# Patient Record
Sex: Female | Born: 1985 | Race: Black or African American | Hispanic: No | Smoking: Former smoker
Health system: Southern US, Community
[De-identification: ages and names within clinical notes are randomized; demographics above are authoritative.]

## PROBLEM LIST (undated history)

## (undated) ENCOUNTER — Ambulatory Visit: Source: Home / Self Care

## (undated) DIAGNOSIS — F419 Anxiety disorder, unspecified: Secondary | ICD-10-CM

## (undated) DIAGNOSIS — T7840XA Allergy, unspecified, initial encounter: Secondary | ICD-10-CM

## (undated) DIAGNOSIS — R112 Nausea with vomiting, unspecified: Secondary | ICD-10-CM

## (undated) DIAGNOSIS — T4145XA Adverse effect of unspecified anesthetic, initial encounter: Secondary | ICD-10-CM

## (undated) DIAGNOSIS — Z5189 Encounter for other specified aftercare: Secondary | ICD-10-CM

## (undated) DIAGNOSIS — G709 Myoneural disorder, unspecified: Secondary | ICD-10-CM

## (undated) DIAGNOSIS — R002 Palpitations: Secondary | ICD-10-CM

## (undated) DIAGNOSIS — F32A Depression, unspecified: Secondary | ICD-10-CM

## (undated) DIAGNOSIS — K219 Gastro-esophageal reflux disease without esophagitis: Secondary | ICD-10-CM

## (undated) DIAGNOSIS — D649 Anemia, unspecified: Secondary | ICD-10-CM

## (undated) DIAGNOSIS — J45909 Unspecified asthma, uncomplicated: Secondary | ICD-10-CM

## (undated) DIAGNOSIS — D689 Coagulation defect, unspecified: Secondary | ICD-10-CM

## (undated) DIAGNOSIS — T8859XA Other complications of anesthesia, initial encounter: Secondary | ICD-10-CM

## (undated) DIAGNOSIS — M1990 Unspecified osteoarthritis, unspecified site: Secondary | ICD-10-CM

## (undated) DIAGNOSIS — R011 Cardiac murmur, unspecified: Secondary | ICD-10-CM

## (undated) DIAGNOSIS — G43909 Migraine, unspecified, not intractable, without status migrainosus: Secondary | ICD-10-CM

## (undated) DIAGNOSIS — Z973 Presence of spectacles and contact lenses: Secondary | ICD-10-CM

## (undated) DIAGNOSIS — G971 Other reaction to spinal and lumbar puncture: Secondary | ICD-10-CM

## (undated) DIAGNOSIS — F4310 Post-traumatic stress disorder, unspecified: Secondary | ICD-10-CM

## (undated) DIAGNOSIS — IMO0002 Reserved for concepts with insufficient information to code with codable children: Secondary | ICD-10-CM

## (undated) DIAGNOSIS — Z9889 Other specified postprocedural states: Secondary | ICD-10-CM

## (undated) DIAGNOSIS — T7422XA Child sexual abuse, confirmed, initial encounter: Secondary | ICD-10-CM

## (undated) DIAGNOSIS — O88219 Thromboembolism in pregnancy, unspecified trimester: Secondary | ICD-10-CM

## (undated) HISTORY — DX: Allergy, unspecified, initial encounter: T78.40XA

## (undated) HISTORY — DX: Post-traumatic stress disorder, unspecified: F43.10

## (undated) HISTORY — DX: Child sexual abuse, confirmed, initial encounter: T74.22XA

## (undated) HISTORY — PX: MANDIBLE SURGERY: SHX707

## (undated) HISTORY — PX: ABDOMINAL HYSTERECTOMY: SHX81

## (undated) HISTORY — DX: Thromboembolism in pregnancy, unspecified trimester: O88.219

## (undated) HISTORY — DX: Anxiety disorder, unspecified: F41.9

## (undated) HISTORY — PX: WISDOM TOOTH EXTRACTION: SHX21

## (undated) HISTORY — DX: Coagulation defect, unspecified: D68.9

## (undated) HISTORY — DX: Depression, unspecified: F32.A

## (undated) HISTORY — DX: Myoneural disorder, unspecified: G70.9

## (undated) HISTORY — DX: Other complications of anesthesia, initial encounter: T88.59XA

## (undated) HISTORY — DX: Reserved for concepts with insufficient information to code with codable children: IMO0002

## (undated) HISTORY — DX: Other reaction to spinal and lumbar puncture: G97.1

## (undated) HISTORY — DX: Encounter for other specified aftercare: Z51.89

## (undated) HISTORY — PX: TUBAL LIGATION: SHX77

---
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
## 2004-08-20 ENCOUNTER — Emergency Department (HOSPITAL_COMMUNITY): Admission: EM | Admit: 2004-08-20 | Discharge: 2004-08-21 | Payer: Self-pay | Admitting: Emergency Medicine

## 2005-02-24 ENCOUNTER — Emergency Department (HOSPITAL_COMMUNITY): Admission: EM | Admit: 2005-02-24 | Discharge: 2005-02-24 | Payer: Self-pay | Admitting: Emergency Medicine

## 2005-05-06 ENCOUNTER — Emergency Department (HOSPITAL_COMMUNITY): Admission: EM | Admit: 2005-05-06 | Discharge: 2005-05-07 | Payer: Self-pay | Admitting: Emergency Medicine

## 2005-06-26 ENCOUNTER — Inpatient Hospital Stay (HOSPITAL_COMMUNITY): Admission: AD | Admit: 2005-06-26 | Discharge: 2005-06-27 | Payer: Self-pay | Admitting: *Deleted

## 2005-06-26 ENCOUNTER — Emergency Department (HOSPITAL_COMMUNITY): Admission: EM | Admit: 2005-06-26 | Discharge: 2005-06-26 | Payer: Self-pay | Admitting: Emergency Medicine

## 2006-02-11 ENCOUNTER — Emergency Department (HOSPITAL_COMMUNITY): Admission: EM | Admit: 2006-02-11 | Discharge: 2006-02-12 | Payer: Self-pay | Admitting: Emergency Medicine

## 2006-04-18 ENCOUNTER — Emergency Department (HOSPITAL_COMMUNITY): Admission: EM | Admit: 2006-04-18 | Discharge: 2006-04-18 | Payer: Self-pay | Admitting: Emergency Medicine

## 2006-06-11 ENCOUNTER — Emergency Department (HOSPITAL_COMMUNITY): Admission: EM | Admit: 2006-06-11 | Discharge: 2006-06-11 | Payer: Self-pay | Admitting: *Deleted

## 2006-06-22 ENCOUNTER — Ambulatory Visit (HOSPITAL_COMMUNITY): Admission: RE | Admit: 2006-06-22 | Discharge: 2006-06-22 | Payer: Self-pay | Admitting: Family Medicine

## 2006-11-27 ENCOUNTER — Ambulatory Visit: Payer: Self-pay | Admitting: *Deleted

## 2006-11-27 ENCOUNTER — Inpatient Hospital Stay (HOSPITAL_COMMUNITY): Admission: AD | Admit: 2006-11-27 | Discharge: 2006-11-29 | Payer: Self-pay | Admitting: Obstetrics & Gynecology

## 2008-12-15 ENCOUNTER — Emergency Department (HOSPITAL_COMMUNITY): Admission: EM | Admit: 2008-12-15 | Discharge: 2008-12-16 | Payer: Self-pay | Admitting: Emergency Medicine

## 2009-08-26 ENCOUNTER — Emergency Department (HOSPITAL_COMMUNITY): Admission: EM | Admit: 2009-08-26 | Discharge: 2009-08-26 | Payer: Self-pay | Admitting: Emergency Medicine

## 2010-03-12 ENCOUNTER — Emergency Department (HOSPITAL_COMMUNITY): Admission: EM | Admit: 2010-03-12 | Discharge: 2010-03-13 | Payer: Self-pay | Admitting: Emergency Medicine

## 2010-10-31 DIAGNOSIS — G971 Other reaction to spinal and lumbar puncture: Secondary | ICD-10-CM

## 2010-10-31 HISTORY — DX: Other reaction to spinal and lumbar puncture: G97.1

## 2010-11-14 ENCOUNTER — Emergency Department (HOSPITAL_COMMUNITY)
Admission: EM | Admit: 2010-11-14 | Discharge: 2010-11-15 | Payer: Self-pay | Source: Home / Self Care | Admitting: Emergency Medicine

## 2010-11-17 LAB — STREP A DNA PROBE: Group A Strep Probe: NEGATIVE

## 2010-11-17 LAB — RAPID STREP SCREEN (MED CTR MEBANE ONLY): Streptococcus, Group A Screen (Direct): NEGATIVE

## 2011-01-18 LAB — GLUCOSE, CAPILLARY: Glucose-Capillary: 79 mg/dL (ref 70–99)

## 2011-01-18 LAB — URINALYSIS, ROUTINE W REFLEX MICROSCOPIC
Bilirubin Urine: NEGATIVE
Glucose, UA: NEGATIVE mg/dL
Ketones, ur: NEGATIVE mg/dL
Nitrite: NEGATIVE
Protein, ur: NEGATIVE mg/dL
Specific Gravity, Urine: 1.027 (ref 1.005–1.030)
Urobilinogen, UA: 0.2 mg/dL (ref 0.0–1.0)
pH: 6.5 (ref 5.0–8.0)

## 2011-01-18 LAB — URINE MICROSCOPIC-ADD ON

## 2011-01-18 LAB — POCT PREGNANCY, URINE: Preg Test, Ur: NEGATIVE

## 2011-01-31 ENCOUNTER — Inpatient Hospital Stay (HOSPITAL_COMMUNITY)
Admission: AD | Admit: 2011-01-31 | Discharge: 2011-02-01 | Disposition: A | Discharge status: Home / Self Care | Payer: Medicaid Other | Source: Ambulatory Visit | Attending: Obstetrics and Gynecology | Admitting: Obstetrics and Gynecology

## 2011-01-31 ENCOUNTER — Inpatient Hospital Stay (HOSPITAL_COMMUNITY): Payer: Medicaid Other

## 2011-01-31 DIAGNOSIS — O99891 Other specified diseases and conditions complicating pregnancy: Secondary | ICD-10-CM

## 2011-01-31 DIAGNOSIS — O9989 Other specified diseases and conditions complicating pregnancy, childbirth and the puerperium: Secondary | ICD-10-CM

## 2011-01-31 DIAGNOSIS — R109 Unspecified abdominal pain: Secondary | ICD-10-CM

## 2011-01-31 LAB — URINALYSIS, ROUTINE W REFLEX MICROSCOPIC
Bilirubin Urine: NEGATIVE
Glucose, UA: NEGATIVE mg/dL
Ketones, ur: NEGATIVE mg/dL
Leukocytes, UA: NEGATIVE
Nitrite: NEGATIVE
Protein, ur: NEGATIVE mg/dL
Specific Gravity, Urine: 1.02 (ref 1.005–1.030)
Urobilinogen, UA: 0.2 mg/dL (ref 0.0–1.0)
pH: 7 (ref 5.0–8.0)

## 2011-01-31 LAB — WET PREP, GENITAL
Trich, Wet Prep: NONE SEEN
Yeast Wet Prep HPF POC: NONE SEEN

## 2011-01-31 LAB — URINE MICROSCOPIC-ADD ON

## 2011-01-31 LAB — HCG, QUANTITATIVE, PREGNANCY: hCG, Beta Chain, Quant, S: 60759 m[IU]/mL — ABNORMAL HIGH (ref <5)

## 2011-01-31 LAB — POCT PREGNANCY, URINE: Preg Test, Ur: POSITIVE

## 2011-02-01 LAB — GC/CHLAMYDIA PROBE AMP, GENITAL
Chlamydia, DNA Probe: NEGATIVE
GC Probe Amp, Genital: NEGATIVE

## 2011-02-15 LAB — POCT I-STAT, CHEM 8
BUN: 11 mg/dL (ref 6–23)
Calcium, Ion: 1.1 mmol/L — ABNORMAL LOW (ref 1.12–1.32)
Creatinine, Ser: 1 mg/dL (ref 0.4–1.2)
TCO2: 21 mmol/L (ref 0–100)

## 2011-02-15 LAB — URINALYSIS, ROUTINE W REFLEX MICROSCOPIC
Bilirubin Urine: NEGATIVE
Glucose, UA: NEGATIVE mg/dL
Hgb urine dipstick: NEGATIVE
Ketones, ur: NEGATIVE mg/dL
Nitrite: NEGATIVE
Protein, ur: NEGATIVE mg/dL
Specific Gravity, Urine: 1.028 (ref 1.005–1.030)
Urobilinogen, UA: 0.2 mg/dL (ref 0.0–1.0)
pH: 6.5 (ref 5.0–8.0)

## 2011-02-15 LAB — URINE CULTURE
Colony Count: NO GROWTH
Culture: NO GROWTH

## 2011-02-15 LAB — COMPREHENSIVE METABOLIC PANEL
ALT: 20 U/L (ref 0–35)
AST: 23 U/L (ref 0–37)
Albumin: 4.1 g/dL (ref 3.5–5.2)
CO2: 25 mEq/L (ref 19–32)
Calcium: 9.2 mg/dL (ref 8.4–10.5)
GFR calc Af Amer: >60 mL/min (ref >60)
Sodium: 137 mEq/L (ref 135–145)
Total Protein: 6.7 g/dL (ref 6.0–8.3)

## 2011-02-15 LAB — CBC
MCHC: 33.3 g/dL (ref 30.0–36.0)
Platelets: 217 10*3/uL (ref 150–400)
RBC: 4.07 MIL/uL (ref 3.87–5.11)
RDW: 13.7 % (ref 11.5–15.5)

## 2011-02-15 LAB — DIFFERENTIAL
Eosinophils Absolute: 0.2 10*3/uL (ref 0.0–0.7)
Eosinophils Relative: 3 % (ref 0–5)
Lymphs Abs: 2.7 10*3/uL (ref 0.7–4.0)
Monocytes Absolute: 0.5 10*3/uL (ref 0.1–1.0)
Monocytes Relative: 7 % (ref 3–12)

## 2011-02-15 LAB — GC/CHLAMYDIA PROBE AMP, GENITAL
Chlamydia, DNA Probe: POSITIVE — AB
GC Probe Amp, Genital: NEGATIVE

## 2011-02-15 LAB — WET PREP, GENITAL: Trich, Wet Prep: NONE SEEN

## 2011-02-15 LAB — URINE MICROSCOPIC-ADD ON

## 2011-02-15 LAB — POCT PREGNANCY, URINE: Preg Test, Ur: NEGATIVE

## 2011-03-05 ENCOUNTER — Inpatient Hospital Stay (HOSPITAL_COMMUNITY)
Admission: AD | Admit: 2011-03-05 | Discharge: 2011-03-05 | Disposition: A | Discharge status: Home / Self Care | Payer: Medicaid Other | Source: Ambulatory Visit | Attending: Obstetrics & Gynecology | Admitting: Obstetrics & Gynecology

## 2011-03-05 DIAGNOSIS — O209 Hemorrhage in early pregnancy, unspecified: Secondary | ICD-10-CM

## 2011-03-05 LAB — WET PREP, GENITAL: Trich, Wet Prep: NONE SEEN

## 2011-03-05 LAB — URINALYSIS, ROUTINE W REFLEX MICROSCOPIC
Bilirubin Urine: NEGATIVE
Nitrite: NEGATIVE
Protein, ur: NEGATIVE mg/dL
Specific Gravity, Urine: <1.005 — ABNORMAL LOW (ref 1.005–1.030)
Urobilinogen, UA: 0.2 mg/dL (ref 0.0–1.0)

## 2011-03-05 LAB — URINE MICROSCOPIC-ADD ON

## 2012-06-09 ENCOUNTER — Emergency Department (HOSPITAL_COMMUNITY): Payer: Self-pay

## 2012-06-09 ENCOUNTER — Emergency Department (HOSPITAL_COMMUNITY)
Admission: EM | Admit: 2012-06-09 | Discharge: 2012-06-09 | Disposition: A | Discharge status: Home / Self Care | Payer: Self-pay | Attending: Emergency Medicine | Admitting: Emergency Medicine

## 2012-06-09 ENCOUNTER — Encounter (HOSPITAL_COMMUNITY): Payer: Self-pay | Admitting: Emergency Medicine

## 2012-06-09 DIAGNOSIS — S93601A Unspecified sprain of right foot, initial encounter: Secondary | ICD-10-CM

## 2012-06-09 DIAGNOSIS — S93699A Other sprain of unspecified foot, initial encounter: Secondary | ICD-10-CM | POA: Insufficient documentation

## 2012-06-09 DIAGNOSIS — S8990XA Unspecified injury of unspecified lower leg, initial encounter: Secondary | ICD-10-CM | POA: Insufficient documentation

## 2012-06-09 DIAGNOSIS — S8991XA Unspecified injury of right lower leg, initial encounter: Secondary | ICD-10-CM

## 2012-06-09 DIAGNOSIS — S99929A Unspecified injury of unspecified foot, initial encounter: Secondary | ICD-10-CM | POA: Insufficient documentation

## 2012-06-09 DIAGNOSIS — W010XXA Fall on same level from slipping, tripping and stumbling without subsequent striking against object, initial encounter: Secondary | ICD-10-CM | POA: Insufficient documentation

## 2012-06-09 MED ORDER — TRAMADOL HCL 50 MG PO TABS: 50.0000 mg | ORAL_TABLET | Freq: Four times a day (QID) | ORAL | Status: AC | PRN

## 2012-06-09 MED ORDER — OXYCODONE-ACETAMINOPHEN 5-325 MG PO TABS
1.0000 | ORAL_TABLET | Freq: Once | ORAL | Status: AC
  Administered 2012-06-09: 1 via ORAL
  Filled 2012-06-09: qty 1

## 2012-06-09 NOTE — ED Notes (Signed)
Pt states she fell on Friday evening and slipped on some water on the floor  Pt states she fell and injured her right foot and knee  Pt states she landed mainly on her right side but her knee and foot broke her fall  Pt states she has some pain in her hip as well  Pt has swelling noted to her right foot and states she is unable to move her toes # 3,4, and 5

## 2012-06-09 NOTE — ED Provider Notes (Signed)
History     CSN: [id number]  Arrival date & time 06/09/12  0047   First MD Initiated Contact with Patient 06/09/12 [phone]      Chief Complaint  Patient presents with  . Fall    (Consider location/radiation/quality/duration/timing/severity/associated sxs/prior treatment) HPI  26 year old female presents for evaluations of right foot injury. Patient reports she accidentally slipped on a wet floor today. She fell forward injuring her right knee and her right foot as it broke the fall. She denies hitting her head or loss of consciousness. She was able to ambulate shortly afterward but complaining of sharp shooting pain from her foot in her knee which radiates up.  Unable to move her toes due to sharp shooting pain. Denies any significant ankle pain. Denies bleeding. Has not tried anything to alleviate her symptoms yet.  History reviewed. No pertinent past medical history.  Past Surgical History  Procedure Date  . Mandible surgery     Family History  Problem Relation Age of Onset  . Hypertension Other   . Diabetes Other   . Hyperlipidemia Other   . Coronary artery disease Other   . Arthritis Other   . Cancer Other     History  Substance Use Topics  . Smoking status: Never Smoker   . Smokeless tobacco: Not on file  . Alcohol Use: No    OB History    Grav Para Term Preterm Abortions TAB SAB Ect Mult Living                  Review of Systems  Constitutional: Negative for fever.  Cardiovascular: Negative for chest pain.  Gastrointestinal: Negative for abdominal pain.  Musculoskeletal: Negative for back pain.  Skin: Negative for wound.  Neurological: Negative for headaches.  All other systems reviewed and are negative.    Allergies  Ibuprofen and Other  Home Medications   Current Outpatient Rx  Name Route Sig Dispense Refill  . ASPIRIN 81 MG PO TABS Oral Take 81 mg by mouth daily.    Marland Kitchen MEDROXYPROGESTERONE ACETATE 150 MG/ML IM SUSP Intramuscular Inject 150 mg  into the muscle every 3 (three) months.      BP 112/70  Pulse 86  Temp 97.8 F (36.6 C) (Oral)  Resp 16  SpO2 99%  Physical Exam  Nursing note and vitals reviewed. Constitutional: She is oriented to person, place, and time. She appears well-developed and well-nourished. No distress.  Eyes: Conjunctivae are normal.  Neck: Normal range of motion. Neck supple.  Musculoskeletal:       Right hip: Normal.       Right knee: She exhibits decreased range of motion. She exhibits no swelling, no effusion, no ecchymosis, no deformity and no laceration. tenderness found. No medial joint line and no lateral joint line tenderness noted.       Right ankle: Normal.       Right foot: She exhibits decreased range of motion, tenderness and bony tenderness. She exhibits no swelling, normal capillary refill and no crepitus.       Feet:  Neurological: She is alert and oriented to person, place, and time.  Skin: Skin is warm. No rash noted.    ED Course  Procedures (including critical care time)  Labs Reviewed - No data to display Dg Foot Complete Right  06/09/2012  *RADIOLOGY REPORT*  Clinical Data: Pain  RIGHT FOOT COMPLETE - 3+ VIEW  Comparison: None.  Findings: No fracture or dislocation of the right foot.  Joint spaces well  maintained  IMPRESSION:  No right foot fracture.  Original Report Authenticated By: Genevive Bi, M.D.  Dg Foot Complete Right  06/09/2012  *RADIOLOGY REPORT*  Clinical Data: Pain  RIGHT FOOT COMPLETE - 3+ VIEW  Comparison: None.  Findings: No fracture or dislocation of the right foot.  Joint spaces well maintained  IMPRESSION:  No right foot fracture.  Original Report Authenticated By: Genevive Bi, M.D.     1. Right foot sprain 2. Right knee injury   MDM  Mechanical fall. Tenderness to third fourth and fifth right toes. Ecchymosis noted to dorsum of 4th toe without deformity. No ankle tenderness. R Knee is nontender to the anterior aspects of patella without  deformity. Normal range of motion.  Patient does not want to ambulate due to pain.  2:39 AM Xray of R foot unremarkable.  Will give knee sleeve, post op boot, and crutches for support.  Pain medication given.         Fayrene Helper, PA-C 06/09/12 [phone]

## 2012-06-10 NOTE — ED Provider Notes (Signed)
Medical screening examination/treatment/procedure(s) were performed by non-physician practitioner and as supervising physician I was immediately available for consultation/collaboration.  Raeford Razor, MD 06/10/12 [phone]

## 2012-10-31 HISTORY — PX: HERNIA REPAIR: SHX51

## 2018-10-31 DIAGNOSIS — O88219 Thromboembolism in pregnancy, unspecified trimester: Secondary | ICD-10-CM

## 2018-10-31 HISTORY — DX: Thromboembolism in pregnancy, unspecified trimester: O88.219

## 2018-10-31 NOTE — L&D Delivery Note (Signed)
OB/GYN Faculty Practice Delivery Note  Katrenia Copenhaver is a 33 y.o. [id number] s/p VD at [redacted]w[redacted]d. She was admitted for IOL for hx of IUFD.   ROM: 0h 29m with clear fluid GBS Status: --Henderson Cloud (12/09 1006) Maximum Maternal Temperature: 98.5 F  Labor Progress: . Patient presented to L&D for IOL for hx of IUFD. Initial SVE: 1/thick/-3. Patient received Foley bulb, Cytotec and Pitocin. Received epidural. SROM occurred. She then progressed to complete.   Delivery Date/Time: 12/31 @ 2032 Delivery: Called to room and patient was complete. Infant delivered within 3 contractions. Head delivered in LOA position. Loose nuchal cord present and easily reduced. Shoulder and body delivered in usual fashion. Infant with spontaneous cry, placed on mother's abdomen, dried and stimulated. Cord clamped x 2 after 1-minute delay, and cut by FOB. Cord blood drawn. Pitocin initiated. Placenta delivered spontaneously with gentle cord traction. Inspected and found to be intact with 3V cord. Continued bleeding noted. Uterine sweep done with small membranes removed. Fundus firm but lower uterine segment boggy. Methergine given. Due to continued bleeding TXA and rectal Cytotec given. Dr. Roselie Awkward called to room to assess as well. Uterine sweep performed by Dr. Roselie Awkward and only clot removed. Placenta inspected again and found to appear intact. 2nd IV started and IVF bolus initiated. Labs drawn. Bakri placed and filled with 500 mL and then packed with one lap thereafter with bleeding stable. Will given second dose TXA, second bag of Pitocin and Methergine series. Will given 1 unit FFP, 2 u RBC's and 1 unit platelets. Will monitor vitals closely and follow-up pending labs.  Baby Weight: pending  Placenta: Sent to L&D Complications: PPH with Bakri placement Lacerations: None EBL: 1995 mL Analgesia: Epidural   Infant: APGAR (1 MIN): 9   APGAR (5 MINS): 9   APGAR (10 MINS):     Barrington Ellison, MD [facility] Family Medicine Fellow,  [facility] 10/31/2019, 9:24 PM

## 2019-03-01 LAB — CYTOLOGY - PAP: Pap Smear: NEGATIVE

## 2019-03-12 LAB — OB RESULTS CONSOLE VARICELLA ZOSTER ANTIBODY, IGG: Varicella: IMMUNE

## 2019-03-12 LAB — OB RESULTS CONSOLE RUBELLA ANTIBODY, IGM: Rubella: IMMUNE

## 2019-03-12 LAB — OB RESULTS CONSOLE ABO/RH: RH Type: POSITIVE

## 2019-03-12 LAB — OB RESULTS CONSOLE ANTIBODY SCREEN: Antibody Screen: NEGATIVE

## 2019-03-12 LAB — OB RESULTS CONSOLE PLATELET COUNT: Platelets: 243

## 2019-03-12 LAB — OB RESULTS CONSOLE GC/CHLAMYDIA
Chlamydia: NEGATIVE
Gonorrhea: NEGATIVE

## 2019-03-12 LAB — HEPATITIS B SURFACE ANTIBODY,QUALITATIVE
Hemoglobin A1C: 4.9
Hepatitis B Virus DNA Qual: REACTIVE

## 2019-03-12 LAB — OB RESULTS CONSOLE HEPATITIS B SURFACE ANTIGEN: Hepatitis B Surface Ag: NEGATIVE

## 2019-03-12 LAB — OB RESULTS CONSOLE HGB/HCT, BLOOD
HCT: 35 (ref 29–41)
Hemoglobin: 12.5

## 2019-03-12 LAB — OB RESULTS CONSOLE RPR: RPR: NONREACTIVE

## 2019-03-12 LAB — OB RESULTS CONSOLE HIV ANTIBODY (ROUTINE TESTING): HIV: NONREACTIVE

## 2019-04-22 LAB — URINE CULTURE: Urine Culture, OB: NEGATIVE

## 2019-07-07 ENCOUNTER — Emergency Department (HOSPITAL_COMMUNITY)
Admission: EM | Admit: 2019-07-07 | Discharge: 2019-07-07 | Disposition: A | Discharge status: Home / Self Care | Payer: Medicaid Other | Attending: Emergency Medicine | Admitting: Emergency Medicine

## 2019-07-07 ENCOUNTER — Other Ambulatory Visit: Payer: Self-pay

## 2019-07-07 ENCOUNTER — Encounter (HOSPITAL_COMMUNITY): Payer: Self-pay

## 2019-07-07 DIAGNOSIS — Z3A22 22 weeks gestation of pregnancy: Secondary | ICD-10-CM | POA: Insufficient documentation

## 2019-07-07 DIAGNOSIS — O9989 Other specified diseases and conditions complicating pregnancy, childbirth and the puerperium: Secondary | ICD-10-CM | POA: Diagnosis present

## 2019-07-07 DIAGNOSIS — R102 Pelvic and perineal pain unspecified side: Secondary | ICD-10-CM

## 2019-07-07 DIAGNOSIS — Z7982 Long term (current) use of aspirin: Secondary | ICD-10-CM | POA: Insufficient documentation

## 2019-07-07 DIAGNOSIS — O26892 Other specified pregnancy related conditions, second trimester: Secondary | ICD-10-CM | POA: Diagnosis not present

## 2019-07-07 DIAGNOSIS — O26899 Other specified pregnancy related conditions, unspecified trimester: Secondary | ICD-10-CM

## 2019-07-07 DIAGNOSIS — Z79899 Other long term (current) drug therapy: Secondary | ICD-10-CM | POA: Diagnosis not present

## 2019-07-07 LAB — CBC WITH DIFFERENTIAL/PLATELET
Abs Immature Granulocytes: 0.07 10*3/uL (ref 0.00–0.07)
Basophils Absolute: 0 10*3/uL (ref 0.0–0.1)
Basophils Relative: 0 %
Eosinophils Absolute: 0.2 10*3/uL (ref 0.0–0.5)
Eosinophils Relative: 2 %
HCT: 33 % — ABNORMAL LOW (ref 36.0–46.0)
Hemoglobin: 11.1 g/dL — ABNORMAL LOW (ref 12.0–15.0)
Immature Granulocytes: 1 %
Lymphocytes Relative: 21 %
Lymphs Abs: 1.7 10*3/uL (ref 0.7–4.0)
MCH: 31.1 pg (ref 26.0–34.0)
MCHC: 33.6 g/dL (ref 30.0–36.0)
MCV: 92.4 fL (ref 80.0–100.0)
Monocytes Absolute: 0.7 10*3/uL (ref 0.1–1.0)
Monocytes Relative: 8 %
Neutro Abs: 5.6 10*3/uL (ref 1.7–7.7)
Neutrophils Relative %: 68 %
Platelets: 154 10*3/uL (ref 150–400)
RBC: 3.57 MIL/uL — ABNORMAL LOW (ref 3.87–5.11)
RDW: 12.8 % (ref 11.5–15.5)
WBC: 8.2 10*3/uL (ref 4.0–10.5)
nRBC: 0 % (ref 0.0–0.2)

## 2019-07-07 LAB — COMPREHENSIVE METABOLIC PANEL
ALT: 11 U/L (ref 0–44)
AST: 14 U/L — ABNORMAL LOW (ref 15–41)
Albumin: 3 g/dL — ABNORMAL LOW (ref 3.5–5.0)
Alkaline Phosphatase: 36 U/L — ABNORMAL LOW (ref 38–126)
Anion gap: 10 (ref 5–15)
BUN: 9 mg/dL (ref 6–20)
CO2: 22 mmol/L (ref 22–32)
Calcium: 9.2 mg/dL (ref 8.9–10.3)
Chloride: 104 mmol/L (ref 98–111)
Creatinine, Ser: 0.47 mg/dL (ref 0.44–1.00)
GFR calc Af Amer: >60 mL/min (ref >60)
GFR calc non Af Amer: >60 mL/min (ref >60)
Glucose, Bld: 93 mg/dL (ref 70–99)
Potassium: 3.7 mmol/L (ref 3.5–5.1)
Sodium: 136 mmol/L (ref 135–145)
Total Bilirubin: 0.2 mg/dL — ABNORMAL LOW (ref 0.3–1.2)
Total Protein: 6.2 g/dL — ABNORMAL LOW (ref 6.5–8.1)

## 2019-07-07 LAB — URINALYSIS, ROUTINE W REFLEX MICROSCOPIC
Bilirubin Urine: NEGATIVE
Glucose, UA: NEGATIVE mg/dL
Ketones, ur: NEGATIVE mg/dL
Leukocytes,Ua: NEGATIVE
Nitrite: NEGATIVE
Protein, ur: NEGATIVE mg/dL
Specific Gravity, Urine: 1.005 (ref 1.005–1.030)
pH: 6 (ref 5.0–8.0)

## 2019-07-07 MED ORDER — SODIUM CHLORIDE 0.9 % IV BOLUS
1000.0000 mL | Freq: Once | INTRAVENOUS | Status: AC
  Administered 2019-07-07: 06:00:00 1000 mL via INTRAVENOUS

## 2019-07-07 MED ORDER — ACETAMINOPHEN 325 MG PO TABS
650.0000 mg | ORAL_TABLET | Freq: Once | ORAL | Status: AC
  Administered 2019-07-07: 650 mg via ORAL
  Filled 2019-07-07: qty 2

## 2019-07-07 NOTE — ED Notes (Signed)
Pt was verbalized discharge instructions. Pt had no further questions at this time. NAD. 

## 2019-07-07 NOTE — Discharge Instructions (Signed)
Please go straight to the MAU assessment at [facility]. We have discussed your case with Dr. Ilda Basset, who recommends that you would benefit with urgent evaluation by them today.

## 2019-07-07 NOTE — ED Triage Notes (Addendum)
Pt reports pelvic pain and pressure that started about an hour ago. Pt is [redacted] weeks pregnant. Denies any bleeding. States that her pregnancy is high risk.

## 2019-07-07 NOTE — ED Provider Notes (Addendum)
[facility] DEPT Provider Note   CSN: [id number] Arrival date & time: 07/07/19  0401     History   Chief Complaint Chief Complaint  Patient presents with  . Pelvic Pain    [redacted] weeks pregnant    HPI Meghan Reed is a 33 y.o. female.     HPI G7, P2 female comes in a chief complaint of pelvic pain.  Patient is about [redacted] weeks pregnant.  She just moved here from [place] and does not have an established OB in [place].  She reports that around 2 AM she woke up with pelvic pain.  The pain is actually generalized over her abdomen anteriorly, but most intense pain is in the pelvic area and it is described as tightness type pain.  She also has some back pain.  She reports that her grandmother recently was in hospice and passed away yesterday, so she has been stressed because of it.  She denies any UTI-like symptoms, fevers, chills, trauma.  Additionally, patient states that early in her pregnancy she had spotting and bleeding.  She is deemed as high risk pregnancy given her early bleeding and also multiple miscarriages and fetal demise.   History reviewed. No pertinent past medical history.  There are no active problems to display for this patient.   Past Surgical History:  Procedure Laterality Date  . MANDIBLE SURGERY       OB History   No obstetric history on file.      Home Medications    Prior to Admission medications   Medication Sig Start Date End Date Taking? Authorizing Provider  acetaminophen (TYLENOL) 500 MG tablet Take 500 mg by mouth every 6 (six) hours as needed for moderate pain.   Yes [provider]  aspirin EC 81 MG tablet Take 81 mg by mouth daily.   Yes [provider]  Prenatal MV-Min-FA-Omega-3 (PRENATAL GUMMIES/DHA & FA) 0.4-32.5 MG CHEW Chew 1 tablet by mouth daily.   Yes [provider]    Family History Family History  Problem Relation Age of Onset  . Hypertension Other   . Diabetes  Other   . Hyperlipidemia Other   . Coronary artery disease Other   . Arthritis Other   . Cancer Other     Social History Social History   Tobacco Use  . Smoking status: Never Smoker  Substance Use Topics  . Alcohol use: No  . Drug use: No     Allergies   Ibuprofen and Other   Review of Systems Review of Systems  Constitutional: Positive for activity change.  Cardiovascular: Negative for chest pain.  Gastrointestinal: Positive for abdominal pain. Negative for nausea and vomiting.  Genitourinary: Negative for dysuria.  Musculoskeletal: Positive for back pain.  All other systems reviewed and are negative.    Physical Exam Updated Vital Signs BP 92/65 (BP Location: Right Arm)   Pulse 68   Temp 98.5 F (36.9 C) (Oral)   Resp 16   Ht 5\' 6"  (1.676 m)   Wt 93 kg   SpO2 100%   BMI 33.09 kg/m   Physical Exam Vitals signs and nursing note reviewed.  Constitutional:      Appearance: She is well-developed.  HENT:     Head: Normocephalic and atraumatic.  Neck:     Musculoskeletal: Normal range of motion and neck supple.  Cardiovascular:     Rate and Rhythm: Normal rate.  Pulmonary:     Effort: Pulmonary effort is normal.  Abdominal:     General: Bowel sounds are normal.     Tenderness: There is abdominal tenderness. There is no guarding or rebound.     Comments: Generalized abdominal tenderness.  Skin:    General: Skin is warm and dry.  Neurological:     Mental Status: She is alert and oriented to person, place, and time.      ED Treatments / Results  Labs (all labs ordered are listed, but only abnormal results are displayed) Labs Reviewed  COMPREHENSIVE METABOLIC PANEL - Abnormal; Notable for the following components:      Result Value   Total Protein 6.2 (*)    Albumin 3.0 (*)    AST 14 (*)    Alkaline Phosphatase 36 (*)    Total Bilirubin 0.2 (*)    All other components within normal limits  CBC WITH DIFFERENTIAL/PLATELET - Abnormal; Notable for  the following components:   RBC 3.57 (*)    Hemoglobin 11.1 (*)    HCT 33.0 (*)    All other components within normal limits  URINALYSIS, ROUTINE W REFLEX MICROSCOPIC - Abnormal; Notable for the following components:   Color, Urine STRAW (*)    Hgb urine dipstick SMALL (*)    Bacteria, UA RARE (*)    All other components within normal limits  TYPE AND SCREEN    EKG None  Radiology No results found.  Procedures Procedures (including critical care time)  Medications Ordered in ED Medications  sodium chloride 0.9 % bolus 1,000 mL (0 mLs Intravenous Stopped 07/07/19 0646)  acetaminophen (TYLENOL) tablet 650 mg (650 mg Oral Given 07/07/19 0530)     Initial Impression / Assessment and Plan / ED Course  I have reviewed the triage vital signs and the nursing notes.  Pertinent labs & imaging results that were available during my care of the patient were reviewed by me and considered in my medical decision making (see chart for details).  Clinical Course as of Jul 06 717  Sun Jul 07, 2019  0633 All of patient's labs are reassuring.  Repeat abdominal exam is unchanged.  Suspect that the pain is secondary to round ligament, however symptoms could be also because of early labor (as she reports she has had similar symptoms in the past prior to her miscarriages and fetal demise) and placental issues.  I spoke with women's faculty on call.  They recommend that it would be best for patient to come to MAU and get further assessment and pelvic exam.  They would not want [place] to proceed with pelvic exam as it would help them get the appropriate swabs and testing.  Recommendations from OB have been discussed with the patient and I have advised him to drive straight to [facility] at [facility].  They concur.   [AN]  0643 Pain is improved to 5 out of 10 at this time.  Hemodynamically she is stable.  All the labs are reassuring.  UA is not showing any concerning findings.  She will be discharged from  [facility] long with a request to go straight to MAU.   [AN]    Clinical Course User Index [AN] Varney Biles, MD       33 year old comes in a chief complaint of pelvic pain.  She is G7, P2 patient was [redacted] weeks pregnant and deemed to be high risk pregnancy.  She reports that her pain started at 2 AM, and is primarily located in the pelvic region.  On exam patient has no  focal abdominal tenderness.  The tenderness appears to be worse than the left quadrants.  Patient does not have any signs of peritonitis.  She denies any vaginal bleeding, and the pain does not appear to be cramping/contraction type pain.  We will get basic labs.  Hydration started.  Patient will be given Tylenol.  Final Clinical Impressions(s) / ED Diagnoses   Final diagnoses:  Pelvic pain in female  Pregnancy related abdominal pain of lower quadrant, antepartum    ED Discharge Orders    None       Varney Biles, MD 07/07/19 [place], Brice, MD 07/07/19 [phone]

## 2019-07-11 ENCOUNTER — Encounter (HOSPITAL_COMMUNITY): Payer: Self-pay

## 2019-07-11 ENCOUNTER — Other Ambulatory Visit: Payer: Self-pay

## 2019-07-11 ENCOUNTER — Inpatient Hospital Stay (HOSPITAL_COMMUNITY)
Admission: AD | Admit: 2019-07-11 | Discharge: 2019-07-11 | Disposition: A | Discharge status: Home / Self Care | Payer: Medicaid Other | Attending: Obstetrics and Gynecology | Admitting: Obstetrics and Gynecology

## 2019-07-11 DIAGNOSIS — M5432 Sciatica, left side: Secondary | ICD-10-CM

## 2019-07-11 DIAGNOSIS — O26892 Other specified pregnancy related conditions, second trimester: Secondary | ICD-10-CM | POA: Diagnosis present

## 2019-07-11 DIAGNOSIS — J45909 Unspecified asthma, uncomplicated: Secondary | ICD-10-CM | POA: Insufficient documentation

## 2019-07-11 DIAGNOSIS — M7918 Myalgia, other site: Secondary | ICD-10-CM | POA: Diagnosis not present

## 2019-07-11 DIAGNOSIS — O99512 Diseases of the respiratory system complicating pregnancy, second trimester: Secondary | ICD-10-CM | POA: Insufficient documentation

## 2019-07-11 DIAGNOSIS — N949 Unspecified condition associated with female genital organs and menstrual cycle: Secondary | ICD-10-CM

## 2019-07-11 DIAGNOSIS — R102 Pelvic and perineal pain: Secondary | ICD-10-CM | POA: Insufficient documentation

## 2019-07-11 DIAGNOSIS — Z3A23 23 weeks gestation of pregnancy: Secondary | ICD-10-CM

## 2019-07-11 DIAGNOSIS — R1031 Right lower quadrant pain: Secondary | ICD-10-CM | POA: Diagnosis not present

## 2019-07-11 DIAGNOSIS — Z87891 Personal history of nicotine dependence: Secondary | ICD-10-CM | POA: Diagnosis not present

## 2019-07-11 DIAGNOSIS — Z7982 Long term (current) use of aspirin: Secondary | ICD-10-CM | POA: Diagnosis not present

## 2019-07-11 HISTORY — DX: Anemia, unspecified: D64.9

## 2019-07-11 HISTORY — DX: Unspecified asthma, uncomplicated: J45.909

## 2019-07-11 LAB — CBC
HCT: 33.9 % — ABNORMAL LOW (ref 36.0–46.0)
Hemoglobin: 11.6 g/dL — ABNORMAL LOW (ref 12.0–15.0)
MCH: 30.9 pg (ref 26.0–34.0)
MCHC: 34.2 g/dL (ref 30.0–36.0)
MCV: 90.4 fL (ref 80.0–100.0)
Platelets: 165 10*3/uL (ref 150–400)
RBC: 3.75 MIL/uL — ABNORMAL LOW (ref 3.87–5.11)
RDW: 12.7 % (ref 11.5–15.5)
WBC: 9.1 10*3/uL (ref 4.0–10.5)
nRBC: 0 % (ref 0.0–0.2)

## 2019-07-11 LAB — URINALYSIS, ROUTINE W REFLEX MICROSCOPIC
Bilirubin Urine: NEGATIVE
Glucose, UA: NEGATIVE mg/dL
Ketones, ur: NEGATIVE mg/dL
Leukocytes,Ua: NEGATIVE
Nitrite: NEGATIVE
Protein, ur: NEGATIVE mg/dL
Specific Gravity, Urine: 1.026 (ref 1.005–1.030)
pH: 6 (ref 5.0–8.0)

## 2019-07-11 LAB — WET PREP, GENITAL
Clue Cells Wet Prep HPF POC: NONE SEEN
Sperm: NONE SEEN
Trich, Wet Prep: NONE SEEN
Yeast Wet Prep HPF POC: NONE SEEN

## 2019-07-11 MED ORDER — CYCLOBENZAPRINE HCL 10 MG PO TABS
10.0000 mg | ORAL_TABLET | Freq: Three times a day (TID) | ORAL | Status: DC | PRN
  Administered 2019-07-11: 10 mg via ORAL
  Filled 2019-07-11: qty 1

## 2019-07-11 MED ORDER — CYCLOBENZAPRINE HCL 10 MG PO TABS: 10.0000 mg | ORAL_TABLET | Freq: Three times a day (TID) | ORAL | 0 refills | Status: DC | PRN

## 2019-07-11 MED ORDER — COMFORT FIT MATERNITY SUPP LG MISC: 1.0000 "application " | Freq: Every day | 0 refills | Status: DC

## 2019-07-11 NOTE — MAU Note (Addendum)
Pt G7P2 at [redacted]w[redacted]d who just moved here from [place]. She has not established prenatal care yet. She is complaining of right-sided lower abdominal/pelvic pain.   Patient states it   feels like "my pelvis is separating" and "my stomach feels tight" She also now feels like she has left-sided sciatic pain.  Denies LOF or VB. Denies cramping or contractions. She was seen at [facility] ED early Sunday morning for this problem. She was told to find an OB or come here to MAU for further evaluation.

## 2019-07-11 NOTE — Discharge Instructions (Signed)
Round Ligament Pain  The round ligament is a cord of muscle and tissue that helps support the uterus. It can become a source of pain during pregnancy if it becomes stretched or twisted as the baby grows. The pain usually begins in the second trimester (13-28 weeks) of pregnancy, and it can come and go until the baby is delivered. It is not a serious problem, and it does not cause harm to the baby. Round ligament pain is usually a short, sharp, and pinching pain, but it can also be a dull, lingering, and aching pain. The pain is felt in the lower side of the abdomen or in the groin. It usually starts deep in the groin and moves up to the outside of the hip area. The pain may occur when you:  Suddenly change position, such as quickly going from a sitting to standing position.  Roll over in bed.  Cough or sneeze.  Do physical activity. Follow these instructions at home:   Watch your condition for any changes.  When the pain starts, relax. Then try any of these methods to help with the pain: ? Sitting down. ? Flexing your knees up to your abdomen. ? Lying on your side with one pillow under your abdomen and another pillow between your legs. ? Sitting in a warm bath for 15-20 minutes or until the pain goes away.  Take over-the-counter and prescription medicines only as told by your health care provider.  Move slowly when you sit down or stand up.  Avoid long walks if they cause pain.  Stop or reduce your physical activities if they cause pain.  Keep all follow-up visits as told by your health care provider. This is important. Contact a health care provider if:  Your pain does not go away with treatment.  You feel pain in your back that you did not have before.  Your medicine is not helping. Get help right away if:  You have a fever or chills.  You develop uterine contractions.  You have vaginal bleeding.  You have nausea or vomiting.  You have diarrhea.  You have pain  when you urinate. Summary  Round ligament pain is felt in the lower abdomen or groin. It is usually a short, sharp, and pinching pain. It can also be a dull, lingering, and aching pain.  This pain usually begins in the second trimester (13-28 weeks). It occurs because the uterus is stretching with the growing baby, and it is not harmful to the baby.  You may notice the pain when you suddenly change position, when you cough or sneeze, or during physical activity.  Relaxing, flexing your knees to your abdomen, lying on one side, or taking a warm bath may help to get rid of the pain.  Get help from your health care provider if the pain does not go away or if you have vaginal bleeding, nausea, vomiting, diarrhea, or painful urination. This information is not intended to replace advice given to you by your health care provider. Make sure you discuss any questions you have with your health care provider. Document Released: 07/26/2008 Document Revised: 04/04/2018 Document Reviewed: 04/04/2018 Elsevier Patient Education  2020 [place].   Sciatica  Sciatica is pain, weakness, tingling, or loss of feeling (numbness) along the sciatic nerve. The sciatic nerve starts in the lower back and goes down the back of each leg. Sciatica usually goes away on its own or with treatment. Sometimes, sciatica may come back (recur). What are the  causes? This condition happens when the sciatic nerve is pinched or has pressure put on it. This may be the result of:  A disk in between the bones of the spine bulging out too far (herniated disk).  Changes in the spinal disks that occur with aging.  A condition that affects a muscle in the butt.  Extra bone growth near the sciatic nerve.  A break (fracture) of the area between your hip bones (pelvis).  Pregnancy.  Tumor. This is rare. What increases the risk? You are more likely to develop this condition if you:  Play sports that put pressure or stress on  the spine.  Have poor strength and ease of movement (flexibility).  Have had a back injury in the past.  Have had back surgery.  Sit for long periods of time.  Do activities that involve bending or lifting over and over again.  Are very overweight (obese). What are the signs or symptoms? Symptoms can vary from mild to very bad. They may include:  Any of these problems in the lower back, leg, hip, or butt: ? Mild tingling, loss of feeling, or dull aches. ? Burning sensations. ? Sharp pains.  Loss of feeling in the back of the calf or the sole of the foot.  Leg weakness.  Very bad back pain that makes it hard to move. These symptoms may get worse when you cough, sneeze, or laugh. They may also get worse when you sit or stand for long periods of time. How is this treated? This condition often gets better without any treatment. However, treatment may include:  Changing or cutting back on physical activity when you have pain.  Doing exercises and stretching.  Putting ice or heat on the affected area.  Medicines that help: ? To relieve pain and swelling. ? To relax your muscles.  Shots (injections) of medicines that help to relieve pain, irritation, and swelling.  Surgery. Follow these instructions at home: Medicines  Take over-the-counter and prescription medicines only as told by your doctor.  Ask your doctor if the medicine prescribed to you: ? Requires you to avoid driving or using heavy machinery. ? Can cause trouble pooping (constipation). You may need to take these steps to prevent or treat trouble pooping:  Drink enough fluids to keep your pee (urine) pale yellow.  Take over-the-counter or prescription medicines.  Eat foods that are high in fiber. These include beans, whole grains, and fresh fruits and vegetables.  Limit foods that are high in fat and sugar. These include fried or sweet foods. Managing pain      If told, put ice on the affected  area. ? Put ice in a plastic bag. ? Place a towel between your skin and the bag. ? Leave the ice on for 20 minutes, 2-3 times a day.  If told, put heat on the affected area. Use the heat source that your doctor tells you to use, such as a moist heat pack or a heating pad. ? Place a towel between your skin and the heat source. ? Leave the heat on for 20-30 minutes. ? Remove the heat if your skin turns bright red. This is very important if you are unable to feel pain, heat, or cold. You may have a greater risk of getting burned. Activity   Return to your normal activities as told by your doctor. Ask your doctor what activities are safe for you.  Avoid activities that make your symptoms worse.  Take short rests  during the day. ? When you rest for a long time, do some physical activity or stretching between periods of rest. ? Avoid sitting for a long time without moving. Get up and move around at least one time each hour.  Exercise and stretch regularly, as told by your doctor.  Do not lift anything that is heavier than 10 lb (4.5 kg) while you have symptoms of sciatica. ? Avoid lifting heavy things even when you do not have symptoms. ? Avoid lifting heavy things over and over.  When you lift objects, always lift in a way that is safe for your body. To do this, you should: ? Bend your knees. ? Keep the object close to your body. ? Avoid twisting. General instructions  Stay at a healthy weight.  Wear comfortable shoes that support your feet. Avoid wearing high heels.  Avoid sleeping on a mattress that is too soft or too hard. You might have less pain if you sleep on a mattress that is firm enough to support your back.  Keep all follow-up visits as told by your doctor. This is important. Contact a doctor if:  You have pain that: ? Wakes you up when you are sleeping. ? Gets worse when you lie down. ? Is worse than the pain you have had in the past. ? Lasts longer than 4  weeks.  You lose weight without trying. Get help right away if:  You cannot control when you pee (urinate) or poop (have a bowel movement).  You have weakness in any of these areas and it gets worse: ? Lower back. ? The area between your hip bones. ? Butt. ? Legs.  You have redness or swelling of your back.  You have a burning feeling when you pee. Summary  Sciatica is pain, weakness, tingling, or loss of feeling (numbness) along the sciatic nerve.  This condition happens when the sciatic nerve is pinched or has pressure put on it.  Sciatica can cause pain, tingling, or loss of feeling (numbness) in the lower back, legs, hips, and butt.  Treatment often includes rest, exercise, medicines, and putting ice or heat on the affected area. This information is not intended to replace advice given to you by your health care provider. Make sure you discuss any questions you have with your health care provider. Document Released: 07/26/2008 Document Revised: 11/05/2018 Document Reviewed: 11/05/2018 Elsevier Patient Education  [place]: You are not alone, Seventy-five percent of women have some sort of abdominal or back pain at some point in their pregnancy. Your baby is growing at a fast pace, which means that your whole body is rapidly trying to adjust to the changes. As your uterus grows, your back may start feeling a bit under stress and this can result in back or abdominal pain that can go from mild, and therefore bearable, to severe pains that will not allow you to sit or lay down comfortably, When it comes to dealing with pregnancy-related pains and cramps, some pregnant women usually prefer natural remedies, which the market is filled with nowadays. For example, wearing a pregnancy support belt can help ease and lessen your discomfort and pain. WHAT ARE THE BENEFITS OF WEARING A PREGNANCY SUPPORT BELT? A pregnancy support belt provides support to the  lower portion of the belly taking some of the weight of the growing uterus and distributing to the other parts of your body. It is designed make you comfortable and gives  you extra support. Over the years, the pregnancy apparel market has been studying the needs and wants of pregnant women and they have come up with the most comfortable pregnancy support belts that woman could ever ask for. In fact, you will no longer have to wear a stretched-out or bulky pregnancy belt that is visible underneath your clothes and makes you feel even more uncomfortable. Nowadays, a pregnancy support belt is made of comfortable and stretchy materials that will not irritate your skin but will actually make you feel at ease and you will not even notice you are wearing it. They are easy to put on and adjust during the day and can be worn at night for additional support.  BENEFITS:  Relives Back pain  Relieves Abdominal Muscle and Leg Pain  Stabilizes the Pelvic Ring  Offers a Cushioned Abdominal Lift Pad  Relieves pressure on the Sciatic Nerve Within Minutes WHERE TO GET YOUR PREGNANCY BELT: [employer] [phone] @[address], [place] [address]

## 2019-07-11 NOTE — MAU Provider Note (Addendum)
History     CSN: [id number]  Arrival date and time: 07/11/19 1353   First Provider Initiated Contact with Patient 07/11/19 1439      Chief Complaint  Patient presents with  . Abdominal Pain   33 y.o. [id number] @23 .1 wks presenting with RLQ pain, sciatica, and low abd pressure. She reports onset 5 days ago. Pain in RLQ is located more in pelvic region and feels deep to the bone. Pain worsens with movement. Also feels constant low abd pressure in center of abd. Reports hx of sciatica, located in left buttock and worsened today. No recent injury or lifting. Rates pain 8/10. She took Tylenol earlier which helped a little. Denies urinary sx. No VB, LOF, or ctx. +FM. She moved here in July from [place]. Reports getting regular care and only complication was bleeding in first trimester. Pt is planning to get care at [facility] History    Gravida  7   Para  3   Term  2   Preterm  1   AB  3   Living  2     SAB  3   TAB      Ectopic      Multiple  1   Live Births  3           Past Medical History:  Diagnosis Date  . Anemia   . Asthma     Past Surgical History:  Procedure Laterality Date  . HERNIA REPAIR  123456   umbilical  . MANDIBLE SURGERY      Family History  Problem Relation Age of Onset  . Hypertension Other   . Diabetes Other   . Hyperlipidemia Other   . Coronary artery disease Other   . Arthritis Other   . Cancer Other     Social History   Tobacco Use  . Smoking status: Former Smoker  Substance Use Topics  . Alcohol use: No  . Drug use: No    Allergies:  Allergies  Allergen Reactions  . Other Swelling    tomato    Medications Prior to Admission  Medication Sig Dispense Refill Last Dose  . acetaminophen (TYLENOL) 500 MG tablet Take 500 mg by mouth every 6 (six) hours as needed for moderate pain.   07/11/2019 at Unknown time  . aspirin EC 81 MG tablet Take 81 mg by mouth daily.   07/10/2019 at Unknown time  . Prenatal MV-Min-FA-Omega-3  (PRENATAL GUMMIES/DHA & FA) 0.4-32.5 MG CHEW Chew 1 tablet by mouth daily.   07/10/2019 at Unknown time    Review of Systems  Constitutional: Negative for chills and fever.  Gastrointestinal: Positive for abdominal pain. Negative for constipation, diarrhea, nausea and vomiting.  Genitourinary: Positive for pelvic pain. Negative for dysuria, frequency, hematuria, vaginal bleeding and vaginal discharge.  Musculoskeletal: Positive for back pain.   Physical Exam   Blood pressure 110/67, pulse (!) 104, temperature 98.5 F (36.9 C), temperature source Oral, resp. rate 20, height 5\' 6"  (1.676 m), weight 93.1 kg, SpO2 99 %.  Physical Exam  Nursing note and vitals reviewed. Constitutional: She is oriented to person, place, and time. She appears well-developed and well-nourished. No distress.  HENT:  Head: Normocephalic and atraumatic.  Neck: Normal range of motion.  Cardiovascular: Normal rate.  Respiratory: Effort normal. No respiratory distress.  GI: Soft. She exhibits no distension and no mass. There is no abdominal tenderness. There is no rebound, no guarding and no CVA tenderness.  gravid  Genitourinary:  Genitourinary Comments: VE: closed/long   Musculoskeletal: Normal range of motion.     Cervical back: Normal.     Thoracic back: Normal.     Lumbar back: She exhibits tenderness (left). She exhibits no edema and no deformity.  Neurological: She is alert and oriented to person, place, and time.  Skin: Skin is warm and dry.  Psychiatric: She has a normal mood and affect.  EFM: 145 bpm, min variability, + accels, no decels Toco: none  Results for orders placed or performed during the hospital encounter of 07/11/19 (from the past 24 hour(s))  Urinalysis, Routine w reflex microscopic     Status: Abnormal   Collection Time: 07/11/19  2:24 PM  Result Value Ref Range   Color, Urine YELLOW YELLOW   APPearance CLEAR CLEAR   Specific Gravity, Urine 1.026 1.005 - 1.030   pH 6.0 5.0 - 8.0    Glucose, UA NEGATIVE NEGATIVE mg/dL   Hgb urine dipstick MODERATE (A) NEGATIVE   Bilirubin Urine NEGATIVE NEGATIVE   Ketones, ur NEGATIVE NEGATIVE mg/dL   Protein, ur NEGATIVE NEGATIVE mg/dL   Nitrite NEGATIVE NEGATIVE   Leukocytes,Ua NEGATIVE NEGATIVE   RBC / HPF 0-5 0 - 5 RBC/hpf   WBC, UA 0-5 0 - 5 WBC/hpf   Bacteria, UA RARE (A) NONE SEEN   Squamous Epithelial / LPF 0-5 0 - 5   Mucus PRESENT   Wet prep, genital     Status: Abnormal   Collection Time: 07/11/19  3:06 PM   Specimen: Vaginal  Result Value Ref Range   Yeast Wet Prep HPF POC NONE SEEN NONE SEEN   Trich, Wet Prep NONE SEEN NONE SEEN   Clue Cells Wet Prep HPF POC NONE SEEN NONE SEEN   WBC, Wet Prep HPF POC FEW (A) NONE SEEN   Sperm NONE SEEN    MAU Course  Procedures Orders Placed This Encounter  Procedures  . Wet prep, genital    Standing Status:   Standing    Number of Occurrences:   1    Order Specific Question:   Patient immune status    Answer:   Normal  . Culture, OB Urine    Standing Status:   Standing    Number of Occurrences:   1  . Urinalysis, Routine w reflex microscopic    Standing Status:   Standing    Number of Occurrences:   1  . CBC    Standing Status:   Standing    Number of Occurrences:   1   Meds ordered this encounter  Medications  . cyclobenzaprine (FLEXERIL) tablet 10 mg   MDM Labs ordered and reviewed. Hgb found in UA, pt reports hx of this, asymptomatic, will send UC. Pain much improved with Flexeril. Discussed other comfort measures including maternity belt, heat, and Tylenol. No signs of PTL. Stable for discharge home.   Assessment and Plan   1. [redacted] weeks gestation of pregnancy   2. Round ligament pain   3. Sciatica of left side   4. Musculoskeletal pain    Discharge home Follow up at [facility] to start [place]- message sent PTL precautions Rx Flexeril Rx maternity belt  Allergies as of 07/11/2019      Reactions   Other Swelling   tomato      Medication List     TAKE these medications   acetaminophen 500 MG tablet Commonly known as: TYLENOL Take 500 mg by mouth every 6 (six) hours as needed for  moderate pain.   aspirin EC 81 MG tablet Take 81 mg by mouth daily.   Comfort Fit Maternity Supp Lg Misc 1 application by Does not apply route daily.   cyclobenzaprine 10 MG tablet Commonly known as: FLEXERIL Take 1 tablet (10 mg total) by mouth 3 (three) times daily as needed for muscle spasms.   Prenatal Gummies/DHA & FA 0.4-32.5 MG Chew Chew 1 tablet by mouth daily.      Julianne Handler, CNM 07/11/2019, 3:08 PM

## 2019-07-12 LAB — CULTURE, OB URINE: Culture: <10000 — AB

## 2019-07-13 LAB — GC/CHLAMYDIA PROBE AMP (~~LOC~~) NOT AT ARMC
Chlamydia: NEGATIVE
Neisseria Gonorrhea: NEGATIVE

## 2019-07-30 ENCOUNTER — Telehealth: Payer: Self-pay | Admitting: Family Medicine

## 2019-07-30 NOTE — Telephone Encounter (Signed)
Spoke to patient about her appointment on 9/30 @ 10:30. Patient instructed that this visit will be a phone visit and she does not have to come to the office for this appointment. Patient instructed a nurse will be call her around her appointment time. Patient instructed to be available around her appointment. Patient verbalized understanding. Patient stated she needs her appointment on 10/7 changed because she can only do Mondays, Wednesdays, Fridays. Patient was rescheduled for 10/12 @ 9:55

## 2019-07-31 ENCOUNTER — Ambulatory Visit (INDEPENDENT_AMBULATORY_CARE_PROVIDER_SITE_OTHER): Payer: Medicaid Other | Admitting: *Deleted

## 2019-07-31 ENCOUNTER — Other Ambulatory Visit: Payer: Self-pay

## 2019-07-31 ENCOUNTER — Encounter: Payer: Self-pay | Admitting: *Deleted

## 2019-07-31 DIAGNOSIS — F4310 Post-traumatic stress disorder, unspecified: Secondary | ICD-10-CM

## 2019-07-31 DIAGNOSIS — J45909 Unspecified asthma, uncomplicated: Secondary | ICD-10-CM | POA: Insufficient documentation

## 2019-07-31 DIAGNOSIS — O09899 Supervision of other high risk pregnancies, unspecified trimester: Secondary | ICD-10-CM

## 2019-07-31 DIAGNOSIS — O0990 Supervision of high risk pregnancy, unspecified, unspecified trimester: Secondary | ICD-10-CM

## 2019-07-31 DIAGNOSIS — T7422XA Child sexual abuse, confirmed, initial encounter: Secondary | ICD-10-CM | POA: Insufficient documentation

## 2019-07-31 DIAGNOSIS — J4530 Mild persistent asthma, uncomplicated: Secondary | ICD-10-CM

## 2019-07-31 DIAGNOSIS — F419 Anxiety disorder, unspecified: Secondary | ICD-10-CM

## 2019-07-31 MED ORDER — BLOOD PRESSURE KIT DEVI: 1.0000 | 0 refills | Status: DC | PRN

## 2019-07-31 NOTE — Progress Notes (Signed)
I connected with  Meghan Reed on 07/31/19 at 10:30 AM EDT by telephone and verified that I am speaking with the correct person using two identifiers.   I discussed the limitations, risks, security and privacy concerns of performing an evaluation and management service by telephone and the availability of in person appointments. I also discussed with the patient that there may be a patient responsible charge related to this service. The patient expressed understanding and agreed to proceed.  Explained I am completing her New OB Intake today. We discussed Her EDD and that it is based on  sure LMP .We have received her transfer records from [place] - she states she also saw a High risk doctor and she will come by the office if possible soon to sign release for those records.  I reviewed her allergies, meds, OB History, Medical /Surgical history, and appropriate screenings. I explained we will send her [employer] app- app sent to her while on phone- she will download after the call because it is slow while on call.  Explained we will send a blood pressure cuff to her and the pharmacy that will send it will call her to verify her address. I asked her to bring the blood pressure cuff with her to her first ob appointment so we can show her how to use it. Explained  then we will have her take her blood pressure weekly and enter into the app. Explained she will have some visits in office and some virtually. I sent her MyChart text to sign up and she downloaded the  MyChart app. Reviewed appointment date/ time with her , our location and to wear mask, no visitors. Explained she will have exam, ob bloodwork if needed- she is agreeable to 2hr gtt/ 28 wk labs and I instructed her to be fasting. She voices understanding.  Nilani Hugill,RN 07/31/2019  10:34 AM

## 2019-08-01 ENCOUNTER — Encounter: Payer: Self-pay | Admitting: Family Medicine

## 2019-08-01 DIAGNOSIS — A6000 Herpesviral infection of urogenital system, unspecified: Secondary | ICD-10-CM | POA: Insufficient documentation

## 2019-08-01 DIAGNOSIS — R87619 Unspecified abnormal cytological findings in specimens from cervix uteri: Secondary | ICD-10-CM | POA: Insufficient documentation

## 2019-08-01 HISTORY — DX: Herpesviral infection of urogenital system, unspecified: A60.00

## 2019-08-01 HISTORY — DX: Unspecified abnormal cytological findings in specimens from cervix uteri: R87.619

## 2019-08-06 ENCOUNTER — Institutional Professional Consult (permissible substitution): Payer: Medicaid Other

## 2019-08-07 ENCOUNTER — Encounter: Payer: Medicaid Other | Admitting: Family Medicine

## 2019-08-08 ENCOUNTER — Ambulatory Visit: Payer: Medicaid Other | Admitting: Clinical

## 2019-08-08 ENCOUNTER — Other Ambulatory Visit: Payer: Self-pay

## 2019-08-08 DIAGNOSIS — Z5329 Procedure and treatment not carried out because of patient's decision for other reasons: Secondary | ICD-10-CM

## 2019-08-08 DIAGNOSIS — Z91199 Patient's noncompliance with other medical treatment and regimen due to unspecified reason: Secondary | ICD-10-CM

## 2019-08-08 NOTE — BH Specialist Note (Signed)
Pt did not arrive to video visit and did not answer the phone; Left HIPPA-compliant message to call back Roselyn Reef from [facility] [employer] [facility] at [phone], and left MyChart message for patient.   [facility] via Telemedicine Video Visit  08/08/2019 Meghan Reed [id number]   Garlan Fair

## 2019-08-12 ENCOUNTER — Other Ambulatory Visit (HOSPITAL_COMMUNITY)
Admission: RE | Admit: 2019-08-12 | Discharge: 2019-08-12 | Disposition: A | Discharge status: Home / Self Care | Payer: Medicaid Other | Source: Ambulatory Visit | Attending: Family Medicine | Admitting: Family Medicine

## 2019-08-12 ENCOUNTER — Encounter: Payer: Self-pay | Admitting: Family Medicine

## 2019-08-12 ENCOUNTER — Ambulatory Visit (INDEPENDENT_AMBULATORY_CARE_PROVIDER_SITE_OTHER): Payer: Medicaid Other | Admitting: Family Medicine

## 2019-08-12 ENCOUNTER — Other Ambulatory Visit: Payer: Self-pay

## 2019-08-12 VITALS — BP 107/71 | HR 125 | Wt 209.1 lb

## 2019-08-12 DIAGNOSIS — Z23 Encounter for immunization: Secondary | ICD-10-CM | POA: Diagnosis not present

## 2019-08-12 DIAGNOSIS — R87618 Other abnormal cytological findings on specimens from cervix uteri: Secondary | ICD-10-CM

## 2019-08-12 DIAGNOSIS — O0992 Supervision of high risk pregnancy, unspecified, second trimester: Secondary | ICD-10-CM

## 2019-08-12 DIAGNOSIS — O09892 Supervision of other high risk pregnancies, second trimester: Secondary | ICD-10-CM

## 2019-08-12 DIAGNOSIS — O0990 Supervision of high risk pregnancy, unspecified, unspecified trimester: Secondary | ICD-10-CM | POA: Diagnosis present

## 2019-08-12 DIAGNOSIS — A6000 Herpesviral infection of urogenital system, unspecified: Secondary | ICD-10-CM

## 2019-08-12 DIAGNOSIS — R3129 Other microscopic hematuria: Secondary | ICD-10-CM | POA: Insufficient documentation

## 2019-08-12 DIAGNOSIS — Z3A27 27 weeks gestation of pregnancy: Secondary | ICD-10-CM

## 2019-08-12 DIAGNOSIS — O09899 Supervision of other high risk pregnancies, unspecified trimester: Secondary | ICD-10-CM

## 2019-08-12 LAB — POCT URINALYSIS DIP (DEVICE)
Bilirubin Urine: NEGATIVE
Glucose, UA: NEGATIVE mg/dL
Ketones, ur: NEGATIVE mg/dL
Leukocytes,Ua: NEGATIVE
Nitrite: NEGATIVE
Protein, ur: 30 mg/dL — AB
Specific Gravity, Urine: 1.02 (ref 1.005–1.030)
Urobilinogen, UA: 0.2 mg/dL (ref 0.0–1.0)
pH: 7 (ref 5.0–8.0)

## 2019-08-12 NOTE — Patient Instructions (Signed)

## 2019-08-12 NOTE — Progress Notes (Signed)
7/22 last U/S
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
## 2019-08-13 LAB — URINALYSIS, ROUTINE W REFLEX MICROSCOPIC
Bilirubin, UA: NEGATIVE
Glucose, UA: NEGATIVE
Ketones, UA: NEGATIVE
Leukocytes,UA: NEGATIVE
Nitrite, UA: NEGATIVE
Specific Gravity, UA: 1.017 (ref 1.005–1.030)
Urobilinogen, Ur: 0.2 mg/dL (ref 0.2–1.0)
pH, UA: 7 (ref 5.0–7.5)

## 2019-08-13 LAB — MICROSCOPIC EXAMINATION: Casts: NONE SEEN /lpf

## 2019-08-13 LAB — CBC
Hematocrit: 33.3 % — ABNORMAL LOW (ref 34.0–46.6)
Hemoglobin: 11.2 g/dL (ref 11.1–15.9)
MCH: 29.5 pg (ref 26.6–33.0)
MCHC: 33.6 g/dL (ref 31.5–35.7)
MCV: 88 fL (ref 79–97)
Platelets: 151 10*3/uL (ref 150–450)
RBC: 3.8 x10E6/uL (ref 3.77–5.28)
RDW: 12.6 % (ref 11.7–15.4)
WBC: 9.4 10*3/uL (ref 3.4–10.8)

## 2019-08-13 LAB — RPR: RPR Ser Ql: NONREACTIVE

## 2019-08-13 LAB — GLUCOSE TOLERANCE, 2 HOURS W/ 1HR
Glucose, 1 hour: 132 mg/dL (ref 65–179)
Glucose, 2 hour: 106 mg/dL (ref 65–152)
Glucose, Fasting: 78 mg/dL (ref 65–91)

## 2019-08-13 LAB — HEMOGLOBIN A1C
Est. average glucose Bld gHb Est-mCnc: 103 mg/dL
Hgb A1c MFr Bld: 5.2 % (ref 4.8–5.6)

## 2019-08-13 LAB — HEPATITIS B SURFACE ANTIGEN: Hepatitis B Surface Ag: NEGATIVE

## 2019-08-13 LAB — HIV ANTIBODY (ROUTINE TESTING W REFLEX): HIV Screen 4th Generation wRfx: NONREACTIVE

## 2019-08-13 LAB — HEPATITIS C ANTIBODY: Hep C Virus Ab: <0.1 s/co ratio (ref 0.0–0.9)

## 2019-08-19 LAB — CERVICOVAGINAL ANCILLARY ONLY
Bacterial Vaginitis (gardnerella): NEGATIVE
Candida Glabrata: NEGATIVE
Candida Vaginitis: NEGATIVE
Chlamydia: NEGATIVE
Comment: NEGATIVE
Comment: NEGATIVE
Comment: NEGATIVE
Comment: NEGATIVE
Comment: NEGATIVE
Comment: NORMAL
Neisseria Gonorrhea: NEGATIVE
Trichomonas: NEGATIVE

## 2019-08-25 ENCOUNTER — Other Ambulatory Visit: Payer: Self-pay

## 2019-08-25 ENCOUNTER — Inpatient Hospital Stay (HOSPITAL_COMMUNITY): Payer: Medicaid Other

## 2019-08-25 ENCOUNTER — Observation Stay (HOSPITAL_COMMUNITY)
Admission: AD | Admit: 2019-08-25 | Discharge: 2019-08-26 | Disposition: A | Discharge status: Home / Self Care | Payer: Medicaid Other | Attending: Obstetrics and Gynecology | Admitting: Obstetrics and Gynecology

## 2019-08-25 ENCOUNTER — Encounter (HOSPITAL_COMMUNITY): Payer: Self-pay

## 2019-08-25 DIAGNOSIS — R0602 Shortness of breath: Secondary | ICD-10-CM | POA: Diagnosis not present

## 2019-08-25 DIAGNOSIS — O26893 Other specified pregnancy related conditions, third trimester: Secondary | ICD-10-CM | POA: Diagnosis present

## 2019-08-25 DIAGNOSIS — O0990 Supervision of high risk pregnancy, unspecified, unspecified trimester: Secondary | ICD-10-CM

## 2019-08-25 DIAGNOSIS — R1011 Right upper quadrant pain: Secondary | ICD-10-CM | POA: Diagnosis present

## 2019-08-25 DIAGNOSIS — J45909 Unspecified asthma, uncomplicated: Secondary | ICD-10-CM | POA: Insufficient documentation

## 2019-08-25 DIAGNOSIS — I2699 Other pulmonary embolism without acute cor pulmonale: Secondary | ICD-10-CM | POA: Diagnosis not present

## 2019-08-25 DIAGNOSIS — Z87891 Personal history of nicotine dependence: Secondary | ICD-10-CM | POA: Diagnosis not present

## 2019-08-25 DIAGNOSIS — Z3A29 29 weeks gestation of pregnancy: Secondary | ICD-10-CM

## 2019-08-25 DIAGNOSIS — Z20828 Contact with and (suspected) exposure to other viral communicable diseases: Secondary | ICD-10-CM | POA: Insufficient documentation

## 2019-08-25 LAB — AMYLASE: Amylase: 56 U/L (ref 28–100)

## 2019-08-25 LAB — CBC WITH DIFFERENTIAL/PLATELET
Abs Immature Granulocytes: 0.16 10*3/uL — ABNORMAL HIGH (ref 0.00–0.07)
Basophils Absolute: 0 10*3/uL (ref 0.0–0.1)
Basophils Relative: 0 %
Eosinophils Absolute: 0.2 10*3/uL (ref 0.0–0.5)
Eosinophils Relative: 2 %
HCT: 31.7 % — ABNORMAL LOW (ref 36.0–46.0)
Hemoglobin: 10.9 g/dL — ABNORMAL LOW (ref 12.0–15.0)
Immature Granulocytes: 2 %
Lymphocytes Relative: 19 %
Lymphs Abs: 1.8 10*3/uL (ref 0.7–4.0)
MCH: 29.9 pg (ref 26.0–34.0)
MCHC: 34.4 g/dL (ref 30.0–36.0)
MCV: 87.1 fL (ref 80.0–100.0)
Monocytes Absolute: 0.9 10*3/uL (ref 0.1–1.0)
Monocytes Relative: 9 %
Neutro Abs: 6.4 10*3/uL (ref 1.7–7.7)
Neutrophils Relative %: 68 %
Platelets: 147 10*3/uL — ABNORMAL LOW (ref 150–400)
RBC: 3.64 MIL/uL — ABNORMAL LOW (ref 3.87–5.11)
RDW: 13 % (ref 11.5–15.5)
WBC: 9.4 10*3/uL (ref 4.0–10.5)
nRBC: 0.2 % (ref 0.0–0.2)

## 2019-08-25 LAB — URINALYSIS, ROUTINE W REFLEX MICROSCOPIC
Bacteria, UA: NONE SEEN
Bilirubin Urine: NEGATIVE
Glucose, UA: NEGATIVE mg/dL
Ketones, ur: NEGATIVE mg/dL
Leukocytes,Ua: NEGATIVE
Nitrite: NEGATIVE
Protein, ur: NEGATIVE mg/dL
Specific Gravity, Urine: 1.004 — ABNORMAL LOW (ref 1.005–1.030)
pH: 7 (ref 5.0–8.0)

## 2019-08-25 LAB — COMPREHENSIVE METABOLIC PANEL
ALT: 10 U/L (ref 0–44)
AST: 12 U/L — ABNORMAL LOW (ref 15–41)
Albumin: 2.7 g/dL — ABNORMAL LOW (ref 3.5–5.0)
Alkaline Phosphatase: 47 U/L (ref 38–126)
Anion gap: 9 (ref 5–15)
BUN: <5 mg/dL — ABNORMAL LOW (ref 6–20)
CO2: 20 mmol/L — ABNORMAL LOW (ref 22–32)
Calcium: 8.9 mg/dL (ref 8.9–10.3)
Chloride: 107 mmol/L (ref 98–111)
Creatinine, Ser: 0.52 mg/dL (ref 0.44–1.00)
GFR calc Af Amer: >60 mL/min (ref >60)
GFR calc non Af Amer: >60 mL/min (ref >60)
Glucose, Bld: 71 mg/dL (ref 70–99)
Potassium: 3.7 mmol/L (ref 3.5–5.1)
Sodium: 136 mmol/L (ref 135–145)
Total Bilirubin: <0.1 mg/dL — ABNORMAL LOW (ref 0.3–1.2)
Total Protein: 6 g/dL — ABNORMAL LOW (ref 6.5–8.1)

## 2019-08-25 LAB — TYPE AND SCREEN
ABO/RH(D): B POS
Antibody Screen: NEGATIVE

## 2019-08-25 LAB — ABO/RH: ABO/RH(D): B POS

## 2019-08-25 LAB — TROPONIN I (HIGH SENSITIVITY): Troponin I (High Sensitivity): 4 ng/L (ref <18)

## 2019-08-25 LAB — SARS CORONAVIRUS 2 BY RT PCR (HOSPITAL ORDER, PERFORMED IN ~~LOC~~ HOSPITAL LAB): SARS Coronavirus 2: NEGATIVE

## 2019-08-25 LAB — LIPASE, BLOOD: Lipase: 30 U/L (ref 11–51)

## 2019-08-25 MED ORDER — LACTATED RINGERS IV BOLUS
1000.0000 mL | Freq: Once | INTRAVENOUS | Status: AC
  Administered 2019-08-25: 1000 mL via INTRAVENOUS

## 2019-08-25 MED ORDER — HYDROMORPHONE HCL 1 MG/ML IJ SOLN
1.0000 mg | Freq: Once | INTRAMUSCULAR | Status: AC
  Administered 2019-08-25: 1 mg via INTRAVENOUS
  Filled 2019-08-25: qty 1

## 2019-08-25 MED ORDER — ACETAMINOPHEN 325 MG PO TABS
650.0000 mg | ORAL_TABLET | ORAL | Status: DC | PRN
  Administered 2019-08-26: 650 mg via ORAL
  Filled 2019-08-25: qty 2

## 2019-08-25 MED ORDER — PRENATAL MULTIVITAMIN CH
1.0000 | ORAL_TABLET | Freq: Every day | ORAL | Status: DC
  Administered 2019-08-26: 1 via ORAL
  Filled 2019-08-25: qty 1

## 2019-08-25 MED ORDER — ONDANSETRON 4 MG PO TBDP
8.0000 mg | ORAL_TABLET | Freq: Once | ORAL | Status: AC
  Administered 2019-08-25: 8 mg via ORAL
  Filled 2019-08-25: qty 2

## 2019-08-25 MED ORDER — LACTATED RINGERS IV SOLN
INTRAVENOUS | Status: DC
  Administered 2019-08-25 – 2019-08-26 (×3): via INTRAVENOUS

## 2019-08-25 MED ORDER — SIMETHICONE 80 MG PO CHEW
80.0000 mg | CHEWABLE_TABLET | Freq: Once | ORAL | Status: AC
  Administered 2019-08-25: 80 mg via ORAL
  Filled 2019-08-25: qty 1

## 2019-08-25 MED ORDER — HYDROMORPHONE HCL 1 MG/ML IJ SOLN
0.5000 mg | INTRAMUSCULAR | Status: DC | PRN
  Administered 2019-08-25 – 2019-08-26 (×3): 0.5 mg via INTRAVENOUS
  Filled 2019-08-25 (×3): qty 0.5

## 2019-08-25 MED ORDER — PANTOPRAZOLE SODIUM 40 MG PO TBEC
40.0000 mg | DELAYED_RELEASE_TABLET | Freq: Every day | ORAL | Status: DC
  Administered 2019-08-25 – 2019-08-26 (×2): 40 mg via ORAL
  Filled 2019-08-25 (×2): qty 1

## 2019-08-25 MED ORDER — CALCIUM CARBONATE ANTACID 500 MG PO CHEW: 2.0000 | CHEWABLE_TABLET | ORAL | Status: DC | PRN

## 2019-08-25 MED ORDER — ZOLPIDEM TARTRATE 5 MG PO TABS
5.0000 mg | ORAL_TABLET | Freq: Every evening | ORAL | Status: DC | PRN
  Filled 2019-08-25: qty 1

## 2019-08-25 MED ORDER — PROMETHAZINE HCL 25 MG/ML IJ SOLN
25.0000 mg | Freq: Once | INTRAMUSCULAR | Status: AC
  Administered 2019-08-25: 25 mg via INTRAVENOUS
  Filled 2019-08-25: qty 1

## 2019-08-25 MED ORDER — DOCUSATE SODIUM 100 MG PO CAPS
100.0000 mg | ORAL_CAPSULE | Freq: Every day | ORAL | Status: DC
  Administered 2019-08-26: 10:00:00 100 mg via ORAL
  Filled 2019-08-25: qty 1

## 2019-08-25 NOTE — MAU Note (Signed)
Meghan Reed is a 33 y.o. at [redacted]w[redacted]d here in MAU reporting: stabbing pain in her RUQ under right breast that radiates to right shoulder. States it is getting worse. Started yesterday. Feels a little bit short of breath. Having some BH contractions but no bleeding or LOF. +FM  Onset of complaint: yesterday but getting worse  Pain score: 9/10  Vitals:   08/25/19 1132  BP: 99/67  Pulse: (!) 105  Resp: (!) 32  Temp: 98.4 F (36.9 C)     FHT: +FM  Lab orders placed from triage: UA
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
## 2019-08-26 ENCOUNTER — Encounter: Payer: Self-pay | Admitting: Family Medicine

## 2019-08-26 ENCOUNTER — Inpatient Hospital Stay (HOSPITAL_COMMUNITY): Payer: Medicaid Other

## 2019-08-26 ENCOUNTER — Encounter: Payer: Medicaid Other | Admitting: Family Medicine

## 2019-08-26 ENCOUNTER — Encounter (HOSPITAL_COMMUNITY): Payer: Self-pay

## 2019-08-26 ENCOUNTER — Observation Stay (HOSPITAL_BASED_OUTPATIENT_CLINIC_OR_DEPARTMENT_OTHER): Payer: Medicaid Other

## 2019-08-26 ENCOUNTER — Encounter: Payer: Self-pay | Admitting: Obstetrics and Gynecology

## 2019-08-26 DIAGNOSIS — M7989 Other specified soft tissue disorders: Secondary | ICD-10-CM

## 2019-08-26 DIAGNOSIS — Z3A29 29 weeks gestation of pregnancy: Secondary | ICD-10-CM | POA: Diagnosis not present

## 2019-08-26 DIAGNOSIS — M79609 Pain in unspecified limb: Secondary | ICD-10-CM

## 2019-08-26 DIAGNOSIS — R1011 Right upper quadrant pain: Secondary | ICD-10-CM | POA: Diagnosis not present

## 2019-08-26 DIAGNOSIS — I2699 Other pulmonary embolism without acute cor pulmonale: Secondary | ICD-10-CM

## 2019-08-26 DIAGNOSIS — I2693 Single subsegmental pulmonary embolism without acute cor pulmonale: Secondary | ICD-10-CM

## 2019-08-26 DIAGNOSIS — O26893 Other specified pregnancy related conditions, third trimester: Secondary | ICD-10-CM | POA: Diagnosis not present

## 2019-08-26 DIAGNOSIS — R0602 Shortness of breath: Secondary | ICD-10-CM | POA: Diagnosis not present

## 2019-08-26 LAB — CBC
HCT: 27.8 % — ABNORMAL LOW (ref 36.0–46.0)
Hemoglobin: 9.3 g/dL — ABNORMAL LOW (ref 12.0–15.0)
MCH: 29.7 pg (ref 26.0–34.0)
MCHC: 33.5 g/dL (ref 30.0–36.0)
MCV: 88.8 fL (ref 80.0–100.0)
Platelets: 142 10*3/uL — ABNORMAL LOW (ref 150–400)
RBC: 3.13 MIL/uL — ABNORMAL LOW (ref 3.87–5.11)
RDW: 13.3 % (ref 11.5–15.5)
WBC: 7.7 10*3/uL (ref 4.0–10.5)
nRBC: 0.3 % — ABNORMAL HIGH (ref 0.0–0.2)

## 2019-08-26 LAB — COMPREHENSIVE METABOLIC PANEL
ALT: 9 U/L (ref 0–44)
AST: 9 U/L — ABNORMAL LOW (ref 15–41)
Albumin: 2 g/dL — ABNORMAL LOW (ref 3.5–5.0)
Alkaline Phosphatase: 37 U/L — ABNORMAL LOW (ref 38–126)
Anion gap: 8 (ref 5–15)
BUN: <5 mg/dL — ABNORMAL LOW (ref 6–20)
CO2: 24 mmol/L (ref 22–32)
Calcium: 8.5 mg/dL — ABNORMAL LOW (ref 8.9–10.3)
Chloride: 105 mmol/L (ref 98–111)
Creatinine, Ser: 0.61 mg/dL (ref 0.44–1.00)
GFR calc Af Amer: >60 mL/min (ref >60)
GFR calc non Af Amer: >60 mL/min (ref >60)
Glucose, Bld: 89 mg/dL (ref 70–99)
Potassium: 3.5 mmol/L (ref 3.5–5.1)
Sodium: 137 mmol/L (ref 135–145)
Total Bilirubin: 0.3 mg/dL (ref 0.3–1.2)
Total Protein: 4.7 g/dL — ABNORMAL LOW (ref 6.5–8.1)

## 2019-08-26 MED ORDER — IOHEXOL 350 MG/ML SOLN
75.0000 mL | Freq: Once | INTRAVENOUS | Status: AC | PRN
  Administered 2019-08-26: 75 mL via INTRAVENOUS

## 2019-08-26 MED ORDER — ENOXAPARIN SODIUM 100 MG/ML ~~LOC~~ SOLN: 100.0000 mg | Freq: Two times a day (BID) | SUBCUTANEOUS | 3 refills | Status: DC

## 2019-08-26 MED ORDER — ENOXAPARIN SODIUM 100 MG/ML ~~LOC~~ SOLN
100.0000 mg | Freq: Two times a day (BID) | SUBCUTANEOUS | Status: DC
  Administered 2019-08-26 (×2): 100 mg via SUBCUTANEOUS
  Filled 2019-08-26 (×3): qty 1

## 2019-08-26 MED ORDER — PANTOPRAZOLE SODIUM 40 MG PO TBEC: 40.0000 mg | DELAYED_RELEASE_TABLET | Freq: Every day | ORAL | 3 refills | Status: DC

## 2019-08-26 MED ORDER — ENOXAPARIN (LOVENOX) PATIENT EDUCATION KIT
PACK | Freq: Once | Status: DC
  Filled 2019-08-26: qty 1

## 2019-08-26 NOTE — Discharge Summary (Signed)
Obstetric Discharge Summary Reason for Admission: RUQ pain Prenatal Procedures: none  Hemoglobin  Date Value Ref Range Status  08/26/2019 9.3 (L) 12.0 - 15.0 g/dL Final  08/12/2019 11.2 11.1 - 15.9 g/dL Final  03/12/2019 12.5  Final   HCT  Date Value Ref Range Status  08/26/2019 27.8 (L) 36.0 - 46.0 % Final  03/12/2019 35 29 - 41 Final   Hematocrit  Date Value Ref Range Status  08/12/2019 33.3 (L) 34.0 - 46.6 % Final    Physical Exam:  GENERAL: Well-developed, well-nourished female in no acute distress.  LUNGS: Clear to auscultation bilaterally.  HEART: Regular rate and rhythm. ABDOMEN: Soft, nontender, gravid PELVIC: Not indicated. EXTREMITIES: No cyanosis, clubbing, or edema, 2+ distal pulses.  FHT: baseline 140, mod variability, + accels, no decels [place]: no contractions  LE doppler: prelim negative for DVT bilaterally  Discharge Diagnoses: Pulmonary embolism  Discharge Information: Date: 08/26/2019 Activity: unrestricted Diet: routine Medications: therapeutic lovenox Condition: stable Instructions: Lovenox teaching provided Discharge to: home [place] for [facility] Follow up in 1 week(s).   Specialty: Obstetrics and Gynecology Why: An appointment will be made for you to follow up for your missed appointment Contact information: [address] [place], [place] [id number] [place] [phone] [place] 08/26/2019, 1:58 PM

## 2019-08-26 NOTE — Progress Notes (Signed)
[place] NOTE  Meghan [place] is a 33 y.o. [phone] at [redacted]w[redacted]d who is admitted for RUQ pain.  Estimated Date of Delivery: 11/06/19 Fetal presentation is unsure.  Length of Stay:  1 Days. Admitted 08/25/2019  Subjective: Patient reports pain is slightly improved, was able to take tylenol with some relief overnight. Reports normal fetal movement.  She denies uterine contractions, denies bleeding and leaking of fluid per vagina.  Vitals:  Blood pressure (!) 90/51, pulse 89, temperature 98 F (36.7 C), temperature source Oral, resp. rate 17, height 5\' 6"  (1.676 m), weight 97.2 kg, last menstrual period 01/30/2019, SpO2 100 %. Physical Examination: CONSTITUTIONAL: Well-developed, well-nourished female in no acute distress.  HENT:  Normocephalic, atraumatic, External right and left ear normal. Oropharynx is clear and moist EYES: Conjunctivae and EOM are normal. Pupils are equal, round, and reactive to light. No scleral icterus.  NECK: Normal range of motion, supple, no masses. SKIN: Skin is warm and dry. No rash noted. Not diaphoretic. No erythema. No pallor. [place]: Alert and oriented to person, place, and time. Normal reflexes, muscle tone coordination. No cranial nerve deficit noted. PSYCHIATRIC: Normal mood and affect. Normal behavior. Normal judgment and thought content. CARDIOVASCULAR: Normal heart rate noted RESPIRATORY: Effort normal, no problems with respiration noted MUSCULOSKELETAL: Normal range of motion. No edema and no tenderness. ABDOMEN: Soft, nontender, nondistended, gravid. CERVIX: deferred  Fetal monitoring: FHR: 140 bpm, Variability: moderate, Accelerations: Present, Decelerations: Absent  Uterine activity: no contractions per hour  Results for orders placed or performed during the hospital encounter of 08/25/19 (from the past 48 hour(s))  Urinalysis, Routine w reflex microscopic     Status: Abnormal   Collection Time: 08/25/19 11:50 AM   Result Value Ref Range   Color, Urine STRAW (A) YELLOW   APPearance CLEAR CLEAR   Specific Gravity, Urine 1.004 (L) 1.005 - 1.030   pH 7.0 5.0 - 8.0   Glucose, UA NEGATIVE NEGATIVE mg/dL   Hgb urine dipstick SMALL (A) NEGATIVE   Bilirubin Urine NEGATIVE NEGATIVE   Ketones, ur NEGATIVE NEGATIVE mg/dL   Protein, ur NEGATIVE NEGATIVE mg/dL   Nitrite NEGATIVE NEGATIVE   Leukocytes,Ua NEGATIVE NEGATIVE   RBC / HPF 0-5 0 - 5 RBC/hpf   WBC, UA 0-5 0 - 5 WBC/hpf   Bacteria, UA NONE SEEN NONE SEEN   Squamous Epithelial / LPF 0-5 0 - 5    Comment: Performed at [facility], [place] [address], [place], [place] [address]  CBC with Differential/Platelet     Status: Abnormal   Collection Time: 08/25/19 12:18 PM  Result Value Ref Range   WBC 9.4 4.0 - 10.5 K/uL   RBC 3.64 (L) 3.87 - 5.11 MIL/uL   Hemoglobin 10.9 (L) 12.0 - 15.0 g/dL   HCT 31.7 (L) 36.0 - 46.0 %   MCV 87.1 80.0 - 100.0 fL   MCH 29.9 26.0 - 34.0 pg   MCHC 34.4 30.0 - 36.0 g/dL   RDW 13.0 11.5 - 15.5 %   Platelets 147 (L) 150 - 400 K/uL   nRBC 0.2 0.0 - 0.2 %   Neutrophils Relative % 68 %   Neutro Abs 6.4 1.7 - 7.7 K/uL   Lymphocytes Relative 19 %   Lymphs Abs 1.8 0.7 - 4.0 K/uL   Monocytes Relative 9 %   Monocytes Absolute 0.9 0.1 - 1.0 K/uL   Eosinophils Relative 2 %   Eosinophils Absolute 0.2 0.0 - 0.5 K/uL   Basophils Relative 0 %  Basophils Absolute 0.0 0.0 - 0.1 K/uL   Immature Granulocytes 2 %   Abs Immature Granulocytes 0.16 (H) 0.00 - 0.07 K/uL    Comment: Performed at [facility], [place] [address], [place], [place] [address]  Comprehensive metabolic panel     Status: Abnormal   Collection Time: 08/25/19 12:18 PM  Result Value Ref Range   Sodium 136 135 - 145 mmol/L   Potassium 3.7 3.5 - 5.1 mmol/L   Chloride 107 98 - 111 mmol/L   CO2 20 (L) 22 - 32 mmol/L   Glucose, Bld 71 70 - 99 mg/dL   BUN <5 (L) 6 - 20 mg/dL   Creatinine, Ser 0.52 0.44 - 1.00 mg/dL   Calcium 8.9 8.9 - 10.3 mg/dL   Total  Protein 6.0 (L) 6.5 - 8.1 g/dL   Albumin 2.7 (L) 3.5 - 5.0 g/dL   AST 12 (L) 15 - 41 U/L   ALT 10 0 - 44 U/L   Alkaline Phosphatase 47 38 - 126 U/L   Total Bilirubin <0.1 (L) 0.3 - 1.2 mg/dL   GFR calc non Af Amer >60 >60 mL/min   GFR calc Af Amer >60 >60 mL/min   Anion gap 9 5 - 15    Comment: Performed at [facility], [place] [address], [place], [place] [address]  Lipase, blood     Status: None   Collection Time: 08/25/19 12:18 PM  Result Value Ref Range   Lipase 30 11 - 51 U/L    Comment: Performed at [facility], [place] [address] [place], [place] [address]  Troponin I (High Sensitivity)     Status: None   Collection Time: 08/25/19 12:18 PM  Result Value Ref Range   Troponin I (High Sensitivity) 4 <18 ng/L    Comment: (NOTE) Elevated high sensitivity troponin I (hsTnI) values and significant  changes across serial measurements may suggest ACS but many other  chronic and acute conditions are known to elevate hsTnI results.  Refer to the Links section for chest pain algorithms and additional  guidance. Performed at [facility], [place] [address], [place], [place] [address]   Amylase     Status: None   Collection Time: 08/25/19 12:18 PM  Result Value Ref Range   Amylase 56 28 - 100 U/L    Comment: Performed at [facility] [place] [address] [place], [place] [address]  SARS Coronavirus 2 by RT PCR (hospital order, performed in [facility] hospital lab) Nasopharyngeal Nasopharyngeal Swab     Status: None   Collection Time: 08/25/19 12:46 PM   Specimen: Nasopharyngeal Swab  Result Value Ref Range   SARS Coronavirus 2 NEGATIVE NEGATIVE    Comment: (NOTE) If result is NEGATIVE SARS-CoV-2 target nucleic acids are NOT DETECTED. The SARS-CoV-2 RNA is generally detectable in upper and lower  respiratory specimens during the acute phase of infection. The lowest  concentration of SARS-CoV-2 viral copies this assay can detect is 250  copies / mL. A negative result  does not preclude SARS-CoV-2 infection  and should not be used as the sole basis for treatment or other  patient management decisions.  A negative result may occur with  improper specimen collection / handling, submission of specimen other  than nasopharyngeal swab, presence of viral mutation(s) within the  areas targeted by this assay, and inadequate number of viral copies  (<250 copies / mL). A negative result must be combined with clinical  observations,  patient history, and epidemiological information. If result is POSITIVE SARS-CoV-2 target nucleic acids are DETECTED. The SARS-CoV-2 RNA is generally detectable in upper and lower  respiratory specimens dur ing the acute phase of infection.  Positive  results are indicative of active infection with SARS-CoV-2.  Clinical  correlation with patient history and other diagnostic information is  necessary to determine patient infection status.  Positive results do  not rule out bacterial infection or co-infection with other viruses. If result is PRESUMPTIVE POSTIVE SARS-CoV-2 nucleic acids MAY BE PRESENT.   A presumptive positive result was obtained on the submitted specimen  and confirmed on repeat testing.  While 2019 novel coronavirus  (SARS-CoV-2) nucleic acids may be present in the submitted sample  additional confirmatory testing may be necessary for epidemiological  and / or clinical management purposes  to differentiate between  SARS-CoV-2 and other Sarbecovirus currently known to infect humans.  If clinically indicated additional testing with an alternate test  methodology [phone]) is advised. The SARS-CoV-2 RNA is generally  detectable in upper and lower respiratory sp ecimens during the acute  phase of infection. The expected result is Negative. Fact Sheet for Patients:  StrictlyIdeas.no Fact Sheet for Healthcare Providers: BankingDealers.co.za This test is not yet approved or  cleared by the [place] FDA and has been authorized for detection and/or diagnosis of SARS-CoV-2 by FDA under an Emergency Use Authorization (EUA).  This EUA will remain in effect (meaning this test can be used) for the duration of the COVID-19 declaration under Section 564(b)(1) of the Act, 21 U.S.C. section 360bbb-3(b)(1), unless the authorization is terminated or revoked sooner. Performed at [facility], [place] [address] [place], [place] [address]   Type and screen [place]     Status: None   Collection Time: 08/25/19  8:16 PM  Result Value Ref Range   ABO/RH(D) B POS    Antibody Screen NEG    Sample Expiration      08/28/2019,2359 Performed at [facility], [place] [address], [place], [place] [address]   ABO/Rh     Status: None   Collection Time: 08/25/19  8:16 PM  Result Value Ref Range   ABO/RH(D)      B POS Performed at [facility] [address], [place], [place] [address]   CBC     Status: Abnormal   Collection Time: 08/26/19  5:02 AM  Result Value Ref Range   WBC 7.7 4.0 - 10.5 K/uL   RBC 3.13 (L) 3.87 - 5.11 MIL/uL   Hemoglobin 9.3 (L) 12.0 - 15.0 g/dL   HCT 27.8 (L) 36.0 - 46.0 %   MCV 88.8 80.0 - 100.0 fL   MCH 29.7 26.0 - 34.0 pg   MCHC 33.5 30.0 - 36.0 g/dL   RDW 13.3 11.5 - 15.5 %   Platelets 142 (L) 150 - 400 K/uL   nRBC 0.3 (H) 0.0 - 0.2 %    Comment: Performed at [facility] [address] [place], [place] [address]  Comprehensive metabolic panel     Status: Abnormal   Collection Time: 08/26/19  5:02 AM  Result Value Ref Range   Sodium 137 135 - 145 mmol/L   Potassium 3.5 3.5 - 5.1 mmol/L   Chloride 105 98 - 111 mmol/L   CO2 24 22 - 32 mmol/L   Glucose, Bld 89 70 - 99 mg/dL   BUN <5 (L) 6 - 20 mg/dL   Creatinine, Ser  0.61 0.44 - 1.00 mg/dL   Calcium 8.5 (L) 8.9 - 10.3 mg/dL   Total Protein 4.7 (L) 6.5 - 8.1 g/dL   Albumin 2.0 (L) 3.5 - 5.0 g/dL   AST 9 (L) 15 - 41 U/L   ALT 9 0 - 44 U/L    Alkaline Phosphatase 37 (L) 38 - 126 U/L   Total Bilirubin 0.3 0.3 - 1.2 mg/dL   GFR calc non Af Amer >60 >60 mL/min   GFR calc Af Amer >60 >60 mL/min   Anion gap 8 5 - 15    Comment: Performed at [facility] [address], [place],  [address]    I have reviewed the patient's current medications.  ASSESSMENT: Active Problems:   Supervision of high risk pregnancy, antepartum   RUQ pain   Pulmonary embolism (HCC)   PLAN: Reviewed diagnosis with patient, expected course and plan for rest of pregnancy, she verbalizes understanding and is in agreement with plan - cont lovenox 100 mg BID - venous duplex today - lovenox teaching - likely home later today    Continue routine antenatal care.   Feliz Beam, M.D. [facility]ending [facility] [employer] (Faculty Practice)  08/26/2019 7:58 AM

## 2019-08-26 NOTE — Progress Notes (Signed)
Patient did not keep appointment today. She will be called to reschedule.  

## 2019-08-26 NOTE — Progress Notes (Signed)
Faculty Note  Back down to speak with patient regarding recommendation for CTA by hospitalist. On discussion, patient reports 2 week history of sore left calf, cramping to the point that she is limping and has increased pain with flexed calf. Did not think to mention this earlier, has mentioned it in the office.   BP 95/62 (BP Location: Right Arm)   Pulse 75   Temp 97.8 F (36.6 C) (Oral)   Resp 16   Ht 5\' 6"  (1.676 m)   Wt 97.2 kg   LMP 01/30/2019   SpO2 100%   BMI 34.59 kg/m  Gen: alert, oriented Ext: no appreciable swelling in left leg, tenderness in left calf, positive Homan's sign  Patient with high suspicion for DVT given 2 weeks of left calf pain and tenderness on exam, positive Homan's sign. Reviewed risks/benefits of lovenox treatment dosing tonight with plan for venous doppler in am, also to obtain CTA. Reviewed risks/benefits of low radiation versus missing a PE. She is agreeable to proceed with all of the above.   lovenox 100 mg now Venous duplex in am CTA ordered   Appreciate hospitalist care/consult.   Feliz Beam, M.D. [facility]ending [facility] [employer] farm manager)

## 2019-08-26 NOTE — Progress Notes (Signed)
Left lower ext venous  has been completed. Refer to [facility] under chart review to view preliminary results.   08/26/2019  10:29 AM Wylie Coon, Bonnye Fava

## 2019-08-26 NOTE — Progress Notes (Signed)
PROGRESS NOTE    [name]  [id number] DOB: [birth year] DOA: 08/25/2019 PCP: System, Pcp Not In     Brief Narrative:  Meghan Reed is a 33 year old female with past medical history of asthma, peptic ulcer disease who is [redacted] weeks pregnant who has recently moved to [place] from [place] in July 2020 trying to help grandmother and she now presents to the ER with complaints of increasing right upper quadrant pain and right-sided rib cage pain since this 3 AM this morning.  Pain is pleuritic in nature with no associated fever chills cough nausea vomiting or diarrhea.  Last 2 days patient also has been having shortness of breath on exertion and also noted increasing swelling in the lower extremity.  Patient tried taking her albuterol inhaler despite that shortness of breath did not improve.  Patient recently traveled in the car in July to reach [place].  Patient initially thought this may be have peptic ulcer disease and took some Pepcid despite which pain did not get improved.  Given the worsening pain patient came to ER.  In the ER patient had labs done which showed normal LFTs except for low albumin platelets are minimally decreased at 148 hemoglobin 10.9 WBC 9.4 high sensitive troponin was 4 EKG shows normal sinus rhythm chest x-ray unremarkable right upper quadrant sonogram was unremarkable.  On exam patient has right upper quadrant tenderness superficially and also right rib cage tenderness.  Patient stated the pain radiates to right shoulder and is sharp shooting.  COVID-19 test was negative.  Further work-up with CTA chest revealed a small right sided pulmonary embolism.  She was started on Lovenox.  New events last 24 hours / Subjective: Continues to have some pleuritic right posterior chest pain as well as left lower calf swelling and tenderness to palpation.  Breathing is stable.  Assessment & Plan:   Active Problems:   Supervision of high risk pregnancy,  antepartum   RUQ pain   Pulmonary embolism ([place])   Acute pulmonary embolism of the right lower lobe -Agree with obtaining lower extremity Doppler to evaluate for DVT -Patient started on Lovenox -Patient satting 100% on room air without any respiratory distress -Stable for discharge home from medical standpoint after she receives lovenox teaching   [redacted] weeks pregnant -Per OB/GYN service    Antimicrobials:  Anti-infectives (From admission, onward)   None        Objective: Vitals:   08/25/19 1941 08/25/19 2203 08/26/19 0506 08/26/19 0910  BP: 103/63 95/62 (!) 90/51 (!) 86/48  Pulse: 74 75 89 87  Resp: 16 16 17 18   Temp: 98.5 F (36.9 C) 97.8 F (36.6 C) 98 F (36.7 C) 98.4 F (36.9 C)  TempSrc: Oral Oral Oral Oral  SpO2: 100% 100% 100% 100%  Weight:      Height:       No intake or output data in the 24 hours ending 08/26/19 1002 Filed Weights   08/25/19 1129  Weight: 97.2 kg    Examination:  General exam: Appears calm and comfortable  Respiratory system: Clear to auscultation. Respiratory effort normal. No respiratory distress. No conversational dyspnea. On room air  Cardiovascular system: S1 & S2 heard, RRR. No murmurs. No pedal edema. Gastrointestinal system: Abdomen is nondistended, soft and nontender. Normal bowel sounds heard. Central nervous system: Alert and oriented. No focal neurological deficits. Speech clear.  Extremities: Symmetric in appearance, LLE TTP  Skin: No rashes, lesions or ulcers on exposed skin  Psychiatry: Judgement and insight appear  normal. Mood & affect appropriate.   Data Reviewed: I have personally reviewed following labs and imaging studies  CBC: Recent Labs  Lab 08/25/19 1218 08/26/19 0502  WBC 9.4 7.7  NEUTROABS 6.4  --   HGB 10.9* 9.3*  HCT 31.7* 27.8*  MCV 87.1 88.8  PLT 147* [id number]*   Basic Metabolic Panel: Recent Labs  Lab 08/25/19 1218 08/26/19 0502  NA 136 137  K 3.7 3.5  CL 107 105  CO2 20* 24  GLUCOSE 71  89  BUN <5* <5*  CREATININE 0.52 0.61  CALCIUM 8.9 8.5*   GFR: Estimated Creatinine Clearance: 116.5 mL/min (by C-G formula based on SCr of 0.61 mg/dL). Liver Function Tests: Recent Labs  Lab 08/25/19 1218 08/26/19 0502  AST 12* 9*  ALT 10 9  ALKPHOS 47 37*  BILITOT <0.1* 0.3  PROT 6.0* 4.7*  ALBUMIN 2.7* 2.0*   Recent Labs  Lab 08/25/19 1218  LIPASE 30  AMYLASE 56   No results for input(s): AMMONIA in the last 168 hours. Coagulation Profile: No results for input(s): INR, PROTIME in the last 168 hours. Cardiac Enzymes: No results for input(s): CKTOTAL, CKMB, CKMBINDEX, TROPONINI in the last 168 hours. BNP (last 3 results) No results for input(s): PROBNP in the last 8760 hours. HbA1C: No results for input(s): HGBA1C in the last 72 hours. CBG: No results for input(s): GLUCAP in the last 168 hours. Lipid Profile: No results for input(s): CHOL, HDL, LDLCALC, TRIG, CHOLHDL, LDLDIRECT in the last 72 hours. Thyroid Function Tests: No results for input(s): TSH, T4TOTAL, FREET4, T3FREE, THYROIDAB in the last 72 hours. Anemia Panel: No results for input(s): VITAMINB12, FOLATE, FERRITIN, TIBC, IRON, RETICCTPCT in the last 72 hours. Sepsis Labs: No results for input(s): PROCALCITON, LATICACIDVEN in the last 168 hours.  Recent Results (from the past 240 hour(s))  SARS Coronavirus 2 by RT PCR (hospital order, performed in [facility] hospital lab) Nasopharyngeal Nasopharyngeal Swab     Status: None   Collection Time: 08/25/19 12:46 PM   Specimen: Nasopharyngeal Swab  Result Value Ref Range Status   SARS Coronavirus 2 NEGATIVE NEGATIVE Final    Comment: (NOTE) If result is NEGATIVE SARS-CoV-2 target nucleic acids are NOT DETECTED. The SARS-CoV-2 RNA is generally detectable in upper and lower  respiratory specimens during the acute phase of infection. The lowest  concentration of SARS-CoV-2 viral copies this assay can detect is 250  copies / mL. A negative result does not  preclude SARS-CoV-2 infection  and should not be used as the sole basis for treatment or other  patient management decisions.  A negative result may occur with  improper specimen collection / handling, submission of specimen other  than nasopharyngeal swab, presence of viral mutation(s) within the  areas targeted by this assay, and inadequate number of viral copies  (<250 copies / mL). A negative result must be combined with clinical  observations, patient history, and epidemiological information. If result is POSITIVE SARS-CoV-2 target nucleic acids are DETECTED. The SARS-CoV-2 RNA is generally detectable in upper and lower  respiratory specimens dur ing the acute phase of infection.  Positive  results are indicative of active infection with SARS-CoV-2.  Clinical  correlation with patient history and other diagnostic information is  necessary to determine patient infection status.  Positive results do  not rule out bacterial infection or co-infection with other viruses. If result is PRESUMPTIVE POSTIVE SARS-CoV-2 nucleic acids MAY BE PRESENT.   A presumptive positive result was obtained on the submitted  specimen  and confirmed on repeat testing.  While 2019 novel coronavirus  (SARS-CoV-2) nucleic acids may be present in the submitted sample  additional confirmatory testing may be necessary for epidemiological  and / or clinical management purposes  to differentiate between  SARS-CoV-2 and other Sarbecovirus currently known to infect humans.  If clinically indicated additional testing with an alternate test  methodology [phone]) is advised. The SARS-CoV-2 RNA is generally  detectable in upper and lower respiratory sp ecimens during the acute  phase of infection. The expected result is Negative. Fact Sheet for Patients:  StrictlyIdeas.no Fact Sheet for Healthcare Providers: BankingDealers.co.za This test is not yet approved or cleared by  the [place] FDA and has been authorized for detection and/or diagnosis of SARS-CoV-2 by FDA under an Emergency Use Authorization (EUA).  This EUA will remain in effect (meaning this test can be used) for the duration of the COVID-19 declaration under Section 564(b)(1) of the Act, 21 U.S.C. section 360bbb-3(b)(1), unless the authorization is terminated or revoked sooner. Performed at [facility], [place] [address], [place], [place] [address]       Radiology Studies: Ct Angio Chest Pe W Or Wo Contrast  Result Date: 08/26/2019 CLINICAL DATA:  Chest pain EXAM: CT ANGIOGRAPHY CHEST WITH CONTRAST TECHNIQUE: Multidetector CT imaging of the chest was performed using the standard protocol during bolus administration of intravenous contrast. Multiplanar CT image reconstructions and MIPs were obtained to evaluate the vascular anatomy. CONTRAST:  59mL OMNIPAQUE IOHEXOL 350 MG/ML SOLN COMPARISON:  Chest x-ray from the previous day. FINDINGS: Cardiovascular: Thoracic aorta shows no aneurysmal dilatation. No significant cardiac enlargement is seen. The pulmonary artery is well opacified with a normal branching pattern. Small right lower lobe pulmonary arterial embolus is noted best seen on image 173 of series 6 and image 100 of series 8. No other emboli are seen. Mediastinum/Nodes: Thoracic inlet is within normal limits. No hilar or mediastinal adenopathy is noted. The esophagus as visualized is within normal limits. Lungs/Pleura: Lungs are well aerated bilaterally. No focal infiltrate or sizable effusion is noted. Upper Abdomen: Visualized upper abdomen shows no acute abnormality. Musculoskeletal: No acute bony abnormality is seen. Review of the MIP images confirms the above findings. IMPRESSION: Small pulmonary embolus within the right lower lobe as described. No other focal abnormality is seen. Electronically Signed   By: Inez Catalina M.D.   On: 08/26/2019 02:05   Dg Chest Port 1 View  Result  Date: 08/25/2019 CLINICAL DATA:  Shortness of breath and chest pain EXAM: PORTABLE CHEST 1 VIEW COMPARISON:  Mar 13, 2010 FINDINGS: Lungs are clear. The heart size and pulmonary vascularity are normal. No adenopathy. No pneumothorax. No bone lesions. IMPRESSION: No edema or consolidation. Electronically Signed   By: Lowella Grip III M.D.   On: 08/25/2019 13:43   US Abdomen Limited Ruq  Result Date: 08/25/2019 CLINICAL DATA:  Right upper quadrant abdominal pain since yesterday. Third trimester of pregnancy. EXAM: ULTRASOUND ABDOMEN LIMITED RIGHT UPPER QUADRANT COMPARISON:  03/03/2010 CT abdomen/pelvis. FINDINGS: Gallbladder: No gallstones or wall thickening visualized. No sonographic Murphy sign noted by sonographer. Common bile duct: Diameter: 4 mm Liver: Hyperechoic homogeneous 1.3 x 1.2 x 1.2 cm posterior right liver mass. No additional liver lesions. Background liver parenchymal echotexture and echogenicity within normal limits. Portal vein is patent on color Doppler imaging with normal direction of blood flow towards the liver. Other: None. IMPRESSION: 1. No acute abnormality. No cholelithiasis. No evidence of acute cholecystitis. No biliary ductal  dilatation. 2. Hyperechoic small 1.3 cm posterior right liver mass, correlating with a hypodense 1.3 cm mass in this location on noncontrast 03/03/2010 CT abdomen study, compatible with a benign hemangioma. Electronically Signed   By: Ilona Sorrel M.D.   On: 08/25/2019 17:42      Scheduled Meds: . docusate sodium  100 mg Oral Daily  . enoxaparin (LOVENOX) injection  100 mg Subcutaneous Q12H  . enoxaparin   Does not apply Once  . pantoprazole  40 mg Oral Daily  . prenatal multivitamin  1 tablet Oral Q1200   Continuous Infusions: . lactated ringers 125 mL/hr at 08/26/19 0610     LOS: 1 day      Time spent: 35 minutes   Dessa Phi, DO [facility] 08/26/2019, 10:02 AM   Available via Epic secure chat 7am-7pm After these  hours, please refer to coverage provider listed on [email or web address]

## 2019-08-26 NOTE — Progress Notes (Signed)
Discharge instructions given to patient. Discussed follow up appointment time and medication changes. Reviewed signs and symptoms of hypertension. Patient verbalized understanding. IV removed.

## 2019-08-26 NOTE — Discharge Instructions (Signed)
Pulmonary Embolism  A pulmonary embolism (PE) is a sudden blockage or decrease of blood flow in one or both lungs. Most blockages come from a blood clot that forms in the vein of a lower leg, thigh, or arm (deep vein thrombosis, DVT) and travels to the lungs. A clot is blood that has thickened into a gel or solid. PE is a dangerous and life-threatening condition that needs to be treated right away. What are the causes? This condition is usually caused by a blood clot that forms in a vein and moves to the lungs. In rare cases, it may be caused by air, fat, part of a tumor, or other tissue that moves through the veins and into the lungs. What increases the risk? The following factors may make you more likely to develop this condition:  Experiencing a traumatic injury, such as breaking a hip or leg.  Having: ? A spinal cord injury. ? Orthopedic surgery, especially hip or knee replacement. ? Any major surgery. ? A stroke. ? DVT. ? Blood clots or blood clotting disease. ? Long-term (chronic) lung or heart disease. ? Cancer treated with chemotherapy. ? A central venous catheter.  Taking medicines that contain estrogen. These include birth control pills and hormone replacement therapy.  Being: ? Pregnant. ? In the period of time after your baby is delivered (postpartum). ? Older than age 60. ? Overweight. ? A smoker, especially if you have other risks. What are the signs or symptoms? Symptoms of this condition usually start suddenly and include:  Shortness of breath during activity or at rest.  Coughing, coughing up blood, or coughing up blood-tinged mucus.  Chest pain that is often worse with deep breaths.  Rapid or irregular heartbeat.  Feeling light-headed or dizzy.  Fainting.  Feeling anxious.  Fever.  Sweating.  Pain and swelling in a leg. This is a symptom of DVT, which can lead to PE. How is this diagnosed? This condition may be diagnosed based on:  Your medical  history.  A physical exam.  Blood tests.  CT pulmonary angiogram. This test checks blood flow in and around your lungs.  Ventilation-perfusion scan, also called a lung VQ scan. This test measures air flow and blood flow to the lungs.  An ultrasound of the legs. How is this treated? Treatment for this condition depends on many factors, such as the cause of your PE, your risk for bleeding or developing more clots, and other medical conditions you have. Treatment aims to remove, dissolve, or stop blood clots from forming or growing larger. Treatment may include:  Medicines, such as: ? Blood thinning medicines (anticoagulants) to stop clots from forming. ? Medicines that dissolve clots (thrombolytics).  Procedures, such as: ? Using a flexible tube to remove a blood clot (embolectomy) or to deliver medicine to destroy it (catheter-directed thrombolysis). ? Inserting a filter into a large vein that carries blood to the heart (inferior vena cava). This filter (vena cava filter) catches blood clots before they reach the lungs. ? Surgery to remove the clot (surgical embolectomy). This is rare. You may need a combination of immediate, long-term (up to 3 months after diagnosis), and extended (more than 3 months after diagnosis) treatments. Your treatment may continue for several months (maintenance therapy). You and your health care provider will work together to choose the treatment program that is best for you. Follow these instructions at home: Medicines  Take over-the-counter and prescription medicines only as told by your health care provider.  If you   are taking an anticoagulant medicine: ? Take the medicine every day at the same time each day. ? Understand what foods and drugs interact with your medicine. ? Understand the side effects of this medicine, including excessive bruising or bleeding. Ask your health care provider or pharmacist about other side effects. General instructions   Wear a medical alert bracelet or carry a medical alert card that says you have had a PE and lists what medicines you take.  Ask your health care provider when you may return to your normal activities. Avoid sitting or lying for a long time without moving.  Maintain a healthy weight. Ask your health care provider what weight is healthy for you.  Do not use any products that contain nicotine or tobacco, such as cigarettes, e-cigarettes, and chewing tobacco. If you need help quitting, ask your health care provider.  Talk with your health care provider about any travel plans. It is important to make sure that you are still able to take your medicine while on trips.  Keep all follow-up visits as told by your health care provider. This is important. Contact a health care provider if:  You missed a dose of your blood thinner medicine. Get help right away if:  You have: ? New or increased pain, swelling, warmth, or redness in an arm or leg. ? Numbness or tingling in an arm or leg. ? Shortness of breath during activity or at rest. ? A fever. ? Chest pain. ? A rapid or irregular heartbeat. ? A severe headache. ? Vision changes. ? A serious fall or accident, or you hit your head. ? Stomach (abdominal) pain. ? Blood in your vomit, stool, or urine. ? A cut that will not stop bleeding.  You cough up blood.  You feel light-headed or dizzy.  You cannot move your arms or legs.  You are confused or have memory loss. These symptoms may represent a serious problem that is an emergency. Do not wait to see if the symptoms will go away. Get medical help right away. Call your local emergency services (911 in the [place] Do not drive yourself to the hospital. Summary  A pulmonary embolism (PE) is a sudden blockage or decrease of blood flow in one or both lungs. PE is a dangerous and life-threatening condition that needs to be treated right away.  Treatments for this condition usually include  medicines to thin your blood (anticoagulants) or medicines to break apart blood clots (thrombolytics).  If you are given blood thinners, it is important to take the medicine every day at the same time each day.  Understand what foods and drugs interact with any medicines that you are taking.  If you have signs of PE or DVT, call your local emergency services (911 in the [place] This information is not intended to replace advice given to you by your health care provider. Make sure you discuss any questions you have with your health care provider. Document Released: 10/14/2000 Document Revised: 07/25/2018 Document Reviewed: 07/25/2018 Elsevier Patient Education  2020 [employer] Inc.  

## 2019-08-27 ENCOUNTER — Telehealth: Payer: Self-pay | Admitting: *Deleted

## 2019-08-27 DIAGNOSIS — I2699 Other pulmonary embolism without acute cor pulmonale: Secondary | ICD-10-CM

## 2019-08-27 NOTE — Telephone Encounter (Signed)
I called Hematology after placing referral order. They will call patient with appointment. I called [facility] and left a message I am calling about an appointment your Doctor has ordered and you will be getting a call. I will send you a detailed MyChart message. Please read message and if you have questions, please message or call us. Linda,RN

## 2019-08-27 NOTE — Telephone Encounter (Signed)
-----   Message from Sloan Leiter, MD sent at 08/26/2019  7:56 AM EDT ----- Patient admitted to hospital, will not be coming for visit today. She needs to be rescheduled with MD for next week, also needs Hematology consult for new diagnosis of PE in pregnancy. Thanks.

## 2019-08-28 ENCOUNTER — Ambulatory Visit (HOSPITAL_COMMUNITY)
Admission: RE | Admit: 2019-08-28 | Discharge: 2019-08-28 | Disposition: A | Discharge status: Home / Self Care | Payer: Medicaid Other | Source: Ambulatory Visit | Attending: Obstetrics and Gynecology | Admitting: Obstetrics and Gynecology

## 2019-08-28 ENCOUNTER — Encounter (HOSPITAL_COMMUNITY): Payer: Self-pay

## 2019-08-28 ENCOUNTER — Ambulatory Visit (HOSPITAL_COMMUNITY): Payer: Medicaid Other | Admitting: *Deleted

## 2019-08-28 ENCOUNTER — Other Ambulatory Visit: Payer: Self-pay

## 2019-08-28 ENCOUNTER — Other Ambulatory Visit (HOSPITAL_COMMUNITY): Payer: Self-pay | Admitting: *Deleted

## 2019-08-28 DIAGNOSIS — R87618 Other abnormal cytological findings on specimens from cervix uteri: Secondary | ICD-10-CM

## 2019-08-28 DIAGNOSIS — O09899 Supervision of other high risk pregnancies, unspecified trimester: Secondary | ICD-10-CM

## 2019-08-28 DIAGNOSIS — O88213 Thromboembolism in pregnancy, third trimester: Secondary | ICD-10-CM

## 2019-08-28 DIAGNOSIS — O99891 Other specified diseases and conditions complicating pregnancy: Secondary | ICD-10-CM | POA: Diagnosis not present

## 2019-08-28 DIAGNOSIS — O09213 Supervision of pregnancy with history of pre-term labor, third trimester: Secondary | ICD-10-CM | POA: Diagnosis not present

## 2019-08-28 DIAGNOSIS — O0990 Supervision of high risk pregnancy, unspecified, unspecified trimester: Secondary | ICD-10-CM

## 2019-08-28 DIAGNOSIS — R3129 Other microscopic hematuria: Secondary | ICD-10-CM | POA: Diagnosis present

## 2019-08-28 DIAGNOSIS — Z3A30 30 weeks gestation of pregnancy: Secondary | ICD-10-CM

## 2019-08-28 DIAGNOSIS — F4310 Post-traumatic stress disorder, unspecified: Secondary | ICD-10-CM | POA: Insufficient documentation

## 2019-08-28 DIAGNOSIS — A6000 Herpesviral infection of urogenital system, unspecified: Secondary | ICD-10-CM | POA: Insufficient documentation

## 2019-08-28 DIAGNOSIS — J45909 Unspecified asthma, uncomplicated: Secondary | ICD-10-CM

## 2019-08-28 DIAGNOSIS — F419 Anxiety disorder, unspecified: Secondary | ICD-10-CM

## 2019-08-29 ENCOUNTER — Telehealth: Payer: Self-pay | Admitting: Hematology and Oncology

## 2019-08-29 NOTE — Telephone Encounter (Signed)
A new hem appt has been scheduled for [name] to see Dr. Lorenso Courier on 11/6 at 10am. Pt aware to arrive 15 minutes early.

## 2019-09-04 ENCOUNTER — Ambulatory Visit (INDEPENDENT_AMBULATORY_CARE_PROVIDER_SITE_OTHER): Payer: Medicaid Other | Admitting: Obstetrics and Gynecology

## 2019-09-04 ENCOUNTER — Encounter: Payer: Self-pay | Admitting: Obstetrics and Gynecology

## 2019-09-04 ENCOUNTER — Other Ambulatory Visit: Payer: Self-pay

## 2019-09-04 VITALS — BP 112/68 | HR 101 | Wt 212.6 lb

## 2019-09-04 DIAGNOSIS — O09893 Supervision of other high risk pregnancies, third trimester: Secondary | ICD-10-CM

## 2019-09-04 DIAGNOSIS — A6000 Herpesviral infection of urogenital system, unspecified: Secondary | ICD-10-CM

## 2019-09-04 DIAGNOSIS — Z3A31 31 weeks gestation of pregnancy: Secondary | ICD-10-CM

## 2019-09-04 DIAGNOSIS — O09899 Supervision of other high risk pregnancies, unspecified trimester: Secondary | ICD-10-CM

## 2019-09-04 DIAGNOSIS — O0993 Supervision of high risk pregnancy, unspecified, third trimester: Secondary | ICD-10-CM

## 2019-09-04 DIAGNOSIS — O0990 Supervision of high risk pregnancy, unspecified, unspecified trimester: Secondary | ICD-10-CM

## 2019-09-04 DIAGNOSIS — I2699 Other pulmonary embolism without acute cor pulmonale: Secondary | ICD-10-CM

## 2019-09-04 NOTE — Progress Notes (Signed)
BTL consent signed. 

## 2019-09-04 NOTE — Patient Instructions (Signed)
AREA PEDIATRIC/FAMILY PRACTICE PHYSICIANS  [place] ([address]) . [facility] o Chambliss, MD; Eniola, MD; Hale, MD; Hensel, MD; McDiarmid, MD; McIntyer, MD; Neal, MD; Walden, MD o [address] [place], [place] [address] o [phone] o Mon-Fri 8:30-12:30, 1:30-5:00 o Providers come to see babies at [facility] o Accepting Medicaid . [facility] o Limited providers who accept newborns: Koirala, MD; Morrow, MD; Wolters, MD o [address], [place], [place] [address] o [phone] o Mon-Fri 8:00-5:30 o Babies seen by providers at [facility] o Does NOT accept Medicaid o Please call early in hospitalization for appointment (limited availability)  . [facility] o Mulberry, MD o [address] [place], [place] [address] o [phone] o Mon, Tue, Thur, Fri 8:30-5:00, Wed 10:00-7:00 (closed 1-2pm) o Babies seen by [facility] providers o Accepting Medicaid . [facility] Pediatrician o Rubin, MD o [address], [place], [place] [address] o [phone] o Mon-Fri 8:30-5:00, Sat 8:30-12:00 o Provider comes to see babies at [facility] o Accepting Medicaid o Must have been referred from current patients or contacted office prior to delivery . [facility] ([facility]) o Brown, MD; Chandler, MD; Ettefagh, MD; Grant, MD; Lester, MD; McCormick, MD; McQueen, MD; Prose, MD; Simha, MD; Stanley, MD; Stryffeler, NP; Tebben, NP o [address], [place], [place] [address] o [phone] o Mon, Tue, Thur, Fri 8:30-5:30, Wed 9:30-5:30, Sat 8:30-12:30 o Babies seen by [facility] providers o Accepting Medicaid o Only accepting infants of first-time parents or siblings of current patients o Hospital discharge coordinator will make follow-up appointment . Jack Amos o [address],  [place], [place]  [address] o [phone]   Fax - [phone] . [facility] o [address], [place], [place]  [address] o Phone - [phone]   Fax - [phone] . Shilpa Gosrani o [address], [place], [place]  [address] o [phone]  [place] ([address]) . [facility] o Bates, MD; Brassfield, MD; Cooper, Cox, MD; MD; Davis, MD; Dovico, MD; Ettefaugh, MD; Little, MD; Lowe, MD; Keiffer, MD; Melvin, MD; Sumner, MD; Williams, MD o [address], [place], [place] [address] o [phone] o Mon-Fri 8:30-5:00 (extended evenings Mon-Thur as needed), Sat-Sun 10:00-1:00 o Providers come to see babies at [facility] o Accepting Medicaid for families of first-time babies and families with all children in the household age 3 and under. Must register with office prior to making appointment (M-F only). . [facility] o Henson, NP; Knapp, MD; Lalonde, MD; Tysinger, PA o [address] [place], [place] [address] o [phone] o Mon-Fri 8:00-5:00 o Babies seen by providers at [facility] o Does NOT accept Medicaid/Commercial Insurance Only . [facility] ([facility])  o Artis, MD; Barnes, MD; Bratton, MD; Coccaro, MD; Lockett Gardner, MD; Kramer, MD; Marshall, MD; Netherton, MD; Poleto, MD; Skinner, MD o [address] [place], [place] [address] o [phone] o Mon-Fri 8:30-5:30, Sat (Oct.-Mar.) 9:00-1:00 o Babies seen by providers at [facility] o Accepting Medicaid  [place] ([address]) . [facility] o Reid, MD; Warner, MD o [address], [place],  [address] o [phone] o Mon-Fri 8:30-5:00, Sat 8:30-12:00 o Providers come to see babies at [facility] o Does NOT accept Medicaid . [facility] o Becker, PA; Hagler, MD; Scifres, PA; Sun, MD; Swayne, MD o [address],  [place], [place] [address] o [phone] o Mon-Fri 8:00-5:00 o Babies seen by providers at [facility] o Does NOT accept Medicaid o Only accepting babies of parents who are patients o Please call early in hospitalization for appointment (limited availability) . [facility] o Clark, MD; Frye, MD; Kelleher, MD; Mack, NP; Miller, MD; O'Keller, MD; Patterson, NP; Pudlo, MD; Puzio, MD; Thomas, MD; Tucker, MD; Twiselton, MD o [address], [place], [place] [address] o [phone] o Mon-Fri 8:00-5:00, Sat 9:00-12:00 o Providers come to see babies at [facility] o Does NOT accept Medicaid  [place] ([address]) . [facility] o Limited providers accepting new patients: Brake, NP; Wharton, PA o [address], [place], [place] [address] o [phone] o Mon-Fri 8:00-5:00 o Babies seen by providers at [facility] o Does NOT accept Medicaid o Only accepting babies of parents who are patients o Please call early in hospitalization for appointment (limited availability) . [facility] o Gay, MD; Quinlan, MD o [address] [place], [place] [address] o [phone] (press [phone] to schedule appointment) o Mon-Fri 8:00-5:00 o Providers come to see babies at [facility] o Does NOT accept Medicaid . [facility] o Mazer, MD o [address] [place], [place] [address] o [phone] o Mon-Fri 8:30-5:00 (lunch 12:30-1:00), extended hours by appointment only Wed 5:00-6:30 o Babies seen by [facility] providers o Accepting Medicaid . [facility] o Banks, MD; Jordan, MD; Koberlein, MD o [address], [place], [place] [address] o [phone] o Mon-Fri 8:00-5:00 o Babies seen by [facility] providers o Does NOT accept Medicaid . [facility] o Parker, MD; Hunter, MD; Wallace, DO o [address] [place], [place]  [address] o [phone] o Mon-Fri 8:00-5:00 o Babies seen by [facility] providers o Does NOT accept Medicaid . [facility] o Brandon, PA; Brecken, PA; Christy, NP; Dees, MD; DeClaire, MD; DeWeese, MD; Hansen, NP; Mills, NP; Parrish, NP; Smoot, NP; Summer, MD; Vapne, MD o [address] [place], [place] [address] o [phone] o Mon-Fri 8:30-5:00, Sat 10:00-1:00 o Providers come to see babies at [facility] o Does NOT accept Medicaid o Free prenatal information session Tuesdays at 4:45pm . [facility] o Bouska, MD; Gordon, PA; Jeffery, PA; Weber, PA o [address] [place] [address] o [phone] o Mon-Fri 7:30-5:30 o Babies seen by [facility] providers . [facility] o [address], [place], [place]  [address] o [phone]   Fax - [phone]  [place] ([address] & [address]) . [facility] o Reese, MD o [address] [place], [place] [address] o [phone] o Mon-Thur 8:00-6:00 o Providers come to see babies at [facility] o Accepting Medicaid . [facility] o Anderson, NP; Badger, MD; Beal, PA; Spencer, PA o [address] [place], [place] [address] o [phone] o Mon-Thur 7:30-7:30, Fri 7:30-4:30 o Babies seen by [facility] providers o Accepting Medicaid . [facility] o Agbuya, MD; Klett, NP; Romgoolam, MD o [address], [place], [place] [address] o [phone] o Mon-Fri 8:30-5:00, Sat 8:30-12:00 o Providers come to see babies at [facility] o Accepting Medicaid o Must have "Meet & Greet" appointment at office prior to delivery . [facility] ([facility] o McCord,   MD; Wallace, MD; Wood, MD o [address], [place], [place] [address] o [phone] o Mon-Wed 8:00-6:00, Thur-Fri 8:00-5:00, Sat 9:00-12:00 o Providers come to  see babies at [facility] o Does NOT accept Medicaid o Only accepting siblings of current patients . [facility]  o [address], [place], [place]  [address] o [phone]   Fax - [phone] . [facility] o [address], [place], [place]  [address] o [phone]   Fax - [phone]  [place] ([address] & [address]) . [facility] o Cirigliano, DO; Matthews, DO o [address] [place], [place] [address] o [phone] o Mon-Fri 7:00-5:00 o Babies seen by [facility] providers o Does NOT accept Medicaid . [facility] o Briscoe, MD; Howley, PA; Moreira, PA o [address], [place], [place] [address] o [phone] o Mon-Fri 8:00-5:00 o Babies seen by [facility] providers o Accepting Medicaid . [facility] o Boyd, MD; Church, PA; Jones, NP; Osborn, PA o [address], [place], [place] [address] o [phone] o Mon-Fri 8:00-5:00 o Babies seen by providers at [facility] o Accepting Medicaid  [place] ([address]) . [facility] [place] o Wendling, DO o [address] [place], [place] [address] o [phone] o Mon-Fri 8:00-5:00 o Babies seen by [facility] providers o Does NOT accept Medicaid o Limited availability, please call early in hospitalization to schedule follow-up . [facility] o Calderon, PA; Cummings, MD; Dillard, MD; Martin, PA; Olson, MD; VanDeven, PA o [address], [place], [place] [address] o [phone] o Mon-Fri 8:30-5:00, Sat 9:00-12:00 o Babies seen by providers at [facility] o Accepting Medicaid o Please register online then schedule online or call office o [email or web address] . [facility] ([facility] o Hunter, NP; Kumar, MD; Martin Rogers, PA o [address], [place], [place] [address] o [phone] o Mon-Fri 8:00-5:00 o Babies seen by providers at [facility] o Accepting Medicaid . [facility] ([facility]) o [place], MD; Kristi Fleenor, NP; West, MD o [address], [place], [place] [address] o [phone] o Mon-Fri 8:00-5:30, Sat&Sun by appointment (phones open at 8:30) o Babies seen by [facility] providers o Accepting Medicaid o Must be a first-time baby or sibling of current patient . [facility] [place]  o [address], [place], [place]  [address] o [phone]   Fax - [phone]  [place] ([address]) . [facility] o Brown, PA; Cowen, PA; Rice, MD; Helton, PA; Spry, MD o [address] [place], [place] [address] o [phone] o Mon-Thur 8:00-7:00, Fri 8:00-5:00, Sat 8:00-12:00, Sun 9:00-12:00 o Babies seen by [facility] providers o Accepting Medicaid . [facility] [place] o Coe-Goins, MD; Marshall, MD; Pierre-Louis, MD o [address] [place] [address], [place], [place] [address] o [phone] o Mon-Thur 8:00-5:00 o Babies seen by providers at [facility] o Accepting Medicaid . [facility] o Bratton, MD; Coe-Goins, MD; Hayes, MD; Lewis, MD; List, MD; Lott, MD; Marshall, MD; Moran, MD; O'Neal, MD; Pierre-Louis, MD; Pitonzo, MD; Scholer, MD; Spangle, MD o [address] [place], [place]   [address] o [phone] o Mon-Fri 8:00-5:30, Sat (Oct.-Mar.) 9:00-1:00 o Babies seen by providers at [facility] o Accepting Medicaid o Must fill out new patient packet, available online at [email or web address] . [facility] ([facility] o Friddle, NP; Harris, NP; Kelly, NP; Logan, MD;  Melvin, PA; Poth, MD; Ramadoss, MD; Stanton, NP o [address], [place], [place] [address] o [phone] o Mon-Thur 8:00-5:30, Fri 8:00-5:00 o Babies seen by providers at [facility] o Accepting Medicaid  [place] ([address]) . [facility] o Dixon, PA; Bronx, MD; Pickard, MD; Tapia, PA o [address], [place], [place] [address] o [phone] o Mon-Fri 8:00-5:00 o Babies seen by providers at [facility] o Accepting Medicaid   [place] ([address]) . [facility] [place] o Masneri, DO; Meyers, MD; Nelson, PA o [address], [place], [place] [address] o [phone] o Mon-Fri 8:00-5:00 o Babies seen by providers at [facility] o Does NOT accept Medicaid o Limited appointment availability, please call early in hospitalization  . [facility] [place] o Kunedd, DO; McGowen, MD o [address], [place], [place] [address] o [phone] o Mon-Fri 8:00-5:00 o Babies seen by [facility] providers o Does NOT accept Medicaid . [facility] [place] o Cameron, MD; MacDonald, MD; Michaels, PA; Nayak, MD o [address] [place] [address], [place], [place] [address] o [phone] o Mon-Fri 8:00-5:00 o After hours clinic ([address] [place], [place] [address]) [phone] Mon-Fri 5:00-8:00, Sat 12:00-6:00, Sun 10:00-4:00 o Babies seen by [facility] providers o Accepting Medicaid . [facility] [place] o [address], [place], [place]  [address] o [phone]   Fax - [phone]  [place] ([address]) . [facility] [place] [facility] o Andy, MD o [address], [place], [place] [address] o [phone] o Mon-Fri 8:00-5:00 o Babies seen by [facility] providers o Does NOT accept Medicaid . [facility] [place] ([facility] [place]) o Eksir, MD o [address], [place], [place]  [address] o [phone] o Mon-Thur 8:00-7:00, Fri 8:00-5:00, Sat 8:00-12:00 o Babies seen by providers at [facility] o Accepting Medicaid - but does not have vaccinations in office (must be received elsewhere) o Limited availability, please call early in hospitalization  [place] ([address]) . [facility]  o Charlene Flemming, MD o [address], [place] [address] o [phone]  Fax [phone]   
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
## 2019-09-05 NOTE — Progress Notes (Signed)
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 evidence they are not genuinely low). --continue to monitor platelet count.   Orders Placed This Encounter  Procedures   CBC with Differential ([place] Only)    Standing Status:   Future    Number of Occurrences:   1    Standing Expiration Date:   09/05/2020   Retic Panel    Standing Status:   Future    Number of Occurrences:   1    Standing Expiration Date:   09/05/2020   Save Smear (SSMR)    Standing Status:   Future    Number of Occurrences:   1    Standing Expiration Date:   09/05/2020   Iron and TIBC    Standing Status:   Future    Number of Occurrences:   1    Standing Expiration Date:   09/05/2020   Ferritin     Standing Status:   Future    Number of Occurrences:   1    Standing Expiration Date:   09/05/2020   CMP ([place] only)    Standing Status:   Future    Number of Occurrences:   1    Standing Expiration Date:   09/05/2020    All questions were answered. The patient knows to call the clinic with any problems, questions or concerns.  A total of more than 60 minutes were spent face-to-face with the patient during this encounter and over half of that time was spent on counseling and coordination of care as outlined above.   [name], MD Department of Hematology/Oncology [place] at [facility] Phone: [phone] Pager: [phone] Email: [email or web address]   09/06/2019 7:41 PM   Literature Support:  How I treat pregnancy-related venous thromboembolism. Saskia [place]. Blood (2011) 118 (20): [id number].  --The management of pregnancy-related VTE is based on extrapolation from the nonpregnant population, and clinical trial data for the optimal treatment are not available. LMWH in therapeutic doses is the treatment of choice during pregnancy, and anticoagulation (LMWH or vitamin K antagonists postpartum) should be continued until 6 weeks after delivery with a minimum total duration of 3 months.

## 2019-09-06 ENCOUNTER — Encounter: Payer: Self-pay | Admitting: Hematology and Oncology

## 2019-09-06 ENCOUNTER — Inpatient Hospital Stay: Payer: Medicaid Other

## 2019-09-06 ENCOUNTER — Other Ambulatory Visit: Payer: Self-pay

## 2019-09-06 ENCOUNTER — Inpatient Hospital Stay: Payer: Medicaid Other | Attending: Hematology and Oncology | Admitting: Hematology and Oncology

## 2019-09-06 VITALS — BP 104/64 | HR 100 | Temp 98.3°F | Resp 18 | Ht 66.0 in | Wt 213.9 lb

## 2019-09-06 DIAGNOSIS — Z800 Family history of malignant neoplasm of digestive organs: Secondary | ICD-10-CM | POA: Diagnosis not present

## 2019-09-06 DIAGNOSIS — Z801 Family history of malignant neoplasm of trachea, bronchus and lung: Secondary | ICD-10-CM | POA: Diagnosis not present

## 2019-09-06 DIAGNOSIS — Z832 Family history of diseases of the blood and blood-forming organs and certain disorders involving the immune mechanism: Secondary | ICD-10-CM | POA: Diagnosis not present

## 2019-09-06 DIAGNOSIS — D6959 Other secondary thrombocytopenia: Secondary | ICD-10-CM | POA: Diagnosis not present

## 2019-09-06 DIAGNOSIS — Z808 Family history of malignant neoplasm of other organs or systems: Secondary | ICD-10-CM | POA: Diagnosis not present

## 2019-09-06 DIAGNOSIS — O99013 Anemia complicating pregnancy, third trimester: Secondary | ICD-10-CM | POA: Diagnosis not present

## 2019-09-06 DIAGNOSIS — F4310 Post-traumatic stress disorder, unspecified: Secondary | ICD-10-CM | POA: Insufficient documentation

## 2019-09-06 DIAGNOSIS — Z9181 History of falling: Secondary | ICD-10-CM | POA: Insufficient documentation

## 2019-09-06 DIAGNOSIS — Z803 Family history of malignant neoplasm of breast: Secondary | ICD-10-CM | POA: Insufficient documentation

## 2019-09-06 DIAGNOSIS — Z3A31 31 weeks gestation of pregnancy: Secondary | ICD-10-CM | POA: Insufficient documentation

## 2019-09-06 DIAGNOSIS — O99113 Other diseases of the blood and blood-forming organs and certain disorders involving the immune mechanism complicating pregnancy, third trimester: Secondary | ICD-10-CM | POA: Insufficient documentation

## 2019-09-06 DIAGNOSIS — O88213 Thromboembolism in pregnancy, third trimester: Secondary | ICD-10-CM | POA: Diagnosis present

## 2019-09-06 DIAGNOSIS — Z87891 Personal history of nicotine dependence: Secondary | ICD-10-CM | POA: Diagnosis not present

## 2019-09-06 DIAGNOSIS — D649 Anemia, unspecified: Secondary | ICD-10-CM | POA: Diagnosis not present

## 2019-09-06 DIAGNOSIS — D509 Iron deficiency anemia, unspecified: Secondary | ICD-10-CM | POA: Insufficient documentation

## 2019-09-06 DIAGNOSIS — I2699 Other pulmonary embolism without acute cor pulmonale: Secondary | ICD-10-CM

## 2019-09-06 LAB — CMP (CANCER CENTER ONLY)
ALT: 10 U/L (ref 0–44)
AST: 9 U/L — ABNORMAL LOW (ref 15–41)
Albumin: 2.8 g/dL — ABNORMAL LOW (ref 3.5–5.0)
Alkaline Phosphatase: 58 U/L (ref 38–126)
Anion gap: 7 (ref 5–15)
BUN: 8 mg/dL (ref 6–20)
CO2: 23 mmol/L (ref 22–32)
Calcium: 8.7 mg/dL — ABNORMAL LOW (ref 8.9–10.3)
Chloride: 105 mmol/L (ref 98–111)
Creatinine: 0.64 mg/dL (ref 0.44–1.00)
GFR, Est AFR Am: >60 mL/min (ref >60)
GFR, Estimated: >60 mL/min (ref >60)
Glucose, Bld: 97 mg/dL (ref 70–99)
Potassium: 4.2 mmol/L (ref 3.5–5.1)
Sodium: 135 mmol/L (ref 135–145)
Total Bilirubin: <0.2 mg/dL — ABNORMAL LOW (ref 0.3–1.2)
Total Protein: 6.3 g/dL — ABNORMAL LOW (ref 6.5–8.1)

## 2019-09-06 LAB — CBC WITH DIFFERENTIAL (CANCER CENTER ONLY)
Abs Immature Granulocytes: 0.28 10*3/uL — ABNORMAL HIGH (ref 0.00–0.07)
Basophils Absolute: 0 10*3/uL (ref 0.0–0.1)
Basophils Relative: 0 %
Eosinophils Absolute: 0.2 10*3/uL (ref 0.0–0.5)
Eosinophils Relative: 2 %
HCT: 32 % — ABNORMAL LOW (ref 36.0–46.0)
Hemoglobin: 10.6 g/dL — ABNORMAL LOW (ref 12.0–15.0)
Immature Granulocytes: 3 %
Lymphocytes Relative: 18 %
Lymphs Abs: 1.5 10*3/uL (ref 0.7–4.0)
MCH: 29.4 pg (ref 26.0–34.0)
MCHC: 33.1 g/dL (ref 30.0–36.0)
MCV: 88.6 fL (ref 80.0–100.0)
Monocytes Absolute: 0.6 10*3/uL (ref 0.1–1.0)
Monocytes Relative: 7 %
Neutro Abs: 5.7 10*3/uL (ref 1.7–7.7)
Neutrophils Relative %: 70 %
Platelet Count: 139 10*3/uL — ABNORMAL LOW (ref 150–400)
RBC: 3.61 MIL/uL — ABNORMAL LOW (ref 3.87–5.11)
RDW: 13.4 % (ref 11.5–15.5)
WBC Count: 8.2 10*3/uL (ref 4.0–10.5)
nRBC: 0.4 % — ABNORMAL HIGH (ref 0.0–0.2)

## 2019-09-06 LAB — IRON AND TIBC
Iron: 57 ug/dL (ref 41–142)
Saturation Ratios: 12 % — ABNORMAL LOW (ref 21–57)
TIBC: 458 ug/dL — ABNORMAL HIGH (ref 236–444)
UIBC: 400 ug/dL — ABNORMAL HIGH (ref 120–384)

## 2019-09-06 LAB — RETIC PANEL
Immature Retic Fract: 30.9 % — ABNORMAL HIGH (ref 2.3–15.9)
RBC.: 3.64 MIL/uL — ABNORMAL LOW (ref 3.87–5.11)
Retic Count, Absolute: 89.5 10*3/uL (ref 19.0–186.0)
Retic Ct Pct: 2.5 % (ref 0.4–3.1)
Reticulocyte Hemoglobin: 31.6 pg (ref >27.9)

## 2019-09-06 LAB — SAVE SMEAR(SSMR), FOR PROVIDER SLIDE REVIEW

## 2019-09-06 LAB — FERRITIN: Ferritin: <4 ng/mL — ABNORMAL LOW (ref 11–307)

## 2019-09-10 ENCOUNTER — Other Ambulatory Visit: Payer: Self-pay | Admitting: Hematology and Oncology

## 2019-09-10 ENCOUNTER — Telehealth: Payer: Self-pay | Admitting: *Deleted

## 2019-09-10 ENCOUNTER — Telehealth: Payer: Self-pay | Admitting: Hematology and Oncology

## 2019-09-10 DIAGNOSIS — D509 Iron deficiency anemia, unspecified: Secondary | ICD-10-CM

## 2019-09-10 NOTE — Telephone Encounter (Signed)
Called Meghan Reed to discuss the results of her labs from last week. Most concerning is her markedly low iron levels. Given her anemia and low iron we will scheduled her for treatment with IV iron (preferred method of repletion past 30 weeks).   She voiced her understanding of the findings and the plan to administer IV iron.   All questions and concerns were addressed. She was instructed to call us back if she does not receive word about the IV iron by the end of the week.  Meghan Peoples, MD Department of Hematology/Oncology [place] at [facility] Phone: [phone] Pager: [phone] Email: [email or web address]

## 2019-09-10 NOTE — Telephone Encounter (Signed)
TCT patient to advise her of her appt to receive IV Fereheme. This will be done @ [facility] on 09/17/19 @ 10 am. They will schedule her 2nd infusion after completing the 1st one on 09/17/19  Pt voiced understanding. Provided address and phone # to this facility to Meghan Reed.  She voiced understanding.

## 2019-09-11 ENCOUNTER — Encounter: Payer: Self-pay | Admitting: Obstetrics and Gynecology

## 2019-09-11 ENCOUNTER — Ambulatory Visit (INDEPENDENT_AMBULATORY_CARE_PROVIDER_SITE_OTHER): Payer: Medicaid Other | Admitting: Obstetrics and Gynecology

## 2019-09-11 ENCOUNTER — Ambulatory Visit: Payer: Self-pay

## 2019-09-11 ENCOUNTER — Other Ambulatory Visit (HOSPITAL_COMMUNITY)
Admission: RE | Admit: 2019-09-11 | Discharge: 2019-09-11 | Disposition: A | Discharge status: Home / Self Care | Payer: Medicaid Other | Source: Ambulatory Visit | Attending: Obstetrics and Gynecology | Admitting: Obstetrics and Gynecology

## 2019-09-11 ENCOUNTER — Other Ambulatory Visit: Payer: Self-pay

## 2019-09-11 ENCOUNTER — Ambulatory Visit (INDEPENDENT_AMBULATORY_CARE_PROVIDER_SITE_OTHER): Payer: Medicaid Other | Admitting: General Practice

## 2019-09-11 VITALS — BP 112/76 | HR 111 | Wt 214.0 lb

## 2019-09-11 DIAGNOSIS — O0990 Supervision of high risk pregnancy, unspecified, unspecified trimester: Secondary | ICD-10-CM

## 2019-09-11 DIAGNOSIS — O09899 Supervision of other high risk pregnancies, unspecified trimester: Secondary | ICD-10-CM

## 2019-09-11 DIAGNOSIS — O0993 Supervision of high risk pregnancy, unspecified, third trimester: Secondary | ICD-10-CM

## 2019-09-11 DIAGNOSIS — N898 Other specified noninflammatory disorders of vagina: Secondary | ICD-10-CM | POA: Diagnosis present

## 2019-09-11 DIAGNOSIS — O09213 Supervision of pregnancy with history of pre-term labor, third trimester: Secondary | ICD-10-CM

## 2019-09-11 DIAGNOSIS — D508 Other iron deficiency anemias: Secondary | ICD-10-CM

## 2019-09-11 DIAGNOSIS — Z3009 Encounter for other general counseling and advice on contraception: Secondary | ICD-10-CM

## 2019-09-11 DIAGNOSIS — A6000 Herpesviral infection of urogenital system, unspecified: Secondary | ICD-10-CM

## 2019-09-11 DIAGNOSIS — Z3A32 32 weeks gestation of pregnancy: Secondary | ICD-10-CM

## 2019-09-11 DIAGNOSIS — O09893 Supervision of other high risk pregnancies, third trimester: Secondary | ICD-10-CM

## 2019-09-11 DIAGNOSIS — I2693 Single subsegmental pulmonary embolism without acute cor pulmonale: Secondary | ICD-10-CM

## 2019-09-11 NOTE — Progress Notes (Signed)
Pt states having thick d/c thinks it may be Yeast or BV

## 2019-09-11 NOTE — Progress Notes (Signed)
Pt informed that the ultrasound is considered a limited OB ultrasound and is not intended to be a complete ultrasound exam.  Patient also informed that the ultrasound is not being completed with the intent of assessing for fetal or placental anomalies or any pelvic abnormalities.  Explained that the purpose of today's ultrasound is to assess for  BPP, presentation and AFI.  Patient acknowledges the purpose of the exam and the limitations of the study.     Koren Bound RN BSN 09/11/19
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
## 2019-09-12 LAB — CERVICOVAGINAL ANCILLARY ONLY
Bacterial Vaginitis (gardnerella): NEGATIVE
Candida Glabrata: NEGATIVE
Candida Vaginitis: NEGATIVE
Comment: NEGATIVE
Comment: NEGATIVE
Comment: NEGATIVE

## 2019-09-16 ENCOUNTER — Other Ambulatory Visit (HOSPITAL_COMMUNITY): Payer: Self-pay | Admitting: *Deleted

## 2019-09-16 DIAGNOSIS — A6000 Herpesviral infection of urogenital system, unspecified: Secondary | ICD-10-CM

## 2019-09-16 MED ORDER — VALACYCLOVIR HCL 1 G PO TABS: 1000.0000 mg | ORAL_TABLET | Freq: Two times a day (BID) | ORAL | 0 refills | Status: AC

## 2019-09-16 NOTE — Progress Notes (Signed)
I have reviewed this chart and agree with the RN/CMA assessment and management.    K. Meryl Karmel Patricelli, M.D. Attending [facility] (Faculty Practice)   

## 2019-09-17 ENCOUNTER — Other Ambulatory Visit: Payer: Self-pay

## 2019-09-17 ENCOUNTER — Telehealth: Payer: Self-pay | Admitting: Family Medicine

## 2019-09-17 ENCOUNTER — Ambulatory Visit (HOSPITAL_COMMUNITY)
Admission: RE | Admit: 2019-09-17 | Discharge: 2019-09-17 | Disposition: A | Discharge status: Home / Self Care | Payer: Medicaid Other | Source: Ambulatory Visit | Attending: Hematology and Oncology | Admitting: Hematology and Oncology

## 2019-09-17 DIAGNOSIS — O99013 Anemia complicating pregnancy, third trimester: Secondary | ICD-10-CM | POA: Insufficient documentation

## 2019-09-17 DIAGNOSIS — Z3A00 Weeks of gestation of pregnancy not specified: Secondary | ICD-10-CM | POA: Insufficient documentation

## 2019-09-17 DIAGNOSIS — D508 Other iron deficiency anemias: Secondary | ICD-10-CM | POA: Insufficient documentation

## 2019-09-17 MED ORDER — SODIUM CHLORIDE 0.9 % IV SOLN
510.0000 mg | INTRAVENOUS | Status: DC
  Administered 2019-09-17: 11:00:00 510 mg via INTRAVENOUS
  Filled 2019-09-17: qty 510

## 2019-09-17 NOTE — Telephone Encounter (Signed)
Spoke to patient about her appointment on 11/18 @ 10:15. Patient instructed to wear a face mask for the entire appointment and no visitors are allowed with her during the visit. Patient screened for covid symptoms and denied having any

## 2019-09-17 NOTE — Discharge Instructions (Signed)

## 2019-09-18 ENCOUNTER — Ambulatory Visit: Payer: Self-pay

## 2019-09-18 ENCOUNTER — Other Ambulatory Visit: Payer: Self-pay

## 2019-09-18 ENCOUNTER — Ambulatory Visit (INDEPENDENT_AMBULATORY_CARE_PROVIDER_SITE_OTHER): Payer: Medicaid Other | Admitting: *Deleted

## 2019-09-18 VITALS — BP 101/65 | HR 122 | Wt 212.1 lb

## 2019-09-18 DIAGNOSIS — O0993 Supervision of high risk pregnancy, unspecified, third trimester: Secondary | ICD-10-CM | POA: Diagnosis present

## 2019-09-18 DIAGNOSIS — I2693 Single subsegmental pulmonary embolism without acute cor pulmonale: Secondary | ICD-10-CM

## 2019-09-18 DIAGNOSIS — Z3A33 33 weeks gestation of pregnancy: Secondary | ICD-10-CM | POA: Diagnosis not present

## 2019-09-18 NOTE — Progress Notes (Signed)

## 2019-09-23 ENCOUNTER — Ambulatory Visit: Payer: Self-pay

## 2019-09-23 ENCOUNTER — Ambulatory Visit (INDEPENDENT_AMBULATORY_CARE_PROVIDER_SITE_OTHER): Payer: Medicaid Other | Admitting: *Deleted

## 2019-09-23 ENCOUNTER — Other Ambulatory Visit: Payer: Self-pay

## 2019-09-23 ENCOUNTER — Ambulatory Visit (INDEPENDENT_AMBULATORY_CARE_PROVIDER_SITE_OTHER): Payer: Medicaid Other | Admitting: Obstetrics and Gynecology

## 2019-09-23 VITALS — BP 97/68 | HR 133 | Wt 214.0 lb

## 2019-09-23 DIAGNOSIS — I2699 Other pulmonary embolism without acute cor pulmonale: Secondary | ICD-10-CM

## 2019-09-23 DIAGNOSIS — A6000 Herpesviral infection of urogenital system, unspecified: Secondary | ICD-10-CM

## 2019-09-23 DIAGNOSIS — Z3A35 35 weeks gestation of pregnancy: Secondary | ICD-10-CM

## 2019-09-23 DIAGNOSIS — O0993 Supervision of high risk pregnancy, unspecified, third trimester: Secondary | ICD-10-CM

## 2019-09-23 DIAGNOSIS — O09899 Supervision of other high risk pregnancies, unspecified trimester: Secondary | ICD-10-CM

## 2019-09-23 DIAGNOSIS — O09893 Supervision of other high risk pregnancies, third trimester: Secondary | ICD-10-CM

## 2019-09-23 DIAGNOSIS — O98513 Other viral diseases complicating pregnancy, third trimester: Secondary | ICD-10-CM

## 2019-09-23 DIAGNOSIS — Z8759 Personal history of other complications of pregnancy, childbirth and the puerperium: Secondary | ICD-10-CM

## 2019-09-23 DIAGNOSIS — R87618 Other abnormal cytological findings on specimens from cervix uteri: Secondary | ICD-10-CM

## 2019-09-23 DIAGNOSIS — O0990 Supervision of high risk pregnancy, unspecified, unspecified trimester: Secondary | ICD-10-CM

## 2019-09-23 DIAGNOSIS — O329XX0 Maternal care for malpresentation of fetus, unspecified, not applicable or unspecified: Secondary | ICD-10-CM

## 2019-09-23 NOTE — Progress Notes (Signed)
Pt states she feels intermittent sharp abdominal pain when she is moving about.  [place] for growth/BPP scheduled 12/1.
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
## 2019-09-24 ENCOUNTER — Encounter: Payer: Medicaid Other | Admitting: Family Medicine

## 2019-09-24 ENCOUNTER — Ambulatory Visit (HOSPITAL_COMMUNITY)
Admission: RE | Admit: 2019-09-24 | Discharge: 2019-09-24 | Disposition: A | Discharge status: Home / Self Care | Payer: Medicaid Other | Source: Ambulatory Visit | Attending: Hematology and Oncology | Admitting: Hematology and Oncology

## 2019-09-24 DIAGNOSIS — Z3A00 Weeks of gestation of pregnancy not specified: Secondary | ICD-10-CM | POA: Insufficient documentation

## 2019-09-24 DIAGNOSIS — O99019 Anemia complicating pregnancy, unspecified trimester: Secondary | ICD-10-CM | POA: Insufficient documentation

## 2019-09-24 DIAGNOSIS — D509 Iron deficiency anemia, unspecified: Secondary | ICD-10-CM | POA: Diagnosis not present

## 2019-09-24 MED ORDER — SODIUM CHLORIDE 0.9 % IV SOLN
510.0000 mg | INTRAVENOUS | Status: AC
  Administered 2019-09-24: 510 mg via INTRAVENOUS
  Filled 2019-09-24: qty 510

## 2019-10-01 ENCOUNTER — Telehealth: Payer: Self-pay | Admitting: *Deleted

## 2019-10-01 ENCOUNTER — Other Ambulatory Visit: Payer: Self-pay | Admitting: Hematology and Oncology

## 2019-10-01 ENCOUNTER — Other Ambulatory Visit: Payer: Self-pay | Admitting: Obstetrics and Gynecology

## 2019-10-01 ENCOUNTER — Encounter (HOSPITAL_COMMUNITY): Payer: Self-pay

## 2019-10-01 ENCOUNTER — Ambulatory Visit (HOSPITAL_COMMUNITY): Payer: Medicaid Other | Admitting: *Deleted

## 2019-10-01 ENCOUNTER — Other Ambulatory Visit: Payer: Self-pay

## 2019-10-01 ENCOUNTER — Ambulatory Visit (HOSPITAL_COMMUNITY)
Admission: RE | Admit: 2019-10-01 | Discharge: 2019-10-01 | Disposition: A | Discharge status: Home / Self Care | Payer: Medicaid Other | Source: Ambulatory Visit | Attending: Obstetrics and Gynecology | Admitting: Obstetrics and Gynecology

## 2019-10-01 DIAGNOSIS — Z362 Encounter for other antenatal screening follow-up: Secondary | ICD-10-CM | POA: Diagnosis not present

## 2019-10-01 DIAGNOSIS — O2693 Pregnancy related conditions, unspecified, third trimester: Secondary | ICD-10-CM

## 2019-10-01 DIAGNOSIS — O09899 Supervision of other high risk pregnancies, unspecified trimester: Secondary | ICD-10-CM | POA: Insufficient documentation

## 2019-10-01 DIAGNOSIS — Z8759 Personal history of other complications of pregnancy, childbirth and the puerperium: Secondary | ICD-10-CM | POA: Diagnosis present

## 2019-10-01 DIAGNOSIS — Z3A34 34 weeks gestation of pregnancy: Secondary | ICD-10-CM | POA: Diagnosis not present

## 2019-10-01 DIAGNOSIS — O99213 Obesity complicating pregnancy, third trimester: Secondary | ICD-10-CM

## 2019-10-01 DIAGNOSIS — I2699 Other pulmonary embolism without acute cor pulmonale: Secondary | ICD-10-CM | POA: Insufficient documentation

## 2019-10-01 DIAGNOSIS — F419 Anxiety disorder, unspecified: Secondary | ICD-10-CM | POA: Diagnosis present

## 2019-10-01 DIAGNOSIS — F4310 Post-traumatic stress disorder, unspecified: Secondary | ICD-10-CM

## 2019-10-01 DIAGNOSIS — O0990 Supervision of high risk pregnancy, unspecified, unspecified trimester: Secondary | ICD-10-CM | POA: Insufficient documentation

## 2019-10-01 DIAGNOSIS — O88213 Thromboembolism in pregnancy, third trimester: Secondary | ICD-10-CM | POA: Insufficient documentation

## 2019-10-01 DIAGNOSIS — O09293 Supervision of pregnancy with other poor reproductive or obstetric history, third trimester: Secondary | ICD-10-CM | POA: Diagnosis not present

## 2019-10-01 MED ORDER — ENOXAPARIN SODIUM 100 MG/ML ~~LOC~~ SOLN: 100.0000 mg | Freq: Two times a day (BID) | SUBCUTANEOUS | 3 refills | Status: DC

## 2019-10-01 NOTE — Procedures (Signed)
Meghan Reed 08-03-1986 [redacted]w[redacted]d  Fetus A Non-Stress Test Interpretation for 10/01/19  Indication: Hx IUFD  Fetal Heart Rate A Mode: External Baseline Rate (A): 155 bpm Variability: Moderate Accelerations: 15 x 15 Decelerations: None Multiple birth?: No  Uterine Activity Mode: Palpation, Toco Contraction Frequency (min): UI Contraction Duration (sec): 10-30 Contraction Quality: Mild Resting Tone Palpated: Relaxed Resting Time: Adequate  Interpretation (Fetal Testing) Nonstress Test Interpretation: Reactive Comments: EFM tracing reviewed by Dr. Annamaria Boots

## 2019-10-01 NOTE — Telephone Encounter (Signed)
Received call from patient requesting refill of her Lovenox injections. She gets her prescriptions at [facility] on [address]

## 2019-10-09 ENCOUNTER — Other Ambulatory Visit: Payer: Self-pay

## 2019-10-09 ENCOUNTER — Ambulatory Visit (INDEPENDENT_AMBULATORY_CARE_PROVIDER_SITE_OTHER): Payer: Medicaid Other | Admitting: *Deleted

## 2019-10-09 ENCOUNTER — Ambulatory Visit (INDEPENDENT_AMBULATORY_CARE_PROVIDER_SITE_OTHER): Payer: Medicaid Other | Admitting: Obstetrics and Gynecology

## 2019-10-09 ENCOUNTER — Other Ambulatory Visit (HOSPITAL_COMMUNITY)
Admission: RE | Admit: 2019-10-09 | Discharge: 2019-10-09 | Disposition: A | Discharge status: Home / Self Care | Payer: Medicaid Other | Source: Ambulatory Visit | Attending: Obstetrics and Gynecology | Admitting: Obstetrics and Gynecology

## 2019-10-09 ENCOUNTER — Ambulatory Visit: Payer: Self-pay

## 2019-10-09 VITALS — BP 93/67 | HR 118 | Wt 220.0 lb

## 2019-10-09 DIAGNOSIS — Z3A36 36 weeks gestation of pregnancy: Secondary | ICD-10-CM

## 2019-10-09 DIAGNOSIS — Z3009 Encounter for other general counseling and advice on contraception: Secondary | ICD-10-CM

## 2019-10-09 DIAGNOSIS — O0990 Supervision of high risk pregnancy, unspecified, unspecified trimester: Secondary | ICD-10-CM | POA: Insufficient documentation

## 2019-10-09 DIAGNOSIS — Z8759 Personal history of other complications of pregnancy, childbirth and the puerperium: Secondary | ICD-10-CM

## 2019-10-09 DIAGNOSIS — I2699 Other pulmonary embolism without acute cor pulmonale: Secondary | ICD-10-CM

## 2019-10-09 DIAGNOSIS — D508 Other iron deficiency anemias: Secondary | ICD-10-CM

## 2019-10-09 DIAGNOSIS — O09293 Supervision of pregnancy with other poor reproductive or obstetric history, third trimester: Secondary | ICD-10-CM | POA: Diagnosis present

## 2019-10-09 DIAGNOSIS — O320XX0 Maternal care for unstable lie, not applicable or unspecified: Secondary | ICD-10-CM

## 2019-10-09 DIAGNOSIS — A6000 Herpesviral infection of urogenital system, unspecified: Secondary | ICD-10-CM

## 2019-10-09 DIAGNOSIS — I2693 Single subsegmental pulmonary embolism without acute cor pulmonale: Secondary | ICD-10-CM

## 2019-10-09 MED ORDER — FLUCONAZOLE 150 MG PO TABS: 150.0000 mg | ORAL_TABLET | Freq: Once | ORAL | 0 refills | Status: AC
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
## 2019-10-10 ENCOUNTER — Telehealth: Payer: Self-pay | Admitting: *Deleted

## 2019-10-10 LAB — CERVICOVAGINAL ANCILLARY ONLY
Bacterial Vaginitis (gardnerella): NEGATIVE
Candida Glabrata: NEGATIVE
Candida Vaginitis: NEGATIVE
Chlamydia: NEGATIVE
Comment: NEGATIVE
Comment: NEGATIVE
Comment: NEGATIVE
Comment: NEGATIVE
Comment: NEGATIVE
Comment: NORMAL
Neisseria Gonorrhea: NEGATIVE
Trichomonas: NEGATIVE

## 2019-10-10 NOTE — Telephone Encounter (Signed)
Received call from [facility] Case Manager [place] Ma [phone].  She is following up on behalf of the patient as the last time she spoke with the patient she states she doesn't have enough Lovenox to cover her until after she has her planned c-section on 10/31/2019 and does not have a follow up.    Reviewed medication dosing and the 3 refills with Case manager and did see that per the LOS Dr. Lorenso Courier wanted her to have a follow up around 11/04/2019 assuming she is discharged from the hospital.  Scheduling message routed since this appt was not scheduled yet.    All questions and answers resolved at this point.

## 2019-10-11 LAB — STREP GP B NAA: Strep Gp B NAA: NEGATIVE

## 2019-10-11 NOTE — Addendum Note (Signed)
Addended by: Aletha Halim on: 10/11/2019 08:45 AM   Modules accepted: Orders, SmartSet

## 2019-10-15 ENCOUNTER — Encounter: Payer: Self-pay | Admitting: General Practice

## 2019-10-16 ENCOUNTER — Ambulatory Visit: Payer: Self-pay

## 2019-10-16 ENCOUNTER — Other Ambulatory Visit: Payer: Self-pay

## 2019-10-16 ENCOUNTER — Ambulatory Visit (INDEPENDENT_AMBULATORY_CARE_PROVIDER_SITE_OTHER): Payer: Medicaid Other | Admitting: Obstetrics and Gynecology

## 2019-10-16 ENCOUNTER — Other Ambulatory Visit: Payer: Self-pay | Admitting: Advanced Practice Midwife

## 2019-10-16 ENCOUNTER — Encounter: Payer: Self-pay | Admitting: Obstetrics and Gynecology

## 2019-10-16 ENCOUNTER — Ambulatory Visit (INDEPENDENT_AMBULATORY_CARE_PROVIDER_SITE_OTHER): Payer: Medicaid Other | Admitting: *Deleted

## 2019-10-16 VITALS — BP 108/73 | HR 116 | Wt 217.6 lb

## 2019-10-16 DIAGNOSIS — Z3A37 37 weeks gestation of pregnancy: Secondary | ICD-10-CM | POA: Diagnosis not present

## 2019-10-16 DIAGNOSIS — O98813 Other maternal infectious and parasitic diseases complicating pregnancy, third trimester: Secondary | ICD-10-CM

## 2019-10-16 DIAGNOSIS — Z8759 Personal history of other complications of pregnancy, childbirth and the puerperium: Secondary | ICD-10-CM

## 2019-10-16 DIAGNOSIS — Z3009 Encounter for other general counseling and advice on contraception: Secondary | ICD-10-CM

## 2019-10-16 DIAGNOSIS — I2693 Single subsegmental pulmonary embolism without acute cor pulmonale: Secondary | ICD-10-CM

## 2019-10-16 DIAGNOSIS — O09293 Supervision of pregnancy with other poor reproductive or obstetric history, third trimester: Secondary | ICD-10-CM

## 2019-10-16 DIAGNOSIS — O0990 Supervision of high risk pregnancy, unspecified, unspecified trimester: Secondary | ICD-10-CM

## 2019-10-16 DIAGNOSIS — O99413 Diseases of the circulatory system complicating pregnancy, third trimester: Secondary | ICD-10-CM

## 2019-10-16 DIAGNOSIS — O0993 Supervision of high risk pregnancy, unspecified, third trimester: Secondary | ICD-10-CM

## 2019-10-16 DIAGNOSIS — A6000 Herpesviral infection of urogenital system, unspecified: Secondary | ICD-10-CM

## 2019-10-16 LAB — POCT URINALYSIS DIP (DEVICE)
Bilirubin Urine: NEGATIVE
Glucose, UA: NEGATIVE mg/dL
Ketones, ur: NEGATIVE mg/dL
Leukocytes,Ua: NEGATIVE
Nitrite: NEGATIVE
Protein, ur: 30 mg/dL — AB
Specific Gravity, Urine: 1.025 (ref 1.005–1.030)
Urobilinogen, UA: 0.2 mg/dL (ref 0.0–1.0)
pH: 7 (ref 5.0–8.0)
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
## 2019-10-17 ENCOUNTER — Other Ambulatory Visit (HOSPITAL_COMMUNITY): Admission: RE | Admit: 2019-10-17 | Payer: Medicaid Other | Source: Ambulatory Visit

## 2019-10-17 ENCOUNTER — Telehealth: Payer: Self-pay

## 2019-10-17 NOTE — Telephone Encounter (Signed)
Called pt due to her concern of IOL on My Chart for 10/18/2019, pt states was told it was on 10/31/2019. Assured pt her IOL is scheduled for 10/31/2019. Pt verbalized understanding.

## 2019-10-18 ENCOUNTER — Encounter (HOSPITAL_COMMUNITY): Payer: Medicaid Other

## 2019-10-21 ENCOUNTER — Telehealth (HOSPITAL_COMMUNITY): Payer: Self-pay | Admitting: *Deleted

## 2019-10-21 NOTE — Telephone Encounter (Signed)
Preadmission screen  

## 2019-10-23 ENCOUNTER — Ambulatory Visit (INDEPENDENT_AMBULATORY_CARE_PROVIDER_SITE_OTHER): Payer: Medicaid Other | Admitting: Obstetrics and Gynecology

## 2019-10-23 ENCOUNTER — Other Ambulatory Visit: Payer: Self-pay

## 2019-10-23 ENCOUNTER — Ambulatory Visit: Payer: Self-pay

## 2019-10-23 ENCOUNTER — Ambulatory Visit (INDEPENDENT_AMBULATORY_CARE_PROVIDER_SITE_OTHER): Payer: Medicaid Other | Admitting: *Deleted

## 2019-10-23 VITALS — BP 96/71 | HR 119 | Wt 216.1 lb

## 2019-10-23 DIAGNOSIS — I2693 Single subsegmental pulmonary embolism without acute cor pulmonale: Secondary | ICD-10-CM

## 2019-10-23 DIAGNOSIS — Z3A38 38 weeks gestation of pregnancy: Secondary | ICD-10-CM | POA: Diagnosis not present

## 2019-10-23 DIAGNOSIS — Z8759 Personal history of other complications of pregnancy, childbirth and the puerperium: Secondary | ICD-10-CM

## 2019-10-23 DIAGNOSIS — O09293 Supervision of pregnancy with other poor reproductive or obstetric history, third trimester: Secondary | ICD-10-CM

## 2019-10-23 DIAGNOSIS — A6000 Herpesviral infection of urogenital system, unspecified: Secondary | ICD-10-CM

## 2019-10-23 DIAGNOSIS — D508 Other iron deficiency anemias: Secondary | ICD-10-CM

## 2019-10-23 DIAGNOSIS — O320XX0 Maternal care for unstable lie, not applicable or unspecified: Secondary | ICD-10-CM

## 2019-10-23 DIAGNOSIS — O0993 Supervision of high risk pregnancy, unspecified, third trimester: Secondary | ICD-10-CM

## 2019-10-23 DIAGNOSIS — Z3009 Encounter for other general counseling and advice on contraception: Secondary | ICD-10-CM

## 2019-10-23 DIAGNOSIS — O0990 Supervision of high risk pregnancy, unspecified, unspecified trimester: Secondary | ICD-10-CM
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
## 2019-10-26 ENCOUNTER — Other Ambulatory Visit: Payer: Self-pay

## 2019-10-29 ENCOUNTER — Other Ambulatory Visit: Payer: Self-pay

## 2019-10-29 ENCOUNTER — Other Ambulatory Visit (HOSPITAL_COMMUNITY)
Admission: RE | Admit: 2019-10-29 | Discharge: 2019-10-29 | Disposition: A | Discharge status: Home / Self Care | Payer: Medicaid Other | Source: Ambulatory Visit | Attending: Obstetrics and Gynecology | Admitting: Obstetrics and Gynecology

## 2019-10-29 ENCOUNTER — Ambulatory Visit (INDEPENDENT_AMBULATORY_CARE_PROVIDER_SITE_OTHER): Payer: Medicaid Other | Admitting: *Deleted

## 2019-10-29 VITALS — BP 96/60 | HR 101

## 2019-10-29 DIAGNOSIS — Z20828 Contact with and (suspected) exposure to other viral communicable diseases: Secondary | ICD-10-CM | POA: Insufficient documentation

## 2019-10-29 DIAGNOSIS — Z8759 Personal history of other complications of pregnancy, childbirth and the puerperium: Secondary | ICD-10-CM | POA: Diagnosis present

## 2019-10-29 DIAGNOSIS — Z3A38 38 weeks gestation of pregnancy: Secondary | ICD-10-CM | POA: Diagnosis not present

## 2019-10-29 DIAGNOSIS — Z01812 Encounter for preprocedural laboratory examination: Secondary | ICD-10-CM | POA: Insufficient documentation

## 2019-10-29 LAB — SARS CORONAVIRUS 2 (TAT 6-24 HRS): SARS Coronavirus 2: NEGATIVE

## 2019-10-29 NOTE — Progress Notes (Signed)
IOL scheduled on 12/31 @ 1200. Vertex presentation confirmed with bedside US.

## 2019-10-30 ENCOUNTER — Inpatient Hospital Stay (HOSPITAL_COMMUNITY)
Admission: AD | Admit: 2019-10-30 | Payer: Medicaid Other | Source: Home / Self Care | Admitting: Obstetrics and Gynecology

## 2019-10-30 ENCOUNTER — Telehealth: Payer: Self-pay | Admitting: Hematology and Oncology

## 2019-10-30 SURGERY — Surgical Case
Anesthesia: Regional

## 2019-10-30 NOTE — Telephone Encounter (Signed)
Scheduled per 11/6 los. Called and spoke with pt, confirmed 1/7 appt

## 2019-10-31 ENCOUNTER — Other Ambulatory Visit: Payer: Self-pay

## 2019-10-31 ENCOUNTER — Inpatient Hospital Stay (HOSPITAL_COMMUNITY): Payer: Medicaid Other | Admitting: Anesthesiology

## 2019-10-31 ENCOUNTER — Encounter (HOSPITAL_COMMUNITY): Payer: Self-pay | Admitting: Obstetrics and Gynecology

## 2019-10-31 ENCOUNTER — Inpatient Hospital Stay (HOSPITAL_COMMUNITY): Payer: Medicaid Other

## 2019-10-31 ENCOUNTER — Inpatient Hospital Stay (HOSPITAL_COMMUNITY)
Admission: AD | Admit: 2019-10-31 | Discharge: 2019-11-02 | DRG: 768 | Disposition: A | Discharge status: Home / Self Care | Payer: Medicaid Other | Attending: Obstetrics and Gynecology | Admitting: Obstetrics and Gynecology

## 2019-10-31 DIAGNOSIS — J45909 Unspecified asthma, uncomplicated: Secondary | ICD-10-CM | POA: Diagnosis present

## 2019-10-31 DIAGNOSIS — Z86711 Personal history of pulmonary embolism: Secondary | ICD-10-CM | POA: Diagnosis not present

## 2019-10-31 DIAGNOSIS — E669 Obesity, unspecified: Secondary | ICD-10-CM | POA: Diagnosis present

## 2019-10-31 DIAGNOSIS — I2699 Other pulmonary embolism without acute cor pulmonale: Secondary | ICD-10-CM | POA: Diagnosis present

## 2019-10-31 DIAGNOSIS — D509 Iron deficiency anemia, unspecified: Secondary | ICD-10-CM | POA: Diagnosis present

## 2019-10-31 DIAGNOSIS — Z87891 Personal history of nicotine dependence: Secondary | ICD-10-CM

## 2019-10-31 DIAGNOSIS — O09899 Supervision of other high risk pregnancies, unspecified trimester: Secondary | ICD-10-CM

## 2019-10-31 DIAGNOSIS — O9832 Other infections with a predominantly sexual mode of transmission complicating childbirth: Secondary | ICD-10-CM | POA: Diagnosis present

## 2019-10-31 DIAGNOSIS — Z3A39 39 weeks gestation of pregnancy: Secondary | ICD-10-CM

## 2019-10-31 DIAGNOSIS — O9952 Diseases of the respiratory system complicating childbirth: Secondary | ICD-10-CM | POA: Diagnosis present

## 2019-10-31 DIAGNOSIS — A6000 Herpesviral infection of urogenital system, unspecified: Secondary | ICD-10-CM | POA: Diagnosis present

## 2019-10-31 DIAGNOSIS — I2693 Single subsegmental pulmonary embolism without acute cor pulmonale: Secondary | ICD-10-CM

## 2019-10-31 DIAGNOSIS — Z3009 Encounter for other general counseling and advice on contraception: Secondary | ICD-10-CM | POA: Diagnosis present

## 2019-10-31 DIAGNOSIS — O99214 Obesity complicating childbirth: Secondary | ICD-10-CM | POA: Diagnosis present

## 2019-10-31 DIAGNOSIS — O9902 Anemia complicating childbirth: Secondary | ICD-10-CM | POA: Diagnosis present

## 2019-10-31 DIAGNOSIS — O26893 Other specified pregnancy related conditions, third trimester: Secondary | ICD-10-CM | POA: Diagnosis present

## 2019-10-31 DIAGNOSIS — O320XX0 Maternal care for unstable lie, not applicable or unspecified: Secondary | ICD-10-CM | POA: Diagnosis present

## 2019-10-31 LAB — CBC
HCT: 32.9 % — ABNORMAL LOW (ref 36.0–46.0)
HCT: 36.7 % (ref 36.0–46.0)
Hemoglobin: 11.3 g/dL — ABNORMAL LOW (ref 12.0–15.0)
Hemoglobin: 12.4 g/dL (ref 12.0–15.0)
MCH: 30.2 pg (ref 26.0–34.0)
MCH: 30.9 pg (ref 26.0–34.0)
MCHC: 33.8 g/dL (ref 30.0–36.0)
MCHC: 34.3 g/dL (ref 30.0–36.0)
MCV: 89.3 fL (ref 80.0–100.0)
MCV: 89.9 fL (ref 80.0–100.0)
Platelets: 115 10*3/uL — ABNORMAL LOW (ref 150–400)
Platelets: 125 10*3/uL — ABNORMAL LOW (ref 150–400)
RBC: 3.66 MIL/uL — ABNORMAL LOW (ref 3.87–5.11)
RBC: 4.11 MIL/uL (ref 3.87–5.11)
RDW: 17.6 % — ABNORMAL HIGH (ref 11.5–15.5)
RDW: 17.6 % — ABNORMAL HIGH (ref 11.5–15.5)
WBC: 11.9 10*3/uL — ABNORMAL HIGH (ref 4.0–10.5)
WBC: 8 10*3/uL (ref 4.0–10.5)
nRBC: 0 % (ref 0.0–0.2)
nRBC: 0.3 % — ABNORMAL HIGH (ref 0.0–0.2)

## 2019-10-31 LAB — PROTIME-INR
INR: 1 (ref 0.8–1.2)
Prothrombin Time: 13.5 seconds (ref 11.4–15.2)

## 2019-10-31 LAB — COMPREHENSIVE METABOLIC PANEL
ALT: 15 U/L (ref 0–44)
AST: 17 U/L (ref 15–41)
Albumin: 3 g/dL — ABNORMAL LOW (ref 3.5–5.0)
Alkaline Phosphatase: 88 U/L (ref 38–126)
Anion gap: 8 (ref 5–15)
BUN: 7 mg/dL (ref 6–20)
CO2: 22 mmol/L (ref 22–32)
Calcium: 8.8 mg/dL — ABNORMAL LOW (ref 8.9–10.3)
Chloride: 106 mmol/L (ref 98–111)
Creatinine, Ser: 0.5 mg/dL (ref 0.44–1.00)
GFR calc Af Amer: >60 mL/min (ref >60)
GFR calc non Af Amer: >60 mL/min (ref >60)
Glucose, Bld: 85 mg/dL (ref 70–99)
Potassium: 3.6 mmol/L (ref 3.5–5.1)
Sodium: 136 mmol/L (ref 135–145)
Total Bilirubin: 0.5 mg/dL (ref 0.3–1.2)
Total Protein: 6.1 g/dL — ABNORMAL LOW (ref 6.5–8.1)

## 2019-10-31 LAB — DIC (DISSEMINATED INTRAVASCULAR COAGULATION)PANEL
D-Dimer, Quant: >20 ug/mL-FEU — ABNORMAL HIGH (ref 0.00–0.50)
Fibrinogen: 168 mg/dL — ABNORMAL LOW (ref 210–475)
INR: 1.5 — ABNORMAL HIGH (ref 0.8–1.2)
Platelets: 113 10*3/uL — ABNORMAL LOW (ref 150–400)
Prothrombin Time: 17.9 seconds — ABNORMAL HIGH (ref 11.4–15.2)
Smear Review: NONE SEEN
aPTT: 32 seconds (ref 24–36)

## 2019-10-31 LAB — FIBRINOGEN: Fibrinogen: 498 mg/dL — ABNORMAL HIGH (ref 210–475)

## 2019-10-31 LAB — RPR: RPR Ser Ql: NONREACTIVE

## 2019-10-31 LAB — APTT: aPTT: 24 seconds (ref 24–36)

## 2019-10-31 LAB — PREPARE RBC (CROSSMATCH)

## 2019-10-31 MED ORDER — EPHEDRINE 5 MG/ML INJ: 10.0000 mg | INTRAVENOUS | Status: DC | PRN

## 2019-10-31 MED ORDER — TRANEXAMIC ACID-NACL 1000-0.7 MG/100ML-% IV SOLN
INTRAVENOUS | Status: AC
  Administered 2019-10-31: 1000 mg via INTRAVENOUS
  Filled 2019-10-31: qty 100

## 2019-10-31 MED ORDER — SOD CITRATE-CITRIC ACID 500-334 MG/5ML PO SOLN: 30.0000 mL | ORAL | Status: DC | PRN

## 2019-10-31 MED ORDER — SODIUM CHLORIDE (PF) 0.9 % IJ SOLN
INTRAMUSCULAR | Status: DC | PRN
  Administered 2019-10-31: 12 mL/h via EPIDURAL

## 2019-10-31 MED ORDER — TRANEXAMIC ACID-NACL 1000-0.7 MG/100ML-% IV SOLN: 1000.0000 mg | INTRAVENOUS | Status: AC

## 2019-10-31 MED ORDER — ONDANSETRON HCL 4 MG/2ML IJ SOLN
4.0000 mg | Freq: Four times a day (QID) | INTRAMUSCULAR | Status: DC | PRN
  Administered 2019-10-31: 4 mg via INTRAVENOUS
  Filled 2019-10-31: qty 2

## 2019-10-31 MED ORDER — FENTANYL CITRATE (PF) 100 MCG/2ML IJ SOLN
50.0000 ug | INTRAMUSCULAR | Status: DC | PRN
  Administered 2019-10-31 (×2): 50 ug via INTRAVENOUS
  Filled 2019-10-31 (×2): qty 2

## 2019-10-31 MED ORDER — LACTATED RINGERS IV SOLN
500.0000 mL | Freq: Once | INTRAVENOUS | Status: AC
  Administered 2019-10-31: 500 mL via INTRAVENOUS

## 2019-10-31 MED ORDER — LIDOCAINE HCL (PF) 1 % IJ SOLN: 30.0000 mL | INTRAMUSCULAR | Status: DC | PRN

## 2019-10-31 MED ORDER — MISOPROSTOL 200 MCG PO TABS
800.0000 ug | ORAL_TABLET | Freq: Once | ORAL | Status: AC
  Administered 2019-10-31: 800 ug via RECTAL

## 2019-10-31 MED ORDER — PHENYLEPHRINE 40 MCG/ML (10ML) SYRINGE FOR IV PUSH (FOR BLOOD PRESSURE SUPPORT)
80.0000 ug | PREFILLED_SYRINGE | INTRAVENOUS | Status: DC | PRN
  Filled 2019-10-31 (×2): qty 10

## 2019-10-31 MED ORDER — SODIUM CHLORIDE 0.9% IV SOLUTION: Freq: Once | INTRAVENOUS | Status: AC

## 2019-10-31 MED ORDER — LACTATED RINGERS IV SOLN: INTRAVENOUS | Status: DC

## 2019-10-31 MED ORDER — OXYTOCIN 40 UNITS IN NORMAL SALINE INFUSION - SIMPLE MED
1.0000 m[IU]/min | INTRAVENOUS | Status: DC
  Administered 2019-10-31: 2 m[IU]/min via INTRAVENOUS
  Filled 2019-10-31: qty 1000

## 2019-10-31 MED ORDER — TERBUTALINE SULFATE 1 MG/ML IJ SOLN: 0.2500 mg | Freq: Once | INTRAMUSCULAR | Status: DC | PRN

## 2019-10-31 MED ORDER — MISOPROSTOL 50MCG HALF TABLET: 50.0000 ug | ORAL_TABLET | Freq: Once | ORAL | Status: AC

## 2019-10-31 MED ORDER — OXYTOCIN BOLUS FROM INFUSION
500.0000 mL | Freq: Once | INTRAVENOUS | Status: AC
  Administered 2019-10-31: 1000 mL via INTRAVENOUS

## 2019-10-31 MED ORDER — MISOPROSTOL 50MCG HALF TABLET
ORAL_TABLET | ORAL | Status: AC
  Administered 2019-10-31: 50 ug via ORAL
  Filled 2019-10-31: qty 1

## 2019-10-31 MED ORDER — ACETAMINOPHEN 325 MG PO TABS: 650.0000 mg | ORAL_TABLET | ORAL | Status: DC | PRN

## 2019-10-31 MED ORDER — TRANEXAMIC ACID-NACL 1000-0.7 MG/100ML-% IV SOLN
INTRAVENOUS | Status: AC
  Filled 2019-10-31: qty 100

## 2019-10-31 MED ORDER — PROMETHAZINE HCL 25 MG/ML IJ SOLN
25.0000 mg | Freq: Once | INTRAMUSCULAR | Status: AC
  Administered 2019-10-31: 25 mg via INTRAVENOUS
  Filled 2019-10-31: qty 1

## 2019-10-31 MED ORDER — LACTATED RINGERS IV SOLN
500.0000 mL | INTRAVENOUS | Status: DC | PRN
  Administered 2019-10-31 (×2): 1000 mL via INTRAVENOUS

## 2019-10-31 MED ORDER — LIDOCAINE HCL (PF) 1 % IJ SOLN
INTRAMUSCULAR | Status: DC | PRN
  Administered 2019-10-31: 10 mL via EPIDURAL
  Administered 2019-10-31: 2 mL via EPIDURAL

## 2019-10-31 MED ORDER — MISOPROSTOL 200 MCG PO TABS
ORAL_TABLET | ORAL | Status: AC
  Filled 2019-10-31: qty 4

## 2019-10-31 MED ORDER — SODIUM CHLORIDE 0.9% IV SOLUTION: Freq: Once | INTRAVENOUS | Status: DC

## 2019-10-31 MED ORDER — PHENYLEPHRINE 40 MCG/ML (10ML) SYRINGE FOR IV PUSH (FOR BLOOD PRESSURE SUPPORT)
80.0000 ug | PREFILLED_SYRINGE | INTRAVENOUS | Status: DC | PRN
  Administered 2019-10-31: 80 ug via INTRAVENOUS

## 2019-10-31 MED ORDER — DIPHENHYDRAMINE HCL 50 MG/ML IJ SOLN: 12.5000 mg | INTRAMUSCULAR | Status: DC | PRN

## 2019-10-31 MED ORDER — FENTANYL-BUPIVACAINE-NACL 0.5-0.125-0.9 MG/250ML-% EP SOLN
12.0000 mL/h | EPIDURAL | Status: DC | PRN
  Filled 2019-10-31: qty 250

## 2019-10-31 MED ORDER — MISOPROSTOL 25 MCG QUARTER TABLET: 25.0000 ug | ORAL_TABLET | ORAL | Status: DC | PRN

## 2019-10-31 MED ORDER — TRANEXAMIC ACID-NACL 1000-0.7 MG/100ML-% IV SOLN
1000.0000 mg | Freq: Once | INTRAVENOUS | Status: AC | PRN
  Administered 2019-10-31: 1000 mg via INTRAVENOUS

## 2019-10-31 MED ORDER — METHYLERGONOVINE MALEATE 0.2 MG/ML IJ SOLN: 0.2000 mg | INTRAMUSCULAR | Status: DC

## 2019-10-31 MED ORDER — METHYLERGONOVINE MALEATE 0.2 MG/ML IJ SOLN
INTRAMUSCULAR | Status: AC
  Administered 2019-10-31: 0.2 mg via INTRAMUSCULAR
  Filled 2019-10-31: qty 1

## 2019-10-31 MED ORDER — OXYTOCIN 40 UNITS IN NORMAL SALINE INFUSION - SIMPLE MED
2.5000 [IU]/h | INTRAVENOUS | Status: DC
  Filled 2019-10-31: qty 1000

## 2019-10-31 NOTE — Progress Notes (Signed)
Bear Hugger applied for patient comfort @ 2218 on 10/31/2019. Removed @ 2250 on 10/31/2019.

## 2019-10-31 NOTE — Progress Notes (Signed)
Pt. Given blood products emergently by Anesthesia MD and CRNA due to pt. Vitals and LOC. Pt. Given 2x pRBC and 1x platelets IV bolus. Blood products verified by Anesthesia MD Dr. Elgie Congo and RN Darliss Ridgel will chart under 2x RN for duel sign off in Epic.   1x lap and bakri balloon (500 cc sterile water) inside. Unable to scan due to lap.   After emergent blood products pt. Vitals stable and LOC improved. Pt. Somnolent due to phenergan. Pt. Comfortable. Urinary catheter placed.   OB Dr. Beverlee Nims updated on patient. Pt. Still to receive 1x pRBC, cryo, and ffp.
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
## 2019-10-31 NOTE — Discharge Summary (Signed)
Postpartum Discharge Summary  Date of Service updated 3:03 PM 11/02/2019     Patient Name: Meghan Reed DOB: 03/05/1986 MRN: [id number]  Date of admission: 10/31/2019 Delivering Provider: Chauncey Mann   Date of discharge: 11/02/2019  Admitting diagnosis: History of intrauterine fetal death [Z87.59] Intrauterine pregnancy: [redacted]w[redacted]d    Secondary diagnosis:  Active Problems:   History of preterm delivery, currently pregnant   Genital HSV   Pulmonary embolism ([place]   Iron deficiency anemia   Unwanted fertility   Unstable lie of fetus   PPH (postpartum hemorrhage)  Additional problems: h/o PE     Discharge diagnosis: Term Pregnancy Delivered, Anemia and PPH                                                                                                Post partum procedures:none  Augmentation: Pitocin, Cytotec and Foley Balloon  Complications: [id number] Hospital course:  Induction of Labor With Vaginal Delivery   33y.o. yo [id number] 345w1das admitted to the hospital 10/31/2019 for induction of labor.  Indication for induction: hx of IUFD.  Patient had an uncomplicated labor course as follows: Initial SVE: 1/thick/-3. Patient received Foley bulb, Cytotec and Pitocin. Received epidural. SROM occurred. She then progressed to complete with uncomplicated delivery. PPH then occurred with ultimate placement of Bakri balloon. Patient given 4 units PRBC's, cryoprecipitate, FFP and platelets.  Membrane Rupture Time/Date: 8:08 PM ,10/31/2019   Intrapartum Procedures: Episiotomy: None [1]                                         Lacerations:  None [1]  Patient had delivery of a Viable infant.  Information for the patient's newborn:  [name][[id number]]Delivery Method: Vag-Spont    10/31/2019  Details of delivery can be found in separate delivery note. Hgb after PPH 9.1. Bakri removed on 11/01/19. Patient restarted on Lovenox prior to DC. Patient is  discharged home 11/02/19. Delivery time: 8:32 PM    Magnesium Sulfate received: No BMZ received: No Rhophylac:No MMR:No Transfusion:Yes  Physical exam  Vitals:   11/01/19 2315 11/02/19 0315 11/02/19 0739 11/02/19 1145  BP: (!) '89/56 96/63 92/60 '$ (!) 95/56  Pulse: 96 85 82 95  Resp: '18 18  16  '$ Temp: 98.3 F (36.8 C) 98.5 F (36.9 C) 97.8 F (36.6 C) 98.2 F (36.8 C)  TempSrc: Oral Oral Oral Oral  SpO2: 99% 99% 99% 96%  Weight:      Height:       General: alert, cooperative and no distress Lochia: appropriate Uterine Fundus: firm Incision: N/A DVT Evaluation: No evidence of DVT seen on physical exam. Labs: Lab Results  Component Value Date   WBC 10.8 (H) 11/02/2019   HGB 9.1 (L) 11/02/2019   HCT 27.0 (L) 11/02/2019   MCV 89.4 11/02/2019   PLT 90 (L) 11/02/2019   CMP Latest Ref Rng & Units 10/31/2019  Glucose 70 - 99 mg/dL 85  BUN 6 -  20 mg/dL 7  Creatinine 0.44 - 1.00 mg/dL 0.50  Sodium 135 - 145 mmol/L 136  Potassium 3.5 - 5.1 mmol/L 3.6  Chloride 98 - 111 mmol/L 106  CO2 22 - 32 mmol/L 22  Calcium 8.9 - 10.3 mg/dL 8.8(L)  Total Protein 6.5 - 8.1 g/dL 6.1(L)  Total Bilirubin 0.3 - 1.2 mg/dL 0.5  Alkaline Phos 38 - 126 U/L 88  AST 15 - 41 U/L 17  ALT 0 - 44 U/L 15    Discharge instruction: per After Visit Summary and "Baby and Me Booklet".  After visit meds:  Allergies as of 11/02/2019      Reactions   Other Swelling   tomato      Medication List    STOP taking these medications   acetaminophen 500 MG tablet Commonly known as: TYLENOL   aspirin EC 81 MG tablet   Comfort Fit Maternity Supp Lg Misc   cyclobenzaprine 10 MG tablet Commonly known as: FLEXERIL   famotidine 20 MG tablet Commonly known as: PEPCID   omeprazole 40 MG capsule Commonly known as: PRILOSEC   pantoprazole 40 MG tablet Commonly known as: PROTONIX   Prenatal Gummies/DHA & FA 0.4-32.5 MG Chew     TAKE these medications   Blood Pressure Kit Devi 1 Device by Does not  apply route as needed. ICD 10 :  O09.90   enoxaparin 100 MG/ML injection Commonly known as: Lovenox Inject 1 mL (100 mg total) into the skin every 12 (twelve) hours.   ferrous sulfate 325 (65 FE) MG tablet Take 1 tablet (325 mg total) by mouth daily with breakfast.   ibuprofen 600 MG tablet Commonly known as: ADVIL Take 1 tablet (600 mg total) by mouth every 8 (eight) hours as needed for mild pain.       Diet: No evidence of DVT seen on physical exam.  Activity: Advance as tolerated. Pelvic rest for 6 weeks.   Outpatient follow up:4 weeks Follow up Appt: Future Appointments  Date Time Provider [place]  11/07/2019 12:30 PM Orson Slick, MD [facility] None   Follow up Visit: [facility] Follow up in 4 week(s).   Specialty: Obstetrics and Gynecology Contact information: [address], [place] [id number] [place] [address] [phone]       Orson Slick, MD Follow up in 1 week(s).   Specialty: Hematology and Oncology Why: continue Lovenox Contact information: [address] [place] [address] [phone]            Please schedule this patient for Postpartum visit in: 4 weeks with the following provider: Any provider High risk pregnancy complicated by: hx IUFD, PE Delivery mode:  SVD Anticipated Birth Control:  BTL done PP PP Procedures needed: none  Schedule Integrated BH visit: no      Newborn Data: Live born female  Birth Weight: 3340g APGAR: 82, 9  Newborn Delivery   Birth date/time: 10/31/2019 20:32:00 Delivery type: Vaginal, Spontaneous      Baby Feeding: Bottle and Breast Disposition:home with mother   11/02/2019 [name], MD

## 2019-10-31 NOTE — Progress Notes (Deleted)
Pt. Given blood products emergently by Anesthesia MD and CRNA due to pt. Vital signs and LOC. Pt. Given 2x pRBC and 1x platelets IV bolus. Blood products verified by Anesthesia MD Dr. Elgie Congo and RN Junie Panning MacDonald-will chart under 2x RN due to duel sign off in Epic.   1x lap and bakri balloon (500cc sterile water) inside. Unable to scan due to lap.   After emergent blood products pt. Vitals stable and LOC improved. Pt. Somnolent due to phenergan. Pt. Comfortable. Urinary catheter placed.    OB Dr. Roselie Awkward updated on patient. Pt. Still to receive 1x pRBC, Cryo, and FFP.
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
## 2019-10-31 NOTE — Anesthesia Procedure Notes (Signed)
Epidural Patient location during procedure: OB Start time: 10/31/2019 12:27 PM End time: 10/31/2019 12:37 PM  Staffing Anesthesiologist: Pervis Hocking, DO Performed: anesthesiologist   Preanesthetic Checklist Completed: patient identified, IV checked, risks and benefits discussed, monitors and equipment checked, pre-op evaluation and timeout performed  Epidural Patient position: sitting Prep: DuraPrep and site prepped and draped Patient monitoring: continuous pulse ox, blood pressure, heart rate and cardiac monitor Approach: midline Location: L3-L4 Injection technique: LOR air  Needle:  Needle type: Tuohy  Needle gauge: 17 G Needle length: 9 cm Needle insertion depth: 6.5 cm Catheter type: closed end flexible Catheter size: 19 Gauge Catheter at skin depth: 12 cm Test dose: negative  Assessment Sensory level: T8 Events: blood not aspirated, injection not painful, no injection resistance, no paresthesia and negative IV test  Additional Notes Patient identified. Risks/Benefits/Options discussed with patient including but not limited to bleeding, infection, nerve damage, paralysis, failed block, incomplete pain control, headache, blood pressure changes, nausea, vomiting, reactions to medication both or allergic, itching and postpartum back pain. Confirmed with bedside nurse the patient's most recent platelet count. Confirmed with patient that they are not currently taking any anticoagulation, have any bleeding history or any family history of bleeding disorders. Patient expressed understanding and wished to proceed. All questions were answered. Sterile technique was used throughout the entire procedure. Please see nursing notes for vital signs. Test dose was given through epidural catheter and negative prior to continuing to dose epidural or start infusion. Warning signs of high block given to the patient including shortness of breath, tingling/numbness in hands, complete motor  block, or any concerning symptoms with instructions to call for help. Patient was given instructions on fall risk and not to get out of bed. All questions and concerns addressed with instructions to call with any issues or inadequate analgesia.  Reason for block:procedure for pain
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
## 2019-11-01 ENCOUNTER — Encounter (HOSPITAL_COMMUNITY): Payer: Self-pay | Admitting: Obstetrics and Gynecology

## 2019-11-01 LAB — CBC
HCT: 34.5 % — ABNORMAL LOW (ref 36.0–46.0)
HCT: 30.1 % — ABNORMAL LOW (ref 36.0–46.0)
Hemoglobin: 12 g/dL (ref 12.0–15.0)
Hemoglobin: 10.4 g/dL — ABNORMAL LOW (ref 12.0–15.0)
MCH: 30.2 pg (ref 26.0–34.0)
MCH: 30.1 pg (ref 26.0–34.0)
MCHC: 34.8 g/dL (ref 30.0–36.0)
MCHC: 34.6 g/dL (ref 30.0–36.0)
MCV: 86.7 fL (ref 80.0–100.0)
MCV: 87.2 fL (ref 80.0–100.0)
Platelets: 67 10*3/uL — ABNORMAL LOW (ref 150–400)
Platelets: 107 10*3/uL — ABNORMAL LOW (ref 150–400)
RBC: 3.98 MIL/uL (ref 3.87–5.11)
RBC: 3.45 MIL/uL — ABNORMAL LOW (ref 3.87–5.11)
RDW: 16 % — ABNORMAL HIGH (ref 11.5–15.5)
RDW: 16.8 % — ABNORMAL HIGH (ref 11.5–15.5)
WBC: 15 10*3/uL — ABNORMAL HIGH (ref 4.0–10.5)
WBC: 11.1 10*3/uL — ABNORMAL HIGH (ref 4.0–10.5)
nRBC: 0.1 % (ref 0.0–0.2)
nRBC: 0 % (ref 0.0–0.2)

## 2019-11-01 LAB — PREPARE CRYOPRECIPITATE: Unit division: 0

## 2019-11-01 LAB — BPAM CRYOPRECIPITATE
Blood Product Expiration Date: 202101010402
Unit Type and Rh: 5100

## 2019-11-01 LAB — BPAM PLATELET PHERESIS
Blood Product Expiration Date: 202101022359
ISSUE DATE / TIME: 202012312129
Unit Type and Rh: 5100

## 2019-11-01 LAB — PREPARE PLATELET PHERESIS: Unit division: 0

## 2019-11-01 MED ORDER — PRENATAL MULTIVITAMIN CH
1.0000 | ORAL_TABLET | Freq: Every day | ORAL | Status: DC
  Administered 2019-11-01 – 2019-11-02 (×2): 1 via ORAL
  Filled 2019-11-01 (×2): qty 1

## 2019-11-01 MED ORDER — FUROSEMIDE 20 MG PO TABS
20.0000 mg | ORAL_TABLET | Freq: Two times a day (BID) | ORAL | Status: DC
  Administered 2019-11-01 – 2019-11-02 (×3): 20 mg via ORAL
  Filled 2019-11-01 (×4): qty 1

## 2019-11-01 MED ORDER — ACETAMINOPHEN 325 MG PO TABS
650.0000 mg | ORAL_TABLET | Freq: Four times a day (QID) | ORAL | Status: DC | PRN
  Filled 2019-11-01: qty 2

## 2019-11-01 MED ORDER — WITCH HAZEL-GLYCERIN EX PADS: 1.0000 "application " | MEDICATED_PAD | CUTANEOUS | Status: DC | PRN

## 2019-11-01 MED ORDER — DIBUCAINE (PERIANAL) 1 % EX OINT: 1.0000 "application " | TOPICAL_OINTMENT | CUTANEOUS | Status: DC | PRN

## 2019-11-01 MED ORDER — SENNOSIDES-DOCUSATE SODIUM 8.6-50 MG PO TABS
2.0000 | ORAL_TABLET | ORAL | Status: DC
  Administered 2019-11-01 – 2019-11-02 (×2): 2 via ORAL
  Filled 2019-11-01 (×2): qty 2

## 2019-11-01 MED ORDER — TETANUS-DIPHTH-ACELL PERTUSSIS 5-2.5-18.5 LF-MCG/0.5 IM SUSP: 0.5000 mL | Freq: Once | INTRAMUSCULAR | Status: DC

## 2019-11-01 MED ORDER — ENOXAPARIN SODIUM 100 MG/ML ~~LOC~~ SOLN: 1.0000 mg/kg | Freq: Two times a day (BID) | SUBCUTANEOUS | Status: DC

## 2019-11-01 MED ORDER — ONDANSETRON HCL 4 MG PO TABS: 4.0000 mg | ORAL_TABLET | ORAL | Status: DC | PRN

## 2019-11-01 MED ORDER — BENZOCAINE-MENTHOL 20-0.5 % EX AERO
1.0000 "application " | INHALATION_SPRAY | CUTANEOUS | Status: DC | PRN
  Administered 2019-11-02: 1 via TOPICAL
  Filled 2019-11-01: qty 56

## 2019-11-01 MED ORDER — MEASLES, MUMPS & RUBELLA VAC IJ SOLR: 0.5000 mL | Freq: Once | INTRAMUSCULAR | Status: DC

## 2019-11-01 MED ORDER — IBUPROFEN 600 MG PO TABS
600.0000 mg | ORAL_TABLET | Freq: Three times a day (TID) | ORAL | Status: DC | PRN
  Administered 2019-11-01: 600 mg via ORAL
  Filled 2019-11-01: qty 1

## 2019-11-01 MED ORDER — ONDANSETRON HCL 4 MG/2ML IJ SOLN: 4.0000 mg | INTRAMUSCULAR | Status: DC | PRN

## 2019-11-01 MED ORDER — COCONUT OIL OIL: 1.0000 "application " | TOPICAL_OIL | Status: DC | PRN

## 2019-11-01 MED ORDER — METHYLERGONOVINE MALEATE 0.2 MG/ML IJ SOLN
0.2000 mg | INTRAMUSCULAR | Status: AC
  Administered 2019-11-01 (×4): 0.2 mg via INTRAMUSCULAR
  Filled 2019-11-01 (×4): qty 1

## 2019-11-01 MED ORDER — OXYCODONE HCL 5 MG PO TABS
5.0000 mg | ORAL_TABLET | ORAL | Status: DC | PRN
  Administered 2019-11-01 – 2019-11-02 (×6): 5 mg via ORAL
  Filled 2019-11-01 (×6): qty 1

## 2019-11-01 MED ORDER — SIMETHICONE 80 MG PO CHEW: 80.0000 mg | CHEWABLE_TABLET | ORAL | Status: DC | PRN

## 2019-11-01 MED ORDER — DIPHENHYDRAMINE HCL 25 MG PO CAPS: 25.0000 mg | ORAL_CAPSULE | Freq: Four times a day (QID) | ORAL | Status: DC | PRN

## 2019-11-01 NOTE — Progress Notes (Signed)
Dr. Rosana Hoes called back and said to leave Foley in. CBC ordered for now. No plans for TL at this time.

## 2019-11-01 NOTE — Progress Notes (Signed)
100cc water removed from bakri balloon per order. Pt tolerated well. 300cc remaining.

## 2019-11-01 NOTE — Plan of Care (Signed)
L&d care plan completed

## 2019-11-01 NOTE — Lactation Note (Addendum)
This note was copied from a baby's chart. Lactation Consultation Note  Patient Name: Girl Sarely Barrall [id number] Date: 11/01/2019 Reason for consult: Initial assessment;Term Postpartum hemorrhage 2000 EBL at delivery.  LC in to visit with P5 (preterm twins deceased) Mom of term baby at 12 hrs post delivery.  Mom has a PPH with EBL of almost 2000 ml.  Mom has been getting PRBCs and platelet infusions.    Mom choosing to bottle feed baby currently due to not feeling well.  Baby has had 2 5 ml feeding's and is sleeping STS on Mom's chest.   Mom's breastfeeding history is "trying with first baby" but he became jaundiced and didn't take to it, and with 2nd baby she breastfed for a month and then pumped for 3 more months.  Mom is not up to breastfeeding or pumping currently.    Mom aware of importance of early and frequent pumping due to her blood loss, and possible delay in milk onset and possible lower milk supply.  Offered to set up DEBP, but Mom states she will let us know if she decides to pump.  Mom doesn't have a pump at home, but does have [place].  Explained that [facility] loans pumps to Moms when referred due to difficulty or risk factors with lactation.  Lactation brochure left with Mom.  Mom aware of IP and OP lactation support, and knows she can call prn for assistance.   Plan- 1- Keep baby STS as much as possible 2- Offer breast with cues 3- if not interested in breastfeeding, baby should be fed EBM+/formula per volume guidelines 4- Pumping both breasts 15 mins on initiation setting, (ask nurse to set up DEBP and assist Mom with first pumping) 5- ask for lactation assistance prn    Interventions Interventions: Breast feeding basics reviewed;Skin to skin;Breast massage;Hand express  Lactation Tools Discussed/Used WIC Program: Yes   Consult Status Consult Status: Follow-up Date: 11/02/19 Follow-up type: In-patient    Broadus John 11/01/2019, 8:57 AM

## 2019-11-01 NOTE — Anesthesia Postprocedure Evaluation (Signed)
Anesthesia Post Note  Patient: Counsellor  Procedure(s) Performed: AN AD [place]     Patient location during evaluation: Mother Baby Anesthesia Type: Epidural Level of consciousness: awake and alert Pain management: pain level controlled Vital Signs Assessment: post-procedure vital signs reviewed and stable Respiratory status: spontaneous breathing, nonlabored ventilation and respiratory function stable Cardiovascular status: stable Postop Assessment: no headache, no backache and epidural receding Anesthetic complications: no    Last Vitals:  Vitals:   11/01/19 0745 11/01/19 0750  BP:  (!) 89/61  Pulse:  89  Resp:  18  Temp:  36.7 C  SpO2: 98% 98%    Last Pain:  Vitals:   11/01/19 0750  TempSrc: Oral  PainSc:    Pain Goal: Patients Stated Pain Goal: 5 (11/01/19 0740)              Epidural/Spinal Function Cutaneous sensation: Normal sensation (11/01/19 0800), Patient able to flex knees: Yes (11/01/19 0800), Patient able to lift hips off bed: Yes (11/01/19 0800), Back pain beyond tenderness at insertion site: No (11/01/19 0800), Progressively worsening motor and/or sensory loss: No (11/01/19 0800), Bowel and/or bladder incontinence post epidural: No (11/01/19 0800)  Edrei Norgaard

## 2019-11-01 NOTE — Progress Notes (Signed)
100cc water removed from bakri balloon per order. Pt tolerated well. 200cc remaining.

## 2019-11-01 NOTE — Progress Notes (Signed)
Post Partum Day 1 Subjective: cramping  Objective: Blood pressure (!) 93/57, pulse 89, temperature 97.8 F (36.6 C), temperature source Oral, resp. rate 20, height 5\' 6"  (1.676 m), weight 99.2 kg, last menstrual period 01/30/2019, SpO2 98 %, unknown if currently breastfeeding.  Physical Exam:  General: alert, cooperative and no distress Lochia: serosanguinous in Bakri bag Uterine Fundus: firm Incision: n/a DVT Evaluation: No evidence of DVT seen on physical exam.  Recent Labs    10/31/19 0030 10/31/19 2110  HGB 12.4 11.3*  HCT 36.7 32.9*    Assessment/Plan: S/p PP hemorrhage and transfusion, Bakri will be assessed for removal today   LOS: 1 day   Meghan Reed 11/01/2019, 7:41 AM

## 2019-11-01 NOTE — Progress Notes (Signed)
100cc water removed from bakri balloon per order. Pt tolerated well. 400cc remaining.

## 2019-11-01 NOTE — Progress Notes (Signed)
I introduced spiritual care services due to the history of IUFD as well as hemmorhage. Pt was in good spirits and stated no concerns at this time.  [place], [place] Pager, [phone] 2:28 PM    11/01/19 1400  Clinical Encounter Type  Visited With Patient and family together  Visit Type Initial

## 2019-11-01 NOTE — Progress Notes (Signed)
Attempted to call first attending regarding pt's questions about TL. Also wanted to ask about discontinuing foley catheter if pt able to ambulate later tonight after Bakri balloon is removed as well as labs in the morning. No answer at this time. Will pass on to oncoming RN to follow up.
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
## 2019-11-01 NOTE — Progress Notes (Signed)
100cc water removed from bakri balloon per order. Pt tolerated well. 100cc remaining.

## 2019-11-01 NOTE — Progress Notes (Addendum)
100cc water removed from bakri balloon per order. Pt tolerated well. 0cc remaining. Bakri and packing removed. Will monitor patient for bleeding.

## 2019-11-02 ENCOUNTER — Encounter (HOSPITAL_COMMUNITY): Payer: Self-pay | Admitting: Obstetrics and Gynecology

## 2019-11-02 LAB — BPAM FFP
Blood Product Expiration Date: 202101052359
ISSUE DATE / TIME: 202101010509
Unit Type and Rh: 7300

## 2019-11-02 LAB — CBC
HCT: 27 % — ABNORMAL LOW (ref 36.0–46.0)
Hemoglobin: 9.1 g/dL — ABNORMAL LOW (ref 12.0–15.0)
MCH: 30.1 pg (ref 26.0–34.0)
MCHC: 33.7 g/dL (ref 30.0–36.0)
MCV: 89.4 fL (ref 80.0–100.0)
Platelets: 90 10*3/uL — ABNORMAL LOW (ref 150–400)
RBC: 3.02 MIL/uL — ABNORMAL LOW (ref 3.87–5.11)
RDW: 16.7 % — ABNORMAL HIGH (ref 11.5–15.5)
WBC: 10.8 10*3/uL — ABNORMAL HIGH (ref 4.0–10.5)
nRBC: 0.2 % (ref 0.0–0.2)

## 2019-11-02 LAB — BPAM PLATELET PHERESIS
Blood Product Expiration Date: 202101032359
ISSUE DATE / TIME: 202101010842
Unit Type and Rh: 6200

## 2019-11-02 LAB — PREPARE FRESH FROZEN PLASMA

## 2019-11-02 LAB — PREPARE PLATELET PHERESIS: Unit division: 0

## 2019-11-02 MED ORDER — FERROUS SULFATE 325 (65 FE) MG PO TABS: 325.0000 mg | ORAL_TABLET | Freq: Every day | ORAL | 3 refills | Status: DC

## 2019-11-02 MED ORDER — IBUPROFEN 600 MG PO TABS: 600.0000 mg | ORAL_TABLET | Freq: Three times a day (TID) | ORAL | 0 refills | Status: DC | PRN

## 2019-11-02 NOTE — Progress Notes (Signed)
Pt discharged with all belongings. Pt in stable condition.

## 2019-11-02 NOTE — Lactation Note (Signed)
This note was copied from a baby's chart. Lactation Consultation Note  Patient Name: Meghan Reed Today's Date: 11/02/2019   LC in to visit with P5  Mom of term baby at 55 hrs old.  Mom asleep currently.  Pump kit unopened on counter.  Talked with RN.  RN states she asked Mom repeatedly about setting her up pumping, but Mom declined.  Mango asked RN to ask Mom if she would like a visit from lactation.     Broadus John 11/02/2019, 9:49 AM

## 2019-11-02 NOTE — Progress Notes (Signed)
Dr. Gifford Shave notified of pt's platelets 90 this morning down from 107 last night. MD said it is ok to d/c epidural catheter.

## 2019-11-02 NOTE — Discharge Instructions (Signed)

## 2019-11-02 NOTE — Progress Notes (Signed)
Discharge orders given and all questions answered.
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
## 2019-11-04 LAB — BPAM RBC
Blood Product Expiration Date: 202101292359
Blood Product Expiration Date: 202101292359
Blood Product Expiration Date: 202101292359
Blood Product Expiration Date: 202101302359
Blood Product Expiration Date: 202101302359
Blood Product Expiration Date: 202101312359
Blood Product Expiration Date: 202101312359
ISSUE DATE / TIME: 202012312130
ISSUE DATE / TIME: 202012312148
ISSUE DATE / TIME: 202012312148
ISSUE DATE / TIME: 202101010134
ISSUE DATE / TIME: 202101010423
Unit Type and Rh: 7300
Unit Type and Rh: 7300
Unit Type and Rh: 7300
Unit Type and Rh: 7300
Unit Type and Rh: 7300
Unit Type and Rh: 7300
Unit Type and Rh: 7300

## 2019-11-04 LAB — TYPE AND SCREEN
ABO/RH(D): B POS
Antibody Screen: NEGATIVE
Unit division: 0
Unit division: 0
Unit division: 0
Unit division: 0
Unit division: 0
Unit division: 0
Unit division: 0

## 2019-11-07 ENCOUNTER — Other Ambulatory Visit: Payer: Self-pay

## 2019-11-07 ENCOUNTER — Inpatient Hospital Stay: Payer: Medicaid Other

## 2019-11-07 ENCOUNTER — Inpatient Hospital Stay: Payer: Medicaid Other | Attending: Hematology and Oncology | Admitting: Hematology and Oncology

## 2019-11-07 VITALS — BP 116/87 | HR 66 | Temp 98.2°F | Resp 18 | Ht 66.0 in | Wt 210.8 lb

## 2019-11-07 DIAGNOSIS — I2699 Other pulmonary embolism without acute cor pulmonale: Secondary | ICD-10-CM | POA: Insufficient documentation

## 2019-11-07 DIAGNOSIS — O9081 Anemia of the puerperium: Secondary | ICD-10-CM | POA: Diagnosis not present

## 2019-11-07 DIAGNOSIS — D500 Iron deficiency anemia secondary to blood loss (chronic): Secondary | ICD-10-CM

## 2019-11-07 DIAGNOSIS — O88213 Thromboembolism in pregnancy, third trimester: Secondary | ICD-10-CM | POA: Diagnosis not present

## 2019-11-07 LAB — RETIC PANEL
Immature Retic Fract: 39.5 % — ABNORMAL HIGH (ref 2.3–15.9)
RBC.: 3.11 MIL/uL — ABNORMAL LOW (ref 3.87–5.11)
Retic Count, Absolute: 120 10*3/uL (ref 19.0–186.0)
Retic Ct Pct: 3.9 % — ABNORMAL HIGH (ref 0.4–3.1)
Reticulocyte Hemoglobin: 30 pg (ref >27.9)

## 2019-11-07 LAB — FERRITIN: Ferritin: 98 ng/mL (ref 11–307)

## 2019-11-07 LAB — CBC WITH DIFFERENTIAL (CANCER CENTER ONLY)
Abs Immature Granulocytes: 0.22 10*3/uL — ABNORMAL HIGH (ref 0.00–0.07)
Basophils Absolute: 0 10*3/uL (ref 0.0–0.1)
Basophils Relative: 1 %
Eosinophils Absolute: 0.2 10*3/uL (ref 0.0–0.5)
Eosinophils Relative: 2 %
HCT: 28.1 % — ABNORMAL LOW (ref 36.0–46.0)
Hemoglobin: 9.2 g/dL — ABNORMAL LOW (ref 12.0–15.0)
Immature Granulocytes: 4 %
Lymphocytes Relative: 26 %
Lymphs Abs: 1.6 10*3/uL (ref 0.7–4.0)
MCH: 29.6 pg (ref 26.0–34.0)
MCHC: 32.7 g/dL (ref 30.0–36.0)
MCV: 90.4 fL (ref 80.0–100.0)
Monocytes Absolute: 0.5 10*3/uL (ref 0.1–1.0)
Monocytes Relative: 8 %
Neutro Abs: 3.8 10*3/uL (ref 1.7–7.7)
Neutrophils Relative %: 59 %
Platelet Count: 180 10*3/uL (ref 150–400)
RBC: 3.11 MIL/uL — ABNORMAL LOW (ref 3.87–5.11)
RDW: 15.9 % — ABNORMAL HIGH (ref 11.5–15.5)
WBC Count: 6.3 10*3/uL (ref 4.0–10.5)
nRBC: 1.1 % — ABNORMAL HIGH (ref 0.0–0.2)

## 2019-11-07 LAB — CMP (CANCER CENTER ONLY)
ALT: 24 U/L (ref 0–44)
AST: 16 U/L (ref 15–41)
Albumin: 2.8 g/dL — ABNORMAL LOW (ref 3.5–5.0)
Alkaline Phosphatase: 68 U/L (ref 38–126)
Anion gap: 7 (ref 5–15)
BUN: 8 mg/dL (ref 6–20)
CO2: 27 mmol/L (ref 22–32)
Calcium: 8.3 mg/dL — ABNORMAL LOW (ref 8.9–10.3)
Chloride: 107 mmol/L (ref 98–111)
Creatinine: 0.67 mg/dL (ref 0.44–1.00)
GFR, Est AFR Am: >60 mL/min (ref >60)
GFR, Estimated: >60 mL/min (ref >60)
Glucose, Bld: 79 mg/dL (ref 70–99)
Potassium: 4.5 mmol/L (ref 3.5–5.1)
Sodium: 141 mmol/L (ref 135–145)
Total Bilirubin: 0.3 mg/dL (ref 0.3–1.2)
Total Protein: 5.6 g/dL — ABNORMAL LOW (ref 6.5–8.1)

## 2019-11-07 LAB — IRON AND TIBC
Iron: 71 ug/dL (ref 41–142)
Saturation Ratios: 26 % (ref 21–57)
TIBC: 278 ug/dL (ref 236–444)
UIBC: 206 ug/dL (ref 120–384)

## 2019-11-07 NOTE — Progress Notes (Signed)
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    Blood Product Unit Number [id number]    PRODUCT CODE E0336V00    Unit Type and Rh 7300    Blood Product Expiration Date 128786767209   CBC     Status: Abnormal   Collection Time: 10/31/19 12:30 AM  Result Value Ref  Range   WBC 8.0 4.0 - 10.5 K/uL   RBC 4.11 3.87 - 5.11 MIL/uL   Hemoglobin 12.4 12.0 - 15.0 g/dL   HCT 36.7 36.0 - 46.0 %   MCV 89.3 80.0 - 100.0 fL   MCH 30.2 26.0 - 34.0 pg   MCHC 33.8 30.0 - 36.0 g/dL   RDW 17.6 (H) 11.5 - 15.5 %   Platelets 125 (L) 150 - 400 K/uL    Comment: REPEATED TO VERIFY   nRBC 0.0 0.0 - 0.2 %    Comment: Performed at [facility], [place] [address], [place], [place] [address]  Comprehensive metabolic panel     Status: Abnormal   Collection Time: 10/31/19 12:30 AM  Result Value Ref Range   Sodium 136 135 - 145 mmol/L   Potassium 3.6 3.5 - 5.1 mmol/L   Chloride 106 98 - 111 mmol/L   CO2 22 22 - 32 mmol/L   Glucose, Bld 85 70 - 99 mg/dL   BUN 7 6 - 20 mg/dL   Creatinine, Ser 0.50 0.44 - 1.00 mg/dL   Calcium 8.8 (L) 8.9 - 10.3 mg/dL   Total Protein 6.1 (L) 6.5 - 8.1 g/dL   Albumin 3.0 (L) 3.5 - 5.0 g/dL   AST 17 15 - 41 U/L   ALT 15 0 - 44 U/L   Alkaline Phosphatase 88 38 - 126 U/L   Total Bilirubin 0.5 0.3 - 1.2 mg/dL   GFR calc non Af Amer >60 >60 mL/min   GFR calc Af Amer >60 >60 mL/min   Anion gap 8 5 - 15    Comment: Performed at [facility] [address] [place], [place] [address]  RPR     Status: None   Collection Time: 10/31/19 12:30 AM  Result Value Ref Range   RPR Ser Ql NON REACTIVE NON REACTIVE    Comment: Performed at [facility], [place] [address] [place], [place] [address]  APTT     Status: None   Collection Time: 10/31/19 12:30 AM  Result Value Ref Range   aPTT 24 24 - 36 seconds    Comment: Performed at [facility] [address], [place], [place] [address]  Fibrinogen     Status: Abnormal   Collection Time: 10/31/19 12:30 AM  Result Value Ref Range   Fibrinogen 498 (H) 210 - 475 mg/dL    Comment: Performed at [facility] [address], [place],  [address]  Protime-INR     Status: None   Collection Time: 10/31/19 12:30 AM  Result Value Ref Range   Prothrombin Time 13.5 11.4 - 15.2  seconds   INR 1.0 0.8 - 1.2    Comment: (NOTE) INR goal varies based on device and disease states. Performed at [facility], [place] [address] [place], [place] [address]   Prepare RBC     Status: None   Collection Time: 10/31/19  8:57 PM  Result Value Ref Range   Order Confirmation      ORDER PROCESSED BY BLOOD BANK Performed at [facility], [place] [address], [place], [place] [address]   Prepare Pheresed Platelets     Status: None   Collection Time: 10/31/19  8:58 PM  Result Value Ref Range   Unit Number [id number]    Blood Component Type PLTP LR2 PAS    Unit division 00    Status of Unit ISSUED,FINAL    Transfusion Status      OK TO TRANSFUSE Performed at [facility] [address] , [place] [address]   BPAM Platelet Pheresis     Status: None   Collection Time: 10/31/19  8:58 PM  Result Value Ref Range   ISSUE DATE / TIME 202012312129    Blood Product Unit Number [id number]    PRODUCT CODE E7003V00    Unit Type and Rh 5100    Blood Product Expiration Date 202101022359   CBC     Status: Abnormal   Collection Time: 10/31/19  9:10 PM  Result Value Ref Range   WBC 11.9 (H) 4.0 - 10.5 K/uL   RBC 3.66 (L) 3.87 - 5.11 MIL/uL   Hemoglobin 11.3 (L) 12.0 - 15.0 g/dL   HCT 32.9 (L) 36.0 - 46.0 %   MCV 89.9 80.0 - 100.0 fL   MCH 30.9 26.0 - 34.0 pg   MCHC 34.3 30.0 - 36.0 g/dL   RDW 17.6 (H) 11.5 - 15.5 %   Platelets 115 (L) 150 - 400 K/uL    Comment: REPEATED TO VERIFY PLATELET COUNT CONFIRMED BY SMEAR SPECIMEN CHECKED FOR CLOTS Immature Platelet Fraction may be clinically indicated, consider ordering this additional test [id number]    nRBC 0.3 (H) 0.0 - 0.2 %    Comment: Performed at [facility], [place] [address], [place], [place] [address]  DIC (disseminated intravasc coag) panel (STAT)     Status: Abnormal   Collection Time: 10/31/19  9:10 PM  Result Value Ref Range   Prothrombin Time 17.9 (H) 11.4 - 15.2 seconds   INR 1.5 (H) 0.8  - 1.2    Comment: (NOTE) INR goal varies based on device and disease states.    aPTT 32 24 - 36 seconds   Fibrinogen 168 (L) 210 - 475 mg/dL   D-Dimer, Quant >20.00 (H) 0.00 - 0.50 ug/mL-FEU    Comment: REPEATED TO VERIFY (NOTE) At the manufacturer cut-off of 0.50 ug/mL FEU, this assay has been documented to exclude PE with a sensitivity and negative predictive value of 97 to 99%.  At this time, this assay has not been approved by the FDA to exclude DVT/VTE. Results should be correlated with clinical presentation.    Platelets 113 (L) 150 - 400 K/uL    Comment: Immature Platelet Fraction may be clinically indicated, consider ordering this additional test [id number] CONSISTENT WITH PREVIOUS RESULT    Smear Review NO SCHISTOCYTES SEEN     Comment: Performed at [facility], [place] [address], [place],  [address]  Prepare fresh frozen plasma  Status: None   Collection Time: 10/31/19  9:19 PM  Result Value Ref Range   Unit Number [id number]    Blood Component Type THW PLS APHR    Unit division A0    Status of Unit ISSUED,FINAL    Transfusion Status      OK TO TRANSFUSE Performed at [facility] [address], [place], [place] [address]   BPAM FFP     Status: None   Collection Time: 10/31/19  9:19 PM  Result Value Ref Range   ISSUE DATE / TIME [id number]    Blood Product Unit Number [id number]    PRODUCT CODE E2121VA0    Unit Type and Rh 7300    Blood Product Expiration Date 830940768088   Prepare Pheresed Platelets     Status: None   Collection Time: 10/31/19  9:20 PM  Result Value Ref Range   Unit Number [id number]    Blood Component Type PLTP LR3 PAS    Unit division 00    Status of Unit ISSUED,FINAL    Transfusion Status      OK TO TRANSFUSE Performed at [facility], [place] [address] [place], [place] [address]   Prepare RBC     Status: None   Collection Time: 10/31/19  9:20 PM  Result Value Ref Range   Order Confirmation       ORDER PROCESSED BY BLOOD BANK Performed at [facility], [place] [address] [place], [place] [address]   BPAM Platelet Pheresis     Status: None   Collection Time: 10/31/19  9:20 PM  Result Value Ref Range   ISSUE DATE / TIME 177116579038    Blood Product Unit Number [id number]    PRODUCT CODE A0045T97    Unit Type and Rh 6200    Blood Product Expiration Date 741423953202   Prepare cryoprecipitate     Status: None   Collection Time: 10/31/19 10:01 PM  Result Value Ref Range   Unit Number [id number]    Blood Component Type CRYPOOL THAW    Unit division 00    Status of Unit REL FROM [facility]    Transfusion Status OK TO TRANSFUSE   BPAM Cryoprecipitate     Status: None   Collection Time: 10/31/19 10:01 PM  Result Value Ref Range   Blood Product Unit Number [id number]    PRODUCT CODE E2336P22    Unit Type and Rh 5100    Blood Product Expiration Date 449753005110   CBC     Status: Abnormal   Collection Time: 11/01/19  6:07 AM  Result Value Ref Range   WBC 15.0 (H) 4.0 - 10.5 K/uL   RBC 3.98 3.87 - 5.11 MIL/uL   Hemoglobin 12.0 12.0 - 15.0 g/dL   HCT 34.5 (L) 36.0 - 46.0 %   MCV 86.7 80.0 - 100.0 fL   MCH 30.2 26.0 - 34.0 pg   MCHC 34.8 30.0 - 36.0 g/dL   RDW 16.0 (H) 11.5 - 15.5 %   Platelets 67 (L) 150 - 400 K/uL    Comment: REPEATED TO VERIFY PLATELET COUNT CONFIRMED BY SMEAR SPECIMEN CHECKED FOR CLOTS Immature Platelet Fraction may be clinically indicated, consider ordering this additional test [id number]    nRBC 0.1 0.0 - 0.2 %    Comment: Performed at [facility], [place]. [address], [place], [place] [address]  CBC     Status: Abnormal   Collection Time: 11/01/19  7:48 PM  Result Value Ref Range  WBC 11.1 (H) 4.0 - 10.5 K/uL   RBC 3.45 (L) 3.87 - 5.11 MIL/uL   Hemoglobin 10.4 (L) 12.0 - 15.0 g/dL   HCT 30.1 (L) 36.0 - 46.0 %   MCV 87.2 80.0 - 100.0 fL   MCH 30.1 26.0 - 34.0 pg   MCHC 34.6 30.0 - 36.0 g/dL   RDW 16.8 (H) 11.5 - 15.5 %   Platelets  107 (L) 150 - 400 K/uL    Comment: REPEATED TO VERIFY SPECIMEN CHECKED FOR CLOTS Immature Platelet Fraction may be clinically indicated, consider ordering this additional test [id number] CONSISTENT WITH PREVIOUS RESULT    nRBC 0.0 0.0 - 0.2 %    Comment: Performed at [facility], [place] [address] [place], [place] [address]  CBC     Status: Abnormal   Collection Time: 11/02/19  6:48 AM  Result Value Ref Range   WBC 10.8 (H) 4.0 - 10.5 K/uL   RBC 3.02 (L) 3.87 - 5.11 MIL/uL   Hemoglobin 9.1 (L) 12.0 - 15.0 g/dL   HCT 27.0 (L) 36.0 - 46.0 %   MCV 89.4 80.0 - 100.0 fL   MCH 30.1 26.0 - 34.0 pg   MCHC 33.7 30.0 - 36.0 g/dL   RDW 16.7 (H) 11.5 - 15.5 %   Platelets 90 (L) 150 - 400 K/uL    Comment: REPEATED TO VERIFY SPECIMEN CHECKED FOR CLOTS Immature Platelet Fraction may be clinically indicated, consider ordering this additional test [id number] CONSISTENT WITH PREVIOUS RESULT    nRBC 0.2 0.0 - 0.2 %    Comment: Performed at [facility], [place] [address], [place], [place] [address]  Ferritin     Status: None   Collection Time: 11/07/19  1:56 PM  Result Value Ref Range   Ferritin 98 11 - 307 ng/mL    Comment: Performed at [facility], [place] [address], [place], [place] [address]  Iron and TIBC     Status: None   Collection Time: 11/07/19  1:56 PM  Result Value Ref Range   Iron 71 41 - 142 ug/dL   TIBC 278 236 - 444 ug/dL   Saturation Ratios 26 21 - 57 %   UIBC 206 120 - 384 ug/dL    Comment: Performed at [facility], [place] [address], [place], [place] [address]  CMP ([facility] only)     Status: Abnormal   Collection Time: 11/07/19  1:56 PM  Result Value Ref Range   Sodium 141 135 - 145 mmol/L   Potassium 4.5 3.5 - 5.1 mmol/L   Chloride 107 98 - 111 mmol/L   CO2 27 22 - 32 mmol/L   Glucose, Bld 79 70 - 99 mg/dL   BUN 8 6 - 20 mg/dL   Creatinine 0.67 0.44 - 1.00 mg/dL   Calcium 8.3 (L) 8.9 - 10.3 mg/dL   Total  Protein 5.6 (L) 6.5 - 8.1 g/dL   Albumin 2.8 (L) 3.5 - 5.0 g/dL   AST 16 15 - 41 U/L   ALT 24 0 - 44 U/L   Alkaline Phosphatase 68 38 - 126 U/L   Total Bilirubin 0.3 0.3 - 1.2 mg/dL   GFR, Est Non Af Am >60 >60 mL/min   GFR, Est AFR Am >60 >60 mL/min   Anion gap 7 5 - 15    Comment: Performed at [facility], [address], [place], [place] [address]  CBC with Differential [facility]  Only)     Status: Abnormal   Collection Time: 11/07/19  1:56 PM  Result Value Ref Range   WBC Count 6.3 4.0 - 10.5 K/uL   RBC 3.11 (L) 3.87 - 5.11 MIL/uL   Hemoglobin 9.2 (L) 12.0 - 15.0 g/dL   HCT 28.1 (L) 36.0 - 46.0 %   MCV 90.4 80.0 - 100.0 fL   MCH 29.6 26.0 - 34.0 pg   MCHC 32.7 30.0 - 36.0 g/dL   RDW 15.9 (H) 11.5 - 15.5 %   Platelet Count 180 150 - 400 K/uL   nRBC 1.1 (H) 0.0 - 0.2 %   Neutrophils Relative % 59 %   Neutro Abs 3.8 1.7 - 7.7 K/uL   Lymphocytes Relative 26 %   Lymphs Abs 1.6 0.7 - 4.0 K/uL   Monocytes Relative 8 %   Monocytes Absolute 0.5 0.1 - 1.0 K/uL   Eosinophils Relative 2 %   Eosinophils Absolute 0.2 0.0 - 0.5 K/uL   Basophils Relative 1 %   Basophils Absolute 0.0 0.0 - 0.1 K/uL   Immature Granulocytes 4 %   Abs Immature Granulocytes 0.22 (H) 0.00 - 0.07 K/uL    Comment: Performed at [facility], [address], [place], [place] [address]  Retic Panel     Status: Abnormal   Collection Time: 11/07/19  1:57 PM  Result Value Ref Range   Retic Ct Pct 3.9 (H) 0.4 - 3.1 %   RBC. 3.11 (L) 3.87 - 5.11 MIL/uL   Retic Count, Absolute 120.0 19.0 - 186.0 K/uL   Immature Retic Fract 39.5 (H) 2.3 - 15.9 %   Reticulocyte Hemoglobin 30.0 >27.9 pg    Comment: Performed at [facility], [place] [address] [place], [place] [address]    RADIOGRAPHIC STUDIES:  US FETAL BPP W/NONSTRESS  Result Date: 11/06/2019 ----------------------------------------------------------------------  OBSTETRICS REPORT                        (Signed Final 11/06/2019 01:37 pm) ---------------------------------------------------------------------- Patient Info  ID #:       [id number]                          D.O.B.:  [birth year] (33 yrs)  Name:       BELMA R                         Visit Date: 10/23/2019 04:29 pm              [place] ---------------------------------------------------------------------- Performed By  Performed By:     Shauna Hugh Day RNC          Secondary Phy.:   MATTHEW Lamount Cohen MD  Attending:        Darron Doom MD         Address:          [address]  [place], [place]  Referred By:      Essie Hart               Location:         [facility]                                                             [facility]  Ref. Address:     [address]                    [address] ---------------------------------------------------------------------- Orders   #  Description                          Code         Ordered By   1  US FETAL BPP W/NONSTRESS             62130.8      Aletha Halim  ----------------------------------------------------------------------   #  Order #                    Accession #                 Episode #   1  [id number]                  [id number]                  [id number]  ---------------------------------------------------------------------- Service(s) Provided   US Fetal BPP W NST                                   [phone]  ---------------------------------------------------------------------- Indications   [redacted] weeks gestation of pregnancy                Z3A.38   Poor obstetric history: Previous IUFD          O48.299   (stillbirth)   ---------------------------------------------------------------------- Vital Signs  Height:        5'6" ---------------------------------------------------------------------- Fetal Evaluation  Num Of Fetuses:         1  Preg. Location:         Intrauterine  Cardiac Activity:       Observed  Presentation:           Cephalic  Amniotic Fluid  AFI FV:      Within normal limits  AFI Sum(cm)     %Tile       Largest Pocket(cm)  14.11           54          5.84  RUQ(cm)       RLQ(cm)       LUQ(cm)        LLQ(cm)  5.84          3.05          2.33           2.89 ---------------------------------------------------------------------- Biophysical Evaluation  Amniotic F.V:   Pocket => 2 cm             F. Tone:        Observed  F. Movement:    Observed                   N.S.T:          Reactive  F. Breathing:   Observed                   Score:          10/10 ---------------------------------------------------------------------- OB History  Gravidity:    8         Term:   2        Prem:   1        SAB:   4  Living:       2 ---------------------------------------------------------------------- Gestational Age  Clinical EDD:  38w 0d                                        EDD:   11/06/19  Best:          38w 0d     Det. By:  Clinical EDD             EDD:   11/06/19 ---------------------------------------------------------------------- Impression  BPP 8/8 ---------------------------------------------------------------------- Recommendations  -Continue weekly BPP till delivery. ----------------------------------------------------------------------                   Darron Doom, MD Electronically Signed Final Report   11/06/2019 01:37 pm ----------------------------------------------------------------------  US FETAL BPP W/NONSTRESS  Result Date: 10/16/2019 ----------------------------------------------------------------------  OBSTETRICS REPORT                        (Signed Final  10/16/2019 03:14 pm) ---------------------------------------------------------------------- Patient Info  ID #:       [id number]                          D.O.B.:  1986-02-03 (33 yrs)  Name:       Raquel R                         Visit Date: 10/16/2019 10:10 am              [place] ----------------------------------------------------------------------  Performed By  Performed By:     Shauna Hugh Day RNC          Secondary Phy.:    MATTHEW Lamount Cohen MD  Attending:        Mora Bellman MD      Address:           [address] [place] [address]                                                              [place], [place]  Referred By:      Essie Hart               Location:          [facility]                                                              [facility]  Ref. Address:     [address]                    [address] ---------------------------------------------------------------------- Orders   #  Description                           Code        Ordered By   1  US FETAL BPP W/NONSTRESS              46270.3     [place]  ----------------------------------------------------------------------   #  Order #  Accession #                Episode #   1  [id number]                   [id number]                 [id number]  ---------------------------------------------------------------------- Service(s) Provided   US Fetal BPP W NST                                    [phone]  ---------------------------------------------------------------------- Indications   [redacted] weeks gestation of pregnancy                Z3A.37   Poor obstetric history: Previous IUFD          O09.299   (stillbirth)   ---------------------------------------------------------------------- Vital Signs                                                 Height:        5'6" ---------------------------------------------------------------------- Fetal Evaluation  Num Of Fetuses:          1  Preg. Location:          Intrauterine  Cardiac Activity:        Observed  Presentation:            Breech, complete  Amniotic Fluid  AFI FV:      Within normal limits  AFI Sum(cm)     %Tile       Largest Pocket(cm)  18.1            69          5.54  RUQ(cm)       RLQ(cm)       LUQ(cm)        LLQ(cm)  5.48          3.57          5.54           3.51 ---------------------------------------------------------------------- Biophysical Evaluation  Amniotic F.V:   Pocket => 2 cm             F. Tone:         Observed  F. Movement:    Observed                   N.S.T:           Reactive  F. Breathing:   Observed                   Score:           10/10 ---------------------------------------------------------------------- OB History  Gravidity:    8         Term:   2        Prem:   1        SAB:   4  Living:       2 ---------------------------------------------------------------------- Gestational Age  Clinical EDD:  37w 0d                                        EDD:   11/06/19  Best:  37w 0d     Det. By:  Clinical EDD             EDD:   11/06/19 ---------------------------------------------------------------------- Impression  Viable fetus in breech presentation  BPP 10/10 ---------------------------------------------------------------------- Recommendations  -Continue weekly BPP till delivery.  - Patient counseled and scheduled for ECV ----------------------------------------------------------------------                 Mora Bellman, MD Electronically Signed Final Report   10/16/2019 03:14 pm ----------------------------------------------------------------------  US FETAL BPP W/NONSTRESS  Result Date:  10/09/2019 ----------------------------------------------------------------------  OBSTETRICS REPORT                       (Signed Final 10/09/2019 10:02 am) ---------------------------------------------------------------------- Patient Info  ID #:       [id number]                          D.O.B.:  [birth year] (33 yrs)  Name:       BARBAR R                         Visit Date: 10/09/2019 09:13 am              [place] ---------------------------------------------------------------------- Performed By  Performed By:     Shauna Hugh Day RNC          Secondary Phy.:   MATTHEW Lamount Cohen MD  Attending:        Aletha Halim MD     Address:          [address] [place] [address]                                                             [place], [place]  Referred By:      Essie Hart               Location:         [facility]  Ref. Address:     [address]                    [address] ---------------------------------------------------------------------- Orders   #  Description                          Code         Ordered By   1  US FETAL BPP W/NONSTRESS             27078.6      Aletha Halim  ----------------------------------------------------------------------   #  Order #                    Accession #                 Episode #   1  [id number]                  [id number]                  [id number]  ---------------------------------------------------------------------- Service(s) Provided   US Fetal BPP W NST                                   [phone]  ---------------------------------------------------------------------- Indications   [redacted] weeks gestation of pregnancy                Z3A.36   Poor  obstetric history: Previous IUFD          O30.299   (stillbirth)   Medical complication of pregnancy (specify)    O26.90   Acute pulmonary embolism  ---------------------------------------------------------------------- Vital Signs                                                 Height:        5'6" ---------------------------------------------------------------------- Fetal Evaluation  Num Of Fetuses:         1  Preg. Location:         Intrauterine  Cardiac Activity:       Observed  Presentation:           Breech, complete  Amniotic Fluid  AFI FV:      Within normal limits  AFI Sum(cm)     %Tile       Largest Pocket(cm)  18.35           68          5.62  RUQ(cm)       RLQ(cm)       LUQ(cm)        LLQ(cm)  3.95          5.62          5.17           3.61 ---------------------------------------------------------------------- Biophysical Evaluation  Amniotic F.V:   Pocket => 2 cm  F. Tone:        Observed  F. Movement:    Observed                   N.S.T:          Reactive  F. Breathing:   Observed                   Score:          10/10 ---------------------------------------------------------------------- OB History  Gravidity:    8         Term:   2        Prem:   1        SAB:   4  Living:       2 ---------------------------------------------------------------------- Gestational Age  Clinical EDD:  36w 0d                                        EDD:   11/06/19  Best:          36w 0d     Det. By:  Clinical EDD             EDD:   11/06/19 ---------------------------------------------------------------------- Impression  Reassuring antenatal testing. Unstable lie ---------------------------------------------------------------------- Recommendations  Continue weekly BPPs. If still breech, next week. discuss with  patient regarding ECV at 37wks. ----------------------------------------------------------------------                 Aletha Halim, MD Electronically Signed Final Report   10/09/2019 10:02 am  ----------------------------------------------------------------------   ASSESSMENT & PLAN Marijean Heath 34 y.o. 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  After review of the labs and discussion with the patient it appears that she does have an anemia secondary to postpartum hemorrhage.  Today we will collect her iron studies to determine if IV iron will be necessary to help her replete her hemoglobin.  Additionally it sounds as though her anemia is most closely related to her shortness of breath and other symptoms, and therefore I would recommend that we can discontinue anticoagulation 6 weeks following delivery on 12/12/2019.  We will contact the patient closer that time to assure that she is not having any other symptoms of VTE before discontinuation.  Additionally it appears that her thrombocytopenia was likely also due to this postpartum hemorrhage and therefore is likely to have rebounded in the interim.  Once anticoagulation is discontinued we will have the patient follow-up in approximately 3 months time to assure that her blood counts have normalized and that she has no signs or symptoms of residual VTE.  #Pulmonary Embolism in Setting of Pregnancy --please continue lovenox at '1mg'$ /kg q12H. The recommendation is for 3-6 months of anticoagulation, with at least 6 weeks of anticoagulation post partum.  --given the delivery date of 10/31/2019, we recommend continuing lovenox until 12/12/2019.  --RTC in 3 months after that date to reassess  #Anemia in the Post Partum Period #Post Partum Hemorrhage --will collect nutritional studies today to include iron panel, reticulocyte panel --patient is currently taking PO iron '325mg'$  daily with a source of vitamin C --if Hgb levels are not improving from Hgb 9.1 on 11/02/2019 we can consider re-initiating IV iron  --continue to  monitor  #Thrombocytopenia s/p Post Partum Hemorrhage --Plt 90  on last check (11/02/2019). Likely 2/2 to dilution from pregnancy and consumption in the setting of post partum hemorrhage. --review CBC and Plt count today.  --continue to monitor platelet count.   Orders Placed This Encounter  Procedures   CBC with Differential ([facility] Only)    Standing Status:   Future    Standing Expiration Date:   11/06/2020   Retic Panel    Standing Status:   Future    Standing Expiration Date:   11/06/2020   CMP ([facility] only)    Standing Status:   Future    Standing Expiration Date:   11/06/2020   Iron and TIBC    Standing Status:   Future    Standing Expiration Date:   11/06/2020   Ferritin    Standing Status:   Future    Standing Expiration Date:   11/06/2020    All questions were answered. The patient knows to call the clinic with any problems, questions or concerns.  A total of more than 30 minutes were spent on this encounter and over half of that time was spent on counseling and coordination of care as outlined above.   [name], MD Department of Hematology/Oncology [place] at [facility] Phone: [phone] Pager: [phone] Email: [email or web address]  11/07/2019 1:29 PM

## 2019-11-11 ENCOUNTER — Telehealth: Payer: Self-pay | Admitting: *Deleted

## 2019-11-11 NOTE — Telephone Encounter (Signed)
TCT patient and informed her of her recent lab results.  Spoke with her and she voiced understanding. She will continue her oral iron. She is have labs re-checked in 1 month.   She will continue her lovenox until 12/12/19 I will call her at that time to remind her of that and assess resp status/fatigue.. She voiced understanding

## 2019-11-11 NOTE — Telephone Encounter (Signed)
-----   Message from Orson Slick, MD sent at 11/10/2019  4:00 PM EST ----- Eustaquio Maize,  Please let Mrs. Scruggs know her Hgb is low, but that she a good store of iron in her body. She should keep taking her iron pills and we will check her Hgb again in 1 month. She does not need IV iron at this time. Additionally she should keep taking the lovenox and we will call her prior to the 12/12/2019 stop date to make sure her shortness of breath is improving.  [name] ----- Message ----- From: Buel Ream, Lab In [place] Sent: 11/07/2019   2:06 PM EST To: Orson Slick, MD

## 2019-11-22 ENCOUNTER — Telehealth: Payer: Self-pay | Admitting: Lactation Services

## 2019-11-22 NOTE — Telephone Encounter (Signed)
Pt called in regards to her PP appt. She was informed that she has a Virtual PP appt on 2/1 at 3:55 for her PP visit.   Pt reports she was not able to get her BTL while in the hospital but would like to pursue. Will send message  to surgery scheduler [facility].   Pt with no further questions or concerns at this time.

## 2019-12-02 ENCOUNTER — Telehealth: Payer: Medicaid Other | Admitting: Nurse Practitioner

## 2019-12-05 ENCOUNTER — Other Ambulatory Visit: Payer: Self-pay | Admitting: Hematology and Oncology

## 2019-12-05 DIAGNOSIS — D500 Iron deficiency anemia secondary to blood loss (chronic): Secondary | ICD-10-CM

## 2019-12-06 ENCOUNTER — Telehealth: Payer: Self-pay | Admitting: Hematology and Oncology

## 2019-12-06 ENCOUNTER — Inpatient Hospital Stay: Payer: Medicaid Other

## 2019-12-06 ENCOUNTER — Other Ambulatory Visit: Payer: Self-pay | Admitting: Hematology and Oncology

## 2019-12-06 NOTE — Telephone Encounter (Signed)
Rescheduled per 2/5 sch msg. Called and left a msg.

## 2019-12-12 ENCOUNTER — Other Ambulatory Visit: Payer: Self-pay

## 2019-12-12 ENCOUNTER — Ambulatory Visit (INDEPENDENT_AMBULATORY_CARE_PROVIDER_SITE_OTHER): Payer: Medicaid Other | Admitting: Obstetrics & Gynecology

## 2019-12-12 ENCOUNTER — Encounter: Payer: Self-pay | Admitting: Obstetrics & Gynecology

## 2019-12-12 DIAGNOSIS — Z3009 Encounter for other general counseling and advice on contraception: Secondary | ICD-10-CM
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
## 2019-12-12 NOTE — Patient Instructions (Signed)
Laparoscopic Tubal Ligation Laparoscopic tubal ligation is a procedure to close the fallopian tubes. This is done so that you cannot get pregnant. When the fallopian tubes are closed, the eggs that your ovaries release cannot enter the uterus, and sperm cannot reach the released eggs. You should not have this procedure if you want to get pregnant someday or if you are unsure about having more children. Tell a health care provider about:  Any allergies you have.  All medicines you are taking, including vitamins, herbs, eye drops, creams, and over-the-counter medicines.  Any problems you or family members have had with anesthetic medicines.  Any blood disorders you have.  Any surgeries you have had.  Any medical conditions you have.  Whether you are pregnant or may be pregnant.  Any past pregnancies. What are the risks? Generally, this is a safe procedure. However, problems may occur, including:  Infection.  Bleeding.  Injury to other organs in the abdomen.  Side effects from anesthetic medicines.  Failure of the procedure. This procedure can increase your risk of a kind of pregnancy in which a fertilized egg attaches to the outside of the uterus (ectopic pregnancy). What happens before the procedure? Medicines  Ask your health care provider about: ? Changing or stopping your regular medicines. This is especially important if you are taking diabetes medicines or blood thinners. ? Taking medicines such as aspirin and ibuprofen. These medicines can thin your blood. Do not take these medicines unless your health care provider tells you to take them. ? Taking over-the-counter medicines, vitamins, herbs, and supplements. Staying hydrated  Follow instructions from your health care provider about hydration, which may include: ? Up to 2 hours before the procedure - you may continue to drink clear liquids, such as water, clear fruit juice, black coffee, and plain tea. Eating and  drinking  Follow instructions from your health care provider about eating and drinking, which may include: ? 8 hours before the procedure - stop eating heavy meals or foods, such as meat, fried foods, or fatty foods. ? 6 hours before the procedure - stop eating light meals or foods, such as toast or cereal. ? 6 hours before the procedure - stop drinking milk or drinks that contain milk. ? 2 hours before the procedure - stop drinking clear liquids. General instructions  Do not use any products that contain nicotine or tobacco for at least 4 weeks before the procedure. These products include cigarettes, e-cigarettes, and chewing tobacco. If you need help quitting, ask your health care provider.  Plan to have someone take you home from the hospital.  If you will be going home right after the procedure, plan to have someone with you for 24 hours.  Ask your health care provider: ? How your surgery site will be marked. ? What steps will be taken to help prevent infection. These may include:  Removing hair at the surgery site.  Washing skin with a germ-killing soap.  Taking antibiotic medicine. What happens during the procedure?      An IV will be inserted into one of your veins.  You will be given one or more of the following: ? A medicine to help you relax (sedative). ? A medicine to numb the area (local anesthetic). ? A medicine to make you fall asleep (general anesthetic). ? A medicine that is injected into an area of your body to numb everything below the injection site (regional anesthetic).  Your bladder may be emptied with a small tube (  catheter).  If you have been given a general anesthetic, a tube will be put down your throat to help you breathe.  Two small incisions will be made in your lower abdomen and near your belly button.  Your abdomen will be inflated with a gas. This will let the surgeon see better and will give the surgeon room to work.  A thin, lighted tube  (laparoscope) with a camera attached will be inserted into your abdomen through one of the incisions. Small instruments will be inserted through the other incision.  The fallopian tubes will be tied off, burned (cauterized), or blocked with a clip, ring, or clamp. A small portion in the center of each fallopian tube may be removed.  The gas will be released from the abdomen.  The incisions will be closed with stitches (sutures).  A bandage (dressing) will be placed over the incisions. The procedure may vary among health care providers and hospitals. What happens after the procedure?  Your blood pressure, heart rate, breathing rate, and blood oxygen level will be monitored until you leave the hospital.  You will be given medicine to help with pain, nausea, and vomiting as needed. Summary  Laparoscopic tubal ligation is a procedure that is done so that you cannot get pregnant.  You should not have this procedure if you want to get pregnant someday or if you are unsure about having more children.  The procedure is done using a thin, lighted tube (laparoscope) with a camera attached that will be inserted into your abdomen through an incision.  Follow instructions from your health care provider about eating and drinking before the procedure. This information is not intended to replace advice given to you by your health care provider. Make sure you discuss any questions you have with your health care provider. Document Revised: 03/26/2019 Document Reviewed: 09/11/2018 Elsevier Patient Education  2020 Elsevier Inc.  

## 2019-12-13 ENCOUNTER — Inpatient Hospital Stay: Payer: Medicaid Other | Attending: Hematology and Oncology

## 2019-12-13 ENCOUNTER — Other Ambulatory Visit: Payer: Self-pay

## 2019-12-13 ENCOUNTER — Telehealth: Payer: Self-pay | Admitting: *Deleted

## 2019-12-13 DIAGNOSIS — O9081 Anemia of the puerperium: Secondary | ICD-10-CM | POA: Diagnosis present

## 2019-12-13 DIAGNOSIS — D500 Iron deficiency anemia secondary to blood loss (chronic): Secondary | ICD-10-CM

## 2019-12-13 LAB — CBC WITH DIFFERENTIAL (CANCER CENTER ONLY)
Abs Immature Granulocytes: 0.02 10*3/uL (ref 0.00–0.07)
Basophils Absolute: 0 10*3/uL (ref 0.0–0.1)
Basophils Relative: 1 %
Eosinophils Absolute: 0.2 10*3/uL (ref 0.0–0.5)
Eosinophils Relative: 3 %
HCT: 40.6 % (ref 36.0–46.0)
Hemoglobin: 13.4 g/dL (ref 12.0–15.0)
Immature Granulocytes: 0 %
Lymphocytes Relative: 33 %
Lymphs Abs: 2 10*3/uL (ref 0.7–4.0)
MCH: 29.1 pg (ref 26.0–34.0)
MCHC: 33 g/dL (ref 30.0–36.0)
MCV: 88.1 fL (ref 80.0–100.0)
Monocytes Absolute: 0.4 10*3/uL (ref 0.1–1.0)
Monocytes Relative: 7 %
Neutro Abs: 3.5 10*3/uL (ref 1.7–7.7)
Neutrophils Relative %: 56 %
Platelet Count: 208 10*3/uL (ref 150–400)
RBC: 4.61 MIL/uL (ref 3.87–5.11)
RDW: 13.9 % (ref 11.5–15.5)
WBC Count: 6.1 10*3/uL (ref 4.0–10.5)
nRBC: 0 % (ref 0.0–0.2)

## 2019-12-13 LAB — FERRITIN: Ferritin: 46 ng/mL (ref 11–307)

## 2019-12-13 LAB — CMP (CANCER CENTER ONLY)
ALT: 28 U/L (ref 0–44)
AST: 19 U/L (ref 15–41)
Albumin: 4.2 g/dL (ref 3.5–5.0)
Alkaline Phosphatase: 60 U/L (ref 38–126)
Anion gap: 7 (ref 5–15)
BUN: 11 mg/dL (ref 6–20)
CO2: 28 mmol/L (ref 22–32)
Calcium: 9.1 mg/dL (ref 8.9–10.3)
Chloride: 105 mmol/L (ref 98–111)
Creatinine: 0.82 mg/dL (ref 0.44–1.00)
GFR, Est AFR Am: >60 mL/min (ref >60)
GFR, Estimated: >60 mL/min (ref >60)
Glucose, Bld: 97 mg/dL (ref 70–99)
Potassium: 4.2 mmol/L (ref 3.5–5.1)
Sodium: 140 mmol/L (ref 135–145)
Total Bilirubin: 0.4 mg/dL (ref 0.3–1.2)
Total Protein: 7.3 g/dL (ref 6.5–8.1)

## 2019-12-13 LAB — RETIC PANEL
Immature Retic Fract: 10.2 % (ref 2.3–15.9)
RBC.: 4.62 MIL/uL (ref 3.87–5.11)
Retic Count, Absolute: 59.6 10*3/uL (ref 19.0–186.0)
Retic Ct Pct: 1.3 % (ref 0.4–3.1)
Reticulocyte Hemoglobin: 34.1 pg (ref >27.9)

## 2019-12-13 LAB — IRON AND TIBC
Iron: 58 ug/dL (ref 41–142)
Saturation Ratios: 18 % — ABNORMAL LOW (ref 21–57)
TIBC: 326 ug/dL (ref 236–444)
UIBC: 269 ug/dL (ref 120–384)

## 2019-12-13 NOTE — Telephone Encounter (Signed)
TCT patient regarding lab results from today. Spoke with patient and she states she feels much better, that she feels like herself again. Advised that her labs looked good, that her HGB is much much better @ 13.4.  Advised that she can stop her lovenox now.  Pt states she is not having any chest pain, SOB , leg swelling, calf pain etc.  She feels comfortable stopping the lovenox.  She states she willl be having a tubal ligation in the next 1-2 weeks, and being off the lovenox makes her more comfortable about that procedure.  She voiced appreciation for the call back.  She knows to call back with any questions or concerns.

## 2019-12-16 ENCOUNTER — Other Ambulatory Visit: Payer: Self-pay | Admitting: Obstetrics & Gynecology

## 2019-12-16 NOTE — Progress Notes (Signed)
Orders for surgery 

## 2019-12-26 ENCOUNTER — Other Ambulatory Visit: Payer: Self-pay

## 2019-12-26 ENCOUNTER — Encounter (HOSPITAL_BASED_OUTPATIENT_CLINIC_OR_DEPARTMENT_OTHER): Payer: Self-pay | Admitting: Obstetrics & Gynecology

## 2019-12-28 ENCOUNTER — Other Ambulatory Visit (HOSPITAL_COMMUNITY)
Admission: RE | Admit: 2019-12-28 | Discharge: 2019-12-28 | Disposition: A | Discharge status: Home / Self Care | Payer: Medicaid Other | Source: Ambulatory Visit | Attending: Obstetrics & Gynecology | Admitting: Obstetrics & Gynecology

## 2019-12-28 DIAGNOSIS — Z01812 Encounter for preprocedural laboratory examination: Secondary | ICD-10-CM | POA: Diagnosis present

## 2019-12-28 DIAGNOSIS — Z20822 Contact with and (suspected) exposure to covid-19: Secondary | ICD-10-CM | POA: Insufficient documentation

## 2019-12-28 LAB — SARS CORONAVIRUS 2 (TAT 6-24 HRS): SARS Coronavirus 2: NEGATIVE

## 2019-12-30 ENCOUNTER — Encounter (HOSPITAL_BASED_OUTPATIENT_CLINIC_OR_DEPARTMENT_OTHER)
Admission: RE | Admit: 2019-12-30 | Discharge: 2019-12-30 | Disposition: A | Discharge status: Home / Self Care | Payer: Medicaid Other | Source: Ambulatory Visit | Attending: Obstetrics & Gynecology | Admitting: Obstetrics & Gynecology

## 2019-12-30 DIAGNOSIS — Z01812 Encounter for preprocedural laboratory examination: Secondary | ICD-10-CM | POA: Diagnosis present

## 2019-12-30 LAB — POCT PREGNANCY, URINE: Preg Test, Ur: NEGATIVE

## 2019-12-30 NOTE — Progress Notes (Signed)

## 2020-01-01 ENCOUNTER — Encounter (HOSPITAL_BASED_OUTPATIENT_CLINIC_OR_DEPARTMENT_OTHER): Payer: Self-pay | Admitting: Obstetrics & Gynecology

## 2020-01-01 ENCOUNTER — Ambulatory Visit (HOSPITAL_BASED_OUTPATIENT_CLINIC_OR_DEPARTMENT_OTHER): Payer: Medicaid Other | Admitting: Anesthesiology

## 2020-01-01 ENCOUNTER — Other Ambulatory Visit: Payer: Self-pay

## 2020-01-01 ENCOUNTER — Ambulatory Visit (HOSPITAL_BASED_OUTPATIENT_CLINIC_OR_DEPARTMENT_OTHER)
Admission: RE | Admit: 2020-01-01 | Discharge: 2020-01-01 | Disposition: A | Discharge status: Home / Self Care | Payer: Medicaid Other | Attending: Obstetrics & Gynecology | Admitting: Obstetrics & Gynecology

## 2020-01-01 ENCOUNTER — Encounter (HOSPITAL_BASED_OUTPATIENT_CLINIC_OR_DEPARTMENT_OTHER)
Admission: RE | Disposition: A | Discharge status: Home / Self Care | Payer: Self-pay | Source: Home / Self Care | Attending: Obstetrics & Gynecology

## 2020-01-01 DIAGNOSIS — F4310 Post-traumatic stress disorder, unspecified: Secondary | ICD-10-CM | POA: Insufficient documentation

## 2020-01-01 DIAGNOSIS — D649 Anemia, unspecified: Secondary | ICD-10-CM | POA: Insufficient documentation

## 2020-01-01 DIAGNOSIS — J45909 Unspecified asthma, uncomplicated: Secondary | ICD-10-CM | POA: Insufficient documentation

## 2020-01-01 DIAGNOSIS — Z91018 Allergy to other foods: Secondary | ICD-10-CM | POA: Insufficient documentation

## 2020-01-01 DIAGNOSIS — Z302 Encounter for sterilization: Secondary | ICD-10-CM

## 2020-01-01 DIAGNOSIS — F419 Anxiety disorder, unspecified: Secondary | ICD-10-CM | POA: Diagnosis not present

## 2020-01-01 DIAGNOSIS — Z86711 Personal history of pulmonary embolism: Secondary | ICD-10-CM | POA: Diagnosis not present

## 2020-01-01 DIAGNOSIS — Z87891 Personal history of nicotine dependence: Secondary | ICD-10-CM | POA: Insufficient documentation

## 2020-01-01 DIAGNOSIS — Z3009 Encounter for other general counseling and advice on contraception: Secondary | ICD-10-CM

## 2020-01-01 HISTORY — PX: LAPAROSCOPIC TUBAL LIGATION: SHX1937

## 2020-01-01 SURGERY — LIGATION, FALLOPIAN TUBE, LAPAROSCOPIC
Anesthesia: General | Site: Abdomen | Laterality: Bilateral

## 2020-01-01 MED ORDER — BUPIVACAINE HCL (PF) 0.25 % IJ SOLN
INTRAMUSCULAR | Status: AC
  Filled 2020-01-01: qty 30

## 2020-01-01 MED ORDER — SUGAMMADEX SODIUM 200 MG/2ML IV SOLN
INTRAVENOUS | Status: DC | PRN
  Administered 2020-01-01: 200 mg via INTRAVENOUS

## 2020-01-01 MED ORDER — ROCURONIUM BROMIDE 10 MG/ML (PF) SYRINGE
PREFILLED_SYRINGE | INTRAVENOUS | Status: DC | PRN
  Administered 2020-01-01: 50 mg via INTRAVENOUS

## 2020-01-01 MED ORDER — MIDAZOLAM HCL 2 MG/2ML IJ SOLN
INTRAMUSCULAR | Status: AC
  Filled 2020-01-01: qty 2

## 2020-01-01 MED ORDER — FENTANYL CITRATE (PF) 100 MCG/2ML IJ SOLN
INTRAMUSCULAR | Status: AC
  Filled 2020-01-01: qty 2

## 2020-01-01 MED ORDER — LACTATED RINGERS IV SOLN: INTRAVENOUS | Status: DC

## 2020-01-01 MED ORDER — ONDANSETRON HCL 4 MG/2ML IJ SOLN
INTRAMUSCULAR | Status: AC
  Filled 2020-01-01: qty 2

## 2020-01-01 MED ORDER — KETOROLAC TROMETHAMINE 30 MG/ML IJ SOLN
INTRAMUSCULAR | Status: DC | PRN
  Administered 2020-01-01: 30 mg via INTRAVENOUS

## 2020-01-01 MED ORDER — LIDOCAINE HCL (CARDIAC) PF 100 MG/5ML IV SOSY
PREFILLED_SYRINGE | INTRAVENOUS | Status: DC | PRN
  Administered 2020-01-01: 100 mg via INTRAVENOUS

## 2020-01-01 MED ORDER — ROCURONIUM BROMIDE 10 MG/ML (PF) SYRINGE
PREFILLED_SYRINGE | INTRAVENOUS | Status: AC
  Filled 2020-01-01: qty 10

## 2020-01-01 MED ORDER — BUPIVACAINE HCL (PF) 0.25 % IJ SOLN
INTRAMUSCULAR | Status: DC | PRN
  Administered 2020-01-01: 4 mL

## 2020-01-01 MED ORDER — LIDOCAINE 2% (20 MG/ML) 5 ML SYRINGE
INTRAMUSCULAR | Status: AC
  Filled 2020-01-01: qty 5

## 2020-01-01 MED ORDER — DIPHENHYDRAMINE HCL 50 MG/ML IJ SOLN
INTRAMUSCULAR | Status: DC | PRN
  Administered 2020-01-01: 25 mg via INTRAVENOUS

## 2020-01-01 MED ORDER — FENTANYL CITRATE (PF) 100 MCG/2ML IJ SOLN
INTRAMUSCULAR | Status: DC | PRN
  Administered 2020-01-01 (×2): 50 ug via INTRAVENOUS
  Administered 2020-01-01: 100 ug via INTRAVENOUS

## 2020-01-01 MED ORDER — ACETAMINOPHEN 10 MG/ML IV SOLN
INTRAVENOUS | Status: AC
  Filled 2020-01-01: qty 100

## 2020-01-01 MED ORDER — OXYCODONE HCL 5 MG PO TABS
ORAL_TABLET | ORAL | Status: AC
  Filled 2020-01-01: qty 1

## 2020-01-01 MED ORDER — KETOROLAC TROMETHAMINE 30 MG/ML IJ SOLN
INTRAMUSCULAR | Status: AC
  Filled 2020-01-01: qty 1

## 2020-01-01 MED ORDER — DEXAMETHASONE SODIUM PHOSPHATE 10 MG/ML IJ SOLN
INTRAMUSCULAR | Status: AC
  Filled 2020-01-01: qty 1

## 2020-01-01 MED ORDER — ONDANSETRON HCL 4 MG/2ML IJ SOLN
INTRAMUSCULAR | Status: DC | PRN
  Administered 2020-01-01: 4 mg via INTRAVENOUS

## 2020-01-01 MED ORDER — OXYCODONE HCL 5 MG PO TABS
5.0000 mg | ORAL_TABLET | Freq: Once | ORAL | Status: AC
  Administered 2020-01-01: 14:00:00 5 mg via ORAL

## 2020-01-01 MED ORDER — KETOROLAC TROMETHAMINE 30 MG/ML IJ SOLN: 30.0000 mg | Freq: Once | INTRAMUSCULAR | Status: DC | PRN

## 2020-01-01 MED ORDER — DIPHENHYDRAMINE HCL 50 MG/ML IJ SOLN
INTRAMUSCULAR | Status: AC
  Filled 2020-01-01: qty 1

## 2020-01-01 MED ORDER — FENTANYL CITRATE (PF) 100 MCG/2ML IJ SOLN
25.0000 ug | INTRAMUSCULAR | Status: DC | PRN
  Administered 2020-01-01 (×2): 50 ug via INTRAVENOUS

## 2020-01-01 MED ORDER — PROPOFOL 10 MG/ML IV BOLUS
INTRAVENOUS | Status: AC
  Filled 2020-01-01: qty 20

## 2020-01-01 MED ORDER — ACETAMINOPHEN 10 MG/ML IV SOLN
1000.0000 mg | Freq: Four times a day (QID) | INTRAVENOUS | Status: DC
  Administered 2020-01-01: 1000 mg via INTRAVENOUS

## 2020-01-01 MED ORDER — OXYCODONE-ACETAMINOPHEN 5-325 MG PO TABS: 1.0000 | ORAL_TABLET | Freq: Four times a day (QID) | ORAL | 0 refills | Status: DC | PRN

## 2020-01-01 MED ORDER — DEXAMETHASONE SODIUM PHOSPHATE 10 MG/ML IJ SOLN
INTRAMUSCULAR | Status: DC | PRN
  Administered 2020-01-01: 5 mg via INTRAVENOUS

## 2020-01-01 MED ORDER — PROMETHAZINE HCL 25 MG/ML IJ SOLN: 6.2500 mg | INTRAMUSCULAR | Status: DC | PRN

## 2020-01-01 MED ORDER — MIDAZOLAM HCL 2 MG/2ML IJ SOLN
INTRAMUSCULAR | Status: DC | PRN
  Administered 2020-01-01: 2 mg via INTRAVENOUS

## 2020-01-01 MED ORDER — PROPOFOL 10 MG/ML IV BOLUS
INTRAVENOUS | Status: DC | PRN
  Administered 2020-01-01: 200 mg via INTRAVENOUS

## 2020-01-01 SURGICAL SUPPLY — 29 items
ADH SKN CLS APL DERMABOND .7 (GAUZE/BANDAGES/DRESSINGS) ×1
CATH ROBINSON RED A/P 16FR (CATHETERS) ×1 IMPLANT
CLIP FILSHIE TUBAL LIGA STRL (Clip) ×1 IMPLANT
COVER MAYO STAND STRL (DRAPES) IMPLANT
DERMABOND ADVANCED (GAUZE/BANDAGES/DRESSINGS) ×1
DERMABOND ADVANCED .7 DNX12 (GAUZE/BANDAGES/DRESSINGS) ×1 IMPLANT
DRSG OPSITE POSTOP 3X4 (GAUZE/BANDAGES/DRESSINGS) ×1 IMPLANT
DURAPREP 26ML APPLICATOR (WOUND CARE) ×2 IMPLANT
GAUZE 4X4 16PLY RFD (DISPOSABLE) ×2 IMPLANT
GLOVE BIO SURGEON STRL SZ 6.5 (GLOVE) ×2 IMPLANT
GLOVE BIOGEL PI IND STRL 7.0 (GLOVE) ×4 IMPLANT
GLOVE BIOGEL PI INDICATOR 7.0 (GLOVE) ×4
GOWN STRL REUS W/TWL LRG LVL3 (GOWN DISPOSABLE) ×4 IMPLANT
NEEDLE INSUFFLATION 120MM (ENDOMECHANICALS) ×2 IMPLANT
PACK LAPAROSCOPY BASIN (CUSTOM PROCEDURE TRAY) ×2 IMPLANT
PACK TRENDGUARD 450 HYBRID PRO (MISCELLANEOUS) IMPLANT
PACK TRENDGUARD 600 HYBRD PROC (MISCELLANEOUS) IMPLANT
PAD ARMBOARD 7.5X6 YLW CONV (MISCELLANEOUS) ×4 IMPLANT
PAD OB MATERNITY 4.3X12.25 (PERSONAL CARE ITEMS) ×2 IMPLANT
PAD PREP 24X48 CUFFED NSTRL (MISCELLANEOUS) ×2 IMPLANT
SET TUBE SMOKE EVAC HIGH FLOW (TUBING) ×2 IMPLANT
SLEEVE SCD COMPRESS KNEE MED (MISCELLANEOUS) ×4 IMPLANT
SUT VICRYL 0 UR6 27IN ABS (SUTURE) ×2 IMPLANT
SUT VICRYL 4-0 PS2 18IN ABS (SUTURE) ×2 IMPLANT
TOWEL GREEN STERILE FF (TOWEL DISPOSABLE) ×4 IMPLANT
TRENDGUARD 450 HYBRID PRO PACK (MISCELLANEOUS) ×2
TRENDGUARD 600 HYBRID PROC PK (MISCELLANEOUS)
TROCAR XCEL DIL TIP R 11M (ENDOMECHANICALS) ×2 IMPLANT
WARMER LAPAROSCOPE (MISCELLANEOUS) ×2 IMPLANT

## 2020-01-01 NOTE — Op Note (Signed)
Shabnam Shillingford 01/01/2020  PREOPERATIVE DIAGNOSIS:  Undesired fertility  POSTOPERATIVE DIAGNOSIS:  Undesired fertility  PROCEDURE:  Laparoscopic Bilateral Tubal Sterilization using Filshie Clips   SURGEON: Woodroe Mode, MD   ANESTHESIA:  General endotracheal  COMPLICATIONS:  None immediate.  ESTIMATED BLOOD LOSS:  Less than 20 ml.  FLUIDS: 1000 ml LR.  URINE OUTPUT:  75 ml of clear urine.  INDICATIONS: 34 y.o. [id number]  with undesired fertility, desires permanent sterilization. Other reversible forms of contraception were discussed with patient; she declines all other modalities.  Risks of procedure discussed with patient including permanence of method, bleeding, infection, injury to surrounding organs and need for additional procedures including laparotomy, risk of regret.  Failure risk of 0.5-1% with increased risk of ectopic gestation if pregnancy occurs was also discussed with patient.      FINDINGS:  Normal uterus, tubes, and ovaries.  TECHNIQUE:  The patient was taken to the operating room where general anesthesia was obtained without difficulty.  She was then placed in the dorsal lithotomy position and prepared and draped in sterile fashion.  After an adequate timeout was performed, a bivalved speculum was then placed in the patient's vagina, and the anterior lip of cervix grasped with the single-tooth tenaculum.  The uterine manipulator was then advanced into the uterus.  The speculum was removed from the vagina.  Attention was then turned to the patient's abdomen where a 11-mm skin incision was made in the umbilical fold. Veress needle was placed and the abdomen was then insufflated with carbon dioxide gas and adequate pneumoperitoneum was obtained.The 11-mm trocar and sleeve were then advanced without difficulty into the abdomen.    A survey of the patient's pelvis and abdomen revealed very minor adhesion in the right adnexa.  The fallopian tubes were observed and found  to be normal in appearance. The Filshie clip applicator was placed through the operative port, and a Filshie clip was placed on the right fallopian tube ,about 2 cm from the cornual attachment, with care given to incorporate the underlying mesosalpinx.  A similar process was carried out on the contralateral side allowing for bilateral tubal sterilization.   Good hemostasis was noted overall. The instruments were then removed from the patient's abdomen and the fascial incision was repaired with 0 Vicryl, and the skin was closed with 4-0 Vicryl and Dermabond.  The uterine manipulator and the tenaculum were removed from the vagina without complications. The patient tolerated the procedure well.  Sponge, lap, and needle counts were correct times two.  The patient was then taken to the recovery room awake, extubated and in stable condition.  Woodroe Mode, MD 01/01/2020 11:17 AM

## 2020-01-01 NOTE — Interval H&P Note (Signed)
History and Physical Interval Note:  01/01/2020 10:17 AM  Meghan Reed  has presented today for surgery, with the diagnosis of Undesired Fertility.  The various methods of treatment have been discussed with the patient and family. After consideration of risks, benefits and other options for treatment, the patient has consented to  Procedure(s): LAPAROSCOPIC TUBAL LIGATION (Bilateral) as a surgical intervention.  The patient's history has been reviewed, patient examined, no change in status, stable for surgery.  I have reviewed the patient's chart and labs.  Questions were answered to the patient's satisfaction.     [name]

## 2020-01-01 NOTE — Anesthesia Postprocedure Evaluation (Signed)
Anesthesia Post Note  Patient: Meghan Reed  Procedure(s) Performed: LAPAROSCOPIC TUBAL LIGATION (Bilateral Abdomen)     Patient location during evaluation: PACU Anesthesia Type: General Level of consciousness: awake and alert Pain management: pain level controlled Vital Signs Assessment: post-procedure vital signs reviewed and stable Respiratory status: spontaneous breathing, nonlabored ventilation, respiratory function stable and patient connected to nasal cannula oxygen Cardiovascular status: blood pressure returned to baseline and stable Postop Assessment: no apparent nausea or vomiting Anesthetic complications: no    Last Vitals:  Vitals:   01/01/20 1145 01/01/20 1200  BP: (!) 134/99 (!) 139/98  Pulse: 92 86  Resp: 16 (!) 23  Temp:    SpO2: 100% 100%    Last Pain:  Vitals:   01/01/20 1200  PainSc: 8                  Renita Brocks S

## 2020-01-01 NOTE — Anesthesia Procedure Notes (Signed)
Procedure Name: Intubation Date/Time: 01/01/2020 10:43 AM Performed by: Raenette Rover, CRNA Pre-anesthesia Checklist: Patient identified, Emergency Drugs available, Suction available and Patient being monitored Patient Re-evaluated:Patient Re-evaluated prior to induction Oxygen Delivery Method: Circle system utilized Preoxygenation: Pre-oxygenation with 100% oxygen Induction Type: IV induction Ventilation: Mask ventilation without difficulty Laryngoscope Size: Mac and 3 Grade View: Grade I Tube type: Oral Tube size: 7.0 mm Number of attempts: 1 Airway Equipment and Method: Stylet Placement Confirmation: ETT inserted through vocal cords under direct vision,  positive ETCO2 and breath sounds checked- equal and bilateral Secured at: 21 cm Tube secured with: Tape Dental Injury: Teeth and Oropharynx as per pre-operative assessment

## 2020-01-01 NOTE — Anesthesia Preprocedure Evaluation (Signed)
Anesthesia Evaluation  Patient identified by MRN, date of birth, ID band Patient awake    Reviewed: Allergy & Precautions, NPO status , Patient's Chart, lab work & pertinent test results  Airway Mallampati: II  TM Distance: >3 FB Neck ROM: Full    Dental no notable dental hx.    Pulmonary asthma , former smoker,    Pulmonary exam normal breath sounds clear to auscultation       Cardiovascular negative cardio ROS Normal cardiovascular exam Rhythm:Regular Rate:Normal     Neuro/Psych Anxiety negative neurological ROS     GI/Hepatic negative GI ROS, Neg liver ROS,   Endo/Other  negative endocrine ROS  Renal/GU negative Renal ROS  negative genitourinary   Musculoskeletal negative musculoskeletal ROS (+)   Abdominal   Peds negative pediatric ROS (+)  Hematology negative hematology ROS (+)   Anesthesia Other Findings   Reproductive/Obstetrics negative OB ROS                             Anesthesia Physical Anesthesia Plan  ASA: II  Anesthesia Plan: General   Post-op Pain Management:    Induction: Intravenous  PONV Risk Score and Plan: 3 and Ondansetron, Dexamethasone and Treatment may vary due to age or medical condition  Airway Management Planned: Oral ETT  Additional Equipment:   Intra-op Plan:   Post-operative Plan: Extubation in OR  Informed Consent: I have reviewed the patients History and Physical, chart, labs and discussed the procedure including the risks, benefits and alternatives for the proposed anesthesia with the patient or authorized representative who has indicated his/her understanding and acceptance.     Dental advisory given  Plan Discussed with: CRNA and Surgeon  Anesthesia Plan Comments:         Anesthesia Quick Evaluation

## 2020-01-01 NOTE — Discharge Instructions (Signed)
Laparoscopic Tubal Ligation, Care After This sheet gives you information about how to care for yourself after your procedure. Your health care provider may also give you more specific instructions. If you have problems or questions, contact your health care provider. What can I expect after the procedure? After the procedure, it is common to have:  A sore throat.  Discomfort in your shoulder.  Mild discomfort or cramping in your abdomen.  Gas pains.  Pain or soreness in the area where the surgical incision was made.  A bloated feeling.  Tiredness.  Nausea.  Vomiting. Follow these instructions at home: Medicines  Take over-the-counter and prescription medicines only as told by your health care provider.  Do not take aspirin because it can cause bleeding.  Ask your health care provider if the medicine prescribed to you: ? Requires you to avoid driving or using heavy machinery. ? Can cause constipation. You may need to take actions to prevent or treat constipation, such as:  Drink enough fluid to keep your urine pale yellow.  Take over-the-counter or prescription medicines.  Eat foods that are high in fiber, such as beans, whole grains, and fresh fruits and vegetables.  Limit foods that are high in fat and processed sugars, such as fried or sweet foods. Incision care      Follow instructions from your health care provider about how to take care of your incision. Make sure you: ? Wash your hands with soap and water before and after you change your bandage (dressing). If soap and water are not available, use hand sanitizer. ? Change your dressing as told by your health care provider. ? Leave stitches (sutures), skin glue, or adhesive strips in place. These skin closures may need to stay in place for 2 weeks or longer. If adhesive strip edges start to loosen and curl up, you may trim the loose edges. Do not remove adhesive strips completely unless your health care provider  tells you to do that.  Check your incision area every day for signs of infection. Check for: ? Redness, swelling, or pain. ? Fluid or blood. ? Warmth. ? Pus or a bad smell. Activity  Rest as told by your health care provider.  Avoid sitting for a long time without moving. Get up to take short walks every 1-2 hours. This is important to improve blood flow and breathing. Ask for help if you feel weak or unsteady.  Return to your normal activities as told by your health care provider. Ask your health care provider what activities are safe for you. General instructions  Do not take baths, swim, or use a hot tub until your health care provider approves. Ask your health care provider if you may take showers. You may only be allowed to take sponge baths.  Have someone help you with your daily household tasks for the first few days.  Keep all follow-up visits as told by your health care provider. This is important. Contact a health care provider if:  You have redness, swelling, or pain around your incision.  Your incision feels warm to the touch.  You have pus or a bad smell coming from your incision.  The edges of your incision break open after the sutures have been removed.  Your pain does not improve after 2-3 days.  You have a rash.  You repeatedly become dizzy or light-headed.  Your pain medicine is not helping. Get help right away if you:  Have a fever.  Faint.  Have increasing  pain in your abdomen.  Have severe pain in one or both of your shoulders.  Have fluid or blood coming from your sutures or from your vagina.  Have shortness of breath or difficulty breathing.  Have chest pain or leg pain.  Have ongoing nausea, vomiting, or diarrhea. Summary  After the procedure, it is common to have mild discomfort or cramping in your abdomen.  Take over-the-counter and prescription medicines only as told by your health care provider.  Watch for symptoms that should  prompt you to call your health care provider.  Keep all follow-up visits as told by your health care provider. This is important. This information is not intended to replace advice given to you by your health care provider. Make sure you discuss any questions you have with your health care provider. Document Revised: 03/26/2019 Document Reviewed: 09/11/2018 Elsevier Patient Education  2020 [place] take [place]yl[place] after 6:45pm *May take Ibuprofen after 6:45pm Oxycodone given at [place] Instructions  Activity: Get plenty of rest for the remainder of the day. A responsible individual must stay with you for 24 hours following the procedure.  For the next 24 hours, DO NOT: -Drive a car -Paediatric nurse -Drink alcoholic beverages -Take any medication unless instructed by your physician -Make any legal decisions or sign important papers.  Meals: Start with liquid foods such as gelatin or soup. Progress to regular foods as tolerated. Avoid greasy, spicy, heavy foods. If nausea and/or vomiting occur, drink only clear liquids until the nausea and/or vomiting subsides. Call your physician if vomiting continues.  Special Instructions/Symptoms: Your throat may feel dry or sore from the anesthesia or the breathing tube placed in your throat during surgery. If this causes discomfort, gargle with warm salt water. The discomfort should disappear within 24 hours.  If you had a scopolamine patch placed behind your ear for the management of post- operative nausea and/or vomiting:  1. The medication in the patch is effective for 72 hours, after which it should be removed.  Wrap patch in a tissue and discard in the trash. Wash hands thoroughly with soap and water. 2. You may remove the patch earlier than 72 hours if you experience unpleasant side effects which may include dry mouth, dizziness or visual disturbances. 3. Avoid touching the patch. Wash your hands with soap  and water after contact with the patch.

## 2020-01-01 NOTE — Transfer of Care (Signed)
Immediate Anesthesia Transfer of Care Note  Patient: Meghan Reed  Procedure(s) Performed: LAPAROSCOPIC TUBAL LIGATION (Bilateral Abdomen)  Patient Location: PACU  Anesthesia Type:General  Level of Consciousness: awake, alert , oriented, drowsy and patient cooperative  Airway & Oxygen Therapy: Patient Spontanous Breathing and Patient connected to face mask oxygen  Post-op Assessment: Report given to RN and Post -op Vital signs reviewed and stable  Post vital signs: Reviewed and stable  Last Vitals:  Vitals Value Taken Time  BP 127/74 01/01/20 1131  Temp    Pulse 98 01/01/20 1133  Resp 17 01/01/20 1133  SpO2 100 % 01/01/20 1133  Vitals shown include unvalidated device data.  Last Pain:  Vitals:   01/01/20 0944  PainSc: 0-No pain         Complications: No apparent anesthesia complications

## 2020-01-16 ENCOUNTER — Telehealth: Payer: Self-pay | Admitting: Obstetrics and Gynecology

## 2020-01-16 NOTE — Telephone Encounter (Signed)
The patient stated she was told in the ER to schedule an appointment in 3 weeks. She stated the surgery site is hard, hurts. She also said she has already had a period but now she is spotting and feels the she may have a UTI. She will not be able to come into office After Monday due to a start in her new job. The appointment was scheduled for the next available.

## 2020-01-18 ENCOUNTER — Emergency Department (HOSPITAL_COMMUNITY)
Admission: EM | Admit: 2020-01-18 | Discharge: 2020-01-19 | Disposition: A | Discharge status: Home / Self Care | Payer: Medicaid Other | Attending: Emergency Medicine | Admitting: Emergency Medicine

## 2020-01-18 ENCOUNTER — Other Ambulatory Visit: Payer: Self-pay

## 2020-01-18 ENCOUNTER — Encounter (HOSPITAL_COMMUNITY): Payer: Self-pay | Admitting: Emergency Medicine

## 2020-01-18 DIAGNOSIS — J45909 Unspecified asthma, uncomplicated: Secondary | ICD-10-CM | POA: Diagnosis not present

## 2020-01-18 DIAGNOSIS — Z87891 Personal history of nicotine dependence: Secondary | ICD-10-CM | POA: Insufficient documentation

## 2020-01-18 DIAGNOSIS — N938 Other specified abnormal uterine and vaginal bleeding: Secondary | ICD-10-CM

## 2020-01-18 DIAGNOSIS — Z79899 Other long term (current) drug therapy: Secondary | ICD-10-CM | POA: Diagnosis not present

## 2020-01-18 DIAGNOSIS — N939 Abnormal uterine and vaginal bleeding, unspecified: Secondary | ICD-10-CM | POA: Insufficient documentation

## 2020-01-18 DIAGNOSIS — R42 Dizziness and giddiness: Secondary | ICD-10-CM | POA: Insufficient documentation

## 2020-01-18 DIAGNOSIS — Z9889 Other specified postprocedural states: Secondary | ICD-10-CM | POA: Diagnosis not present

## 2020-01-18 LAB — I-STAT BETA HCG BLOOD, ED (MC, WL, AP ONLY): I-stat hCG, quantitative: <5 m[IU]/mL (ref <5)

## 2020-01-18 LAB — HEMOGLOBIN AND HEMATOCRIT, BLOOD
HCT: 36.9 % (ref 36.0–46.0)
Hemoglobin: 12.3 g/dL (ref 12.0–15.0)

## 2020-01-18 MED ORDER — SODIUM CHLORIDE 0.9 % IV BOLUS
1000.0000 mL | Freq: Once | INTRAVENOUS | Status: AC
  Administered 2020-01-18: 1000 mL via INTRAVENOUS
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
## 2020-01-18 NOTE — ED Triage Notes (Signed)
Pt arrived via EMS. Pt started having light vaginal bleeding 2 days ago. Bleeding worsened today, pt states that she has been through 4 overnight pads today. Pt's home BP was 181/90, and she states that she has been dizzy and has neck pain. Pt had a tubal ligation done earlier in March [id number] and an uncomplicated delivery of a child in December 2020, but then needed several blood transfusions after hemorrhaging. Pt had a PE in October of 2020. Pt is not currently on a blood thinner. Pt takes iron supplements at home.

## 2020-01-19 MED ORDER — ONDANSETRON 4 MG PO TBDP
4.0000 mg | ORAL_TABLET | Freq: Once | ORAL | Status: AC
  Administered 2020-01-19: 4 mg via ORAL
  Filled 2020-01-19: qty 1

## 2020-01-19 NOTE — ED Notes (Signed)
Pt ambulated to the bathroom.  

## 2020-01-19 NOTE — Discharge Instructions (Addendum)
You were seen today for vaginal bleeding.  Your hemoglobin is stable.  Your urine pregnancy test is negative.  Make sure to stay hydrated.  Keep a close eye on her bleeding.  If bleeding increases or you begin to soak through multiple pads over several hours, you should be reevaluated.

## 2020-01-19 NOTE — ED Notes (Signed)
Pt had some complaints of lightheadedness and nausea, more so when standing

## 2020-01-22 ENCOUNTER — Encounter: Payer: Self-pay | Admitting: Family Medicine

## 2020-01-22 ENCOUNTER — Other Ambulatory Visit: Payer: Self-pay

## 2020-01-22 ENCOUNTER — Ambulatory Visit (INDEPENDENT_AMBULATORY_CARE_PROVIDER_SITE_OTHER): Payer: Medicaid Other | Admitting: Family Medicine

## 2020-01-22 VITALS — BP 112/81 | HR 87 | Ht 66.0 in | Wt 203.0 lb

## 2020-01-22 DIAGNOSIS — Z09 Encounter for follow-up examination after completed treatment for conditions other than malignant neoplasm: Secondary | ICD-10-CM

## 2020-01-22 DIAGNOSIS — I10 Essential (primary) hypertension: Secondary | ICD-10-CM | POA: Diagnosis present

## 2020-01-22 DIAGNOSIS — R350 Frequency of micturition: Secondary | ICD-10-CM | POA: Diagnosis not present

## 2020-01-22 LAB — POCT URINALYSIS DIP (DEVICE)
Bilirubin Urine: NEGATIVE
Glucose, UA: NEGATIVE mg/dL
Ketones, ur: NEGATIVE mg/dL
Leukocytes,Ua: NEGATIVE
Nitrite: NEGATIVE
Protein, ur: NEGATIVE mg/dL
Specific Gravity, Urine: 1.015 (ref 1.005–1.030)
Urobilinogen, UA: 0.2 mg/dL (ref 0.0–1.0)
pH: 5.5 (ref 5.0–8.0)

## 2020-01-22 NOTE — Assessment & Plan Note (Signed)
Low salt diet, sit before standing, stand before walking. Stay well hydrated.. see PCP. Check BP at home periodically. Warning signs to go to ED.

## 2020-01-22 NOTE — Progress Notes (Signed)
   Subjective:    Patient ID: Meghan Reed is a 34 y.o. female presenting with Routine Post Op  on 01/22/2020  HPI: Notes her BP is up- to 180/100 at home-has had headache--Called EMS and seen in ED-BP WNL since arriving at ED and here today. Still with Dizziness with standing. Has appt. With PCP in late April. Also had bleeding form 3/3-3/9--then had another cycle after that. Reports urinary frequency. Denies dysuria, fever. + flank pain.  Review of Systems  Constitutional: Negative for chills and fever.  Respiratory: Negative for shortness of breath.   Cardiovascular: Negative for chest pain.  Gastrointestinal: Negative for abdominal pain, nausea and vomiting.  Genitourinary: Negative for dysuria.  Skin: Negative for rash.      Objective:    BP 112/81   Pulse 87   Ht 5\' 6"  (1.676 m)   Wt 203 lb (92.1 kg)   LMP 01/16/2020 (Exact Date)   Breastfeeding No   BMI 32.77 kg/m  Physical Exam Constitutional:      General: She is not in acute distress.    Appearance: She is well-developed.  HENT:     Head: Normocephalic and atraumatic.  Eyes:     General: No scleral icterus. Cardiovascular:     Rate and Rhythm: Normal rate.  Pulmonary:     Effort: Pulmonary effort is normal.  Abdominal:     Palpations: Abdomen is soft.  Musculoskeletal:     Cervical back: Neck supple.  Skin:    General: Skin is warm and dry.  Neurological:     Mental Status: She is alert and oriented to person, place, and time.    Urinalysis    Component Value Date/Time   COLORURINE STRAW (A) 08/25/2019 1150   APPEARANCEUR CLEAR 08/25/2019 1150   APPEARANCEUR Clear 08/12/2019 1122   LABSPEC 1.015 01/22/2020 1138   PHURINE 5.5 01/22/2020 1138   GLUCOSEU NEGATIVE 01/22/2020 1138   HGBUR TRACE (A) 01/22/2020 1138   BILIRUBINUR NEGATIVE 01/22/2020 1138   BILIRUBINUR Negative 08/12/2019 [place] 01/22/2020 1138   PROTEINUR NEGATIVE 01/22/2020 1138   UROBILINOGEN 0.2  01/22/2020 1138   NITRITE NEGATIVE 01/22/2020 [place] 01/22/2020 1138         Assessment & Plan:   Problem List Items Addressed This Visit      Unprioritized   Intermittent hypertension    Low salt diet, sit before standing, stand before walking. Stay well hydrated.. see PCP. Check BP at home periodically. Warning signs to go to ED.       Other Visit Diagnoses    Postop check    -  Primary   well healed incision, doing well.   Urinary frequency       Relevant Orders   POCT urinalysis dip (device) (Completed)      Total time: 20 minutes.  Return in about 6 weeks (around 03/04/2020) for repeat pap.  Donnamae Jude 01/22/2020 1:24 PM

## 2020-01-22 NOTE — Progress Notes (Signed)
Patient reports frequent urination & flank pain for past week

## 2020-02-06 ENCOUNTER — Other Ambulatory Visit: Payer: Medicaid Other

## 2020-02-06 ENCOUNTER — Ambulatory Visit: Payer: Medicaid Other | Admitting: Hematology and Oncology

## 2020-02-16 ENCOUNTER — Other Ambulatory Visit: Payer: Self-pay | Admitting: Hematology and Oncology

## 2020-02-16 DIAGNOSIS — D500 Iron deficiency anemia secondary to blood loss (chronic): Secondary | ICD-10-CM

## 2020-02-17 ENCOUNTER — Other Ambulatory Visit: Payer: Self-pay

## 2020-02-17 ENCOUNTER — Inpatient Hospital Stay (HOSPITAL_BASED_OUTPATIENT_CLINIC_OR_DEPARTMENT_OTHER): Payer: Medicaid Other | Admitting: Hematology and Oncology

## 2020-02-17 ENCOUNTER — Encounter: Payer: Self-pay | Admitting: Hematology and Oncology

## 2020-02-17 ENCOUNTER — Inpatient Hospital Stay: Payer: Medicaid Other | Attending: Hematology and Oncology

## 2020-02-17 VITALS — BP 115/90 | HR 98 | Temp 98.3°F | Resp 20 | Ht 66.0 in | Wt 209.8 lb

## 2020-02-17 DIAGNOSIS — Z90710 Acquired absence of both cervix and uterus: Secondary | ICD-10-CM | POA: Insufficient documentation

## 2020-02-17 DIAGNOSIS — M7122 Synovial cyst of popliteal space [Baker], left knee: Secondary | ICD-10-CM | POA: Insufficient documentation

## 2020-02-17 DIAGNOSIS — Z86711 Personal history of pulmonary embolism: Secondary | ICD-10-CM | POA: Diagnosis not present

## 2020-02-17 DIAGNOSIS — D500 Iron deficiency anemia secondary to blood loss (chronic): Secondary | ICD-10-CM | POA: Diagnosis not present

## 2020-02-17 LAB — CBC WITH DIFFERENTIAL (CANCER CENTER ONLY)
Abs Immature Granulocytes: 0.03 10*3/uL (ref 0.00–0.07)
Basophils Absolute: 0 10*3/uL (ref 0.0–0.1)
Basophils Relative: 1 %
Eosinophils Absolute: 0.2 10*3/uL (ref 0.0–0.5)
Eosinophils Relative: 3 %
HCT: 37.6 % (ref 36.0–46.0)
Hemoglobin: 12.6 g/dL (ref 12.0–15.0)
Immature Granulocytes: 0 %
Lymphocytes Relative: 35 %
Lymphs Abs: 2.4 10*3/uL (ref 0.7–4.0)
MCH: 29.2 pg (ref 26.0–34.0)
MCHC: 33.5 g/dL (ref 30.0–36.0)
MCV: 87.2 fL (ref 80.0–100.0)
Monocytes Absolute: 0.5 10*3/uL (ref 0.1–1.0)
Monocytes Relative: 7 %
Neutro Abs: 3.7 10*3/uL (ref 1.7–7.7)
Neutrophils Relative %: 54 %
Platelet Count: 273 10*3/uL (ref 150–400)
RBC: 4.31 MIL/uL (ref 3.87–5.11)
RDW: 14 % (ref 11.5–15.5)
WBC Count: 6.8 10*3/uL (ref 4.0–10.5)
nRBC: 0 % (ref 0.0–0.2)

## 2020-02-17 LAB — CMP (CANCER CENTER ONLY)
ALT: 22 U/L (ref 0–44)
AST: 17 U/L (ref 15–41)
Albumin: 3.9 g/dL (ref 3.5–5.0)
Alkaline Phosphatase: 67 U/L (ref 38–126)
Anion gap: 7 (ref 5–15)
BUN: 10 mg/dL (ref 6–20)
CO2: 28 mmol/L (ref 22–32)
Calcium: 9.1 mg/dL (ref 8.9–10.3)
Chloride: 105 mmol/L (ref 98–111)
Creatinine: 0.81 mg/dL (ref 0.44–1.00)
GFR, Est AFR Am: >60 mL/min (ref >60)
GFR, Estimated: >60 mL/min (ref >60)
Glucose, Bld: 84 mg/dL (ref 70–99)
Potassium: 4.2 mmol/L (ref 3.5–5.1)
Sodium: 140 mmol/L (ref 135–145)
Total Bilirubin: 0.2 mg/dL — ABNORMAL LOW (ref 0.3–1.2)
Total Protein: 7.2 g/dL (ref 6.5–8.1)

## 2020-02-17 LAB — IRON AND TIBC
Iron: 52 ug/dL (ref 41–142)
Saturation Ratios: 15 % — ABNORMAL LOW (ref 21–57)
TIBC: 344 ug/dL (ref 236–444)
UIBC: 292 ug/dL (ref 120–384)

## 2020-02-17 LAB — FERRITIN: Ferritin: 27 ng/mL (ref 11–307)

## 2020-02-17 LAB — RETIC PANEL
Immature Retic Fract: 9 % (ref 2.3–15.9)
RBC.: 4.38 MIL/uL (ref 3.87–5.11)
Retic Count, Absolute: 71.4 10*3/uL (ref 19.0–186.0)
Retic Ct Pct: 1.6 % (ref 0.4–3.1)
Reticulocyte Hemoglobin: 35 pg (ref >27.9)
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
## 2020-02-19 ENCOUNTER — Other Ambulatory Visit: Payer: Self-pay | Admitting: Internal Medicine

## 2020-02-19 ENCOUNTER — Telehealth: Payer: Self-pay

## 2020-02-19 DIAGNOSIS — L03032 Cellulitis of left toe: Secondary | ICD-10-CM | POA: Insufficient documentation

## 2020-02-19 MED ORDER — SULFAMETHOXAZOLE-TRIMETHOPRIM 800-160 MG PO TABS: 2.0000 | ORAL_TABLET | Freq: Two times a day (BID) | ORAL | 0 refills | Status: AC

## 2020-02-19 NOTE — Telephone Encounter (Signed)
Pt has experienced pain of left great toe x 3 days. Area is red, swollen and tender to touch. Looks like paronychia

## 2020-02-21 ENCOUNTER — Other Ambulatory Visit: Payer: Self-pay

## 2020-02-21 ENCOUNTER — Encounter: Payer: Self-pay | Admitting: Orthopaedic Surgery

## 2020-02-21 ENCOUNTER — Ambulatory Visit: Payer: Self-pay

## 2020-02-21 ENCOUNTER — Ambulatory Visit (INDEPENDENT_AMBULATORY_CARE_PROVIDER_SITE_OTHER): Payer: Medicaid Other | Admitting: Orthopaedic Surgery

## 2020-02-21 VITALS — Ht 66.0 in | Wt 207.0 lb

## 2020-02-21 DIAGNOSIS — G8929 Other chronic pain: Secondary | ICD-10-CM | POA: Diagnosis not present

## 2020-02-21 DIAGNOSIS — M25562 Pain in left knee: Secondary | ICD-10-CM
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
## 2020-02-21 NOTE — Addendum Note (Signed)
Addended by: Precious Bard on: 02/21/2020 10:45 AM   Modules accepted: Orders

## 2020-02-24 ENCOUNTER — Telehealth: Payer: Self-pay | Admitting: *Deleted

## 2020-02-24 NOTE — Telephone Encounter (Signed)
-----   Message from Orson Slick, MD sent at 02/24/2020 12:43 PM EDT ----- Please call Meghan Reed to let her know her Hgb and RBC count looks good, but that her total body iron levels are still low. Encourage her to continue taking PO iron 325mg  with a source of vitamin C.  She does not require any further f/u in our clinic, her iron levels can be checked by her PCP.  [name]  ----- Message ----- From: Buel Ream, [facility] In [place] Sent: 02/17/2020   2:32 PM EDT To: Orson Slick, MD

## 2020-02-24 NOTE — Telephone Encounter (Signed)
-----   Message from Orson Slick, MD sent at 02/24/2020 12:43 PM EDT ----- Please call Mrs. Scruggs-Hernandez to let her know her Hgb and RBC count looks good, but that her total body iron levels are still low. Encourage her to continue taking PO iron 325mg  with a source of vitamin C.  She does not require any further f/u in our clinic, her iron levels can be checked by her PCP.  [name]  ----- Message ----- From: Buel Ream, Lab In [place] Sent: 02/17/2020   2:32 PM EDT To: Orson Slick, MD

## 2020-02-24 NOTE — Telephone Encounter (Signed)
TCT patient regarding lab results from 02/17/20. Spoke with patient and advised that her HGB and RBCs are good but that her iron levels remain low.  Per Dr. Lorenso Courier, she is to continue her oral iron supplements along with Vitamin C/orange juice. Also advised that she can get her iron levels checked with her PCP and does not need to follow up in this clinic.  Pt voiced understanding. No further questions or concerns.

## 2020-03-03 ENCOUNTER — Encounter: Payer: Self-pay | Admitting: *Deleted

## 2020-03-04 ENCOUNTER — Ambulatory Visit (INDEPENDENT_AMBULATORY_CARE_PROVIDER_SITE_OTHER): Payer: Medicaid Other | Admitting: Advanced Practice Midwife

## 2020-03-04 ENCOUNTER — Ambulatory Visit: Payer: Medicaid Other | Admitting: Orthopaedic Surgery

## 2020-03-04 ENCOUNTER — Other Ambulatory Visit (HOSPITAL_COMMUNITY)
Admission: RE | Admit: 2020-03-04 | Discharge: 2020-03-04 | Disposition: A | Discharge status: Home / Self Care | Payer: Medicaid Other | Source: Ambulatory Visit | Attending: Advanced Practice Midwife | Admitting: Advanced Practice Midwife

## 2020-03-04 ENCOUNTER — Other Ambulatory Visit: Payer: Self-pay

## 2020-03-04 ENCOUNTER — Encounter: Payer: Self-pay | Admitting: Advanced Practice Midwife

## 2020-03-04 VITALS — BP 107/76 | HR 84 | Ht 66.5 in | Wt 207.1 lb

## 2020-03-04 DIAGNOSIS — Z9851 Tubal ligation status: Secondary | ICD-10-CM

## 2020-03-04 DIAGNOSIS — N939 Abnormal uterine and vaginal bleeding, unspecified: Secondary | ICD-10-CM

## 2020-03-04 DIAGNOSIS — N898 Other specified noninflammatory disorders of vagina: Secondary | ICD-10-CM | POA: Diagnosis not present

## 2020-03-04 DIAGNOSIS — R87618 Other abnormal cytological findings on specimens from cervix uteri: Secondary | ICD-10-CM | POA: Diagnosis present

## 2020-03-04 DIAGNOSIS — Z113 Encounter for screening for infections with a predominantly sexual mode of transmission: Secondary | ICD-10-CM | POA: Diagnosis present

## 2020-03-04 DIAGNOSIS — R8789 Other abnormal findings in specimens from female genital organs: Secondary | ICD-10-CM | POA: Diagnosis not present

## 2020-03-04 DIAGNOSIS — T8332XA Displacement of intrauterine contraceptive device, initial encounter: Secondary | ICD-10-CM | POA: Insufficient documentation
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
## 2020-03-04 NOTE — Patient Instructions (Signed)
Preventive Care 21-35 Years Old, Female Preventive care refers to visits with your health care provider and lifestyle choices that can promote health and wellness. This includes:  A yearly physical exam. This may also be called an annual well check.  Regular dental visits and eye exams.  Immunizations.  Screening for certain conditions.  Healthy lifestyle choices, such as eating a healthy diet, getting regular exercise, not using drugs or products that contain nicotine and tobacco, and limiting alcohol use. What can I expect for my preventive care visit? Physical exam Your health care provider will check your:  Height and weight. This may be used to calculate body mass index (BMI), which tells if you are at a healthy weight.  Heart rate and blood pressure.  Skin for abnormal spots. Counseling Your health care provider may ask you questions about your:  Alcohol, tobacco, and drug use.  Emotional well-being.  Home and relationship well-being.  Sexual activity.  Eating habits.  Work and work environment.  Method of birth control.  Menstrual cycle.  Pregnancy history. What immunizations do I need?  Influenza (flu) vaccine  This is recommended every year. Tetanus, diphtheria, and pertussis (Tdap) vaccine  You may need a Td booster every 10 years. Varicella (chickenpox) vaccine  You may need this if you have not been vaccinated. Human papillomavirus (HPV) vaccine  If recommended by your health care provider, you may need three doses over 6 months. Measles, mumps, and rubella (MMR) vaccine  You may need at least one dose of MMR. You may also need a second dose. Meningococcal conjugate (MenACWY) vaccine  One dose is recommended if you are age 19-21 years and a first-year college student living in a residence hall, or if you have one of several medical conditions. You may also need additional booster doses. Pneumococcal conjugate (PCV13) vaccine  You may need  this if you have certain conditions and were not previously vaccinated. Pneumococcal polysaccharide (PPSV23) vaccine  You may need one or two doses if you smoke cigarettes or if you have certain conditions. Hepatitis A vaccine  You may need this if you have certain conditions or if you travel or work in places where you may be exposed to hepatitis A. Hepatitis B vaccine  You may need this if you have certain conditions or if you travel or work in places where you may be exposed to hepatitis B. Haemophilus influenzae type b (Hib) vaccine  You may need this if you have certain conditions. You may receive vaccines as individual doses or as more than one vaccine together in one shot (combination vaccines). Talk with your health care provider about the risks and benefits of combination vaccines. What tests do I need?  Blood tests  Lipid and cholesterol levels. These may be checked every 5 years starting at age 20.  Hepatitis C test.  Hepatitis B test. Screening  Diabetes screening. This is done by checking your blood sugar (glucose) after you have not eaten for a while (fasting).  Sexually transmitted disease (STD) testing.  BRCA-related cancer screening. This may be done if you have a family history of breast, ovarian, tubal, or peritoneal cancers.  Pelvic exam and Pap test. This may be done every 3 years starting at age 21. Starting at age 30, this may be done every 5 years if you have a Pap test in combination with an HPV test. Talk with your health care provider about your test results, treatment options, and if necessary, the need for more tests.   Follow these instructions at home: Eating and drinking   Eat a diet that includes fresh fruits and vegetables, whole grains, lean protein, and low-fat dairy.  Take vitamin and mineral supplements as recommended by your health care provider.  Do not drink alcohol if: ? Your health care provider tells you not to drink. ? You are  pregnant, may be pregnant, or are planning to become pregnant.  If you drink alcohol: ? Limit how much you have to 0-1 drink a day. ? Be aware of how much alcohol is in your drink. In the [place] one drink equals one 12 oz bottle of beer (355 mL), one 5 oz glass of wine (148 mL), or one 1 oz glass of hard liquor (44 mL). Lifestyle  Take daily care of your teeth and gums.  Stay active. Exercise for at least 30 minutes on 5 or more days each week.  Do not use any products that contain nicotine or tobacco, such as cigarettes, e-cigarettes, and chewing tobacco. If you need help quitting, ask your health care provider.  If you are sexually active, practice safe sex. Use a condom or other form of birth control (contraception) in order to prevent pregnancy and STIs (sexually transmitted infections). If you plan to become pregnant, see your health care provider for a preconception visit. What's next?  Visit your health care provider once a year for a well check visit.  Ask your health care provider how often you should have your eyes and teeth checked.  Stay up to date on all vaccines. This information is not intended to replace advice given to you by your health care provider. Make sure you discuss any questions you have with your health care provider. Document Revised: 06/28/2018 Document Reviewed: 06/28/2018 Elsevier Patient Education  2020 [employer].

## 2020-03-05 ENCOUNTER — Telehealth: Payer: Self-pay

## 2020-03-05 ENCOUNTER — Other Ambulatory Visit: Payer: Self-pay | Admitting: *Deleted

## 2020-03-05 LAB — CERVICOVAGINAL ANCILLARY ONLY
Bacterial Vaginitis (gardnerella): POSITIVE — AB
Candida Glabrata: NEGATIVE
Candida Vaginitis: NEGATIVE
Chlamydia: NEGATIVE
Comment: NEGATIVE
Comment: NEGATIVE
Comment: NEGATIVE
Comment: NEGATIVE
Comment: NEGATIVE
Comment: NORMAL
Neisseria Gonorrhea: NEGATIVE
Trichomonas: NEGATIVE

## 2020-03-05 MED ORDER — METRONIDAZOLE 500 MG PO TABS: 500.0000 mg | ORAL_TABLET | Freq: Two times a day (BID) | ORAL | 0 refills | Status: DC

## 2020-03-05 MED ORDER — FLUCONAZOLE 150 MG PO TABS: 150.0000 mg | ORAL_TABLET | Freq: Once | ORAL | 0 refills | Status: AC

## 2020-03-05 NOTE — Telephone Encounter (Signed)
Depo 80 mg administered via Right Ventrogluteal.

## 2020-03-05 NOTE — Telephone Encounter (Signed)
Pt stated that she ate a salad at lunch and was told that the tomatoes were left on the side. Pt states that she is very allergic to tomatoes and her tongue fells that it is swelling. She stated that she is not having difficulty breathing and it is not difficult to swallow. Pt states that usually has her epipen and benadryl with her in the car but she drove her husbands car today.   Please advise

## 2020-03-09 LAB — CYTOLOGY - PAP
Comment: NEGATIVE
Diagnosis: NEGATIVE
Diagnosis: REACTIVE
High risk HPV: NEGATIVE

## 2020-03-24 ENCOUNTER — Telehealth: Payer: Self-pay | Admitting: Orthopaedic Surgery

## 2020-03-24 NOTE — Telephone Encounter (Signed)
We will have to wait 6 weeks from when last saw her until we can resubmit for the MRI

## 2020-03-24 NOTE — Telephone Encounter (Signed)
Pt called stating that her insurance denied the MRI and she would like a call back to discuss the next steps now.   [phone]

## 2020-03-27 ENCOUNTER — Ambulatory Visit: Payer: Medicaid Other | Admitting: Orthopaedic Surgery

## 2020-03-27 NOTE — Telephone Encounter (Signed)
Called patient to let her know. No answer LMOM.

## 2020-04-22 ENCOUNTER — Ambulatory Visit: Payer: Medicaid Other | Admitting: Family

## 2020-04-22 ENCOUNTER — Telehealth: Payer: Self-pay | Admitting: *Deleted

## 2020-04-22 NOTE — Telephone Encounter (Signed)
Pt called asking about her MRI that was supposed to have been ordered and was asking when she will be scheduled, I informed pt order was not placed since back in April and I will go ahead and place it since Dr. Erlinda Hong on previous said ok to resubmit. Then pt informed me her left knee has been swellling and pain and going down to her ankles. She also told me she has history of blood clots and is worried about there being one. I asked pt if I could schedule her to come in to have it looked at and he agreed and is scheduled to see Dondra Prader NP on Friday for evaluation.

## 2020-04-24 ENCOUNTER — Other Ambulatory Visit: Payer: Self-pay | Admitting: Orthopaedic Surgery

## 2020-04-24 ENCOUNTER — Encounter: Payer: Self-pay | Admitting: Family

## 2020-04-24 ENCOUNTER — Other Ambulatory Visit: Payer: Self-pay

## 2020-04-24 ENCOUNTER — Ambulatory Visit (HOSPITAL_COMMUNITY)
Admission: RE | Admit: 2020-04-24 | Discharge: 2020-04-24 | Disposition: A | Discharge status: Home / Self Care | Payer: Medicaid Other | Source: Ambulatory Visit | Attending: Family | Admitting: Family

## 2020-04-24 ENCOUNTER — Ambulatory Visit (INDEPENDENT_AMBULATORY_CARE_PROVIDER_SITE_OTHER): Payer: Medicaid Other | Admitting: Family

## 2020-04-24 VITALS — Ht 66.0 in | Wt 210.0 lb

## 2020-04-24 DIAGNOSIS — M79662 Pain in left lower leg: Secondary | ICD-10-CM | POA: Diagnosis present

## 2020-04-24 DIAGNOSIS — G8929 Other chronic pain: Secondary | ICD-10-CM
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
## 2020-04-24 NOTE — Progress Notes (Signed)
Left lower extremity venous duplex has been completed. Preliminary results can be found in CV Proc through chart review.  Results were faxed to Dondra Prader NP.  04/24/20 2:50 PM Carlos Levering RVT

## 2020-04-27 ENCOUNTER — Ambulatory Visit (INDEPENDENT_AMBULATORY_CARE_PROVIDER_SITE_OTHER): Payer: Medicaid Other | Admitting: Student in an Organized Health Care Education/Training Program

## 2020-04-27 ENCOUNTER — Other Ambulatory Visit: Payer: Self-pay

## 2020-04-27 DIAGNOSIS — Z7689 Persons encountering health services in other specified circumstances: Secondary | ICD-10-CM

## 2020-04-27 DIAGNOSIS — Z9851 Tubal ligation status: Secondary | ICD-10-CM | POA: Insufficient documentation

## 2020-04-27 DIAGNOSIS — F4310 Post-traumatic stress disorder, unspecified: Secondary | ICD-10-CM

## 2020-04-27 DIAGNOSIS — A6000 Herpesviral infection of urogenital system, unspecified: Secondary | ICD-10-CM

## 2020-04-27 DIAGNOSIS — R5383 Other fatigue: Secondary | ICD-10-CM | POA: Insufficient documentation

## 2020-04-27 NOTE — Patient Instructions (Signed)
It was a pleasure to see you today!  To summarize our discussion for this visit:  We went over a lot of information for our first visit. Lets follow up in a few weeks to go more in depth on the topics we discussed today  I am giving you some resources today for counselors in the area  Trauma institutue  [facility]  Also, I recommend you go to [email or web address] to look for online counseling resources.   Some additional health maintenance measures we should update are: Health Maintenance Due  Topic Date Due  . COVID-19 Vaccine (1) Never done  .    Call the clinic at [phone] if your symptoms worsen or you have any concerns.   Thank you for allowing me to take part in your care,  Dr. Doristine Mango

## 2020-04-27 NOTE — Assessment & Plan Note (Signed)
BTL 2021

## 2020-04-27 NOTE — Assessment & Plan Note (Signed)
Reviewed PMHx as seen above.

## 2020-04-27 NOTE — Assessment & Plan Note (Signed)
Last outbreak during pregnancy.  Not currently having any symptoms and not medicated

## 2020-04-27 NOTE — Assessment & Plan Note (Signed)
Provided with local counseling centers with experience in trauma counseling.  Will follow up at next visit Denied current SI/self-harm thoughts
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
## 2020-04-27 NOTE — Assessment & Plan Note (Signed)
Unspecified. H/o anemia At next visit- checking CBC and TSH as well as vitamins

## 2020-05-08 ENCOUNTER — Ambulatory Visit
Admission: RE | Admit: 2020-05-08 | Discharge: 2020-05-08 | Disposition: A | Discharge status: Home / Self Care | Payer: Medicaid Other | Source: Ambulatory Visit | Attending: Orthopaedic Surgery | Admitting: Orthopaedic Surgery

## 2020-05-08 DIAGNOSIS — M25562 Pain in left knee: Secondary | ICD-10-CM

## 2020-05-08 DIAGNOSIS — G8929 Other chronic pain: Secondary | ICD-10-CM

## 2020-05-13 ENCOUNTER — Ambulatory Visit (INDEPENDENT_AMBULATORY_CARE_PROVIDER_SITE_OTHER): Payer: Medicaid Other | Admitting: Nurse Practitioner

## 2020-05-13 ENCOUNTER — Other Ambulatory Visit (HOSPITAL_COMMUNITY)
Admission: RE | Admit: 2020-05-13 | Discharge: 2020-05-13 | Disposition: A | Discharge status: Home / Self Care | Payer: Medicaid Other | Source: Ambulatory Visit | Attending: Family Medicine | Admitting: Family Medicine

## 2020-05-13 ENCOUNTER — Ambulatory Visit (INDEPENDENT_AMBULATORY_CARE_PROVIDER_SITE_OTHER): Payer: Medicaid Other | Admitting: Orthopaedic Surgery

## 2020-05-13 ENCOUNTER — Other Ambulatory Visit: Payer: Self-pay

## 2020-05-13 ENCOUNTER — Encounter: Payer: Self-pay | Admitting: Orthopaedic Surgery

## 2020-05-13 VITALS — BP 113/78 | HR 75

## 2020-05-13 VITALS — Ht 67.0 in | Wt 212.2 lb

## 2020-05-13 DIAGNOSIS — M25562 Pain in left knee: Secondary | ICD-10-CM

## 2020-05-13 DIAGNOSIS — G8929 Other chronic pain: Secondary | ICD-10-CM | POA: Diagnosis not present

## 2020-05-13 DIAGNOSIS — N920 Excessive and frequent menstruation with regular cycle: Secondary | ICD-10-CM

## 2020-05-13 DIAGNOSIS — R1030 Lower abdominal pain, unspecified: Secondary | ICD-10-CM | POA: Diagnosis present

## 2020-05-13 DIAGNOSIS — N898 Other specified noninflammatory disorders of vagina: Secondary | ICD-10-CM | POA: Diagnosis present

## 2020-05-13 LAB — POCT URINALYSIS DIP (DEVICE)
Bilirubin Urine: NEGATIVE
Glucose, UA: NEGATIVE mg/dL
Ketones, ur: NEGATIVE mg/dL
Leukocytes,Ua: NEGATIVE
Nitrite: NEGATIVE
Protein, ur: NEGATIVE mg/dL
Specific Gravity, Urine: 1.025 (ref 1.005–1.030)
Urobilinogen, UA: 0.2 mg/dL (ref 0.0–1.0)
pH: 6.5 (ref 5.0–8.0)

## 2020-05-13 MED ORDER — NORETHINDRONE 0.35 MG PO TABS: 1.0000 | ORAL_TABLET | Freq: Every day | ORAL | 2 refills | Status: DC
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
## 2020-05-14 LAB — CERVICOVAGINAL ANCILLARY ONLY
Bacterial Vaginitis (gardnerella): POSITIVE — AB
Candida Glabrata: NEGATIVE
Candida Vaginitis: NEGATIVE
Chlamydia: NEGATIVE
Comment: NEGATIVE
Comment: NEGATIVE
Comment: NEGATIVE
Comment: NEGATIVE
Comment: NEGATIVE
Comment: NORMAL
Neisseria Gonorrhea: NEGATIVE
Trichomonas: NEGATIVE

## 2020-05-15 LAB — URINE CULTURE: Organism ID, Bacteria: NO GROWTH

## 2020-05-17 MED ORDER — METRONIDAZOLE 500 MG PO TABS: 500.0000 mg | ORAL_TABLET | Freq: Two times a day (BID) | ORAL | 0 refills | Status: AC

## 2020-05-17 NOTE — Addendum Note (Signed)
Addended by: Virginia Rochester on: 05/17/2020 01:53 PM   Modules accepted: Orders

## 2020-05-18 ENCOUNTER — Ambulatory Visit: Payer: Medicaid Other | Admitting: Orthopedic Surgery

## 2020-05-29 ENCOUNTER — Encounter: Payer: Self-pay | Admitting: Student in an Organized Health Care Education/Training Program

## 2020-05-29 ENCOUNTER — Other Ambulatory Visit: Payer: Self-pay

## 2020-05-29 ENCOUNTER — Ambulatory Visit (INDEPENDENT_AMBULATORY_CARE_PROVIDER_SITE_OTHER): Payer: Medicaid Other | Admitting: Student in an Organized Health Care Education/Training Program

## 2020-05-29 VITALS — BP 124/84 | HR 100 | Ht 66.0 in | Wt 213.4 lb

## 2020-05-29 DIAGNOSIS — G8929 Other chronic pain: Secondary | ICD-10-CM | POA: Diagnosis not present

## 2020-05-29 DIAGNOSIS — M549 Dorsalgia, unspecified: Secondary | ICD-10-CM | POA: Diagnosis present

## 2020-05-29 DIAGNOSIS — M4800 Spinal stenosis, site unspecified: Secondary | ICD-10-CM | POA: Diagnosis not present

## 2020-05-29 DIAGNOSIS — R5383 Other fatigue: Secondary | ICD-10-CM | POA: Diagnosis not present

## 2020-05-29 MED ORDER — DULOXETINE HCL 20 MG PO CPEP: 20.0000 mg | ORAL_CAPSULE | Freq: Every day | ORAL | 1 refills | Status: DC

## 2020-05-29 MED ORDER — PREGABALIN 25 MG PO CAPS: 25.0000 mg | ORAL_CAPSULE | Freq: Two times a day (BID) | ORAL | 0 refills | Status: DC

## 2020-05-29 MED ORDER — GABAPENTIN 600 MG PO TABS: 600.0000 mg | ORAL_TABLET | Freq: Every day | ORAL | 0 refills | Status: DC

## 2020-05-29 NOTE — Progress Notes (Signed)
° ° °  SUBJECTIVE:   CHIEF COMPLAINT / HPI: back pain  Back pain- chronic and unchanged.  Feels that it was worsened after childbirth.  In the past, has had multiple over-the-counter, prescription pain medications as well as steroid injections with minimal relief.  She sees [facility] ortho care for knee. Previously seen in [place] for spine. MRI revealed stenosis, disc degeneration.  Prefers to exhaust all other options before considering surgery.  Her pain is impacting her daily activities. Takes advil 400mg  every 6-8hrs.  Hasn't taken for about a week. Felt her stomach ulcer acting up. Tylenol does not help at all.  Fatigue-patient has chronic and unchanged fatigue which was to be evaluated at our last appointment with lab work but she did not have time to get her blood drawn at that time.  Given her continues of symptoms we will order those labs at this appointment.  OBJECTIVE:   BP 124/84    Pulse 100    Ht 5\' 6"  (1.676 m)    Wt (!) 213 lb 6.4 oz (96.8 kg)    LMP 04/30/2020 (Exact Date)    SpO2 97%    BMI 34.44 kg/m   General: NAD, pleasant, able to participate in exam Abdomen: soft, nontender, nondistended, no hepatic or splenomegaly, +BS Extremities: no edema or cyanosis. WWP. Back: Negative spurling bilaterally.  Negative straight leg raise bilaterally.  Tenderness to palpation along spine diffusely. Skin: warm and dry, no rashes noted Neuro: alert and oriented x4, no focal deficits Psych: Normal affect and mood  ASSESSMENT/PLAN:   Spinal stenosis Patient has long history of spinal pathology including stenosis, disc herniation and spondylosis. -Recommend decreasing amount of NSAIDs -Prescribed Cymbalta and gabapentin nightly -Referred to physical therapy -Referred to orthopedic spinal surgeon  Fatigue CBC, BMP, vitamin D, vitamin B12, TSH, folate     Richarda Osmond, [place]

## 2020-05-29 NOTE — Assessment & Plan Note (Signed)
Patient has long history of spinal pathology including stenosis, disc herniation and spondylosis. -Recommend decreasing amount of NSAIDs -Prescribed Cymbalta and gabapentin nightly -Referred to physical therapy -Referred to orthopedic spinal surgeon

## 2020-05-29 NOTE — Patient Instructions (Addendum)
It was a pleasure to see you today!  To summarize our discussion for this visit:  For your back pain, we will try to treat medically with Cymbalta to take daily and gabapentin to take at bedtime as this can cause you to be drowsy and may be help with your sleep.  I have referred you to physical therapy for your back.  I have also referred you to a spinal orthopedic surgeon for evaluation.  We are obtaining blood work today to establish a basis for fatigue or contribution to her symptoms and can discuss further options for treatment when we have those results back.  Some additional health maintenance measures we should update are: Health Maintenance Due  Topic Date Due  . COVID-19 Vaccine (1) Never done  .    Please return to our clinic to see me in about 2 months.  Call the clinic at [phone] if your symptoms worsen or you have any concerns.   Thank you for allowing me to take part in your care,  Dr. Doristine Mango

## 2020-05-30 LAB — CBC
Hematocrit: 39.1 % (ref 34.0–46.6)
Hemoglobin: 13.5 g/dL (ref 11.1–15.9)
MCH: 30.3 pg (ref 26.6–33.0)
MCHC: 34.5 g/dL (ref 31.5–35.7)
MCV: 88 fL (ref 79–97)
Platelets: 279 10*3/uL (ref 150–450)
RBC: 4.46 x10E6/uL (ref 3.77–5.28)
RDW: 12.9 % (ref 11.7–15.4)
WBC: 9 10*3/uL (ref 3.4–10.8)

## 2020-05-30 LAB — BASIC METABOLIC PANEL
BUN/Creatinine Ratio: 16 (ref 9–23)
BUN: 12 mg/dL (ref 6–20)
CO2: 26 mmol/L (ref 20–29)
Calcium: 9.9 mg/dL (ref 8.7–10.2)
Chloride: 101 mmol/L (ref 96–106)
Creatinine, Ser: 0.75 mg/dL (ref 0.57–1.00)
GFR calc Af Amer: 119 mL/min/{1.73_m2} (ref >59)
GFR calc non Af Amer: 104 mL/min/{1.73_m2} (ref >59)
Glucose: 88 mg/dL (ref 65–99)
Potassium: 4.7 mmol/L (ref 3.5–5.2)
Sodium: 139 mmol/L (ref 134–144)

## 2020-05-30 LAB — VITAMIN D 25 HYDROXY (VIT D DEFICIENCY, FRACTURES): Vit D, 25-Hydroxy: 15.1 ng/mL — ABNORMAL LOW (ref 30.0–100.0)

## 2020-05-30 LAB — FOLATE: Folate: 8 ng/mL (ref >3.0)

## 2020-05-30 LAB — VITAMIN B12: Vitamin B-12: 366 pg/mL (ref 232–1245)

## 2020-05-30 LAB — TSH: TSH: 1.09 u[IU]/mL (ref 0.450–4.500)

## 2020-06-01 ENCOUNTER — Encounter: Payer: Self-pay | Admitting: Student in an Organized Health Care Education/Training Program

## 2020-06-01 ENCOUNTER — Other Ambulatory Visit: Payer: Self-pay | Admitting: Student in an Organized Health Care Education/Training Program

## 2020-06-01 DIAGNOSIS — E559 Vitamin D deficiency, unspecified: Secondary | ICD-10-CM

## 2020-06-01 MED ORDER — VITAMIN D (ERGOCALCIFEROL) 1.25 MG (50000 UNIT) PO CAPS: 50000.0000 [IU] | ORAL_CAPSULE | ORAL | 0 refills | Status: DC

## 2020-06-01 NOTE — Progress Notes (Signed)
Vitamin D deficiency. Filling 50,000u vit D weekly x5 weeks. mychart message to patient.

## 2020-06-03 NOTE — Assessment & Plan Note (Signed)
CBC, BMP, vitamin D, vitamin B12, TSH, folate

## 2020-06-04 ENCOUNTER — Other Ambulatory Visit: Payer: Self-pay | Admitting: Lactation Services

## 2020-06-04 ENCOUNTER — Ambulatory Visit (INDEPENDENT_AMBULATORY_CARE_PROVIDER_SITE_OTHER): Payer: Medicaid Other | Admitting: Student in an Organized Health Care Education/Training Program

## 2020-06-04 ENCOUNTER — Other Ambulatory Visit: Payer: Self-pay

## 2020-06-04 DIAGNOSIS — Z23 Encounter for immunization: Secondary | ICD-10-CM

## 2020-06-04 MED ORDER — FLUCONAZOLE 150 MG PO TABS
150.0000 mg | ORAL_TABLET | Freq: Once | ORAL | 0 refills | Status: AC
Start: 2020-06-04 — End: 2020-06-04

## 2020-06-04 NOTE — Progress Notes (Signed)
   Covid-19 Vaccination Clinic  Name:  Meghan Reed    MRN: [id number] DOB: 1985-11-25  06/04/2020  Meghan Reed was observed post Covid-19 immunization for 15 minutes without incident. She was provided with Vaccine Information Sheet and instruction to access the V-Safe system.   Meghan Reed was instructed to call 911 with any severe reactions post vaccine: Marland Kitchen Difficulty breathing  . Swelling of face and throat  . A fast heartbeat  . A bad rash all over body  . Dizziness and weakness

## 2020-06-04 NOTE — Progress Notes (Signed)
Patient c/o white discharge with itching following a course of ATB. Diflucan sent to Pharmacy per Standing Protocol.

## 2020-06-15 ENCOUNTER — Telehealth: Payer: Self-pay | Admitting: Family Medicine

## 2020-06-15 ENCOUNTER — Other Ambulatory Visit: Payer: Self-pay

## 2020-06-15 ENCOUNTER — Encounter (HOSPITAL_COMMUNITY): Payer: Self-pay

## 2020-06-15 ENCOUNTER — Ambulatory Visit (INDEPENDENT_AMBULATORY_CARE_PROVIDER_SITE_OTHER): Payer: Medicaid Other | Admitting: Family Medicine

## 2020-06-15 ENCOUNTER — Emergency Department (HOSPITAL_COMMUNITY)
Admission: EM | Admit: 2020-06-15 | Discharge: 2020-06-16 | Disposition: A | Discharge status: Home / Self Care | Payer: Medicaid Other | Attending: Emergency Medicine | Admitting: Emergency Medicine

## 2020-06-15 ENCOUNTER — Ambulatory Visit (HOSPITAL_COMMUNITY)
Admission: RE | Admit: 2020-06-15 | Discharge: 2020-06-15 | Disposition: A | Discharge status: Home / Self Care | Payer: Medicaid Other | Source: Ambulatory Visit | Attending: Family Medicine | Admitting: Family Medicine

## 2020-06-15 ENCOUNTER — Telehealth: Payer: Self-pay

## 2020-06-15 VITALS — BP 104/70 | HR 80 | Ht 66.0 in | Wt 216.0 lb

## 2020-06-15 DIAGNOSIS — Z5321 Procedure and treatment not carried out due to patient leaving prior to being seen by health care provider: Secondary | ICD-10-CM | POA: Diagnosis not present

## 2020-06-15 DIAGNOSIS — M79604 Pain in right leg: Secondary | ICD-10-CM | POA: Diagnosis present

## 2020-06-15 DIAGNOSIS — M79651 Pain in right thigh: Secondary | ICD-10-CM | POA: Insufficient documentation

## 2020-06-15 DIAGNOSIS — M549 Dorsalgia, unspecified: Secondary | ICD-10-CM | POA: Diagnosis not present

## 2020-06-15 DIAGNOSIS — R29898 Other symptoms and signs involving the musculoskeletal system: Secondary | ICD-10-CM | POA: Insufficient documentation

## 2020-06-15 NOTE — Patient Instructions (Addendum)
It was very nice to meet you today. Today you were seen for right leg pain. You are scheduled for an ultrasound today at 345pm. I will call you with results from the ultrasound. Please go to ED if your shortness of breath becomes worse, or you develop chest pain/chest tightness.   Please call the clinic at [phone] if you have any concerns. It was our pleasure to serve you.

## 2020-06-15 NOTE — Telephone Encounter (Signed)
Emergency After-Hours Line:  Received critical lab value page from [facility] noting an elevated d-dimer of 2.02 (Phone #: [phone], Ref #[id number]).  Patient is a 34 year old female with a past medical history of pulmonary embolism in 08/26/2019.  Patient was seen in clinic today due to right lower extremity pain with associated swelling and worsening shortness of breath.  Her lower extremity Doppler was negative for DVT.  Called patient this evening and she noted continued right lower extremity pain and swelling.  She endorses labored breathing and SOB with prolonged talking or any form of exertion.  She notes that she has also developed some similar symptoms that she experienced during her prior PE such as right axilla/scapular pain.  Given her symptoms, elevated D-dimer, and history of prior PE, instructed patient to report to the ED for further evaluation and management. She voiced understanding and agreement with plan.   Mina Marble, DO [facility], PGY3 06/15/2020 8:28 PM

## 2020-06-15 NOTE — Assessment & Plan Note (Addendum)
Acute. Wells score 2. LE doppler negative for DVT. Will obtain d-dimer. Consider CTA to rule out PE if d-dimer elevated. Other ddx: muscle strain, sciatic nerve entrapment, idiopathic.  -f/u d-dimer -CBC w/ diff, CMP pending -Consider PE rule out with CTA -Patient given strict return precautions and when to go to ED.

## 2020-06-15 NOTE — Progress Notes (Signed)
    SUBJECTIVE:   CHIEF COMPLAINT / HPI:   Meghan Reed is a 34 yo F who presents for the below issue.   Right Leg Pain Patient began to experience right calf pain and swelling Saturday evening.  This pain woke her up out of her sleep.  She also has associated shortness of breath with and without activity mostly with walking.  Also endorsing a right scapular pain that is uncomfortable and feels like there is stabbing inside which also started Saturday.  Today when she woke up the pain was radiating into her thigh and no longer in her calf. Does not endorse chest pain or chest tightness.  PERTINENT  PMH / PSH:  -PE (08/26/2019 at [redacted] weeks gestation treated with Lovenox which was discontinued to 11/21.)  -04/24/2020 seen in orthopedic office for left calf pain concern for DVT.  Negative Doppler same day. -We tubal ligation March 2021 -Endorses OCP use (Micronor), has irregular periods, LMP 05/31/2020  OBJECTIVE:   BP 104/70   Pulse 80   Ht 5\' 6"  (1.676 m)   Wt 216 lb (98 kg)   LMP 05/31/2020   SpO2 98%   BMI 34.86 kg/m   General: Appears well, no acute distress. Age appropriate. Cardiac: RRR, normal heart sounds, no murmurs Respiratory: CTAB, normal effort Extremities: No LE edema. Posterior tibial pulses present and even bilaterally. Positive homans sign on the right. Negative homans on the left. Right hamstring tender to palpation.   ASSESSMENT/PLAN:   Right leg pain Acute. Wells score 2. LE doppler negative for DVT. Will obtain d-dimer. Consider CTA to rule out PE if d-dimer elevated. Other ddx: muscle strain, sciatic nerve entrapment, idiopathic.  -f/u d-dimer -CBC w/ diff, CMP pending -Consider PE rule out with CTA -Patient given strict return precautions and when to go to ED.    Gerlene Fee, [place]

## 2020-06-15 NOTE — Assessment & Plan Note (Signed)
>>  ASSESSMENT AND PLAN FOR RIGHT LEG PAIN WRITTEN ON 06/15/2020  3:57 PM BY AUTRY-LOTT, SIMONE, DO  Acute. Wells score 2. LE doppler negative for DVT. Will obtain d-dimer. Consider CTA to rule out PE if d-dimer elevated. Other ddx: muscle strain, sciatic nerve entrapment, idiopathic.  -f/u d-dimer -CBC w/ diff, CMP pending -Consider PE rule out with CTA -Patient given strict return precautions and when to go to ED.

## 2020-06-15 NOTE — Addendum Note (Signed)
Addended by: Maryland Pink on: 06/15/2020 04:06 PM   Modules accepted: Orders

## 2020-06-15 NOTE — ED Triage Notes (Signed)
Pt sts right leg and thigh pain since Saturday. Pt Sts pain moved and is worse in her right upper  Back. Pt sts hx of PE and a positive dimer that resulted above 2.0 today at PCP. Sent here for CTA.Marland Kitchen

## 2020-06-15 NOTE — Telephone Encounter (Signed)
Patient calls nurse line reporting pain in calf muscle. Patient states that it is difficulty to walk on and there is pain when she flexes her muscle. Patient reports history of blood clots. Scheduled patient same day appointment for evaluation.   Strict ED precautions given.   To PCP  Talbot Grumbling, RN

## 2020-06-16 ENCOUNTER — Ambulatory Visit (INDEPENDENT_AMBULATORY_CARE_PROVIDER_SITE_OTHER): Payer: Medicaid Other | Admitting: Family Medicine

## 2020-06-16 ENCOUNTER — Emergency Department (HOSPITAL_COMMUNITY): Payer: Medicaid Other

## 2020-06-16 ENCOUNTER — Other Ambulatory Visit: Payer: Medicaid Other

## 2020-06-16 ENCOUNTER — Encounter: Payer: Self-pay | Admitting: Family Medicine

## 2020-06-16 ENCOUNTER — Telehealth: Payer: Self-pay | Admitting: Family Medicine

## 2020-06-16 ENCOUNTER — Other Ambulatory Visit: Payer: Self-pay | Admitting: Family Medicine

## 2020-06-16 DIAGNOSIS — M79604 Pain in right leg: Secondary | ICD-10-CM

## 2020-06-16 LAB — CBC WITH DIFFERENTIAL/PLATELET
Basophils Absolute: 0 10*3/uL (ref 0.0–0.2)
Basos: 1 %
EOS (ABSOLUTE): 0.2 10*3/uL (ref 0.0–0.4)
Eos: 3 %
Hematocrit: 37.3 % (ref 34.0–46.6)
Hemoglobin: 12.8 g/dL (ref 11.1–15.9)
Immature Grans (Abs): 0 10*3/uL (ref 0.0–0.1)
Immature Granulocytes: 1 %
Lymphocytes Absolute: 2.3 10*3/uL (ref 0.7–3.1)
Lymphs: 28 %
MCH: 30.5 pg (ref 26.6–33.0)
MCHC: 34.3 g/dL (ref 31.5–35.7)
MCV: 89 fL (ref 79–97)
Monocytes Absolute: 0.7 10*3/uL (ref 0.1–0.9)
Monocytes: 9 %
Neutrophils Absolute: 4.8 10*3/uL (ref 1.4–7.0)
Neutrophils: 58 %
Platelets: 240 10*3/uL (ref 150–450)
RBC: 4.19 x10E6/uL (ref 3.77–5.28)
RDW: 12.8 % (ref 11.7–15.4)
WBC: 8 10*3/uL (ref 3.4–10.8)

## 2020-06-16 LAB — COMPREHENSIVE METABOLIC PANEL
ALT: 26 IU/L (ref 0–32)
AST: 30 IU/L (ref 0–40)
Albumin/Globulin Ratio: 1.6 (ref 1.2–2.2)
Albumin: 4.3 g/dL (ref 3.8–4.8)
Alkaline Phosphatase: 57 IU/L (ref 48–121)
BUN/Creatinine Ratio: 16 (ref 9–23)
BUN: 10 mg/dL (ref 6–20)
Bilirubin Total: <0.2 mg/dL (ref 0.0–1.2)
CO2: 24 mmol/L (ref 20–29)
Calcium: 9 mg/dL (ref 8.7–10.2)
Chloride: 103 mmol/L (ref 96–106)
Creatinine, Ser: 0.64 mg/dL (ref 0.57–1.00)
GFR calc Af Amer: 134 mL/min/{1.73_m2} (ref >59)
GFR calc non Af Amer: 116 mL/min/{1.73_m2} (ref >59)
Globulin, Total: 2.7 g/dL (ref 1.5–4.5)
Glucose: 82 mg/dL (ref 65–99)
Potassium: 4.4 mmol/L (ref 3.5–5.2)
Sodium: 140 mmol/L (ref 134–144)
Total Protein: 7 g/dL (ref 6.0–8.5)

## 2020-06-16 LAB — D-DIMER, QUANTITATIVE: D-DIMER: 2.02 mg/L FEU — ABNORMAL HIGH (ref 0.00–0.49)

## 2020-06-16 MED ORDER — DICLOFENAC SODIUM 1 % EX GEL: 2.0000 g | Freq: Four times a day (QID) | CUTANEOUS | 0 refills | Status: DC

## 2020-06-16 MED ORDER — CYCLOBENZAPRINE HCL 10 MG PO TABS
10.0000 mg | ORAL_TABLET | Freq: Three times a day (TID) | ORAL | 0 refills | Status: DC | PRN
Start: 2020-06-16 — End: 2020-07-03

## 2020-06-16 NOTE — Progress Notes (Signed)
Cannot give oral NSAID patient has stomach ulcer. Topical sent to pharmacy.

## 2020-06-16 NOTE — Progress Notes (Signed)
    SUBJECTIVE:   CHIEF COMPLAINT / HPI:   Right leg pain: Patient previously had a DVT while pregnant and was on oral anticoagulation from October to this February.  On Saturday night she woke up with severe right leg/calf pain.  Since that time she has been complaining of calf pain and cramping daily.  She also states the right leg is more swollen than the left.  Moving her leg or walking makes the pain worse.  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  She was seen in the ED recently and had a CTA done which she says she was told was "negative".  She also had a DVT ultrasound performed yesterday which was negative for DVTs.  She is still concerned about having a blood clot because her D-dimer was slightly elevated.   PERTINENT  PMH / PSH: DVT  OBJECTIVE:   BP 118/64   Pulse (!) 108   Wt 213 lb (96.6 kg)   LMP 05/31/2020   SpO2 96%   BMI 34.38 kg/m   General: Alert and oriented.  No acute distress. CV: Regular rate and rhythm, no murmurs. Pulmonary: Lungs clear to auscultation bilaterally, no crackles or wheezes, no tachypnea, no increased work of breathing MSK: Right calf is less than 1 cm larger in diameter than left calf.  Patient has tenderness to palpation of the calf muscles in the right leg, none in the left leg.  Patient has tenderness with passive dorsiflexion of the right leg.  ASSESSMENT/PLAN:   Right leg pain Recent DVT ultrasound from yesterday was negative.  Patient reports CTA recently was also negative, but we do not have documentation to verify this.  Very minimal swelling only noticeable with measurement with tape measure.  Symptoms and history most consistent with muscle strain.  Given patient's reports of repeated muscle cramping it is possible she strained her muscle while having a leg cramp during sleep this past Saturday.  Advised patient to treat the pain with NSAIDs or Tylenol, elevate leg when not in use, use  ice or he heat therapy, and avoid overuse.  Gave patient a note stating she needs to be on light duty temporarily.     Benay Pike, MD [place]

## 2020-06-16 NOTE — ED Notes (Signed)
Pt eloped from waiting room prior to room call. Called 3X.

## 2020-06-16 NOTE — Patient Instructions (Signed)
Based on my exam I think you have a muscle strain in your calf muscle.  I do not think we need to work-up a potential DVT any further.  To treat the muscle strain, you need to just give it some rest, use NSAIDs and Tylenol for pain relief, keep it elevated when resting.  You can use ice or heat as well.  There are also compression sleeves that you can pick up from the drugstore that may provide some pain relief when wearing them.  I have written you a note for work explaining that you need to be limited in your physical activity.  You should schedule an appointment with your PCP in the next 1 to 2 months to follow-up to see if his pain has resolved.  Have a great day,  Clemetine Marker, MD    Muscle Strain A muscle strain is an injury that occurs when a muscle is stretched beyond its normal length. Usually, a small number of muscle fibers are torn when this happens. There are three types of muscle strains. First-degree strains have the least amount of muscle fiber tearing and the least amount of pain. Second-degree and third-degree strains have more tearing and pain. Usually, recovery from muscle strain takes 1-2 weeks. Complete healing normally takes 5-6 weeks. What are the causes? This condition is caused when a sudden, violent force is placed on a muscle and stretches it too far. This may occur with a fall, lifting, or sports. What increases the risk? This condition is more likely to develop in athletes and people who are physically active. What are the signs or symptoms? Symptoms of this condition include:  Pain.  Bruising.  Swelling.  Trouble using the muscle. How is this diagnosed? This condition is diagnosed based on a physical exam and your medical history. Tests may also be done, including an X-ray, ultrasound, or MRI. How is this treated? This condition is initially treated with PRICE therapy. This therapy involves:  Protecting the muscle from being injured again.  Resting the  injured muscle.  Icing the injured muscle.  Applying pressure (compression) to the injured muscle. This may be done with a splint or elastic bandage.  Raising (elevating) the injured muscle. Your health care provider may also recommend medicine for pain. Follow these instructions at home: If you have a splint:  Wear the splint as told by your health care provider. Remove it only as told by your health care provider.  Loosen the splint if your fingers or toes tingle, become numb, or turn cold and blue.  Keep the splint clean.  If the splint is not waterproof: ? Do not let it get wet. ? Cover it with a watertight covering when you take a bath or a shower. Managing pain, stiffness, and swelling   If directed, put ice on the injured area. ? If you have a removable splint, remove it as told by your health care provider. ? Put ice in a plastic bag. ? Place a towel between your skin and the bag. ? Leave the ice on for 20 minutes, 2-3 times a day.  Move your fingers or toes often to avoid stiffness and to lessen swelling.  Raise (elevate) the injured area above the level of your heart while you are sitting or lying down.  Wear an elastic bandage as told by your health care provider. Make sure that it is not too tight. General instructions  Take over-the-counter and prescription medicines only as told by your health care  provider.  Restrict your activity and rest the injured muscle as told by your health care provider. Gentle movements may be allowed.  If physical therapy was prescribed, do exercises as told by your health care provider.  Do not put pressure on any part of the splint until it is fully hardened. This may take several hours.  Do not use any products that contain nicotine or tobacco, such as cigarettes and e-cigarettes. These can delay bone healing. If you need help quitting, ask your health care provider.  Ask your health care provider when it is safe to drive if  you have a splint.  Keep all follow-up visits as told by your health care provider. This is important. How is this prevented?  Warm up before exercising. This helps to prevent future muscle strains. Contact a health care provider if:  You have more pain or swelling in the injured area. Get help right away if:  You have numbness or tingling or lose a lot of strength in the injured area. Summary  A muscle strain is an injury that occurs when a muscle is stretched beyond its normal length.  This condition is caused when a sudden, violent force is placed on a muscle and stretches it too far.  This condition is initially treated with PRICE therapy, which involves protecting, resting, icing, compressing, and elevating.  Gentle movements may be allowed. If physical therapy was prescribed, do exercises as told by your health care provider. This information is not intended to replace advice given to you by your health care provider. Make sure you discuss any questions you have with your health care provider. Document Revised: 09/29/2017 Document Reviewed: 11/23/2016 Elsevier Patient Education  2020 [employer].

## 2020-06-16 NOTE — Telephone Encounter (Signed)
Spoke with patient about visit to [facility] ED. She was told the CT scan did not show a blood clot. Made her aware that we will try to obtain these records. She is worried about her elevated d-dimer. We discussed the possibility of false elevation and false positives. She voiced understanding. I have given her return to care precautions as she continues to endorse right sided chest pain and shortness of breath. Will send in NSAIDs for relief and attempt to make a same day or next day appointment as all clinics are currently operating at 100% capacity. She is ok with this plan.   Alpha Chouinard Autry-Lott, DO 06/16/2020, 12:08 PM PGY-2, [place]

## 2020-06-17 NOTE — Assessment & Plan Note (Signed)
Recent DVT ultrasound from yesterday was negative.  Patient reports CTA recently was also negative, but we do not have documentation to verify this.  Very minimal swelling only noticeable with measurement with tape measure.  Symptoms and history most consistent with muscle strain.  Given patient's reports of repeated muscle cramping it is possible she strained her muscle while having a leg cramp during sleep this past Saturday.  Advised patient to treat the pain with NSAIDs or Tylenol, elevate leg when not in use, use ice or he heat therapy, and avoid overuse.  Gave patient a note stating she needs to be on light duty temporarily.

## 2020-06-17 NOTE — Assessment & Plan Note (Signed)
>>  ASSESSMENT AND PLAN FOR RIGHT LEG PAIN WRITTEN ON 06/17/2020 11:24 AM BY Sandre Kitty, MD  Recent DVT ultrasound from yesterday was negative.  Patient reports CTA recently was also negative, but we do not have documentation to verify this.  Very minimal swelling only noticeable with measurement with tape measure.  Symptoms and history most consistent with muscle strain.  Given patient's reports of repeated muscle cramping it is possible she strained her muscle while having a leg cramp during sleep this past Saturday.  Advised patient to treat the pain with NSAIDs or Tylenol, elevate leg when not in use, use ice or he heat therapy, and avoid overuse.  Gave patient a note stating she needs to be on light duty temporarily.

## 2020-06-20 ENCOUNTER — Ambulatory Visit: Payer: Medicaid Other | Attending: Orthopaedic Surgery | Admitting: Physical Therapy

## 2020-06-20 ENCOUNTER — Encounter: Payer: Self-pay | Admitting: Physical Therapy

## 2020-06-20 ENCOUNTER — Other Ambulatory Visit: Payer: Self-pay

## 2020-06-20 DIAGNOSIS — M545 Low back pain, unspecified: Secondary | ICD-10-CM

## 2020-06-20 DIAGNOSIS — M6281 Muscle weakness (generalized): Secondary | ICD-10-CM

## 2020-06-20 DIAGNOSIS — R262 Difficulty in walking, not elsewhere classified: Secondary | ICD-10-CM | POA: Diagnosis present

## 2020-06-20 DIAGNOSIS — G8929 Other chronic pain: Secondary | ICD-10-CM | POA: Diagnosis present

## 2020-06-20 DIAGNOSIS — M25562 Pain in left knee: Secondary | ICD-10-CM | POA: Diagnosis present

## 2020-06-21 ENCOUNTER — Encounter: Payer: Self-pay | Admitting: Physical Therapy
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
## 2020-06-26 ENCOUNTER — Ambulatory Visit (INDEPENDENT_AMBULATORY_CARE_PROVIDER_SITE_OTHER): Payer: Medicaid Other

## 2020-06-26 ENCOUNTER — Other Ambulatory Visit: Payer: Self-pay

## 2020-06-26 DIAGNOSIS — Z23 Encounter for immunization: Secondary | ICD-10-CM | POA: Diagnosis present

## 2020-06-26 NOTE — Progress Notes (Signed)
   Covid-19 Vaccination Clinic  Name:  Sibbie Flammia    MRN: [id number] DOB: 06-Jun-1986  06/26/2020  Ms. Scruggs-Hernandez was observed post Covid-19 immunization for 15 minutes without incident. She was provided with Vaccine Information Sheet and instruction to access the V-Safe system.   Ms. Dancel was instructed to call 911 with any severe reactions post vaccine: Marland Kitchen Difficulty breathing  . Swelling of face and throat  . A fast heartbeat  . A bad rash all over body  . Dizziness and weakness   #2 Covid Vaccine administered without complication.

## 2020-06-30 ENCOUNTER — Ambulatory Visit (INDEPENDENT_AMBULATORY_CARE_PROVIDER_SITE_OTHER): Payer: Medicaid Other | Admitting: Orthopaedic Surgery

## 2020-06-30 ENCOUNTER — Ambulatory Visit (INDEPENDENT_AMBULATORY_CARE_PROVIDER_SITE_OTHER): Payer: Medicaid Other

## 2020-06-30 ENCOUNTER — Encounter: Payer: Self-pay | Admitting: Orthopaedic Surgery

## 2020-06-30 VITALS — Ht 66.5 in | Wt 213.0 lb

## 2020-06-30 DIAGNOSIS — M542 Cervicalgia: Secondary | ICD-10-CM | POA: Diagnosis not present

## 2020-06-30 DIAGNOSIS — M545 Low back pain, unspecified: Secondary | ICD-10-CM

## 2020-06-30 DIAGNOSIS — G8929 Other chronic pain: Secondary | ICD-10-CM
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
## 2020-07-03 ENCOUNTER — Ambulatory Visit (INDEPENDENT_AMBULATORY_CARE_PROVIDER_SITE_OTHER): Payer: Medicaid Other | Admitting: Student in an Organized Health Care Education/Training Program

## 2020-07-03 ENCOUNTER — Other Ambulatory Visit: Payer: Self-pay

## 2020-07-03 DIAGNOSIS — G5702 Lesion of sciatic nerve, left lower limb: Secondary | ICD-10-CM | POA: Diagnosis present

## 2020-07-03 DIAGNOSIS — M4800 Spinal stenosis, site unspecified: Secondary | ICD-10-CM

## 2020-07-03 MED ORDER — CYCLOBENZAPRINE HCL 10 MG PO TABS: 10.0000 mg | ORAL_TABLET | Freq: Every evening | ORAL | 0 refills | Status: DC | PRN

## 2020-07-03 NOTE — Patient Instructions (Addendum)
It was a pleasure to see you today!  To summarize our discussion for this visit:  Your initial problem is likely the nerve root compression from your back but it is causing sequelae in other areas which we found today is your SI joint and piriformis on the left.  I am giving you some piriformis syndrome rehab to do while you are at home as well as continue to take the medications we have prescribed.  When you see your physical therapist next week please ask him to focus on these trouble areas.  Follow-up with Ortho as directed and see if they can benefit you with some steroid injections or other procedures.  Follow-up with me as needed  Some additional health maintenance measures we should update are: Health Maintenance Due  Topic Date Due  . INFLUENZA VACCINE  Never done  .   Call the clinic at [phone] if your symptoms worsen or you have any concerns.   Thank you for allowing me to take part in your care,  Dr. Doristine Mango  Piriformis Syndrome Rehab Ask your health care provider which exercises are safe for you. Do exercises exactly as told by your health care provider and adjust them as directed. It is normal to feel mild stretching, pulling, tightness, or discomfort as you do these exercises. Stop right away if you feel sudden pain or your pain gets worse. Do not begin these exercises until told by your health care provider. Stretching and range-of-motion exercises These exercises warm up your muscles and joints and improve the movement and flexibility of your hip and pelvis. The exercises also help to relieve pain, numbness, and tingling. Hip rotation This is an exercise in which you lie on your back and stretch the muscles that rotate your hip (hip rotators) to stretch your buttocks. 1. Lie on your back on a firm surface. 2. Pull your left / right knee toward your same shoulder with your left / right hand until your knee is pointing toward the ceiling. Hold your left /  right ankle with your other hand. 3. Keeping your knee steady, gently pull your left / right ankle toward your other shoulder until you feel a stretch in your buttocks. 4. Hold this position for __________ seconds. Repeat __________ times. Complete this exercise __________ times a day. Hip extensor This is an exercise in which you lie on your back and pull your knee to your chest. 1. Lie on your back on a firm surface. Both of your legs should be straight. 2. Pull your left / right knee to your chest. Hold your leg in this position by holding onto the back of your thigh or the front of your knee. 3. Hold this position for __________ seconds. 4. Slowly return to the starting position. Repeat __________ times. Complete this exercise __________ times a day. Strengthening exercises These exercises build strength and endurance in your hip and thigh muscles. Endurance is the ability to use your muscles for a long time, even after they get tired. Straight leg raises, side-lying This exercise strengthens the muscles that rotate the leg at the hip and move it away from your body (hip abductors). 1. Lie on your side with your left / right leg in the top position. Lie so your head, shoulder, knee, and hip line up. Bend your bottom knee to help you balance. 2. Lift your top leg 4-6 inches (10-15 cm) while keeping your toes pointed straight ahead. 3. Hold this position for __________ seconds. 4.  Slowly lower your leg to the starting position. 5. Let your muscles relax completely after each repetition. Repeat __________ times. Complete this exercise __________ times a day. Hip abduction and rotation This is sometimes called quadruped (on hands and knees) exercises. 1. Get on your hands and knees on a firm, lightly padded surface. Your hands should be directly below your shoulders, and your knees should be directly below your hips. 2. Lift your left / right knee out to the side. Keep your knee bent. Do not  twist your body. 3. Hold this position for __________ seconds. 4. Slowly lower your leg. Repeat __________ times. Complete this exercise __________ times a day. Straight leg raises, face-down This exercise stretches the muscles that move your hips away from the front of the pelvis (hip extensors). 1. Lie on your abdomen on a bed or a firm surface with a pillow under your hips. 2. Squeeze your buttocks muscles and lift your left / right leg about 4-6 inches (10-15 cm) off the bed. Do not let your back arch. 3. Hold this position for __________ seconds. 4. Slowly lower your leg to the starting position. 5. Let your muscles relax completely after each repetition. Repeat __________ times. Complete this exercise __________ times a day. This information is not intended to replace advice given to you by your health care provider. Make sure you discuss any questions you have with your health care provider. Document Revised: 02/07/2019 Document Reviewed: 08/09/2018 Elsevier Patient Education  [place].

## 2020-07-03 NOTE — Progress Notes (Signed)
° °  SUBJECTIVE:   CHIEF COMPLAINT / HPI: f/u for chronic back pain  Here for follow-up for chronic low back pain and radiculopathy.  Patient has been adherent with prescribed therapies including Cymbalta, Flexeril and gabapentin which she thinks has given her some moderate relief so that she is able to sleep better.  She has been seeing physical therapy and is completing exercises at home.  The stretches give her temporary relief but is still having the back pain that progressively gets worse throughout the week.  She has improvement during the weekends when she rests.  She saw Ortho and they suggested she follow-up in 8 weeks to reassess.  OBJECTIVE:   BP 135/80    Pulse (!) 111    Ht 5\' 6"  (1.676 m)    Wt 218 lb 12.8 oz (99.2 kg)    SpO2 98%    BMI 35.32 kg/m   General: NAD, pleasant, able to participate in exam Back: Flexion and extension full. Tenderness with palpation of left SI joint and piriformis as well as pain with left hip abduction. Extremities: no edema or cyanosis. WWP. Skin: warm and dry, no rashes noted Neuro: alert and oriented x4, no focal deficits Psych: Normal affect and mood  ASSESSMENT/PLAN:   Piriformis syndrome of left side Piriformis pain with resisted movement and palpation.  Patient's next rehab session is over a week away. -Asked patient to bring up her piriformis problems at next PT session and provided her with at home rehab exercises for the piriformis. -Continue Cymbalta, gabapentin and Flexeril at bedtime if needed  Spinal stenosis Follow-up with Ortho surgery in 8 weeks as directed -Can consider contacting the Ortho office and requesting to see PMR or sports medicine for steroid injections in the meantime     [place]

## 2020-07-03 NOTE — Assessment & Plan Note (Signed)
Follow-up with Ortho surgery in 8 weeks as directed -Can consider contacting the Ortho office and requesting to see PMR or sports medicine for steroid injections in the meantime

## 2020-07-03 NOTE — Assessment & Plan Note (Signed)
Piriformis pain with resisted movement and palpation.  Patient's next rehab session is over a week away. -Asked patient to bring up her piriformis problems at next PT session and provided her with at home rehab exercises for the piriformis. -Continue Cymbalta, gabapentin and Flexeril at bedtime if needed

## 2020-07-11 ENCOUNTER — Ambulatory Visit: Payer: Medicaid Other | Attending: Orthopaedic Surgery | Admitting: Physical Therapy

## 2020-07-11 ENCOUNTER — Other Ambulatory Visit: Payer: Self-pay

## 2020-07-11 ENCOUNTER — Encounter: Payer: Self-pay | Admitting: Physical Therapy

## 2020-07-11 DIAGNOSIS — M545 Low back pain, unspecified: Secondary | ICD-10-CM

## 2020-07-11 DIAGNOSIS — R262 Difficulty in walking, not elsewhere classified: Secondary | ICD-10-CM | POA: Diagnosis present

## 2020-07-11 DIAGNOSIS — M6281 Muscle weakness (generalized): Secondary | ICD-10-CM | POA: Diagnosis present

## 2020-07-11 DIAGNOSIS — G8929 Other chronic pain: Secondary | ICD-10-CM | POA: Insufficient documentation

## 2020-07-11 DIAGNOSIS — M25562 Pain in left knee: Secondary | ICD-10-CM | POA: Insufficient documentation
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
## 2020-07-15 ENCOUNTER — Ambulatory Visit: Payer: Medicaid Other | Admitting: Physical Therapy

## 2020-07-18 ENCOUNTER — Ambulatory Visit: Payer: Medicaid Other | Admitting: Physical Therapy

## 2020-07-21 ENCOUNTER — Ambulatory Visit: Payer: Medicaid Other | Admitting: Physical Therapy

## 2020-07-22 ENCOUNTER — Other Ambulatory Visit: Payer: Self-pay

## 2020-07-22 ENCOUNTER — Ambulatory Visit: Payer: Medicaid Other | Admitting: Physical Therapy

## 2020-07-22 ENCOUNTER — Encounter: Payer: Self-pay | Admitting: Physical Therapy

## 2020-07-22 DIAGNOSIS — R262 Difficulty in walking, not elsewhere classified: Secondary | ICD-10-CM

## 2020-07-22 DIAGNOSIS — M545 Low back pain, unspecified: Secondary | ICD-10-CM

## 2020-07-22 DIAGNOSIS — M25562 Pain in left knee: Secondary | ICD-10-CM

## 2020-07-22 DIAGNOSIS — M6281 Muscle weakness (generalized): Secondary | ICD-10-CM

## 2020-07-22 DIAGNOSIS — G8929 Other chronic pain: Secondary | ICD-10-CM
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
## 2020-07-23 ENCOUNTER — Telehealth: Payer: Self-pay

## 2020-07-23 MED ORDER — AMOXICILLIN-POT CLAVULANATE 875-125 MG PO TABS
1.0000 | ORAL_TABLET | Freq: Two times a day (BID) | ORAL | 0 refills | Status: DC
Start: 2020-07-23 — End: 2020-10-12

## 2020-07-23 MED ORDER — MUPIROCIN 2 % EX OINT: TOPICAL_OINTMENT | CUTANEOUS | 0 refills | Status: DC

## 2020-07-23 NOTE — Telephone Encounter (Signed)
Please write prophylactic abx for [facility] finger from ring being cut off & skin opening.

## 2020-07-23 NOTE — Addendum Note (Signed)
Addended by: Cassandria Anger on: 07/23/2020 01:29 PM   Modules accepted: Orders

## 2020-07-23 NOTE — Telephone Encounter (Signed)
Pt is aware of prescription.
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
## 2020-07-25 ENCOUNTER — Ambulatory Visit: Payer: Medicaid Other | Admitting: Physical Therapy

## 2020-07-25 ENCOUNTER — Encounter: Payer: Self-pay | Admitting: Physical Therapy

## 2020-07-25 ENCOUNTER — Other Ambulatory Visit: Payer: Self-pay

## 2020-07-25 DIAGNOSIS — R262 Difficulty in walking, not elsewhere classified: Secondary | ICD-10-CM

## 2020-07-25 DIAGNOSIS — G8929 Other chronic pain: Secondary | ICD-10-CM

## 2020-07-25 DIAGNOSIS — M545 Low back pain, unspecified: Secondary | ICD-10-CM

## 2020-07-25 DIAGNOSIS — M6281 Muscle weakness (generalized): Secondary | ICD-10-CM
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
## 2020-07-29 ENCOUNTER — Ambulatory Visit: Payer: Medicaid Other | Admitting: Physical Therapy

## 2020-08-01 ENCOUNTER — Other Ambulatory Visit: Payer: Self-pay

## 2020-08-01 ENCOUNTER — Ambulatory Visit: Payer: Medicaid Other | Attending: Orthopaedic Surgery | Admitting: Physical Therapy

## 2020-08-01 ENCOUNTER — Encounter: Payer: Self-pay | Admitting: Physical Therapy

## 2020-08-01 DIAGNOSIS — M545 Low back pain, unspecified: Secondary | ICD-10-CM | POA: Diagnosis present

## 2020-08-01 DIAGNOSIS — M6281 Muscle weakness (generalized): Secondary | ICD-10-CM | POA: Insufficient documentation

## 2020-08-01 DIAGNOSIS — G8929 Other chronic pain: Secondary | ICD-10-CM | POA: Diagnosis present

## 2020-08-01 DIAGNOSIS — R262 Difficulty in walking, not elsewhere classified: Secondary | ICD-10-CM | POA: Diagnosis present

## 2020-08-01 DIAGNOSIS — M25562 Pain in left knee: Secondary | ICD-10-CM | POA: Insufficient documentation
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
## 2020-08-05 ENCOUNTER — Ambulatory Visit: Payer: Medicaid Other | Admitting: Physical Therapy

## 2020-08-05 ENCOUNTER — Ambulatory Visit (INDEPENDENT_AMBULATORY_CARE_PROVIDER_SITE_OTHER): Payer: Medicaid Other | Admitting: Family Medicine

## 2020-08-05 ENCOUNTER — Encounter: Payer: Self-pay | Admitting: Family Medicine

## 2020-08-05 ENCOUNTER — Other Ambulatory Visit: Payer: Self-pay

## 2020-08-05 VITALS — BP 126/74 | HR 57 | Wt 216.0 lb

## 2020-08-05 DIAGNOSIS — S93491D Sprain of other ligament of right ankle, subsequent encounter: Secondary | ICD-10-CM

## 2020-08-05 DIAGNOSIS — S92514D Nondisplaced fracture of proximal phalanx of right lesser toe(s), subsequent encounter for fracture with routine healing: Secondary | ICD-10-CM

## 2020-08-05 NOTE — Patient Instructions (Signed)
Thank you for coming to see me today. It was a pleasure. Today we talked about:   Your toe and ankle will continue to heal.  Given that you need to be able to work, we will give you a prescription for a scooter so that you can use this at work.  Remember it is very important to stay mobile as much as possible especially with your history of blood clot.  I will also write you a letter for the next week to have limited standing and walking while you are at work.  Please follow-up with me or PCP in 3 weeks if pain has not resolved.  If you have any questions or concerns, please do not hesitate to call the office at [phone].  Best,   [place] Constable, DO

## 2020-08-05 NOTE — Progress Notes (Addendum)
    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 Having significant pain even with the boot when she is trying to work Now her left leg is starting to hurt because she is favoring her right foot She was told that her work would not accommodate her by bringing her the patients to room She states that she has history of DVT and has been told to not take NSAIDs Records obtained from [facility] showing fracture of proximal fourth phalange on right without displacement, negative x-ray films, advised to follow-up in 3 weeks, given walking boot for comfort   PERTINENT  PMH / PSH: Hx DVT, Hx IDA, spinal stenosis  OBJECTIVE:   BP 126/74   Pulse (!) 57   Wt 216 lb (98 kg) Comment: reported per patient. Unable to weigh due to boot on R foot.  SpO2 96%   BMI 34.86 kg/m    Physical Exam:  General: 34 y.o. female in NAD Lungs: No increased work of breathing on room air Skin: warm and dry Extremities: right foot in walking boot, no obvious deformity or swelling of bilateral feet, right 4th toe with tenderness to light palpation, mild tenderness palpation along fourth metatarsal bone, tenderness palpation at location of ATFL, range of motion and strength testing not performed secondary to pain and cooperation from patient     ASSESSMENT/PLAN:    Sprain of anterior talofibular ligament of right ankle Negative x-rays at [facility] urgent care.  Pain over ATFL.  Likely moderate ankle sprain.  Plan same per above.  Also advised to use ice.  Closed  nondisplaced fracture of proximal phalanx of lesser toe of right foot Patient advised that she does not need to be nonweightbearing due to fracture, but if she is limited by pain, can continue to use walking boot for comfort.  Offered crutches, patient states that she is unable to use these at work.  Given this, offered prescription for knee scooter, which patient agreed to.  She was given a written prescription for this to take to a medical supply store.  Advised that healing improves with weightbearing, and encouraged her to weight-bear and walk as much as she is able to.  Also advised that it is important to continue to move her legs and to avoid immobility given her history of DVT especially.  She can continue to use Tylenol as needed for pain.  They have also prescribed hydrocodone/acetaminophen for her to use at night, advised that she can continue to use this prescription, but do not use during the day.  She was given a note to take to work as well to limit her walking and standing in hopes that they will be able to bring patients to her so she can continue to work.     Cleophas Dunker, [place]

## 2020-08-06 DIAGNOSIS — S92514A Nondisplaced fracture of proximal phalanx of right lesser toe(s), initial encounter for closed fracture: Secondary | ICD-10-CM | POA: Insufficient documentation

## 2020-08-06 DIAGNOSIS — S93491A Sprain of other ligament of right ankle, initial encounter: Secondary | ICD-10-CM | POA: Insufficient documentation

## 2020-08-06 NOTE — Assessment & Plan Note (Addendum)
Patient advised that she does not need to be nonweightbearing due to fracture, but if she is limited by pain, can continue to use walking boot for comfort.  Offered crutches, patient states that she is unable to use these at work.  Given this, offered prescription for knee scooter, which patient agreed to.  She was given a written prescription for this to take to a medical supply store.  Advised that healing improves with weightbearing, and encouraged her to weight-bear and walk as much as she is able to.  Also advised that it is important to continue to move her legs and to avoid immobility given her history of DVT especially.  She can continue to use Tylenol as needed for pain.  They have also prescribed hydrocodone/acetaminophen for her to use at night, advised that she can continue to use this prescription, but do not use during the day.  She was given a note to take to work as well to limit her walking and standing in hopes that they will be able to bring patients to her so she can continue to work.

## 2020-08-06 NOTE — Assessment & Plan Note (Signed)
Negative x-rays at [facility] urgent care.  Pain over ATFL.  Likely moderate ankle sprain.  Plan same per above.  Also advised to use ice.

## 2020-08-06 NOTE — Assessment & Plan Note (Signed)
Patient advised that she does not need to be nonweightbearing due to fracture, but if she is limited by pain, can continue to use walking boot for comfort.  Offered crutches, patient states that she is unable to use these at work.  Given this, offered prescription for knee scooter, which patient agreed to.  She was given a written prescription for this to take to a medical supply store.  Advised that healing improves with weightbearing, and encouraged her to weight-bear and walk as much as she is able to.  Also advised that it is important to continue to move her legs and to avoid immobility given her history of DVT especially.  She can continue to use Tylenol as needed for pain.  They have also prescribed hydrocodone/acetaminophen for her to use at night, advised that she can continue to use this prescription, but do not use during the day.  She was given a note to take to work as well to limit her walking and standing in hopes that they will be able to bring patients to her so she can continue to work.

## 2020-08-08 ENCOUNTER — Ambulatory Visit: Payer: Medicaid Other | Admitting: Physical Therapy

## 2020-08-11 ENCOUNTER — Ambulatory Visit: Payer: Medicaid Other | Admitting: Physical Therapy

## 2020-08-13 ENCOUNTER — Other Ambulatory Visit: Payer: Self-pay

## 2020-08-13 ENCOUNTER — Ambulatory Visit: Payer: Medicaid Other | Admitting: Physical Therapy

## 2020-08-13 DIAGNOSIS — R262 Difficulty in walking, not elsewhere classified: Secondary | ICD-10-CM

## 2020-08-13 DIAGNOSIS — G8929 Other chronic pain: Secondary | ICD-10-CM

## 2020-08-13 DIAGNOSIS — M25562 Pain in left knee: Secondary | ICD-10-CM

## 2020-08-13 DIAGNOSIS — M6281 Muscle weakness (generalized): Secondary | ICD-10-CM

## 2020-08-13 DIAGNOSIS — M545 Low back pain, unspecified: Secondary | ICD-10-CM | POA: Diagnosis not present
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
## 2020-08-17 ENCOUNTER — Encounter: Payer: Self-pay | Admitting: Student in an Organized Health Care Education/Training Program

## 2020-08-22 ENCOUNTER — Ambulatory Visit: Payer: Medicaid Other | Admitting: Physical Therapy

## 2020-08-25 ENCOUNTER — Ambulatory Visit: Payer: Medicaid Other | Admitting: Orthopaedic Surgery

## 2020-08-28 ENCOUNTER — Telehealth (INDEPENDENT_AMBULATORY_CARE_PROVIDER_SITE_OTHER): Payer: Medicaid Other | Admitting: Student in an Organized Health Care Education/Training Program

## 2020-08-28 ENCOUNTER — Other Ambulatory Visit: Payer: Self-pay

## 2020-08-28 DIAGNOSIS — S93491D Sprain of other ligament of right ankle, subsequent encounter: Secondary | ICD-10-CM | POA: Diagnosis not present

## 2020-08-28 NOTE — Progress Notes (Signed)
[facility] Visit  Patient consented to have virtual visit and was identified by name and date of birth. Method of visit: Telephone  Encounter participants: Patient: Meghan Reed - located at work Provider: Richarda Osmond - located at [facility] Others (if applicable): michelle  Chief Complaint: FMLA paperwork  HPI: patient has ankle sprain and phalanx fracture. Seen by sports med and given walking cast. Patient has since transitioned to a croc shoe and able to perform her work duties with some alterations. She takes more time to ambulate around the office and cannot stand/walk for long periods of time like she dd at baseline. Her pain has greatly improved and is no longer taking prescribed pain pills.   ROS: per HPI  Exam:  There were no vitals taken for this visit.  Respiratory: no respiratory dyspnea  Assessment/Plan:  Sprain of anterior talofibular ligament of right ankle We discussed the limitations that would interfere with her work duties and the modifications to her duties that would allow her to keep working in the best fashion possible.  I completed the FMLA paperwork to those specifications including future physical therapy and doctor follow up appointments.  Placed in fax pile. Follow up with [place] as needed. Other care managed by ortho and PT    Time spent during visit with patient: 20 minutes

## 2020-08-29 ENCOUNTER — Encounter: Payer: Medicaid Other | Admitting: Physical Therapy

## 2020-08-31 ENCOUNTER — Other Ambulatory Visit: Payer: Self-pay | Admitting: Student in an Organized Health Care Education/Training Program

## 2020-08-31 NOTE — Assessment & Plan Note (Signed)
We discussed the limitations that would interfere with her work duties and the modifications to her duties that would allow her to keep working in the best fashion possible.  I completed the FMLA paperwork to those specifications including future physical therapy and doctor follow up appointments.  Placed in fax pile. Follow up with [place] as needed. Other care managed by ortho and PT

## 2020-09-08 ENCOUNTER — Other Ambulatory Visit (HOSPITAL_COMMUNITY)
Admission: RE | Admit: 2020-09-08 | Discharge: 2020-09-08 | Disposition: A | Discharge status: Home / Self Care | Payer: Medicaid Other | Source: Ambulatory Visit | Attending: Obstetrics and Gynecology | Admitting: Obstetrics and Gynecology

## 2020-09-08 ENCOUNTER — Encounter: Payer: Self-pay | Admitting: Obstetrics and Gynecology

## 2020-09-08 ENCOUNTER — Ambulatory Visit (INDEPENDENT_AMBULATORY_CARE_PROVIDER_SITE_OTHER): Payer: Medicaid Other | Admitting: Obstetrics and Gynecology

## 2020-09-08 ENCOUNTER — Other Ambulatory Visit: Payer: Self-pay

## 2020-09-08 VITALS — BP 129/85 | HR 92 | Ht 67.0 in | Wt 233.5 lb

## 2020-09-08 DIAGNOSIS — N898 Other specified noninflammatory disorders of vagina: Secondary | ICD-10-CM | POA: Diagnosis not present

## 2020-09-08 DIAGNOSIS — N920 Excessive and frequent menstruation with regular cycle: Secondary | ICD-10-CM | POA: Diagnosis not present

## 2020-09-08 DIAGNOSIS — N939 Abnormal uterine and vaginal bleeding, unspecified: Secondary | ICD-10-CM | POA: Diagnosis not present

## 2020-09-08 MED ORDER — MELOXICAM 15 MG PO TABS: 15.0000 mg | ORAL_TABLET | Freq: Every day | ORAL | 3 refills | Status: DC | PRN

## 2020-09-08 MED ORDER — NORETHINDRONE 0.35 MG PO TABS: 1.0000 | ORAL_TABLET | Freq: Every day | ORAL | 12 refills | Status: DC

## 2020-09-08 NOTE — Progress Notes (Signed)
   History:  [name] is a 34 y.o. [phone] who presents to clinic today for symptoms of pelvic pain, vaginal discharge, heavy menstrual cycle.   Says she came in for similar issues several months ago. Was started on progesterone only OCP for suspected endometriosis.  -also reports having thick white discharge since starting birth control pill. Discharge is not associated with any other vaginal symptoms. Denies vaginal itching, discomfort. Worried that it's BV.  -has not been sexually active. No new soaps. Does not douche.  -also feels pelvic tightness, cramping  -cramping is very bad with periods  -LMP 10/10-10/16. Soreness in lower abdomen. Thick and clear discharge.  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 Also reports history of trying IUD and reports that it migrated     The following portions of the patient's history were reviewed and updated as appropriate: allergies, current medications, family history, past medical history, social history, past surgical history and problem list.  Review of Systems:  Review of Systems  Constitutional: Negative for chills, fever and weight loss.  Cardiovascular: Negative for chest pain.  Gastrointestinal: Positive for abdominal pain. Negative for constipation and diarrhea.  Genitourinary: Negative for dysuria and urgency.  Skin: Negative for rash.  Neurological: Negative for dizziness.      Objective:  Physical Exam There were no vitals taken for this visit. Physical Exam Vitals and nursing note reviewed. Exam conducted with a chaperone present.  Constitutional:      Appearance: Normal appearance.  HENT:     Head: Normocephalic and atraumatic.  Cardiovascular:     Rate and Rhythm: Normal rate and regular rhythm.  Pulmonary:     Effort: Pulmonary effort is normal.  Abdominal:     General: Bowel sounds are normal. There is no distension.     Palpations: Abdomen is  soft. There is no mass.     Tenderness: There is no abdominal tenderness. There is no guarding or rebound.     Hernia: No hernia is present.  Genitourinary:    Cervix: No friability, erythema or cervical bleeding.     Comments: Scant white discharge, no lesions, no foul smell  Neurological:     Mental Status: She is alert.  Psychiatric:        Mood and Affect: Mood normal.        Behavior: Behavior normal.       Labs and Imaging No results found for this or any previous visit (from the past 24 hour(s)).  No results found.   Assessment & Plan:  1. Abnormal uterine bleeding - describes heavy cycles that are very painful. Micronor has helped a little bit. Declined IUD given history. Has history of PE in past so personally do not feel comfortable prescribing estrogen-containing OCP.  -discussed trial of meloxicam prn for pain and continuing micronor -obtain TVUS and follow up in 1-2 months   2. Vaginal discharge -suspect this is physiologic discharge but will r/o other causes. Cervicovag testing obtained.   Janet Berlin, MD 09/08/2020 1:41 PM OB Family Medicine Fellow, [facility] for [employer], [place]

## 2020-09-08 NOTE — Progress Notes (Signed)
BC Pills not helping her with the endometriosis  Feels sore in lower abdomen  Discharge is thick and clear  LMP 10/10-10/16 heavy at beginning of cycle cramping never went away  Wants to be tested for BV States its recurrent after her cycles

## 2020-09-09 LAB — CERVICOVAGINAL ANCILLARY ONLY
Bacterial Vaginitis (gardnerella): NEGATIVE
Candida Glabrata: NEGATIVE
Candida Vaginitis: NEGATIVE
Chlamydia: NEGATIVE
Comment: NEGATIVE
Comment: NEGATIVE
Comment: NEGATIVE
Comment: NEGATIVE
Comment: NEGATIVE
Comment: NORMAL
Neisseria Gonorrhea: NEGATIVE
Trichomonas: NEGATIVE

## 2020-09-11 ENCOUNTER — Ambulatory Visit (INDEPENDENT_AMBULATORY_CARE_PROVIDER_SITE_OTHER): Payer: Medicaid Other | Admitting: Orthopaedic Surgery

## 2020-09-11 ENCOUNTER — Other Ambulatory Visit: Payer: Self-pay

## 2020-09-11 VITALS — BP 120/82 | HR 91

## 2020-09-11 DIAGNOSIS — M4800 Spinal stenosis, site unspecified: Secondary | ICD-10-CM
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
## 2020-09-12 ENCOUNTER — Telehealth: Payer: Self-pay | Admitting: Physical Therapy

## 2020-09-12 ENCOUNTER — Ambulatory Visit: Payer: Medicaid Other | Attending: Orthopaedic Surgery | Admitting: Physical Therapy

## 2020-09-12 NOTE — Telephone Encounter (Signed)
LVM regarding NS today and requested call back to schedule re-eval PRN.  Jama Mcmiller C. Dantonio Justen PT, DPT 09/12/20 11:19 AM

## 2020-09-23 ENCOUNTER — Ambulatory Visit: Payer: Medicaid Other

## 2020-10-01 ENCOUNTER — Encounter: Payer: Self-pay | Admitting: Student in an Organized Health Care Education/Training Program

## 2020-10-09 ENCOUNTER — Other Ambulatory Visit: Payer: Self-pay | Admitting: Student in an Organized Health Care Education/Training Program

## 2020-10-09 DIAGNOSIS — M4800 Spinal stenosis, site unspecified: Secondary | ICD-10-CM

## 2020-10-09 NOTE — Progress Notes (Signed)
Patient requesting further management of her back/leg pain. Referral sent in for sports medicine. She is already seen at [facility] by ortho surgery. Recommended Dr. Junius Roads.

## 2020-10-12 ENCOUNTER — Ambulatory Visit: Payer: Medicaid Other | Admitting: Family Medicine

## 2020-10-12 ENCOUNTER — Other Ambulatory Visit: Payer: Self-pay

## 2020-10-12 ENCOUNTER — Ambulatory Visit (INDEPENDENT_AMBULATORY_CARE_PROVIDER_SITE_OTHER): Payer: Medicaid Other | Admitting: Obstetrics and Gynecology

## 2020-10-12 ENCOUNTER — Encounter: Payer: Self-pay | Admitting: Obstetrics and Gynecology

## 2020-10-12 ENCOUNTER — Encounter: Payer: Self-pay | Admitting: General Practice

## 2020-10-12 ENCOUNTER — Ambulatory Visit
Admission: RE | Admit: 2020-10-12 | Discharge: 2020-10-12 | Disposition: A | Discharge status: Home / Self Care | Payer: Medicaid Other | Source: Ambulatory Visit | Attending: Obstetrics and Gynecology | Admitting: Obstetrics and Gynecology

## 2020-10-12 VITALS — BP 114/73 | HR 75 | Wt 222.2 lb

## 2020-10-12 DIAGNOSIS — R1030 Lower abdominal pain, unspecified: Secondary | ICD-10-CM

## 2020-10-12 DIAGNOSIS — N939 Abnormal uterine and vaginal bleeding, unspecified: Secondary | ICD-10-CM

## 2020-10-12 DIAGNOSIS — N920 Excessive and frequent menstruation with regular cycle: Secondary | ICD-10-CM | POA: Diagnosis not present

## 2020-10-12 NOTE — Progress Notes (Signed)
° °  History:  Meghan Reed is a 34 y.o. [phone] who presents to clinic today for follow up for painful menstrual cycles, heavy bleeding.   Cycle ended on the 6th. Bleeding was better with last cycle. Continues to take micronor. Cramping was still bad. Mobic helped with period cramps and some of other symptoms but doesn't like having to take additional medication daily.   Back pain still bad. On cymbalta, does not think its helping. Did physical therapy which did help a little bit. Had negative experience with orthopedics, hoping to see different orthopedic group. Wonders if back pain is contributing to pelvic pain.  [place] was rescheduled for today so did not have results back at time of appointment.   The following portions of the patient's history were reviewed and updated as appropriate: allergies, current medications, family history, past medical history, social history, past surgical history and problem list.  Review of Systems:  Review of Systems  Constitutional: Negative for chills and fever.  Cardiovascular: Negative for chest pain.  Gastrointestinal: Negative for constipation and diarrhea.  Musculoskeletal: Positive for back pain.  Neurological: Negative for dizziness.      Objective:  Physical Exam BP 114/73    Pulse 75    Wt 222 lb 3.2 oz (100.8 kg)    LMP 10/05/2020 (Exact Date)    BMI 34.80 kg/m  Physical Exam Vitals and nursing note reviewed.  Constitutional:      Appearance: Normal appearance.  HENT:     Head: Normocephalic and atraumatic.  Pulmonary:     Effort: Pulmonary effort is normal.  Abdominal:     Palpations: Abdomen is soft.  Neurological:     Mental Status: She is alert.  Psychiatric:        Mood and Affect: Mood normal.        Behavior: Behavior normal.     Labs and Imaging No results found for this or any previous visit (from the past 24 hour(s)).  No results found.   Assessment & Plan:  1. Menorrhagia with regular  cycle -continue Micronor and Mobic at this time. Will follow up US results. Re-discussed IUD and she is considering, but still has hesitations.   2. Lower abdominal pain - As above.    Janet Berlin, MD 10/12/2020 9:42 AM  OB Family Medicine Fellow, [facility] for [employer], [place]

## 2020-10-14 ENCOUNTER — Ambulatory Visit (INDEPENDENT_AMBULATORY_CARE_PROVIDER_SITE_OTHER): Payer: Medicaid Other | Admitting: Student in an Organized Health Care Education/Training Program

## 2020-10-14 ENCOUNTER — Encounter: Payer: Self-pay | Admitting: Student in an Organized Health Care Education/Training Program

## 2020-10-14 ENCOUNTER — Other Ambulatory Visit: Payer: Self-pay

## 2020-10-14 VITALS — BP 110/80 | HR 99 | Wt 224.4 lb

## 2020-10-14 DIAGNOSIS — Z862 Personal history of diseases of the blood and blood-forming organs and certain disorders involving the immune mechanism: Secondary | ICD-10-CM

## 2020-10-14 DIAGNOSIS — N939 Abnormal uterine and vaginal bleeding, unspecified: Secondary | ICD-10-CM | POA: Diagnosis not present

## 2020-10-14 DIAGNOSIS — Z6835 Body mass index (BMI) 35.0-35.9, adult: Secondary | ICD-10-CM

## 2020-10-14 DIAGNOSIS — R5383 Other fatigue: Secondary | ICD-10-CM | POA: Diagnosis present

## 2020-10-14 DIAGNOSIS — M545 Low back pain, unspecified: Secondary | ICD-10-CM

## 2020-10-14 DIAGNOSIS — E559 Vitamin D deficiency, unspecified: Secondary | ICD-10-CM

## 2020-10-14 DIAGNOSIS — G8929 Other chronic pain: Secondary | ICD-10-CM

## 2020-10-14 DIAGNOSIS — E6609 Other obesity due to excess calories: Secondary | ICD-10-CM

## 2020-10-14 NOTE — Patient Instructions (Signed)
It was a pleasure to see you today!  To summarize our discussion for this visit:  I'm sorry to hear that you are going through a lot right now. Here are some things we are going to work on:  Weight loss- please call Dr. Jenne Campus to set up an appointment for nutrition discussion  Back pain- follow up with sports medicine  Fatigue- we are checking some blood work today and I'll talk to you about it this week. Keep up with your rehab exercises.   Uterine bleeding- follow up with ob/gyn   Call the clinic at [phone] if your symptoms worsen or you have any concerns.   Thank you for allowing me to take part in your care,  Dr. Doristine Mango   Exercising to Lose Weight Exercise is structured, repetitive physical activity to improve fitness and health. Getting regular exercise is important for everyone. It is especially important if you are overweight. Being overweight increases your risk of heart disease, stroke, diabetes, high blood pressure, and several types of cancer. Reducing your calorie intake and exercising can help you lose weight. Exercise is usually categorized as moderate or vigorous intensity. To lose weight, most people need to do a certain amount of moderate-intensity or vigorous-intensity exercise each week. Moderate-intensity exercise  Moderate-intensity exercise is any activity that gets you moving enough to burn at least three times more energy (calories) than if you were sitting. Examples of moderate exercise include:  Walking a mile in 15 minutes.  Doing light yard work.  Biking at an easy pace. Most people should get at least 150 minutes (2 hours and 30 minutes) a week of moderate-intensity exercise to maintain their body weight. Vigorous-intensity exercise Vigorous-intensity exercise is any activity that gets you moving enough to burn at least six times more calories than if you were sitting. When you exercise at this intensity, you should be working hard  enough that you are not able to carry on a conversation. Examples of vigorous exercise include:  Running.  Playing a team sport, such as football, basketball, and soccer.  Jumping rope. Most people should get at least 75 minutes (1 hour and 15 minutes) a week of vigorous-intensity exercise to maintain their body weight. How can exercise affect me? When you exercise enough to burn more calories than you eat, you lose weight. Exercise also reduces body fat and builds muscle. The more muscle you have, the more calories you burn. Exercise also:  Improves mood.  Reduces stress and tension.  Improves your overall fitness, flexibility, and endurance.  Increases bone strength. The amount of exercise you need to lose weight depends on:  Your age.  The type of exercise.  Any health conditions you have.  Your overall physical ability. Talk to your health care provider about how much exercise you need and what types of activities are safe for you. What actions can I take to lose weight? Nutrition   Make changes to your diet as told by your health care provider or diet and nutrition specialist (dietitian). This may include: ? Eating fewer calories. ? Eating more protein. ? Eating less unhealthy fats. ? Eating a diet that includes fresh fruits and vegetables, whole grains, low-fat dairy products, and lean protein. ? Avoiding foods with added fat, salt, and sugar.  Drink plenty of water while you exercise to prevent dehydration or heat stroke. Activity  Choose an activity that you enjoy and set realistic goals. Your health care provider can help you make an exercise plan  that works for you.  Exercise at a moderate or vigorous intensity most days of the week. ? The intensity of exercise may vary from person to person. You can tell how intense a workout is for you by paying attention to your breathing and heartbeat. Most people will notice their breathing and heartbeat get faster with  more intense exercise.  Do resistance training twice each week, such as: ? Push-ups. ? Sit-ups. ? Lifting weights. ? Using resistance bands.  Getting short amounts of exercise can be just as helpful as long structured periods of exercise. If you have trouble finding time to exercise, try to include exercise in your daily routine. ? Get up, stretch, and walk around every 30 minutes throughout the day. ? Go for a walk during your lunch break. ? Park your car farther away from your destination. ? If you take public transportation, get off one stop early and walk the rest of the way. ? Make phone calls while standing up and walking around. ? Take the stairs instead of elevators or escalators.  Wear comfortable clothes and shoes with good support.  Do not exercise so much that you hurt yourself, feel dizzy, or get very short of breath. Where to find more information  U.S. Department of Health and Human Services: [employer].at  Centers for Disease Control and Prevention (CDC): http://www.wolf.info/ Contact a health care provider:  Before starting a new exercise program.  If you have questions or concerns about your weight.  If you have a medical problem that keeps you from exercising. Get help right away if you have any of the following while exercising:  Injury.  Dizziness.  Difficulty breathing or shortness of breath that does not go away when you stop exercising.  Chest pain.  Rapid heartbeat. Summary  Being overweight increases your risk of heart disease, stroke, diabetes, high blood pressure, and several types of cancer.  Losing weight happens when you burn more calories than you eat.  Reducing the amount of calories you eat in addition to getting regular moderate or vigorous exercise each week helps you lose weight. This information is not intended to replace advice given to you by your health care provider. Make sure you discuss any questions you have with your health care  provider. Document Revised: 10/30/2017 Document Reviewed: 10/30/2017 Elsevier Patient Education  2020 [employer].

## 2020-10-14 NOTE — Progress Notes (Signed)
° ° °  SUBJECTIVE:   CHIEF COMPLAINT / HPI: back pain f/u  Back pain-patient following up again today for back pain which is due to arthritis and disc herniation as demonstrated by MRI.  She has been seen by Ortho surgery who recommended weight loss and putting off surgery.  She is scheduled to see a sports medicine physician towards the end of this month to discuss other options for treating her pain and giving her more function.  She is currently taking muscle relaxers, gabapentin, Cymbalta and is not sure which of these medications help her the most orbital.  Obesity- interested in weight loss to help with her back/joint pain. Would like to speak with our nutritionist and discuss other weight loss strategies.  Fatigue- feels tired all the time for unknown reason. She does have poor sleep habits and menorrhagia. Snores at night.   Abnormal uterine bleeding- following with Ob/gyn. Recently had US done  OBJECTIVE:   BP 110/80    Pulse 99    Wt 101.8 kg    LMP 10/05/2020 (Exact Date)    SpO2 98%    BMI 35.15 kg/m   General: NAD, pleasant, able to participate in exam Extremities: no edema. WWP. Skin: warm and dry, no rashes noted Neuro: alert and oriented, no focal deficits Psych: Normal affect and mood  ASSESSMENT/PLAN:   Fatigue Collecting labs today to evaluate a cause  Back pain Due to spinal stenosis, disc herniation and arthritis - patient to follow up with sports medicine this month - continue to do stretches from PT - weight loss - continue pain management  Obesity Difficulty with weight loss since birth of child. Contributing to back/joint pain. Patient interested in weight loss supplements as well as programs, nutrition counseling and bariatric surgery - gave patient card for nutritionist - obtain basic metabolic labs today - discussed lifestyle changes for weight loss - at f/u can discuss referral for healthy weight and wellness and supplemental medications  Abnormal  uterine bleeding Discussed US findings and encouraged f/u with ob/gyn  Vitamin D deficiency 13.0 vitamin D. Filled 50,000u weekly x7 weeks    Richarda Osmond, [place]

## 2020-10-15 DIAGNOSIS — E559 Vitamin D deficiency, unspecified: Secondary | ICD-10-CM | POA: Insufficient documentation

## 2020-10-15 DIAGNOSIS — E669 Obesity, unspecified: Secondary | ICD-10-CM | POA: Insufficient documentation

## 2020-10-15 DIAGNOSIS — N939 Abnormal uterine and vaginal bleeding, unspecified: Secondary | ICD-10-CM | POA: Insufficient documentation

## 2020-10-15 DIAGNOSIS — N938 Other specified abnormal uterine and vaginal bleeding: Secondary | ICD-10-CM | POA: Insufficient documentation

## 2020-10-15 DIAGNOSIS — M549 Dorsalgia, unspecified: Secondary | ICD-10-CM | POA: Insufficient documentation

## 2020-10-15 LAB — TSH: TSH: 0.89 u[IU]/mL (ref 0.450–4.500)

## 2020-10-15 LAB — CBC
Hematocrit: 38.1 % (ref 34.0–46.6)
Hemoglobin: 13.1 g/dL (ref 11.1–15.9)
MCH: 30.1 pg (ref 26.6–33.0)
MCHC: 34.4 g/dL (ref 31.5–35.7)
MCV: 88 fL (ref 79–97)
Platelets: 272 10*3/uL (ref 150–450)
RBC: 4.35 x10E6/uL (ref 3.77–5.28)
RDW: 12.3 % (ref 11.7–15.4)
WBC: 8.2 10*3/uL (ref 3.4–10.8)

## 2020-10-15 LAB — BASIC METABOLIC PANEL
BUN/Creatinine Ratio: 12 (ref 9–23)
BUN: 9 mg/dL (ref 6–20)
CO2: 26 mmol/L (ref 20–29)
Calcium: 10 mg/dL (ref 8.7–10.2)
Chloride: 100 mmol/L (ref 96–106)
Creatinine, Ser: 0.78 mg/dL (ref 0.57–1.00)
GFR calc Af Amer: 114 mL/min/{1.73_m2} (ref >59)
GFR calc non Af Amer: 99 mL/min/{1.73_m2} (ref >59)
Glucose: 86 mg/dL (ref 65–99)
Potassium: 4.1 mmol/L (ref 3.5–5.2)
Sodium: 140 mmol/L (ref 134–144)

## 2020-10-15 LAB — VITAMIN B12: Vitamin B-12: 394 pg/mL (ref 232–1245)

## 2020-10-15 LAB — VITAMIN D 25 HYDROXY (VIT D DEFICIENCY, FRACTURES): Vit D, 25-Hydroxy: 13 ng/mL — ABNORMAL LOW (ref 30.0–100.0)

## 2020-10-15 MED ORDER — VITAMIN D (ERGOCALCIFEROL) 1.25 MG (50000 UNIT) PO CAPS
50000.0000 [IU] | ORAL_CAPSULE | ORAL | 0 refills | Status: AC
Start: 2020-10-15 — End: 2020-11-27

## 2020-10-15 NOTE — Assessment & Plan Note (Signed)
Collecting labs today to evaluate a cause

## 2020-10-15 NOTE — Assessment & Plan Note (Signed)
Discussed US findings and encouraged f/u with ob/gyn

## 2020-10-15 NOTE — Assessment & Plan Note (Signed)
Difficulty with weight loss since birth of child. Contributing to back/joint pain. Patient interested in weight loss supplements as well as programs, nutrition counseling and bariatric surgery - gave patient card for nutritionist - obtain basic metabolic labs today - discussed lifestyle changes for weight loss - at f/u can discuss referral for healthy weight and wellness and supplemental medications

## 2020-10-15 NOTE — Assessment & Plan Note (Signed)
13.0 vitamin D. Filled 50,000u weekly x7 weeks

## 2020-10-15 NOTE — Assessment & Plan Note (Addendum)
Due to spinal stenosis, disc herniation and arthritis - patient to follow up with sports medicine this month - continue to do stretches from PT - weight loss - continue pain management

## 2020-10-20 ENCOUNTER — Telehealth: Payer: Self-pay | Admitting: Obstetrics and Gynecology

## 2020-10-20 NOTE — Telephone Encounter (Signed)
Patient was very upset because it's been a week and she has not gotten a call back. She stated someone was suppose to reach out to her about her results from her ultrasound. She is expecting a call back today because she should have received a call last week.

## 2020-10-20 NOTE — Telephone Encounter (Signed)
I returned pt's call and stated that the clinical staff calls patients or sends a Mychart message regarding results based on the doctor's request. We have not received a message from Dr. Berniece Andreas. Pt stated that she has seen the result in Mychart and wants to know the next steps in her care. I stated that I will send a message to Dr. Berniece Andreas regarding pt's concerns. She can expect a response from either the doctor or our clinical staff very soon. Pt voiced understanding.

## 2020-10-22 ENCOUNTER — Ambulatory Visit: Payer: Medicaid Other

## 2020-10-29 ENCOUNTER — Ambulatory Visit (INDEPENDENT_AMBULATORY_CARE_PROVIDER_SITE_OTHER): Payer: Medicaid Other | Admitting: Family Medicine

## 2020-10-29 ENCOUNTER — Other Ambulatory Visit: Payer: Self-pay

## 2020-10-29 VITALS — BP 105/64 | Ht 66.0 in | Wt 218.0 lb

## 2020-10-29 DIAGNOSIS — M357 Hypermobility syndrome: Secondary | ICD-10-CM | POA: Diagnosis not present

## 2020-10-29 DIAGNOSIS — M2559 Pain in other specified joint: Secondary | ICD-10-CM

## 2020-10-29 DIAGNOSIS — M5412 Radiculopathy, cervical region: Secondary | ICD-10-CM | POA: Diagnosis not present

## 2020-10-29 DIAGNOSIS — G8929 Other chronic pain: Secondary | ICD-10-CM | POA: Diagnosis not present

## 2020-10-29 DIAGNOSIS — M545 Low back pain, unspecified: Secondary | ICD-10-CM | POA: Diagnosis not present

## 2020-10-29 DIAGNOSIS — M2550 Pain in unspecified joint: Secondary | ICD-10-CM | POA: Insufficient documentation

## 2020-10-29 NOTE — Patient Instructions (Signed)
Nice to meet you  Please continue heat  Please try the exercises  I will call with the results from today  Please try compression  Please send me a message in MyChart with any questions or updates.  Please see me back in 4 weeks.   --Dr. Jordan Likes

## 2020-10-29 NOTE — Assessment & Plan Note (Signed)
Acute on chronic in nature.  Does have retrolisthesis from the MRI done in 2018. - counseled on home exercise therapy and supportive care - counseled on compression  - could consider facet injections.

## 2020-10-29 NOTE — Assessment & Plan Note (Signed)
Seems to have a component of hypermobility that may be contributing to her ongoing pain and especially after pregnancy. She does also have migraines.  - could consider amitriptyline or nortriptyline.
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
## 2020-10-29 NOTE — Assessment & Plan Note (Addendum)
Acute on chronic in nature.  - Sed rate, crp, ana panel, iron and ferritin, ck

## 2020-10-29 NOTE — Assessment & Plan Note (Signed)
Previous MRI showing loss of the cervical spine lordosis. Has been ongoing for years. May have component of hypermobility that is leading to her symptoms.  - counseled on home exercise therapy and supportive care - she will get CD's of MRI  - could consider epidural or EMG

## 2020-10-31 DIAGNOSIS — G8929 Other chronic pain: Secondary | ICD-10-CM

## 2020-10-31 HISTORY — DX: Other chronic pain: G89.29

## 2020-11-02 ENCOUNTER — Telehealth: Payer: Self-pay | Admitting: Family Medicine

## 2020-11-02 DIAGNOSIS — M5412 Radiculopathy, cervical region: Secondary | ICD-10-CM

## 2020-11-02 LAB — CK: Total CK: 128 U/L (ref 32–182)

## 2020-11-02 LAB — IRON,TIBC AND FERRITIN PANEL
Ferritin: 44 ng/mL (ref 15–150)
Iron Saturation: 21 % (ref 15–55)
Iron: 66 ug/dL (ref 27–159)
Total Iron Binding Capacity: 318 ug/dL (ref 250–450)
UIBC: 252 ug/dL (ref 131–425)

## 2020-11-02 LAB — ANA,IFA RA DIAG PNL W/RFLX TIT/PATN
ANA Titer 1: NEGATIVE
Cyclic Citrullin Peptide Ab: 3 units (ref 0–19)
Rheumatoid fact SerPl-aCnc: <10 IU/mL (ref <14.0)

## 2020-11-02 LAB — C-REACTIVE PROTEIN: CRP: 9 mg/L (ref 0–10)

## 2020-11-02 LAB — SEDIMENTATION RATE: Sed Rate: 4 mm/hr (ref 0–32)

## 2020-11-02 NOTE — Telephone Encounter (Signed)
Informed of results. She will obtain previous MRI of the lumbar spine. Can consider facet injections or SI joint injections. Also having altered sensation upper extremity. Cervical MRI has been done sometime ago with no stenosis. Will obtain EMG to evaluate for any peripheral neuropathies.  Myra Rude, MD [facility] 11/02/2020, 3:30 PM

## 2020-11-10 ENCOUNTER — Other Ambulatory Visit: Payer: Self-pay

## 2020-11-10 ENCOUNTER — Encounter: Payer: Self-pay | Admitting: Emergency Medicine

## 2020-11-10 ENCOUNTER — Ambulatory Visit
Admission: EM | Admit: 2020-11-10 | Discharge: 2020-11-10 | Disposition: A | Discharge status: Home / Self Care | Payer: Medicaid Other | Attending: Family Medicine | Admitting: Family Medicine

## 2020-11-10 DIAGNOSIS — U071 COVID-19: Secondary | ICD-10-CM

## 2020-11-10 DIAGNOSIS — J069 Acute upper respiratory infection, unspecified: Secondary | ICD-10-CM

## 2020-11-10 DIAGNOSIS — Z20822 Contact with and (suspected) exposure to covid-19: Secondary | ICD-10-CM

## 2020-11-10 HISTORY — DX: COVID-19: U07.1

## 2020-11-10 MED ORDER — GUAIFENESIN 100 MG/5ML PO SOLN
5.0000 mL | ORAL | 0 refills | Status: DC | PRN
Start: 2020-11-10 — End: 2021-05-31

## 2020-11-10 MED ORDER — ALBUTEROL SULFATE HFA 108 (90 BASE) MCG/ACT IN AERS
2.0000 | INHALATION_SPRAY | RESPIRATORY_TRACT | 0 refills | Status: DC | PRN
Start: 2020-11-10 — End: 2022-12-12

## 2020-11-10 MED ORDER — BENZONATATE 100 MG PO CAPS
100.0000 mg | ORAL_CAPSULE | Freq: Four times a day (QID) | ORAL | 0 refills | Status: DC | PRN
Start: 2020-11-10 — End: 2021-01-11

## 2020-11-10 NOTE — Discharge Instructions (Signed)
We are testing you for covid again because I think your first test could have been a false negative.    I will refill your albuterol inhaler and prescribe some cough medicine.  Please keep the cough medicine out of the reach of children.
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
## 2020-11-10 NOTE — ED Triage Notes (Signed)
Pt here for cough and increased asthma sx x 5 days; pt sts negative covid test through health at work; pt sts out of inhaler

## 2020-11-11 LAB — SARS-COV-2, NAA 2 DAY TAT

## 2020-11-11 LAB — NOVEL CORONAVIRUS, NAA: SARS-CoV-2, NAA: DETECTED — AB

## 2020-11-16 ENCOUNTER — Ambulatory Visit: Payer: Medicaid Other

## 2020-11-16 ENCOUNTER — Telehealth (INDEPENDENT_AMBULATORY_CARE_PROVIDER_SITE_OTHER): Payer: Medicaid Other | Admitting: Family Medicine

## 2020-11-16 DIAGNOSIS — U071 COVID-19: Secondary | ICD-10-CM | POA: Insufficient documentation

## 2020-11-16 NOTE — Assessment & Plan Note (Addendum)
35 yo F patient with 13 days of sx if COVID-19. Patient is slowly improving as expected on day 13 of viral illness. Discussed expectations regarding cough, which may last ~6 more weeks. For congestion, recommend flonase qd x4 weeks, and she can use phenylephrine products for 2-4 days for quick relief, cautioned on continued use, anxiety, and palpitations with phenylephrine products. RTC if develops SOB, wheezing despite albuterol MDI, unable to tolerate PO.

## 2020-11-16 NOTE — Progress Notes (Signed)
[facility] Tele[facility]icine Visit  Patient consented to have virtual visit and was identified by name and date of birth. Method of visit: Video  Encounter participants: Patient: Meghan Reed - located at home Provider: Gladys Damme - located at home  Chief Complaint: cough, COVID-19  HPI: 35 yo F patient with COVID-19, tested positive on 11/10/20, but has had sx since November 04, 2020. Her son tested positive around that time. She reports fever, cough, myalgia, congestion, and head aches. Patient has asthma most active in winter. She tried using her albuterol inhaler for wheezing, but did not have expected results and then realized her MDI was expired. She went to ED on 11/10/20 and received tessalon perles, guaifenisin, and albuterol MDI refill. She reports decreased wheezing with MDI now. She also reports no further fevers, improvement in cough, but still congestion. She is able to tolerate PO, no CP, SOB. She is now out of infectious time period at Day 13 of illness.   ROS: per HPI  Pertinent PMHx: asthma  Exam:  There were no vitals taken for this visit.  Respiratory: speaking in full sentences, no audible wheezing  Assessment/Plan:  COVID-19 35 yo F patient with 13 days of sx if COVID-19. Patient is slowly improving as expected on day 13 of viral illness. Discussed expectations regarding cough, which may last ~6 more weeks. For congestion, recommend flonase qd x4 weeks, and she can use phenylephrine products for 2-4 days for quick relief, cautioned on continued use, anxiety, and palpitations with phenylephrine products. RTC if develops SOB, wheezing despite albuterol MDI, unable to tolerate PO.    Time spent during visit with patient: 13 minutes

## 2020-11-19 ENCOUNTER — Ambulatory Visit: Payer: Medicaid Other | Admitting: Obstetrics & Gynecology

## 2020-11-30 ENCOUNTER — Ambulatory Visit: Payer: Medicaid Other | Admitting: Family Medicine

## 2020-12-02 ENCOUNTER — Ambulatory Visit: Payer: Medicaid Other | Admitting: Family Medicine
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
## 2020-12-03 ENCOUNTER — Encounter: Payer: Self-pay | Admitting: Obstetrics & Gynecology

## 2020-12-03 ENCOUNTER — Ambulatory Visit (INDEPENDENT_AMBULATORY_CARE_PROVIDER_SITE_OTHER): Payer: Medicaid Other | Admitting: Obstetrics & Gynecology

## 2020-12-03 ENCOUNTER — Other Ambulatory Visit: Payer: Self-pay

## 2020-12-03 VITALS — BP 107/78 | HR 88 | Ht 66.0 in | Wt 220.0 lb

## 2020-12-03 DIAGNOSIS — N800 Endometriosis of uterus: Secondary | ICD-10-CM | POA: Diagnosis not present

## 2020-12-03 DIAGNOSIS — R102 Pelvic and perineal pain: Secondary | ICD-10-CM | POA: Diagnosis not present

## 2020-12-03 DIAGNOSIS — N8003 Adenomyosis of the uterus: Secondary | ICD-10-CM

## 2020-12-03 MED ORDER — ORILISSA 200 MG PO TABS: 1.0000 | ORAL_TABLET | Freq: Two times a day (BID) | ORAL | 6 refills | Status: DC
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
## 2020-12-03 NOTE — Patient Instructions (Signed)
Pelvic Pain, Female Pelvic pain is pain in your lower belly (abdomen), below your belly button and between your hips. The pain may start suddenly (be acute), keep coming back (be recurring), or last a long time (become chronic). Pelvic pain that lasts longer than 6 months is called chronic pelvic pain. There are many causes of pelvic pain. Sometimes the cause of pelvic pain is not known. Follow these instructions at home:  Take over-the-counter and prescription medicines only as told by your doctor.  Rest as told by your doctor.  Do not have sex if it hurts.  Keep a journal of your pelvic pain. Write down: ? When the pain started. ? Where the pain is located. ? What seems to make the pain better or worse, such as food or your period (menstrual cycle). ? Any symptoms you have along with the pain.  Keep all follow-up visits as told by your doctor. This is important.   Contact a doctor if:  Medicine does not help your pain.  Your pain comes back.  You have new symptoms.  You have unusual discharge or bleeding from your vagina.  You have a fever or chills.  You are having trouble pooping (constipation).  You have blood in your pee (urine) or poop (stool).  Your pee smells bad.  You feel weak or light-headed. Get help right away if:  You have sudden pain that is very bad.  Your pain keeps getting worse.  You have very bad pain and also have any of these symptoms: ? A fever. ? Feeling sick to your stomach (nausea). ? Throwing up (vomiting). ? Being very sweaty.  You pass out (lose consciousness). Summary  Pelvic pain is pain in your lower belly (abdomen), below your belly button and between your hips.  There are many possible causes of pelvic pain.  Keep a journal of your pelvic pain. This information is not intended to replace advice given to you by your health care provider. Make sure you discuss any questions you have with your health care provider. Document  Revised: 04/04/2018 Document Reviewed: 04/04/2018 Elsevier Patient Education  [place].

## 2021-01-11 ENCOUNTER — Ambulatory Visit: Payer: Medicaid Other | Admitting: Neurology

## 2021-01-11 ENCOUNTER — Encounter: Payer: Self-pay | Admitting: Neurology

## 2021-01-11 ENCOUNTER — Other Ambulatory Visit: Payer: Self-pay

## 2021-01-11 VITALS — BP 112/72 | HR 77 | Ht 66.0 in | Wt 223.0 lb

## 2021-01-11 DIAGNOSIS — R200 Anesthesia of skin: Secondary | ICD-10-CM

## 2021-01-11 DIAGNOSIS — R202 Paresthesia of skin: Secondary | ICD-10-CM | POA: Diagnosis not present

## 2021-01-11 DIAGNOSIS — M79602 Pain in left arm: Secondary | ICD-10-CM | POA: Diagnosis not present

## 2021-01-11 DIAGNOSIS — M79601 Pain in right arm: Secondary | ICD-10-CM | POA: Diagnosis not present

## 2021-01-11 NOTE — Patient Instructions (Signed)
Electromyoneurogram Electromyoneurogram is a test to check how well your muscles and nerves are working. This procedure includes the combined use of electromyogram (EMG) and nerve conduction study (NCS). EMG is used to look for muscular disorders. NCS, which is also called electroneurogram, measures how well your nerves are controlling your muscles. The procedures are usually done together to check if your muscles and nerves are healthy. If the results of the tests are abnormal, this may indicate disease or injury, such as a neuromuscular disease or peripheral nerve damage. Tell a health care provider about:  Any allergies you have.  All medicines you are taking, including vitamins, herbs, eye drops, creams, and over-the-counter medicines.  Any problems you or family members have had with anesthetic medicines.  Any blood disorders you have.  Any surgeries you have had.  Any medical conditions you have.  If you have a pacemaker.  Whether you are pregnant or may be pregnant. What are the risks? Generally, this is a safe procedure. However, problems may occur, including:  Infection where the electrodes were inserted.  Bleeding. What happens before the procedure? Medicines Ask your health care provider about:  Changing or stopping your regular medicines. This is especially important if you are taking diabetes medicines or blood thinners.  Taking medicines such as aspirin and ibuprofen. These medicines can thin your blood. Do not take these medicines unless your health care provider tells you to take them.  Taking over-the-counter medicines, vitamins, herbs, and supplements. General instructions  Your health care provider may ask you to avoid: ? Beverages that have caffeine, such as coffee and tea. ? Any products that contain nicotine or tobacco. These products include cigarettes, e-cigarettes, and chewing tobacco. If you need help quitting, ask your health care provider.  Do not  use lotions or creams on the same day that you will be having the procedure. What happens during the procedure? For EMG  Your health care provider will ask you to stay in a position so that he or she can access the muscle that will be studied. You may be standing, sitting, or lying down.  You may be given a medicine that numbs the area (local anesthetic).  A very thin needle that has an electrode will be inserted into your muscle.  Another small electrode will be placed on your skin near the muscle.  Your health care provider will ask you to continue to remain still.  The electrodes will send a signal that tells about the electrical activity of your muscles. You may see this on a monitor or hear it in the room.  After your muscles have been studied at rest, your health care provider will ask you to contract or flex your muscles. The electrodes will send a signal that tells about the electrical activity of your muscles.  Your health care provider will remove the electrodes and the electrode needles when the procedure is finished. The procedure may vary among health care providers and hospitals.   For NCS  An electrode that records your nerve activity (recording electrode) will be placed on your skin by the muscle that is being studied.  An electrode that is used as a reference (reference electrode) will be placed near the recording electrode.  A paste or gel will be applied to your skin between the recording electrode and the reference electrode.  Your nerve will be stimulated with a mild shock. Your health care provider will measure how much time it takes for your muscle to react.  Your health care provider will remove the electrodes and the gel when the procedure is finished. The procedure may vary among health care providers and hospitals.   What happens after the procedure?  It is up to you to get the results of your procedure. Ask your health care provider, or the department  that is doing the procedure, when your results will be ready.  Your health care provider may: ? Give you medicines for any pain. ? Monitor the insertion sites to make sure that bleeding stops. Summary  Electromyoneurogram is a test to check how well your muscles and nerves are working.  If the results of the tests are abnormal, this may indicate disease or injury.  This is a safe procedure. However, problems may occur, such as bleeding and infection.  Your health care provider will do two tests to complete this procedure. One checks your muscles (EMG) and another checks your nerves (NCS).  It is up to you to get the results of your procedure. Ask your health care provider, or the department that is doing the procedure, when your results will be ready. This information is not intended to replace advice given to you by your health care provider. Make sure you discuss any questions you have with your health care provider. Document Revised: 07/03/2018 Document Reviewed: 06/15/2018 Elsevier Patient Education  [place].
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
## 2021-01-27 NOTE — Progress Notes (Signed)
Full Name: Meghan Reed Gender: Female MRN #: [id number] Date of Birth: 22-Sep-1986    Visit Date: 01/28/2021 07:34 Age: 35 Years Examining Physician: Sarina Ill, MD  Referring Physician: Clearance Coots, MD    History: Patient with chronic neck pain and radiation into the right> left arm Summary: EMG/NCS was performed on the upper extremities. All nerves and muscles (as indicated in the following tables) were within normal limits.    Conclusion: This is a normal study  Sarina Ill, M.D.  [facility] [address], [place], [place] [address] Tel: [phone] Fax: [phone]  Verbal informed consent was obtained from the patient, patient was informed of potential risk of procedure, including bruising, bleeding, hematoma formation, infection, muscle weakness, muscle pain, numbness, among others.        [place]    Nerve / Sites Muscle Latency Ref. Amplitude Ref. Rel Amp Segments Distance Velocity Ref. Area    ms ms mV mV %  cm m/s m/s mVms  R Median - APB     Wrist APB 3.5 ?4.4 9.6 ?4.0 100 Wrist - APB 7   32.9     Upper arm APB 7.3  9.6  99.4 Upper arm - Wrist 22 57 ?49 32.3  L Median - APB     Wrist APB 3.5 ?4.4 9.7 ?4.0 100 Wrist - APB 7   31.7     Upper arm APB 7.1  8.9  92.6 Upper arm - Wrist 21 57 ?49 31.8  R Ulnar - ADM     Wrist ADM 2.7 ?3.3 11.6 ?6.0 100 Wrist - ADM 7   30.9     B.Elbow ADM 5.4  10.9  94.2 B.Elbow - Wrist 19 70 ?49 29.6     A.Elbow ADM 6.9  9.9  90.5 A.Elbow - B.Elbow 10 70 ?49 27.2  L Ulnar - ADM     Wrist ADM 2.4 ?3.3 9.0 ?6.0 100 Wrist - ADM 7   24.4     B.Elbow ADM 5.1  7.9  87.9 B.Elbow - Wrist 18 68 ?49 24.5     A.Elbow ADM 6.6  7.2  90.7 A.Elbow - B.Elbow 10 67 ?49 22.2             SNC    Nerve / Sites Rec. Site Peak Lat Ref.  Amp Ref. Segments Distance Peak Diff Ref.    ms ms V V  cm ms ms  R Median, Ulnar - Transcarpal comparison     Median Palm Wrist 1.9 ?2.2 102 ?35 Median Palm - Wrist 8        Ulnar Palm Wrist 1.8 ?2.2 28 ?12 Ulnar Palm - Wrist 8          Median Palm - Ulnar Palm  0.1 ?0.4  L Median, Ulnar - Transcarpal comparison     Median Palm Wrist 2.0 ?2.2 96 ?35 Median Palm - Wrist 8       Ulnar Palm Wrist 1.8 ?2.2 42 ?12 Ulnar Palm - Wrist 8          Median Palm - Ulnar Palm  0.3 ?0.4  R Median - Orthodromic (Dig II, Mid palm)     Dig II Wrist 2.7 ?3.4 21 ?10 Dig II - Wrist 13    L Median - Orthodromic (Dig II, Mid palm)     Dig II Wrist 2.9 ?3.4 18 ?10 Dig II - Wrist 13    R Ulnar - Orthodromic, (Dig V,  Mid palm)     Dig V Wrist 2.4 ?3.1 10 ?5 Dig V - Wrist 11    L Ulnar - Orthodromic, (Dig V, Mid palm)     Dig V Wrist 2.5 ?3.1 13 ?5 Dig V - Wrist 45                   F  Wave    Nerve F Lat Ref.   ms ms  R Ulnar - ADM 25.9 ?32.0  L Ulnar - ADM 26.0 ?32.0         EMG Summary Table    Spontaneous MUAP Recruitment  Muscle IA Fib PSW Fasc Other Amp Dur. Poly Pattern  L. Deltoid Normal None None None _______ Normal Normal Normal Normal  R. Deltoid Normal None None None _______ Normal Normal Normal Normal  L. Triceps brachii Normal None None None _______ Normal Normal Normal Normal  R. Triceps brachii Normal None None None _______ Normal Normal Normal Normal  L. Pronator teres Normal None None None _______ Normal Normal Normal Normal  R. Pronator teres Normal None None None _______ Normal Normal Normal Normal  L. Opponens pollicis Normal None None None _______ Normal Normal Normal Normal  R. Opponens pollicis Normal None None None _______ Normal Normal Normal Normal  L. First dorsal interosseous Normal None None None _______ Normal Normal Normal Normal  R. First dorsal interosseous Normal None None None _______ Normal Normal Normal Normal  L. Cervical paraspinals (low) Normal None None None _______ Normal Normal Normal Normal  R. Cervical paraspinals (low) Normal None None None _______ Normal Normal Normal Normal

## 2021-01-28 ENCOUNTER — Ambulatory Visit: Payer: Medicaid Other | Admitting: Neurology

## 2021-01-28 ENCOUNTER — Ambulatory Visit (INDEPENDENT_AMBULATORY_CARE_PROVIDER_SITE_OTHER): Payer: Medicaid Other | Admitting: Neurology

## 2021-01-28 ENCOUNTER — Telehealth: Payer: Self-pay | Admitting: Neurology

## 2021-01-28 DIAGNOSIS — M79602 Pain in left arm: Secondary | ICD-10-CM

## 2021-01-28 DIAGNOSIS — R200 Anesthesia of skin: Secondary | ICD-10-CM

## 2021-01-28 DIAGNOSIS — M79601 Pain in right arm: Secondary | ICD-10-CM | POA: Diagnosis not present

## 2021-01-28 DIAGNOSIS — G8929 Other chronic pain: Secondary | ICD-10-CM

## 2021-01-28 DIAGNOSIS — Z0289 Encounter for other administrative examinations: Secondary | ICD-10-CM

## 2021-01-28 DIAGNOSIS — R29898 Other symptoms and signs involving the musculoskeletal system: Secondary | ICD-10-CM | POA: Diagnosis not present

## 2021-01-28 DIAGNOSIS — R202 Paresthesia of skin: Secondary | ICD-10-CM

## 2021-01-28 DIAGNOSIS — M542 Cervicalgia: Secondary | ICD-10-CM | POA: Diagnosis not present

## 2021-01-28 DIAGNOSIS — M5412 Radiculopathy, cervical region: Secondary | ICD-10-CM

## 2021-01-28 NOTE — Progress Notes (Signed)
See procedure note.

## 2021-01-28 NOTE — Telephone Encounter (Signed)
MCD [employer] pending faxed notes

## 2021-01-28 NOTE — Procedures (Signed)
Full Name: Meghan Reed Gender: Female MRN #: [id number] Date of Birth: [birth year]    Visit Date: 01/28/2021 07:34 Age: 35 Years Examining Physician: Sarina Ill, MD  Referring Physician: Clearance Coots, MD    History: Patient with chronic neck pain and radiation into the right> left arm Summary: EMG/NCS was performed on the upper extremities. All nerves and muscles (as indicated in the following tables) were within normal limits.    Conclusion: This is a normal study  Sarina Ill, M.D.  [facility] [address], [place], [place] [address] Tel: [phone] Fax: [phone]  Verbal informed consent was obtained from the patient, patient was informed of potential risk of procedure, including bruising, bleeding, hematoma formation, infection, muscle weakness, muscle pain, numbness, among others.        [place]    Nerve / Sites Muscle Latency Ref. Amplitude Ref. Rel Amp Segments Distance Velocity Ref. Area    ms ms mV mV %  cm m/s m/s mVms  R Median - APB     Wrist APB 3.5 ?4.4 9.6 ?4.0 100 Wrist - APB 7   32.9     Upper arm APB 7.3  9.6  99.4 Upper arm - Wrist 22 57 ?49 32.3  L Median - APB     Wrist APB 3.5 ?4.4 9.7 ?4.0 100 Wrist - APB 7   31.7     Upper arm APB 7.1  8.9  92.6 Upper arm - Wrist 21 57 ?49 31.8  R Ulnar - ADM     Wrist ADM 2.7 ?3.3 11.6 ?6.0 100 Wrist - ADM 7   30.9     B.Elbow ADM 5.4  10.9  94.2 B.Elbow - Wrist 19 70 ?49 29.6     A.Elbow ADM 6.9  9.9  90.5 A.Elbow - B.Elbow 10 70 ?49 27.2  L Ulnar - ADM     Wrist ADM 2.4 ?3.3 9.0 ?6.0 100 Wrist - ADM 7   24.4     B.Elbow ADM 5.1  7.9  87.9 B.Elbow - Wrist 18 68 ?49 24.5     A.Elbow ADM 6.6  7.2  90.7 A.Elbow - B.Elbow 10 67 ?49 22.2             SNC    Nerve / Sites Rec. Site Peak Lat Ref.  Amp Ref. Segments Distance Peak Diff Ref.    ms ms V V  cm ms ms  R Median, Ulnar - Transcarpal comparison     Median Palm Wrist 1.9 ?2.2 102 ?35 Median Palm - Wrist 8        Ulnar Palm Wrist 1.8 ?2.2 28 ?12 Ulnar Palm - Wrist 8          Median Palm - Ulnar Palm  0.1 ?0.4  L Median, Ulnar - Transcarpal comparison     Median Palm Wrist 2.0 ?2.2 96 ?35 Median Palm - Wrist 8       Ulnar Palm Wrist 1.8 ?2.2 42 ?12 Ulnar Palm - Wrist 8          Median Palm - Ulnar Palm  0.3 ?0.4  R Median - Orthodromic (Dig II, Mid palm)     Dig II Wrist 2.7 ?3.4 21 ?10 Dig II - Wrist 13    L Median - Orthodromic (Dig II, Mid palm)     Dig II Wrist 2.9 ?3.4 18 ?10 Dig II - Wrist 13    R Ulnar - Orthodromic, (Dig V,  Mid palm)     Dig V Wrist 2.4 ?3.1 10 ?5 Dig V - Wrist 11    L Ulnar - Orthodromic, (Dig V, Mid palm)     Dig V Wrist 2.5 ?3.1 13 ?5 Dig V - Wrist 67                   F  Wave    Nerve F Lat Ref.   ms ms  R Ulnar - ADM 25.9 ?32.0  L Ulnar - ADM 26.0 ?32.0         EMG Summary Table    Spontaneous MUAP Recruitment  Muscle IA Fib PSW Fasc Other Amp Dur. Poly Pattern  L. Deltoid Normal None None None _______ Normal Normal Normal Normal  R. Deltoid Normal None None None _______ Normal Normal Normal Normal  L. Triceps brachii Normal None None None _______ Normal Normal Normal Normal  R. Triceps brachii Normal None None None _______ Normal Normal Normal Normal  L. Pronator teres Normal None None None _______ Normal Normal Normal Normal  R. Pronator teres Normal None None None _______ Normal Normal Normal Normal  L. Opponens pollicis Normal None None None _______ Normal Normal Normal Normal  R. Opponens pollicis Normal None None None _______ Normal Normal Normal Normal  L. First dorsal interosseous Normal None None None _______ Normal Normal Normal Normal  R. First dorsal interosseous Normal None None None _______ Normal Normal Normal Normal  L. Cervical paraspinals (low) Normal None None None _______ Normal Normal Normal Normal  R. Cervical paraspinals (low) Normal None None None _______ Normal Normal Normal Normal

## 2021-01-28 NOTE — Progress Notes (Signed)
I discussed EMG nerve conduction studies with patient.  There is no peripheral etiology for her weakness and pain.  I suggest an MRI of the cervical spine to evaluate for cervical radiculopathy given she is failed conservative measures and been under the care of multiple physicians without etiology.  In a 35 year old woman I would also check MRI of the brain and cervical spine with and without contrast for multiple sclerosis or other central etiologies. Answered all questions, patient agrees.   Orders Placed This Encounter  Procedures  . MR BRAIN W WO CONTRAST  . MR CERVICAL SPINE W WO CONTRAST    I spent 20 minutes of face-to-face and non-face-to-face time with patient on the  1. Bilateral arm weakness   2. Pain in both upper extremities   3. Numbness and tingling in both hands   4. Chronic midline posterior neck pain   5. Cervical radiculopathy    diagnosis.  This included previsit chart review, lab review, study review, order entry, electronic health record documentation, patient education on the different diagnostic and therapeutic options, counseling and coordination of care, risks and benefits of management, compliance, or risk factor reduction. This does not include time spent on emg/ncs.

## 2021-02-01 ENCOUNTER — Other Ambulatory Visit: Payer: Self-pay | Admitting: Neurology

## 2021-02-01 DIAGNOSIS — M542 Cervicalgia: Secondary | ICD-10-CM

## 2021-02-01 DIAGNOSIS — M5412 Radiculopathy, cervical region: Secondary | ICD-10-CM

## 2021-02-01 DIAGNOSIS — G8929 Other chronic pain: Secondary | ICD-10-CM

## 2021-02-01 NOTE — Telephone Encounter (Signed)
Medicaid [employer] has now denied the MRI's. There two options either a peer to peer or an appeal.

## 2021-02-01 NOTE — Telephone Encounter (Signed)
I spoke with the patient and informed her that per her insurance they will not approve the exam because they want her to complete 6 week of PT. I informed her that Dr. Jaynee Eagles has placed the order and she will get a call to get that set up.

## 2021-02-01 NOTE — Telephone Encounter (Signed)
A letter would also have to be written on the reasoning of having the MRI's.

## 2021-02-01 NOTE — Telephone Encounter (Signed)
Looks like she has to go to lhysical therapy for 6 weeks prior to insurance approving MRI. I will place PT order and tell her to email [place] when she completes 6 week of PT thanks

## 2021-02-01 NOTE — Telephone Encounter (Signed)
I called [facility] to check the status on the MRI's.  They informed me that right now it is in peer to peer review.  For the MRI Brain they need notes that say your headaches are worse (increased frequency or intensity).  For the MRI Cervical spine they want notes that say you did six weeks of neck stretches (PT, chiropractic treatments, or medically directed home exercise program) in the last six months.   I'm not sure if you want to update your notes and I can fax new information to [facility] or you can call and do a peer to peer. The phone number is [phone] and the tracking number is [id number].

## 2021-02-01 NOTE — Telephone Encounter (Signed)
If I update my notes can you send thoise in with the appeal?

## 2021-02-24 ENCOUNTER — Other Ambulatory Visit: Payer: Self-pay

## 2021-02-24 ENCOUNTER — Ambulatory Visit: Payer: Medicaid Other | Attending: Neurology | Admitting: Physical Therapy

## 2021-02-24 ENCOUNTER — Encounter: Payer: Self-pay | Admitting: Physical Therapy

## 2021-02-24 DIAGNOSIS — M542 Cervicalgia: Secondary | ICD-10-CM | POA: Diagnosis not present

## 2021-02-24 DIAGNOSIS — M6281 Muscle weakness (generalized): Secondary | ICD-10-CM | POA: Insufficient documentation

## 2021-02-24 DIAGNOSIS — M5412 Radiculopathy, cervical region: Secondary | ICD-10-CM | POA: Insufficient documentation

## 2021-02-24 DIAGNOSIS — M25611 Stiffness of right shoulder, not elsewhere classified: Secondary | ICD-10-CM | POA: Diagnosis present

## 2021-02-24 DIAGNOSIS — M546 Pain in thoracic spine: Secondary | ICD-10-CM | POA: Diagnosis present
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
## 2021-02-24 NOTE — Patient Instructions (Addendum)
Access Code: [email or web address] URL: [email or web address] Date: 02/24/2021 Prepared by: Estill Bamberg April Thurnell Garbe  Exercises Sidelying Thoracic Rotation with Open Book - 1 x daily - 7 x weekly - 1 sets - 5 reps - 10 sec hold Seated Cervical Sidebending Stretch - 1 x daily - 7 x weekly - 2 sets - 20-30 sec hold Seated Cervical Flexion AROM - 1 x daily - 7 x weekly - 2 sets - 20-30 sec hold

## 2021-03-10 ENCOUNTER — Ambulatory Visit: Payer: Medicaid Other | Attending: Neurology | Admitting: Physical Therapy

## 2021-03-10 DIAGNOSIS — M5412 Radiculopathy, cervical region: Secondary | ICD-10-CM | POA: Insufficient documentation

## 2021-03-10 DIAGNOSIS — M545 Low back pain, unspecified: Secondary | ICD-10-CM | POA: Insufficient documentation

## 2021-03-10 DIAGNOSIS — M546 Pain in thoracic spine: Secondary | ICD-10-CM | POA: Insufficient documentation

## 2021-03-10 DIAGNOSIS — M25611 Stiffness of right shoulder, not elsewhere classified: Secondary | ICD-10-CM | POA: Insufficient documentation

## 2021-03-10 DIAGNOSIS — G8929 Other chronic pain: Secondary | ICD-10-CM | POA: Insufficient documentation

## 2021-03-10 DIAGNOSIS — M6281 Muscle weakness (generalized): Secondary | ICD-10-CM | POA: Insufficient documentation

## 2021-03-10 DIAGNOSIS — M542 Cervicalgia: Secondary | ICD-10-CM | POA: Insufficient documentation

## 2021-03-12 ENCOUNTER — Ambulatory Visit: Payer: Medicaid Other | Admitting: Physical Therapy

## 2021-03-12 ENCOUNTER — Other Ambulatory Visit: Payer: Self-pay

## 2021-03-12 DIAGNOSIS — M25611 Stiffness of right shoulder, not elsewhere classified: Secondary | ICD-10-CM

## 2021-03-12 DIAGNOSIS — G8929 Other chronic pain: Secondary | ICD-10-CM | POA: Diagnosis present

## 2021-03-12 DIAGNOSIS — M5412 Radiculopathy, cervical region: Secondary | ICD-10-CM | POA: Diagnosis present

## 2021-03-12 DIAGNOSIS — M6281 Muscle weakness (generalized): Secondary | ICD-10-CM

## 2021-03-12 DIAGNOSIS — M542 Cervicalgia: Secondary | ICD-10-CM

## 2021-03-12 DIAGNOSIS — M545 Low back pain, unspecified: Secondary | ICD-10-CM | POA: Diagnosis present

## 2021-03-12 DIAGNOSIS — M546 Pain in thoracic spine: Secondary | ICD-10-CM
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
## 2021-03-17 ENCOUNTER — Other Ambulatory Visit: Payer: Self-pay

## 2021-03-17 ENCOUNTER — Ambulatory Visit: Payer: Medicaid Other | Admitting: Physical Therapy

## 2021-03-17 DIAGNOSIS — M542 Cervicalgia: Secondary | ICD-10-CM

## 2021-03-17 DIAGNOSIS — M546 Pain in thoracic spine: Secondary | ICD-10-CM

## 2021-03-17 DIAGNOSIS — M5412 Radiculopathy, cervical region: Secondary | ICD-10-CM

## 2021-03-17 DIAGNOSIS — M25611 Stiffness of right shoulder, not elsewhere classified: Secondary | ICD-10-CM

## 2021-03-17 DIAGNOSIS — M6281 Muscle weakness (generalized): Secondary | ICD-10-CM
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
## 2021-03-19 ENCOUNTER — Ambulatory Visit: Payer: Medicaid Other | Admitting: Physical Therapy

## 2021-03-19 ENCOUNTER — Other Ambulatory Visit: Payer: Self-pay

## 2021-03-19 DIAGNOSIS — M542 Cervicalgia: Secondary | ICD-10-CM

## 2021-03-19 DIAGNOSIS — M5412 Radiculopathy, cervical region: Secondary | ICD-10-CM

## 2021-03-19 DIAGNOSIS — M25611 Stiffness of right shoulder, not elsewhere classified: Secondary | ICD-10-CM

## 2021-03-19 DIAGNOSIS — M6281 Muscle weakness (generalized): Secondary | ICD-10-CM

## 2021-03-19 DIAGNOSIS — M546 Pain in thoracic spine: Secondary | ICD-10-CM
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
## 2021-03-24 ENCOUNTER — Encounter: Payer: Self-pay | Admitting: Physical Therapy

## 2021-03-24 ENCOUNTER — Other Ambulatory Visit: Payer: Self-pay

## 2021-03-24 ENCOUNTER — Ambulatory Visit: Payer: Medicaid Other | Admitting: Physical Therapy

## 2021-03-24 DIAGNOSIS — M6281 Muscle weakness (generalized): Secondary | ICD-10-CM

## 2021-03-24 DIAGNOSIS — M25611 Stiffness of right shoulder, not elsewhere classified: Secondary | ICD-10-CM

## 2021-03-24 DIAGNOSIS — M542 Cervicalgia: Secondary | ICD-10-CM | POA: Diagnosis not present

## 2021-03-24 DIAGNOSIS — M546 Pain in thoracic spine: Secondary | ICD-10-CM

## 2021-03-24 DIAGNOSIS — M5412 Radiculopathy, cervical region: Secondary | ICD-10-CM
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
## 2021-03-24 NOTE — Patient Instructions (Signed)
Access Code: [email or web address] URL: [email or web address] Date: 03/24/2021 Prepared by: Estill Bamberg April Thurnell Garbe  Exercises Sidelying Thoracic Rotation with Open Book - 1 x daily - 7 x weekly - 1 sets - 5 reps - 10 sec hold Seated Cervical Sidebending Stretch - 1 x daily - 7 x weekly - 2 sets - 20-30 sec hold Upper Trap Stretch with Belt - 1 x daily - 7 x weekly - 1 sets - 10 reps - 3 hold Seated Scalene Stretch with Towel - 1 x daily - 7 x weekly - 1 sets - 10 reps - 3 hold Prone Scapular Slide with Shoulder Extension - 1 x daily - 7 x weekly - 2 sets - 10 reps Prone Shoulder Horizontal Abduction with Thumbs Up - 1 x daily - 7 x weekly - 2 sets - 10 reps Prone Scapular Retraction Y - 1 x daily - 7 x weekly - 2 sets - 10 reps Doorway Pec Stretch at 90 Degrees Abduction - 1 x daily - 7 x weekly - 2 sets - 20-30 sec hold Doorway Pec Stretch at 60 Elevation - 1 x daily - 7 x weekly - 2 sets - 20-30 sec hold

## 2021-03-26 ENCOUNTER — Other Ambulatory Visit: Payer: Self-pay

## 2021-03-26 ENCOUNTER — Encounter: Payer: Self-pay | Admitting: Physical Therapy

## 2021-03-26 ENCOUNTER — Ambulatory Visit: Payer: Medicaid Other | Admitting: Physical Therapy

## 2021-03-26 DIAGNOSIS — M546 Pain in thoracic spine: Secondary | ICD-10-CM

## 2021-03-26 DIAGNOSIS — M545 Low back pain, unspecified: Secondary | ICD-10-CM

## 2021-03-26 DIAGNOSIS — M6281 Muscle weakness (generalized): Secondary | ICD-10-CM

## 2021-03-26 DIAGNOSIS — M542 Cervicalgia: Secondary | ICD-10-CM | POA: Diagnosis not present

## 2021-03-26 DIAGNOSIS — G8929 Other chronic pain: Secondary | ICD-10-CM

## 2021-03-26 DIAGNOSIS — M25611 Stiffness of right shoulder, not elsewhere classified: Secondary | ICD-10-CM

## 2021-03-26 DIAGNOSIS — M5412 Radiculopathy, cervical region: Secondary | ICD-10-CM
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
## 2021-03-31 ENCOUNTER — Ambulatory Visit: Payer: Medicaid Other | Admitting: Physical Therapy

## 2021-04-02 ENCOUNTER — Ambulatory Visit: Payer: Medicaid Other | Attending: Neurology | Admitting: Physical Therapy

## 2021-04-02 ENCOUNTER — Other Ambulatory Visit: Payer: Self-pay

## 2021-04-02 DIAGNOSIS — M542 Cervicalgia: Secondary | ICD-10-CM

## 2021-04-02 DIAGNOSIS — M6281 Muscle weakness (generalized): Secondary | ICD-10-CM | POA: Insufficient documentation

## 2021-04-02 DIAGNOSIS — M25611 Stiffness of right shoulder, not elsewhere classified: Secondary | ICD-10-CM | POA: Insufficient documentation

## 2021-04-02 DIAGNOSIS — M546 Pain in thoracic spine: Secondary | ICD-10-CM | POA: Insufficient documentation

## 2021-04-02 DIAGNOSIS — M5412 Radiculopathy, cervical region: Secondary | ICD-10-CM | POA: Diagnosis present
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
## 2021-04-07 ENCOUNTER — Other Ambulatory Visit: Payer: Self-pay

## 2021-04-07 ENCOUNTER — Ambulatory Visit: Payer: Medicaid Other | Admitting: Physical Therapy

## 2021-04-07 DIAGNOSIS — M25611 Stiffness of right shoulder, not elsewhere classified: Secondary | ICD-10-CM

## 2021-04-07 DIAGNOSIS — M546 Pain in thoracic spine: Secondary | ICD-10-CM

## 2021-04-07 DIAGNOSIS — M6281 Muscle weakness (generalized): Secondary | ICD-10-CM

## 2021-04-07 DIAGNOSIS — M5412 Radiculopathy, cervical region: Secondary | ICD-10-CM

## 2021-04-07 DIAGNOSIS — M542 Cervicalgia: Secondary | ICD-10-CM | POA: Diagnosis not present
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
## 2021-04-09 ENCOUNTER — Ambulatory Visit: Payer: Medicaid Other | Admitting: Physical Therapy

## 2021-04-09 ENCOUNTER — Other Ambulatory Visit: Payer: Self-pay

## 2021-04-09 DIAGNOSIS — M542 Cervicalgia: Secondary | ICD-10-CM | POA: Diagnosis not present

## 2021-04-09 DIAGNOSIS — M5412 Radiculopathy, cervical region: Secondary | ICD-10-CM

## 2021-04-09 DIAGNOSIS — M546 Pain in thoracic spine: Secondary | ICD-10-CM

## 2021-04-09 DIAGNOSIS — M25611 Stiffness of right shoulder, not elsewhere classified: Secondary | ICD-10-CM

## 2021-04-09 DIAGNOSIS — M6281 Muscle weakness (generalized): Secondary | ICD-10-CM
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
## 2021-05-31 ENCOUNTER — Other Ambulatory Visit (HOSPITAL_COMMUNITY)
Admission: RE | Admit: 2021-05-31 | Discharge: 2021-05-31 | Disposition: A | Discharge status: Home / Self Care | Payer: Medicaid Other | Source: Ambulatory Visit | Attending: Family Medicine | Admitting: Family Medicine

## 2021-05-31 ENCOUNTER — Ambulatory Visit (INDEPENDENT_AMBULATORY_CARE_PROVIDER_SITE_OTHER): Payer: Medicaid Other

## 2021-05-31 ENCOUNTER — Encounter (HOSPITAL_COMMUNITY): Payer: Self-pay

## 2021-05-31 ENCOUNTER — Emergency Department (HOSPITAL_COMMUNITY): Payer: Medicaid Other

## 2021-05-31 ENCOUNTER — Emergency Department (HOSPITAL_COMMUNITY)
Admission: EM | Admit: 2021-05-31 | Discharge: 2021-05-31 | Disposition: A | Discharge status: Home / Self Care | Payer: Medicaid Other | Attending: Emergency Medicine | Admitting: Emergency Medicine

## 2021-05-31 ENCOUNTER — Other Ambulatory Visit: Payer: Self-pay

## 2021-05-31 VITALS — BP 116/80 | HR 75 | Ht 66.0 in | Wt 209.0 lb

## 2021-05-31 DIAGNOSIS — R109 Unspecified abdominal pain: Secondary | ICD-10-CM

## 2021-05-31 DIAGNOSIS — N9489 Other specified conditions associated with female genital organs and menstrual cycle: Secondary | ICD-10-CM | POA: Diagnosis not present

## 2021-05-31 DIAGNOSIS — N898 Other specified noninflammatory disorders of vagina: Secondary | ICD-10-CM

## 2021-05-31 DIAGNOSIS — R350 Frequency of micturition: Secondary | ICD-10-CM

## 2021-05-31 DIAGNOSIS — R42 Dizziness and giddiness: Secondary | ICD-10-CM | POA: Diagnosis not present

## 2021-05-31 DIAGNOSIS — R310 Gross hematuria: Secondary | ICD-10-CM | POA: Insufficient documentation

## 2021-05-31 DIAGNOSIS — R0602 Shortness of breath: Secondary | ICD-10-CM | POA: Insufficient documentation

## 2021-05-31 DIAGNOSIS — R002 Palpitations: Secondary | ICD-10-CM | POA: Insufficient documentation

## 2021-05-31 DIAGNOSIS — Z87891 Personal history of nicotine dependence: Secondary | ICD-10-CM | POA: Insufficient documentation

## 2021-05-31 DIAGNOSIS — J45909 Unspecified asthma, uncomplicated: Secondary | ICD-10-CM | POA: Insufficient documentation

## 2021-05-31 DIAGNOSIS — M549 Dorsalgia, unspecified: Secondary | ICD-10-CM | POA: Insufficient documentation

## 2021-05-31 DIAGNOSIS — R6883 Chills (without fever): Secondary | ICD-10-CM | POA: Diagnosis not present

## 2021-05-31 DIAGNOSIS — Z8616 Personal history of COVID-19: Secondary | ICD-10-CM | POA: Diagnosis not present

## 2021-05-31 LAB — POCT URINALYSIS DIP (DEVICE)
Bilirubin Urine: NEGATIVE
Glucose, UA: NEGATIVE mg/dL
Ketones, ur: NEGATIVE mg/dL
Leukocytes,Ua: NEGATIVE
Nitrite: NEGATIVE
Protein, ur: NEGATIVE mg/dL
Specific Gravity, Urine: 1.02 (ref 1.005–1.030)
Urobilinogen, UA: 0.2 mg/dL (ref 0.0–1.0)
pH: 7 (ref 5.0–8.0)

## 2021-05-31 LAB — COMPREHENSIVE METABOLIC PANEL
ALT: 18 U/L (ref 0–44)
AST: 17 U/L (ref 15–41)
Albumin: 4.3 g/dL (ref 3.5–5.0)
Alkaline Phosphatase: 51 U/L (ref 38–126)
Anion gap: 8 (ref 5–15)
BUN: 9 mg/dL (ref 6–20)
CO2: 27 mmol/L (ref 22–32)
Calcium: 9.5 mg/dL (ref 8.9–10.3)
Chloride: 102 mmol/L (ref 98–111)
Creatinine, Ser: 0.74 mg/dL (ref 0.44–1.00)
GFR, Estimated: >60 mL/min (ref >60)
Glucose, Bld: 92 mg/dL (ref 70–99)
Potassium: 4.3 mmol/L (ref 3.5–5.1)
Sodium: 137 mmol/L (ref 135–145)
Total Bilirubin: 0.5 mg/dL (ref 0.3–1.2)
Total Protein: 7.6 g/dL (ref 6.5–8.1)

## 2021-05-31 LAB — URINALYSIS, ROUTINE W REFLEX MICROSCOPIC
Bilirubin Urine: NEGATIVE
Glucose, UA: NEGATIVE mg/dL
Ketones, ur: NEGATIVE mg/dL
Nitrite: NEGATIVE
Protein, ur: NEGATIVE mg/dL
RBC / HPF: >50 RBC/hpf — ABNORMAL HIGH (ref 0–5)
Specific Gravity, Urine: 1.019 (ref 1.005–1.030)
pH: 5 (ref 5.0–8.0)

## 2021-05-31 LAB — CBC WITH DIFFERENTIAL/PLATELET
Abs Immature Granulocytes: 0.02 10*3/uL (ref 0.00–0.07)
Basophils Absolute: 0.1 10*3/uL (ref 0.0–0.1)
Basophils Relative: 1 %
Eosinophils Absolute: 0.2 10*3/uL (ref 0.0–0.5)
Eosinophils Relative: 3 %
HCT: 39.8 % (ref 36.0–46.0)
Hemoglobin: 13.3 g/dL (ref 12.0–15.0)
Immature Granulocytes: 0 %
Lymphocytes Relative: 32 %
Lymphs Abs: 2.4 10*3/uL (ref 0.7–4.0)
MCH: 30.2 pg (ref 26.0–34.0)
MCHC: 33.4 g/dL (ref 30.0–36.0)
MCV: 90.2 fL (ref 80.0–100.0)
Monocytes Absolute: 0.4 10*3/uL (ref 0.1–1.0)
Monocytes Relative: 6 %
Neutro Abs: 4.5 10*3/uL (ref 1.7–7.7)
Neutrophils Relative %: 58 %
Platelets: 265 10*3/uL (ref 150–400)
RBC: 4.41 MIL/uL (ref 3.87–5.11)
RDW: 12.7 % (ref 11.5–15.5)
WBC: 7.7 10*3/uL (ref 4.0–10.5)
nRBC: 0 % (ref 0.0–0.2)

## 2021-05-31 LAB — D-DIMER, QUANTITATIVE: D-Dimer, Quant: 0.63 ug/mL-FEU — ABNORMAL HIGH (ref 0.00–0.50)

## 2021-05-31 LAB — TROPONIN I (HIGH SENSITIVITY): Troponin I (High Sensitivity): <2 ng/L (ref <18)

## 2021-05-31 LAB — LIPASE, BLOOD: Lipase: 37 U/L (ref 11–51)

## 2021-05-31 LAB — I-STAT BETA HCG BLOOD, ED (MC, WL, AP ONLY): I-stat hCG, quantitative: <5 m[IU]/mL (ref <5)

## 2021-05-31 MED ORDER — SODIUM CHLORIDE 0.9 % IV BOLUS
1000.0000 mL | Freq: Once | INTRAVENOUS | Status: AC
  Administered 2021-05-31: 1000 mL via INTRAVENOUS

## 2021-05-31 MED ORDER — ALBUTEROL SULFATE HFA 108 (90 BASE) MCG/ACT IN AERS: 2.0000 | INHALATION_SPRAY | RESPIRATORY_TRACT | Status: DC | PRN

## 2021-05-31 MED ORDER — MELOXICAM 7.5 MG PO TABS: 7.5000 mg | ORAL_TABLET | Freq: Every day | ORAL | 0 refills | Status: AC

## 2021-05-31 MED ORDER — MORPHINE SULFATE (PF) 4 MG/ML IV SOLN
4.0000 mg | Freq: Once | INTRAVENOUS | Status: AC
  Administered 2021-05-31: 4 mg via INTRAVENOUS
  Filled 2021-05-31: qty 1

## 2021-05-31 MED ORDER — IOHEXOL 350 MG/ML SOLN
100.0000 mL | Freq: Once | INTRAVENOUS | Status: AC | PRN
  Administered 2021-05-31: 100 mL via INTRAVENOUS

## 2021-05-31 MED ORDER — METHOCARBAMOL 500 MG PO TABS: 500.0000 mg | ORAL_TABLET | Freq: Two times a day (BID) | ORAL | 0 refills | Status: DC

## 2021-05-31 MED ORDER — ONDANSETRON HCL 4 MG/2ML IJ SOLN
4.0000 mg | Freq: Once | INTRAMUSCULAR | Status: AC
  Administered 2021-05-31: 4 mg via INTRAVENOUS
  Filled 2021-05-31: qty 2

## 2021-05-31 NOTE — Discharge Instructions (Addendum)
Recommend recheck with your primary care provider in 1 week due to blood in your urine today and to follow-up on your symptoms.  Prescription given for Robaxin and meloxicam to take for your pain today.  Return to the emergency room for worsening or concerning symptoms.

## 2021-05-31 NOTE — ED Notes (Signed)
Patient discharged no concerns at this time. AVS reviewed.

## 2021-05-31 NOTE — ED Provider Notes (Signed)
Emergency Medicine Provider Triage Evaluation Note  Meghan Reed , a 35 y.o. female  was evaluated in triage.  Pt complains of sob that is intermittent.  Seen by obgyn pta and sent here for r/o pe or kidney stone  Review of Systems  Positive: Sob, right back pain, chest pain Negative: Dysuria, frequency  Physical Exam  BP 117/83 (BP Location: Left Arm)   Pulse 79   Temp 98.2 F (36.8 C) (Oral)   Resp 16   Ht '5\' 6"'$  (1.676 m)   LMP 05/18/2021 (Exact Date)   SpO2 99%   BMI 33.73 kg/m  Gen:   Awake, no distress   Resp:  Normal effort  MSK:   Moves extremities without difficulty   Medical Decision Making  Medically screening exam initiated at 3:30 PM.  Appropriate orders placed.  Lorriane Rajski was informed that the remainder of the evaluation will be completed by another provider, this initial triage assessment does not replace that evaluation, and the importance of remaining in the ED until their evaluation is complete.     Rodney Booze, PA-C 05/31/21 1531    Daleen Bo, MD 05/31/21 [phone]

## 2021-05-31 NOTE — Progress Notes (Signed)
Pt states here today for urinary frequency and right flank pain with spasms and bladder pressure  x 2 days with nausea. Denies any fever, vomiting. Also having intermittent SOB  while going up and down steps that has been getting worse over the last week. Pt not in distress today.Has hx of PE in Oct 2020.  Pt also states having vaginal discharge with odor x 1 week. Has taken Tylenol this morning with no relief.  Pt states never had kidney stone in past , but has hx of BV. Also has new sex partner. Would like self swab today for discharge and STD check.   UA- negative, +small blood. Reviewed with Dr Ilda Basset, pt advised to go to ER for r/o PE vs kidney stone. Pt agreeable to plan of care.   Self swab collected today. Pt advised results will take 24-48 hours and will see results in mychart and will be notified if needs further treatment. Pt verbalized understanding and agreeable to plan of care.   Colletta Maryland, RN

## 2021-05-31 NOTE — ED Notes (Signed)
Patient gone for testing 

## 2021-05-31 NOTE — ED Triage Notes (Addendum)
Patient c/o SOB x 1 week and pain right mid back and nausea since last night. Patient states right mid back pain worse with a deep breath.   Patient went to her PCP today and was sent to the ED to r/o PE or kidney stone.
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
## 2021-06-01 LAB — CERVICOVAGINAL ANCILLARY ONLY
Bacterial Vaginitis (gardnerella): NEGATIVE
Candida Glabrata: NEGATIVE
Candida Vaginitis: NEGATIVE
Chlamydia: NEGATIVE
Comment: NEGATIVE
Comment: NEGATIVE
Comment: NEGATIVE
Comment: NEGATIVE
Comment: NEGATIVE
Comment: NORMAL
Neisseria Gonorrhea: NEGATIVE
Trichomonas: NEGATIVE

## 2021-06-06 NOTE — Progress Notes (Signed)
Patient was assessed and managed by nursing staff during this encounter. I have reviewed the chart and agree with the documentation and plan. I have also made any necessary editorial changes.  Aletha Halim, MD 06/06/2021 9:40 AM

## 2021-06-29 ENCOUNTER — Ambulatory Visit (INDEPENDENT_AMBULATORY_CARE_PROVIDER_SITE_OTHER): Payer: Medicaid Other | Admitting: Family Medicine

## 2021-06-29 ENCOUNTER — Other Ambulatory Visit: Payer: Self-pay

## 2021-06-29 VITALS — BP 108/68 | HR 74 | Ht 66.0 in | Wt 203.4 lb

## 2021-06-29 DIAGNOSIS — F419 Anxiety disorder, unspecified: Secondary | ICD-10-CM | POA: Diagnosis not present

## 2021-06-29 DIAGNOSIS — Z86711 Personal history of pulmonary embolism: Secondary | ICD-10-CM | POA: Insufficient documentation

## 2021-06-29 NOTE — Patient Instructions (Addendum)
It was great seeing you today!  I am glad you are feeling better. I think some of your symptoms may be due to stress and anxiety, try some of the coping techniques that we discussed. Below are a list of therapists as you requested, please pick one that is best for you and establish care.   If you are having any worsening symptoms including but not limited to difficulty breathing, shortness of breath, heart racing and worsening calf pain then please go to the ED given your previous history of a pulmonary embolism.   Please follow up at your next scheduled appointment, if anything arises between now and then, please don't hesitate to contact our office.   Thank you for allowing [place] to be a part of your medical care!  Thank you, Dr. Larae Grooms    Therapy and Counseling Resources Most providers on this list will take Medicaid. Patients with commercial insurance or Medicare should contact their insurance company to get a list of in network providers.  [facility]:Psychiatry and Counseling [address] [facility] [address]. [place], [place] [address] [place], [place], [place] [address]      [place] [address]  [place], [place] [address] [phone]  [place] [address], [place]  [place],  [address]       [phone]     [facility] (virtual only) [phone]  [facility] [address] [place], [place]  [facility]:  [place]. [place] [phone]  [facility]:  [place] [address] [place] [phone]  [facility] (under & uninsured) [address] [place] [phone]    [email or web address]    [place] [address]  [place]    [phone]  [facility] [place]      Phone:  [phone]     [place]  [place] [address]      [place], [place] ext [place]: [place], [place], [place]   [place] ([place] therapist) [email or web address]  [place] [address]   [place] [address]    [phone]    [facility]   [address],  [place], [place]   [place]  [place]  [facility]  [address] [place], [place]        [phone]  Open Access/Walk In Clinic under & uninsured  [facility]  [address] [place], [place] Crisis [phone]  [facility] [place],  ([place])   [place], [place]: [phone]) 8:30 - 12; 1 - 2:30  [facility] the [place],  [place], [place]    ([phone]):8:30 - 12; 2 - 3PM  [facility] [employer],  [address],  [place]; [phone]):   Mon - Fri 8 AM - 5 PM  [facility] [place]  MWF 12:30 to 3:00 or call to schedule an appointment  [phone]  Specific Provider options Psychology Today  [email or web address] click on find a therapist  enter your zip  code left side and select or tailor a therapist for your specific need.   [facility] Provider Directory [email or web address]  (Medicaid)   Follow all drop down to find a provider  [place] or [email or web address] [address], [place], [place] Recovery support and educational   24- Hour Availability:   [facility]  [address] [place], [place] Crisis [phone]  [facility] the [employer]  [phone]  [place]  [phone]   [place]  [phone] (after hours)  Therapeutic Alternative/Mobile Crisis   [phone]  [place] National Suicide Hotline  [phone] [phone] Diamantina Monks)  Call 911 or go to emergency room  [facility]  [phone]);  [place] and [employer]  [phone]); [place], [place], [place], [place], [place], [place], [place]

## 2021-06-29 NOTE — Assessment & Plan Note (Signed)
-  coping techniques discussed and reassurance provided -list of therapists provided

## 2021-06-29 NOTE — Assessment & Plan Note (Addendum)
-  given prior history of PE, very concerning for PE initially prior to ED visit but reassuring that patient's work up was normal and symptoms have improved with resolution of many symptoms -encouraged use of compression stockings  -anxiety also likely contributing to patient's symptoms  -strict ED precautions discussed

## 2021-06-29 NOTE — Progress Notes (Signed)
    SUBJECTIVE:   CHIEF COMPLAINT / HPI:   Patient presents for follow up after being seen by the ED earlier this month for concern for possible pulmonary embolism. First was experiencing right-sided flank pain, calf tenderness and hematuria, given this was seen by gynecology who recommended ED evaluation for possible PE given patient previous history of PE during pregnancy. In the ED, CTA negative for PE and CT renal stone study also performed which demonstrated no urinary tract calculi or obstructive uropathy. She was then discharged from the ED and not sent for hospital admission. Since then, patient states that all of her symptoms have either resolved or improved. Endorsing continued fatigue, improved bilateral calf tenderness and improved dyspnea. Denies chest pain, hematuria, flank pain, palpitations or difficulty breathing. She states that she also has a history of anxiety and gets panic attacks but this episode prior to her ED visit felt differently. Currently endorsing a number of ongoing personal stressors and is currently not seeing a therapist but open to doing so. Her support system is her father and friend.    OBJECTIVE:   BP 108/68   Pulse 74   Ht '5\' 6"'$  (1.676 m)   Wt 203 lb 6.4 oz (92.3 kg)   LMP 06/11/2021   SpO2 98%   BMI 32.83 kg/m   General: Patient well-appearing, in no acute distress. CV: RRR, no murmurs or gallops auscultated Resp: CTAB, good air movement throughout all lung fields, no wheezing, rales or rhonchi noted, breathing comfortably on room air Abdomen: soft, non-tender, nondistended, presence of bowel sounds Ext: minimal left calf soreness noted, no LE edema noted bilaterally  Neuro: normal gait Psych: mood appropriate, pleasant   ASSESSMENT/PLAN:   Anxious mood -coping techniques discussed and reassurance provided -list of therapists provided   History of pulmonary embolus (PE) -given prior history of PE, very concerning for PE initially prior to ED  visit but reassuring that patient's work up was normal and symptoms have improved with resolution of many symptoms -encouraged use of compression stockings  -anxiety also likely contributing to patient's symptoms  -strict ED precautions discussed     Donney Dice, [place]

## 2021-07-14 ENCOUNTER — Other Ambulatory Visit: Payer: Self-pay

## 2021-07-14 ENCOUNTER — Ambulatory Visit
Admission: EM | Admit: 2021-07-14 | Discharge: 2021-07-14 | Disposition: A | Discharge status: Home / Self Care | Payer: Medicaid Other | Attending: Internal Medicine | Admitting: Internal Medicine

## 2021-07-14 ENCOUNTER — Ambulatory Visit (INDEPENDENT_AMBULATORY_CARE_PROVIDER_SITE_OTHER): Payer: Medicaid Other

## 2021-07-14 ENCOUNTER — Encounter: Payer: Self-pay | Admitting: Emergency Medicine

## 2021-07-14 DIAGNOSIS — S8001XA Contusion of right knee, initial encounter: Secondary | ICD-10-CM

## 2021-07-14 DIAGNOSIS — M25571 Pain in right ankle and joints of right foot: Secondary | ICD-10-CM

## 2021-07-14 DIAGNOSIS — S93491A Sprain of other ligament of right ankle, initial encounter: Secondary | ICD-10-CM

## 2021-07-14 DIAGNOSIS — M25561 Pain in right knee: Secondary | ICD-10-CM | POA: Diagnosis not present

## 2021-07-14 DIAGNOSIS — M79671 Pain in right foot: Secondary | ICD-10-CM | POA: Diagnosis not present

## 2021-07-14 NOTE — ED Triage Notes (Signed)
Pt here for right knee and ankle pain after stretcher pinned leg against wall while at work
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
## 2021-07-14 NOTE — Discharge Instructions (Signed)
All of your x-rays were negative for fracture.  Suspect that you have a contusion to your right knee and a right ankle sprain.  An Ace wrap has been applied to help with compression.  Do not sleep in this Ace wrap.  You have also been supplied with crutches.  Please take ibuprofen as needed for pain and use ice application as well as elevating extremity.  Follow-up with provided contact information for orthopedist if pain persists.

## 2021-08-04 ENCOUNTER — Ambulatory Visit: Payer: Medicaid Other | Admitting: Neurology

## 2021-08-13 ENCOUNTER — Encounter: Payer: Self-pay | Admitting: Neurology

## 2021-08-13 ENCOUNTER — Ambulatory Visit: Payer: Medicaid Other | Admitting: Neurology

## 2021-08-13 VITALS — BP 114/80 | HR 76 | Ht 66.0 in | Wt 205.2 lb

## 2021-08-13 DIAGNOSIS — M5412 Radiculopathy, cervical region: Secondary | ICD-10-CM

## 2021-08-13 DIAGNOSIS — M542 Cervicalgia: Secondary | ICD-10-CM | POA: Diagnosis not present

## 2021-08-13 DIAGNOSIS — R29898 Other symptoms and signs involving the musculoskeletal system: Secondary | ICD-10-CM | POA: Diagnosis not present

## 2021-08-13 DIAGNOSIS — G43009 Migraine without aura, not intractable, without status migrainosus: Secondary | ICD-10-CM

## 2021-08-13 DIAGNOSIS — R270 Ataxia, unspecified: Secondary | ICD-10-CM | POA: Diagnosis not present

## 2021-08-13 DIAGNOSIS — G8929 Other chronic pain: Secondary | ICD-10-CM

## 2021-08-13 MED ORDER — NORTRIPTYLINE HCL 25 MG PO CAPS: 25.0000 mg | ORAL_CAPSULE | Freq: Every day | ORAL | 3 refills | Status: DC

## 2021-08-13 MED ORDER — ONDANSETRON 8 MG PO TBDP: 4.0000 mg | ORAL_TABLET | Freq: Three times a day (TID) | ORAL | 6 refills | Status: DC | PRN

## 2021-08-13 MED ORDER — RIZATRIPTAN BENZOATE 10 MG PO TBDP: 10.0000 mg | ORAL_TABLET | ORAL | 11 refills | Status: DC | PRN

## 2021-08-13 NOTE — Patient Instructions (Addendum)
Migraine and neck pain: Nortriptyline at bedtime. Then consider new CGRP medications (Emgality/Ajovy/Qulipta) Acute/emergent: Rizatriptan: Please take one tablet at the onset of your headache. If it does not improve the symptoms please take one additional tablet. Do not take more then 2 tablets in 24hrs. Do not take use more then 2 to 3 times in a week. Next consider Ubrelvy/Nurtec Ondansetron: may take with Maxalt or alone for nausea MRI of the cervical spine  Nortriptyline capsules What is this medication? NORTRIPTYLINE (nor TRIP ti leen) is used to treat depression. This medicine may be used for other purposes; ask your health care provider or pharmacist if you have questions. COMMON BRAND NAME(S): Aventyl, Pamelor What should I tell my care team before I take this medication? They need to know if you have any of these conditions: bipolar disorder Brugada syndrome difficulty passing urine glaucoma heart disease if you drink alcohol liver disease schizophrenia seizures suicidal thoughts, plans or attempt; a previous suicide attempt by you or a family member thyroid disease an unusual or allergic reaction to nortriptyline, other tricyclic antidepressants, other medicines, foods, dyes, or preservatives pregnant or trying to get pregnant breast-feeding How should I use this medication? Take this medicine by mouth with a glass of water. Follow the directions on the prescription label. Take your doses at regular intervals. Do not take it more often than directed. Do not stop taking this medicine suddenly except upon the advice of your doctor. Stopping this medicine too quickly may cause serious side effects or your condition may worsen. A special MedGuide will be given to you by the pharmacist with each prescription and refill. Be sure to read this information carefully each time. Talk to your pediatrician regarding the use of this medicine in children. Special care may be  needed. Overdosage: If you think you have taken too much of this medicine contact a poison control center or emergency room at once. NOTE: This medicine is only for you. Do not share this medicine with others. What if I miss a dose? If you miss a dose, take it as soon as you can. If it is almost time for your next dose, take only that dose. Do not take double or extra doses. What may interact with this medication? Do not take this medicine with any of the following medications: cisapride dronedarone linezolid MAOIs like Carbex, Eldepryl, Marplan, Nardil, and Parnate methylene blue (injected into a vein) pimozide thioridazine This medicine may also interact with the following medications: alcohol antihistamines for allergy, cough, and cold atropine certain medicines for bladder problems like oxybutynin, tolterodine certain medicines for depression like amitriptyline, fluoxetine, sertraline certain medicines for Parkinson's disease like benztropine, trihexyphenidyl certain medicines for stomach problems like dicyclomine, hyoscyamine certain medicines for travel sickness like scopolamine chlorpropamide cimetidine ipratropium other medicines that prolong the QT interval (an abnormal heart rhythm) like dofetilide other medicines that can cause serotonin syndrome like St. John's Wort, fentanyl, lithium, tramadol, tryptophan, buspirone, and some medicines for headaches like sumatriptan or rizatriptan quinidine reserpine thyroid medicine This list may not describe all possible interactions. Give your health care provider a list of all the medicines, herbs, non-prescription drugs, or dietary supplements you use. Also tell them if you smoke, drink alcohol, or use illegal drugs. Some items may interact with your medicine. What should I watch for while using this medication? Tell your doctor if your symptoms do not get better or if they get worse. Visit your doctor or health care professional  for regular checks on  your progress. Because it may take several weeks to see the full effects of this medicine, it is important to continue your treatment as prescribed by your doctor. Patients and their families should watch out for new or worsening thoughts of suicide or depression. Also watch out for sudden changes in feelings such as feeling anxious, agitated, panicky, irritable, hostile, aggressive, impulsive, severely restless, overly excited and hyperactive, or not being able to sleep. If this happens, especially at the beginning of treatment or after a change in dose, call your health care professional. Dennis Bast may get drowsy or dizzy. Do not drive, use machinery, or do anything that needs mental alertness until you know how this medicine affects you. Do not stand or sit up quickly, especially if you are an older patient. This reduces the risk of dizzy or fainting spells. Alcohol may interfere with the effect of this medicine. Avoid alcoholic drinks. Do not treat yourself for coughs, colds, or allergies without asking your doctor or health care professional for advice. Some ingredients can increase possible side effects. Your mouth may get dry. Chewing sugarless gum or sucking hard candy, and drinking plenty of water may help. Contact your doctor if the problem does not go away or is severe. This medicine may cause dry eyes and blurred vision. If you wear contact lenses you may feel some discomfort. Lubricating drops may help. See your eye doctor if the problem does not go away or is severe. This medicine can cause constipation. Try to have a bowel movement at least every 2 to 3 days. If you do not have a bowel movement for 3 days, call your doctor or health care professional. This medicine can make you more sensitive to the sun. Keep out of the sun. If you cannot avoid being in the sun, wear protective clothing and use sunscreen. Do not use sun lamps or tanning beds/booths. What side effects may I  notice from receiving this medication? Side effects that you should report to your doctor or health care professional as soon as possible: allergic reactions like skin rash, itching or hives, swelling of the face, lips, or tongue anxious breathing problems changes in vision confusion elevated mood, decreased need for sleep, racing thoughts, impulsive behavior eye pain fast, irregular heartbeat feeling faint or lightheaded, falls feeling agitated, angry, or irritable fever with increased sweating hallucination, loss of contact with reality seizures stiff muscles suicidal thoughts or other mood changes tingling, pain, or numbness in the feet or hands trouble passing urine or change in the amount of urine trouble sleeping unusually weak or tired vomiting yellowing of the eyes or skin Side effects that usually do not require medical attention (report to your doctor or health care professional if they continue or are bothersome): change in sex drive or performance change in appetite or weight constipation dizziness dry mouth nausea tired tremors upset stomach This list may not describe all possible side effects. Call your doctor for medical advice about side effects. You may report side effects to FDA at 1-800-FDA-1088. Where should I keep my medication? Keep out of the reach of children. Store at room temperature between 15 and 30 degrees C (59 and 86 degrees F). Keep container tightly closed. Throw away any unused medicine after the expiration date. NOTE: This sheet is a summary. It may not cover all possible information. If you have questions about this medicine, talk to your doctor, pharmacist, or health care provider.  2022 [employer] Standard (2020-06-02 15:25:34)
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
## 2021-08-16 ENCOUNTER — Encounter: Payer: Self-pay | Admitting: Family Medicine

## 2021-08-16 ENCOUNTER — Other Ambulatory Visit (HOSPITAL_COMMUNITY)
Admission: RE | Admit: 2021-08-16 | Discharge: 2021-08-16 | Disposition: A | Discharge status: Home / Self Care | Payer: Medicaid Other | Source: Ambulatory Visit | Attending: Family Medicine | Admitting: Family Medicine

## 2021-08-16 ENCOUNTER — Ambulatory Visit (INDEPENDENT_AMBULATORY_CARE_PROVIDER_SITE_OTHER): Payer: Medicaid Other | Admitting: Family Medicine

## 2021-08-16 ENCOUNTER — Other Ambulatory Visit: Payer: Self-pay

## 2021-08-16 VITALS — BP 105/77 | HR 78 | Ht 66.0 in | Wt 198.8 lb

## 2021-08-16 DIAGNOSIS — Z83438 Family history of other disorder of lipoprotein metabolism and other lipidemia: Secondary | ICD-10-CM | POA: Diagnosis not present

## 2021-08-16 DIAGNOSIS — Z0000 Encounter for general adult medical examination without abnormal findings: Secondary | ICD-10-CM

## 2021-08-16 DIAGNOSIS — M79662 Pain in left lower leg: Secondary | ICD-10-CM | POA: Diagnosis not present

## 2021-08-16 DIAGNOSIS — Z1159 Encounter for screening for other viral diseases: Secondary | ICD-10-CM | POA: Diagnosis not present

## 2021-08-16 DIAGNOSIS — Z113 Encounter for screening for infections with a predominantly sexual mode of transmission: Secondary | ICD-10-CM

## 2021-08-16 DIAGNOSIS — R300 Dysuria: Secondary | ICD-10-CM | POA: Diagnosis not present

## 2021-08-16 LAB — POCT URINALYSIS DIP (MANUAL ENTRY)
Bilirubin, UA: NEGATIVE
Glucose, UA: NEGATIVE mg/dL
Ketones, POC UA: NEGATIVE mg/dL
Leukocytes, UA: NEGATIVE
Nitrite, UA: NEGATIVE
Spec Grav, UA: 1.02 (ref 1.010–1.025)
Urobilinogen, UA: 0.2 E.U./dL
pH, UA: 7 (ref 5.0–8.0)
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
## 2021-08-16 NOTE — Patient Instructions (Signed)
Thank you for coming in today.  Today we checked your urine you do not have a urinary tract infection.  For the left calf pain consider using a tennis ball for massage and stretching prior to work and after.  For the labs if normal I will communicate via MyChart if abnormal I will give you a call. You are due for Pap 2024 at the earliest and 2026 at the latest.  Dr. Janus Molder

## 2021-08-17 ENCOUNTER — Encounter: Payer: Self-pay | Admitting: Family Medicine

## 2021-08-17 ENCOUNTER — Telehealth: Payer: Self-pay | Admitting: Family Medicine

## 2021-08-17 DIAGNOSIS — B9689 Other specified bacterial agents as the cause of diseases classified elsewhere: Secondary | ICD-10-CM

## 2021-08-17 DIAGNOSIS — A749 Chlamydial infection, unspecified: Secondary | ICD-10-CM

## 2021-08-17 LAB — CERVICOVAGINAL ANCILLARY ONLY
Bacterial Vaginitis (gardnerella): POSITIVE — AB
Candida Glabrata: NEGATIVE
Candida Vaginitis: NEGATIVE
Chlamydia: POSITIVE — AB
Comment: NEGATIVE
Comment: NEGATIVE
Comment: NEGATIVE
Comment: NEGATIVE
Comment: NEGATIVE
Comment: NORMAL
Neisseria Gonorrhea: NEGATIVE
Trichomonas: NEGATIVE

## 2021-08-17 LAB — LIPID PANEL
Chol/HDL Ratio: 4.3 ratio (ref 0.0–4.4)
Cholesterol, Total: 234 mg/dL — ABNORMAL HIGH (ref 100–199)
HDL: 55 mg/dL (ref >39)
LDL Chol Calc (NIH): 165 mg/dL — ABNORMAL HIGH (ref 0–99)
Triglycerides: 78 mg/dL (ref 0–149)
VLDL Cholesterol Cal: 14 mg/dL (ref 5–40)

## 2021-08-17 LAB — HIV ANTIBODY (ROUTINE TESTING W REFLEX): HIV Screen 4th Generation wRfx: NONREACTIVE

## 2021-08-17 LAB — RPR: RPR Ser Ql: NONREACTIVE

## 2021-08-17 MED ORDER — DOXYCYCLINE HYCLATE 100 MG PO TABS: 100.0000 mg | ORAL_TABLET | Freq: Two times a day (BID) | ORAL | 0 refills | Status: AC

## 2021-08-17 MED ORDER — METRONIDAZOLE 500 MG PO TABS: 500.0000 mg | ORAL_TABLET | Freq: Two times a day (BID) | ORAL | 0 refills | Status: AC

## 2021-08-17 NOTE — Telephone Encounter (Signed)
Called patient and informed of +chlamydia and BV results. Patient did not have any questions. Discussed TOC in 3 months for chlamydia. She voiced understanding.   Gerlene Fee, DO 08/17/2021, 6:03 PM PGY-3, [place]

## 2021-08-18 ENCOUNTER — Emergency Department (HOSPITAL_BASED_OUTPATIENT_CLINIC_OR_DEPARTMENT_OTHER)
Admission: EM | Admit: 2021-08-18 | Discharge: 2021-08-18 | Disposition: A | Discharge status: Home / Self Care | Payer: Medicaid Other | Attending: Emergency Medicine | Admitting: Emergency Medicine

## 2021-08-18 ENCOUNTER — Telehealth: Payer: Self-pay | Admitting: Neurology

## 2021-08-18 ENCOUNTER — Other Ambulatory Visit: Payer: Self-pay

## 2021-08-18 ENCOUNTER — Encounter (HOSPITAL_BASED_OUTPATIENT_CLINIC_OR_DEPARTMENT_OTHER): Payer: Self-pay | Admitting: Emergency Medicine

## 2021-08-18 DIAGNOSIS — B9689 Other specified bacterial agents as the cause of diseases classified elsewhere: Secondary | ICD-10-CM | POA: Diagnosis not present

## 2021-08-18 DIAGNOSIS — R102 Pelvic and perineal pain: Secondary | ICD-10-CM | POA: Insufficient documentation

## 2021-08-18 DIAGNOSIS — N760 Acute vaginitis: Secondary | ICD-10-CM | POA: Diagnosis not present

## 2021-08-18 DIAGNOSIS — Z5321 Procedure and treatment not carried out due to patient leaving prior to being seen by health care provider: Secondary | ICD-10-CM | POA: Insufficient documentation

## 2021-08-18 DIAGNOSIS — A568 Sexually transmitted chlamydial infection of other sites: Secondary | ICD-10-CM | POA: Insufficient documentation

## 2021-08-18 DIAGNOSIS — R110 Nausea: Secondary | ICD-10-CM | POA: Diagnosis not present

## 2021-08-18 DIAGNOSIS — R42 Dizziness and giddiness: Secondary | ICD-10-CM | POA: Diagnosis not present

## 2021-08-18 NOTE — ED Triage Notes (Signed)
Reports she was diagnosed yesterday evening with chlamydia and BV.  Reports she has felt funny for the last two weeks.  Endorses left flank pain for a week, pelvic pain for the last two weeks.  Now c/o feeling dizzy and nauseated.

## 2021-08-18 NOTE — Telephone Encounter (Signed)
Case started with Medicaid [facility] for MRI cervical spine wo contrast. Pending tracking #[id number]. Phone # [phone]. Notes faxed to [phone].

## 2021-08-19 NOTE — Telephone Encounter (Signed)
Received approval. PA #[id number] (08/18/21- 10/17/21). Sent to [place] for scheduling. Phone: [phone].

## 2021-08-27 ENCOUNTER — Encounter: Payer: Self-pay | Admitting: Family Medicine

## 2021-08-30 MED ORDER — FLUCONAZOLE 150 MG PO TABS: 150.0000 mg | ORAL_TABLET | Freq: Once | ORAL | 0 refills | Status: AC

## 2021-08-31 ENCOUNTER — Other Ambulatory Visit: Payer: Self-pay

## 2021-08-31 ENCOUNTER — Ambulatory Visit
Admission: RE | Admit: 2021-08-31 | Discharge: 2021-08-31 | Disposition: A | Discharge status: Home / Self Care | Payer: Medicaid Other | Source: Ambulatory Visit | Attending: Neurology | Admitting: Neurology

## 2021-08-31 DIAGNOSIS — G8929 Other chronic pain: Secondary | ICD-10-CM

## 2021-08-31 DIAGNOSIS — M5412 Radiculopathy, cervical region: Secondary | ICD-10-CM

## 2021-08-31 DIAGNOSIS — R29898 Other symptoms and signs involving the musculoskeletal system: Secondary | ICD-10-CM

## 2021-08-31 DIAGNOSIS — R270 Ataxia, unspecified: Secondary | ICD-10-CM

## 2021-09-02 ENCOUNTER — Telehealth: Payer: Self-pay | Admitting: *Deleted

## 2021-09-02 DIAGNOSIS — M5412 Radiculopathy, cervical region: Secondary | ICD-10-CM

## 2021-09-02 NOTE — Telephone Encounter (Signed)
-----   Message from Melvenia Beam, MD sent at 09/01/2021  1:56 PM EDT ----- Please call patient and see if she is OK with me sending her to Dr. Vira Blanco for cervical spine injections: MRI of the cervical spine showed some mild arthritic changes at several levels. I think we should send her to Dr. Conception Oms to see if he can do some injections for her for her left arm radiculopathypain. Let me know what she says thanks

## 2021-09-02 NOTE — Telephone Encounter (Signed)
Spoke with patient and discussed MRI cervical spine results.  Patient is agreeable to be referred to Dr. Alva Garnet need to see if he can do some injections for her arm pain.  Referral order placed to Dr Vira Blanco for injections.

## 2021-09-06 ENCOUNTER — Telehealth: Payer: Self-pay | Admitting: *Deleted

## 2021-09-06 NOTE — Telephone Encounter (Signed)
Called pt she is going to [facility] imaging to p/u a copy of her cd.

## 2021-09-06 NOTE — Telephone Encounter (Signed)
Per Hilda Blades, patient will need to pick up from GI.  Referral sent to Dr. Vira Blanco, they will also obtain copy of report from [place]. Phone: [phone].

## 2021-09-08 ENCOUNTER — Telehealth: Payer: Self-pay | Admitting: Neurology

## 2021-09-08 NOTE — Telephone Encounter (Signed)
I called pt and she is c/o since 08-18-21 feelings of dizziness, in a daze, feels drunk , fuzziheaded, back of head feel heavy, her eyes  blurry turning left and right. When still is ok.  Has has episode again 11/4, 11/6, 11/7, 11/9. VSS ok at work.  She is drinking, eating.  No headache.  ?  Migraine.  Has not taken any rizatriptan to see if stops.  I relayed not sure what it is.  She states that feels that she is afraid to drive. Even thought of going to ED.  I relayed that will ask?  Not sure if migraine, will get back to her.

## 2021-09-08 NOTE — Telephone Encounter (Signed)
Pt called stating that recently she has had some episodes of dizziness, nausea, lightheadedness almost like a woozy drunk feeling. Pt states she has been documenting every time this happens and the first incident was on 10/19. Pt would like to discuss with RN.

## 2021-09-09 ENCOUNTER — Ambulatory Visit (INDEPENDENT_AMBULATORY_CARE_PROVIDER_SITE_OTHER): Payer: Medicaid Other | Admitting: Internal Medicine

## 2021-09-09 ENCOUNTER — Encounter: Payer: Self-pay | Admitting: Internal Medicine

## 2021-09-09 VITALS — BP 128/78 | HR 105 | Temp 98.1°F | Resp 16 | Ht 66.0 in | Wt 196.0 lb

## 2021-09-09 DIAGNOSIS — R270 Ataxia, unspecified: Secondary | ICD-10-CM | POA: Diagnosis not present

## 2021-09-09 DIAGNOSIS — G43009 Migraine without aura, not intractable, without status migrainosus: Secondary | ICD-10-CM | POA: Diagnosis not present

## 2021-09-09 DIAGNOSIS — R519 Headache, unspecified: Secondary | ICD-10-CM | POA: Insufficient documentation

## 2021-09-09 DIAGNOSIS — G43709 Chronic migraine without aura, not intractable, without status migrainosus: Secondary | ICD-10-CM | POA: Insufficient documentation

## 2021-09-09 NOTE — Telephone Encounter (Signed)
I called pt I relayed that if acute problem seek care at urgent care or ED for eval.  Not sure if migraine related. Has appt I Jan with Dr. Jaynee Eagles,  Can see if sooner appt with NP, nothing at the moment. Pt agreed and stated would go to ED for evaluation at this time.

## 2021-09-09 NOTE — Patient Instructions (Signed)
General Headache Without Cause A headache is pain or discomfort you feel around the head or neck area. There are many causes and types of headaches. In some cases, the cause may not be found. Follow these instructions at home: Watch your condition for any changes. Let your doctor know about them. Take these steps to help with your condition: Managing pain   Take over-the-counter and prescription medicines only as told by your doctor. This includes medicines for pain that are taken by mouth or put on the skin. Lie down in a dark, quiet room when you have a headache. If told, put ice on your head and neck area: Put ice in a plastic bag. Place a towel between your skin and the bag. Leave the ice on for 20 minutes, 2-3 times per day. Take off the ice if your skin turns bright red. This is very important. If you cannot feel pain, heat, or cold, you have a greater risk of damage to the area. If told, put heat on the affected area. Use the heat source that your doctor recommends, such as a moist heat pack or a heating pad. Place a towel between your skin and the heat source. Leave the heat on for 20-30 minutes. Take off the heat if your skin turns bright red. This is very important. If you cannot feel pain, heat, or cold, you have a greater risk of getting burned. Keep lights dim if bright lights bother you or make your headaches worse. Eating and drinking Eat meals on a regular schedule. If you drink alcohol: Limit how much you have to: 0-1 drink a day for women who are not pregnant. 0-2 drinks a day for men. Know how much alcohol is in a drink. In the [place] one drink equals one 12 oz bottle of beer (355 mL), one 5 oz glass of wine (148 mL), or one 1 oz glass of hard liquor (44 mL). Stop drinking caffeine, or drink less caffeine. General instructions  Keep a journal to find out if certain things bring on headaches. For example, write down: What you eat and drink. How much sleep you get. Any  change to your diet or medicines. Get a massage or try other ways to relax. Limit stress. Sit up straight. Do not tighten (tense) your muscles. Do not smoke or use any products that contain nicotine or tobacco. If you need help quitting, ask your doctor. Exercise regularly as told by your doctor. Get enough sleep. This often means 7-9 hours of sleep each night. Keep all follow-up visits. This is important. Contact a doctor if: Medicine does not help your symptoms. You have a headache that feels different than the other headaches. You feel like you may vomit (nauseous) or you vomit. You have a fever. Get help right away if: Your headache: Gets very bad quickly. Gets worse after a lot of physical activity. You have any of these symptoms: You continue to vomit. A stiff neck. Trouble seeing. Your eye or ear hurts. Trouble speaking. Weak muscles or you lose muscle control. You lose your balance or have trouble walking. You feel like you will pass out (faint) or you pass out. You are mixed up (confused). You have a seizure. These symptoms may be an emergency. Get help right away. Call your local emergency services (911 in the [place] Do not wait to see if the symptoms will go away. Do not drive yourself to the hospital. Summary A headache is pain or discomfort that is felt   around the head or neck area. There are many causes and types of headaches. In some cases, the cause may not be found. Keep a journal to help find out what causes your headaches. Watch your condition for any changes. Let your doctor know about them. Contact a doctor if you have a headache that is different from usual, or if medicine does not help your headache. Get help right away if your headache gets very bad, you throw up, you have trouble seeing, you lose your balance, or you have a seizure. This information is not intended to replace advice given to you by your health care provider. Make sure you discuss any  questions you have with your health care provider. Document Revised: 03/17/2021 Document Reviewed: 03/17/2021 Elsevier Patient Education  2022 [employer] Inc.  

## 2021-09-09 NOTE — Progress Notes (Addendum)
Subjective:  Patient ID: Meghan Reed, female    DOB: [birth year]  Age: 35 y.o. MRN: [id number]  CC: Headache  This visit occurred during the SARS-CoV-2 public health emergency.  Safety protocols were in place, including screening questions prior to the visit, additional usage of staff PPE, and extensive cleaning of exam room while observing appropriate contact time as indicated for disinfecting solutions.    HPI Meghan Reed presents for f/up -   She complains of a 4-day history of posterior headache with dizzy spells, nausea, ataxia, stumbling (veering to the right), slowed thinking, and fatigue.  She has migraines and has not gotten any symptom relief with her usual medications.  She saw a neurologist about a month ago for right-sided neck pain.  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  She has pain that radiates into her upper extremity with numbness and tingling.  She wakes up every morning with a posterior headache.  She describes it as a throbbing sensation. She has about 3 headaches per week.  Outpatient Medications Prior to Visit  Medication Sig Dispense Refill   albuterol (VENTOLIN HFA) 108 (90 Base) MCG/ACT inhaler Inhale 2 puffs into the lungs every 4 (four) hours as needed for wheezing or shortness of breath. 1 each 0   cyclobenzaprine (FLEXERIL) 10 MG tablet Take 1 tablet (10 mg total) by mouth at bedtime as needed for muscle spasms. 60 tablet 0   Elagolix Sodium (ORILISSA) 200 MG TABS Take 1 tablet by mouth in the morning and at bedtime. 60 tablet 6   ferrous sulfate 325 (65 FE) MG tablet Take 1 tablet (325 mg total) by mouth daily with breakfast. 30 tablet 3   nortriptyline (PAMELOR) 25 MG capsule Take 1 capsule (25 mg total) by mouth at bedtime. 30 capsule 3   ondansetron (ZOFRAN-ODT) 8 MG disintegrating tablet Take 0.5-1 tablets (4-8 mg total) by mouth every 8 (eight) hours as needed. 30 tablet 6   rizatriptan (MAXALT-MLT) 10 MG disintegrating  tablet Take 1 tablet (10 mg total) by mouth as needed for migraine. May repeat in 2 hours if needed 9 tablet 11   No facility-administered medications prior to visit.    ROS Review of Systems  Constitutional:  Positive for fatigue. Negative for appetite change, chills and diaphoresis.  HENT: Negative.  Negative for sinus pressure and sore throat.   Eyes:  Negative for photophobia, pain, redness and visual disturbance.  Respiratory:  Negative for cough, chest tightness, shortness of breath and wheezing.   Cardiovascular:  Negative for chest pain, palpitations and leg swelling.  Gastrointestinal:  Positive for nausea. Negative for abdominal pain, blood in stool, diarrhea and vomiting.  Genitourinary: Negative.  Negative for difficulty urinating.  Musculoskeletal:  Positive for neck pain. Negative for arthralgias and neck stiffness.  Skin:  Negative for color change, pallor and rash.  Neurological:  Positive for dizziness, numbness and headaches. Negative for tremors, seizures, syncope, facial asymmetry, speech difficulty, weakness and light-headedness.  Hematological:  Negative for adenopathy. Does not bruise/bleed easily.  Psychiatric/Behavioral: Negative.     Objective:  BP 128/78 (BP Location: Right Arm, Patient Position: Sitting, Cuff Size: Large)   Pulse (!) 105   Temp 98.1 F (36.7 C) (Oral)   Resp 16   Ht 5\' 6"  (1.676 m)   Wt 196 lb (88.9 kg)   LMP 08/14/2021 (Exact Date) Comment: BTL  SpO2 98%   BMI 31.64 kg/m   BP Readings from Last 3 Encounters:  09/09/21 128/78  08/18/21 Marland Kitchen)  138/93  08/16/21 105/77    Wt Readings from Last 3 Encounters:  09/09/21 196 lb (88.9 kg)  08/18/21 198 lb (89.8 kg)  08/16/21 198 lb 12.8 oz (90.2 kg)    Physical Exam Eyes:     General: No visual field deficit.    Extraocular Movements: Extraocular movements intact.     Right eye: Normal extraocular motion.     Left eye: Normal extraocular motion and no nystagmus.     Pupils: Pupils  are equal, round, and reactive to light. Pupils are equal.  Cardiovascular:     Rate and Rhythm: Normal rate and regular rhythm.     Heart sounds: No murmur heard. Pulmonary:     Effort: Pulmonary effort is normal.     Breath sounds: No stridor. No wheezing, rhonchi or rales.  Abdominal:     General: There is no distension.     Palpations: Abdomen is soft.  Musculoskeletal:        General: Tenderness present. Normal range of motion.     Cervical back: Normal range of motion and neck supple. No rigidity.  Skin:    General: Skin is warm.  Neurological:     Mental Status: She is alert and oriented to person, place, and time. Mental status is at baseline.     Cranial Nerves: No cranial nerve deficit, dysarthria or facial asymmetry.     Sensory: Sensation is intact. No sensory deficit.     Motor: No weakness, tremor or abnormal muscle tone.     Coordination: Romberg sign positive. Coordination normal. Finger-Nose-Finger Test abnormal. Heel to [facility] Test normal. Impaired rapid alternating movements.     Gait: Gait and tandem walk normal.     Deep Tendon Reflexes: Reflexes normal. Babinski sign absent on the right side. Babinski sign absent on the left side.  Psychiatric:        Mood and Affect: Mood normal.        Behavior: Behavior normal.    Lab Results  Component Value Date   WBC 7.7 05/31/2021   HGB 13.3 05/31/2021   HCT 39.8 05/31/2021   PLT 265 05/31/2021   GLUCOSE 92 05/31/2021   CHOL 234 (H) 08/16/2021   TRIG 78 08/16/2021   HDL 55 08/16/2021   LDLCALC 165 (H) 08/16/2021   ALT 18 05/31/2021   AST 17 05/31/2021   NA 137 05/31/2021   K 4.3 05/31/2021   CL 102 05/31/2021   CREATININE 0.74 05/31/2021   BUN 9 05/31/2021   CO2 27 05/31/2021   TSH 0.890 10/14/2020   INR 1.5 (H) 10/31/2019   HGBA1C 5.2 08/12/2019    MR CERVICAL SPINE WO CONTRAST  Result Date: 09/01/2021 CLINICAL DATA:  Neck pain, radiating into arms bilaterally, numbness in right hand EXAM: MRI CERVICAL  SPINE WITHOUT CONTRAST TECHNIQUE: Multiplanar, multisequence MR imaging of the cervical spine was performed. No intravenous contrast was administered. COMPARISON:  None. FINDINGS: Alignment: Straightening of the normal cervical lordosis. No listhesis. Vertebrae: No fracture, evidence of discitis, or bone lesion. Cord: Normal signal and morphology. Posterior Fossa, vertebral arteries, paraspinal tissues: Negative. Disc levels: C2-C3: No significant disc bulge. No spinal canal stenosis or neuroforaminal narrowing. C3-C4: No significant disc bulge. No spinal canal stenosis or neuroforaminal narrowing. C4-C5: No significant disc bulge. No spinal canal stenosis or neuroforaminal narrowing. C5-C6: No significant disc bulge. No spinal canal stenosis. Mild facet arthropathy. Mild left neural foraminal narrowing. C6-C7: No significant disc bulge. No spinal canal stenosis. Mild facet arthropathy. Mild  left neural foraminal narrowing. C7-T1: No significant disc bulge. No spinal canal stenosis. Mild facet arthropathy. Mild left-greater-than-right neural foraminal narrowing. IMPRESSION: Mild degenerative changes in the lower cervical spine, with mild neural foraminal narrowing the left C5-C6 and C6-C7 and bilaterally at C7-T1. Electronically Signed   By: Merilyn Baba M.D.   On: 09/01/2021 13:25    Assessment & Plan:   Arynn was seen today for headache.  Diagnoses and all orders for this visit:  Acute intractable headache, unspecified headache type -     MR Brain Wo Contrast; Future  Ataxia- She has a constellation of symptoms and abnormal findings on neurological exam that are concerning for a CNS event.  I recommended she undergo a stat MRI to screen for CVA, bleed, demyelination, NPH, and tumor. -     MR Brain Wo Contrast; Future  Migraine without aura and without status migrainosus, not intractable -     MR Brain Wo Contrast; Future  I am having Meghan Reed maintain her ferrous sulfate,  cyclobenzaprine, albuterol, [place], nortriptyline, rizatriptan, and ondansetron.  No orders of the defined types were placed in this encounter.    Follow-up: Return in about 1 week (around 09/16/2021).  Scarlette Calico, MD

## 2021-09-13 ENCOUNTER — Ambulatory Visit (HOSPITAL_COMMUNITY)
Admission: RE | Admit: 2021-09-13 | Discharge: 2021-09-13 | Disposition: A | Discharge status: Home / Self Care | Payer: Medicaid Other | Source: Ambulatory Visit | Attending: Internal Medicine | Admitting: Internal Medicine

## 2021-09-13 DIAGNOSIS — G43009 Migraine without aura, not intractable, without status migrainosus: Secondary | ICD-10-CM | POA: Diagnosis present

## 2021-09-13 DIAGNOSIS — R519 Headache, unspecified: Secondary | ICD-10-CM

## 2021-09-13 DIAGNOSIS — R270 Ataxia, unspecified: Secondary | ICD-10-CM | POA: Diagnosis present

## 2021-09-14 MED ORDER — UBRELVY 100 MG PO TABS: 1.0000 | ORAL_TABLET | Freq: Every day | ORAL | 2 refills | Status: DC | PRN

## 2021-09-14 NOTE — Addendum Note (Signed)
Addended by: Janith Lima on: 09/14/2021 09:49 AM   Modules accepted: Orders

## 2021-09-15 ENCOUNTER — Telehealth: Payer: Self-pay

## 2021-09-15 NOTE — Telephone Encounter (Signed)
Key: Shelly

## 2021-09-16 ENCOUNTER — Other Ambulatory Visit: Payer: Self-pay | Admitting: Internal Medicine

## 2021-09-16 MED ORDER — QULIPTA 60 MG PO TABS: 1.0000 | ORAL_TABLET | Freq: Every day | ORAL | 1 refills | Status: DC

## 2021-09-16 NOTE — Addendum Note (Signed)
Addended by: Janith Lima on: 09/16/2021 02:56 PM   Modules accepted: Orders

## 2021-09-21 ENCOUNTER — Other Ambulatory Visit: Payer: Self-pay | Admitting: Internal Medicine

## 2021-09-21 DIAGNOSIS — G43009 Migraine without aura, not intractable, without status migrainosus: Secondary | ICD-10-CM

## 2021-09-21 MED ORDER — EMGALITY 120 MG/ML ~~LOC~~ SOAJ: 1.0000 | SUBCUTANEOUS | 0 refills | Status: DC

## 2021-09-21 NOTE — Telephone Encounter (Signed)
PA was denied. Please advise

## 2021-10-14 ENCOUNTER — Ambulatory Visit (INDEPENDENT_AMBULATORY_CARE_PROVIDER_SITE_OTHER): Payer: Medicaid Other | Admitting: Internal Medicine

## 2021-10-14 ENCOUNTER — Telehealth: Payer: Self-pay | Admitting: *Deleted

## 2021-10-14 ENCOUNTER — Encounter: Payer: Self-pay | Admitting: Internal Medicine

## 2021-10-14 VITALS — BP 132/82 | HR 104

## 2021-10-14 DIAGNOSIS — J4541 Moderate persistent asthma with (acute) exacerbation: Secondary | ICD-10-CM | POA: Diagnosis not present

## 2021-10-14 DIAGNOSIS — J45901 Unspecified asthma with (acute) exacerbation: Secondary | ICD-10-CM | POA: Insufficient documentation

## 2021-10-14 MED ORDER — PREDNISONE 20 MG PO TABS: 40.0000 mg | ORAL_TABLET | Freq: Every day | ORAL | 0 refills | Status: AC

## 2021-10-14 MED ORDER — IPRATROPIUM-ALBUTEROL 0.5-2.5 (3) MG/3ML IN SOLN
3.0000 mL | Freq: Once | RESPIRATORY_TRACT | Status: AC
  Administered 2021-10-14: 3 mL via RESPIRATORY_TRACT

## 2021-10-14 MED ORDER — FLUTICASONE PROPIONATE HFA 110 MCG/ACT IN AERO: 2.0000 | INHALATION_SPRAY | Freq: Two times a day (BID) | RESPIRATORY_TRACT | 5 refills | Status: DC

## 2021-10-14 MED ORDER — METHYLPREDNISOLONE ACETATE 80 MG/ML IJ SUSP
80.0000 mg | Freq: Once | INTRAMUSCULAR | Status: AC
  Administered 2021-10-14: 80 mg via INTRAMUSCULAR

## 2021-10-14 MED ORDER — HYDROCOD POLST-CPM POLST ER 10-8 MG/5ML PO SUER: 5.0000 mL | Freq: Two times a day (BID) | ORAL | 0 refills | Status: DC | PRN

## 2021-10-14 MED ORDER — BENZONATATE 200 MG PO CAPS: 200.0000 mg | ORAL_CAPSULE | Freq: Three times a day (TID) | ORAL | 0 refills | Status: DC | PRN

## 2021-10-14 MED ORDER — ALBUTEROL SULFATE (2.5 MG/3ML) 0.083% IN NEBU: 2.5000 mg | INHALATION_SOLUTION | Freq: Four times a day (QID) | RESPIRATORY_TRACT | 1 refills | Status: DC | PRN

## 2021-10-14 NOTE — Assessment & Plan Note (Signed)
Acute Started last night - likely related to recent URI, weather changes Diffuse wheezing on exams, mild tachypnea No evidence of bacterial infection so no abx needed Neb treatment here provided some relief and albuterol solution called in - has neb machine at home Depo- medrol 80 mg IM x 1 Prednisone 40 mg daily x 5 days starting tomorrow Maintenance inhaler given to use now and prn to prevent flares Tessalon perles during day Out of work today tussionex cough syrup at night

## 2021-10-14 NOTE — Patient Instructions (Addendum)
° °  You had a steroid injection today.    Medications changes include :  prednisone start tomorrow.  Cough medications.  Your prescription(s) have been submitted to your pharmacy. Please take as directed and contact our office if you believe you are having problem(s) with the medication(s).
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
## 2021-10-14 NOTE — Telephone Encounter (Signed)
Patient calls from work, reporting that she is having trouble breathing and the inhaler is not working.  Pt is working very hard to breath and can only say 1 -2 words without taking a breath.  Advised that she needed to be seen ASAP and that since we did not have appts she would need to go to an UC or the ED.  Advised patient to call 911 or have someone drive her as I did not think it was safe for there to drive.  Patient agreeable to plan. Christen Bame, CMA

## 2021-10-15 ENCOUNTER — Other Ambulatory Visit: Payer: Self-pay | Admitting: Internal Medicine

## 2021-10-15 MED ORDER — ALBUTEROL SULFATE (2.5 MG/3ML) 0.083% IN NEBU: 2.5000 mg | INHALATION_SOLUTION | Freq: Four times a day (QID) | RESPIRATORY_TRACT | 1 refills | Status: DC | PRN

## 2021-10-15 MED ORDER — HYDROCOD POLST-CPM POLST ER 10-8 MG/5ML PO SUER: 5.0000 mL | Freq: Two times a day (BID) | ORAL | 0 refills | Status: DC | PRN

## 2021-10-18 ENCOUNTER — Encounter: Payer: Self-pay | Admitting: Family Medicine

## 2021-10-22 ENCOUNTER — Encounter: Payer: Self-pay | Admitting: Emergency Medicine

## 2021-10-22 ENCOUNTER — Other Ambulatory Visit: Payer: Self-pay

## 2021-10-22 ENCOUNTER — Ambulatory Visit
Admission: EM | Admit: 2021-10-22 | Discharge: 2021-10-22 | Disposition: A | Discharge status: Home / Self Care | Payer: Medicaid Other | Attending: Physician Assistant | Admitting: Physician Assistant

## 2021-10-22 DIAGNOSIS — Z113 Encounter for screening for infections with a predominantly sexual mode of transmission: Secondary | ICD-10-CM | POA: Insufficient documentation
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
## 2021-10-22 NOTE — ED Triage Notes (Signed)
Believes her boyfriend cheated. Says she was last treated for chlamydia in October, having same discharge again. Only requesting the swab today

## 2021-10-26 LAB — CERVICOVAGINAL ANCILLARY ONLY
Bacterial Vaginitis (gardnerella): POSITIVE — AB
Candida Glabrata: NEGATIVE
Candida Vaginitis: NEGATIVE
Chlamydia: NEGATIVE
Comment: NEGATIVE
Comment: NEGATIVE
Comment: NEGATIVE
Comment: NEGATIVE
Comment: NEGATIVE
Comment: NORMAL
Neisseria Gonorrhea: NEGATIVE
Trichomonas: NEGATIVE

## 2021-10-27 ENCOUNTER — Telehealth (HOSPITAL_COMMUNITY): Payer: Self-pay | Admitting: Emergency Medicine

## 2021-10-27 MED ORDER — METRONIDAZOLE 500 MG PO TABS: 500.0000 mg | ORAL_TABLET | Freq: Two times a day (BID) | ORAL | 0 refills | Status: DC

## 2021-10-27 MED ORDER — FLUCONAZOLE 150 MG PO TABS: 150.0000 mg | ORAL_TABLET | Freq: Once | ORAL | 0 refills | Status: AC

## 2021-10-31 DIAGNOSIS — N938 Other specified abnormal uterine and vaginal bleeding: Secondary | ICD-10-CM

## 2021-10-31 HISTORY — DX: Other specified abnormal uterine and vaginal bleeding: N93.8

## 2021-11-15 ENCOUNTER — Telehealth (INDEPENDENT_AMBULATORY_CARE_PROVIDER_SITE_OTHER): Payer: Self-pay | Admitting: Neurology

## 2021-11-15 ENCOUNTER — Encounter: Payer: Self-pay | Admitting: Neurology

## 2021-11-15 ENCOUNTER — Telehealth: Payer: Self-pay | Admitting: Neurology

## 2021-11-15 DIAGNOSIS — Z91199 Patient's noncompliance with other medical treatment and regimen due to unspecified reason: Secondary | ICD-10-CM

## 2021-11-15 NOTE — Telephone Encounter (Signed)
Meghan Reed no showed her new patient appointment today.  .  This is her second no-show within a few months.  This is her 2nd no show in a few months here at [facility], neuro.  Do NOT reschedule unless discussed with Dr. Jaynee Eagles.

## 2021-11-15 NOTE — Progress Notes (Addendum)
Was 30 minutes late for appointent. Had a problem getting online. Will be rescheduled. Her MRi came back negative, we cannot find a reason for her chronic neck and arm symptoms that are keeping her out of work, had emg/ncs, MRI, workup, we cannot continue any short-term or long-term disability. She can be sent to pain management for neck injectuons or sports management. See Meghan Reed this week,       .

## 2021-11-16 NOTE — Patient Instructions (Signed)
Below is our plan:  We will continue [place]. Take Emgality every 30 days. Take Ubrelvy as needed. Please take 1 tablet at onset of headache. May take 1 additional tablet in 2 hours if needed. Do not take more than 2 tablets in 24 hours or more than 8 in a month. Continue close follow up with Dr Vira Blanco.    Please make sure you are staying well hydrated. I recommend 50-60 ounces daily. Well balanced diet and regular exercise encouraged. Consistent sleep schedule with 6-8 hours recommended.   Please continue follow up with care team as directed.   Follow up with me in 6 - 57months   You may receive a survey regarding today's visit. I encourage you to leave honest feed back as I do use this information to improve patient care. Thank you for seeing me today!
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
## 2021-11-17 ENCOUNTER — Encounter: Payer: Self-pay | Admitting: Family Medicine

## 2021-11-17 ENCOUNTER — Other Ambulatory Visit: Payer: Self-pay

## 2021-11-17 ENCOUNTER — Ambulatory Visit: Payer: Medicaid Other | Admitting: Family Medicine

## 2021-11-17 VITALS — BP 107/86 | HR 88 | Ht 66.0 in | Wt 200.0 lb

## 2021-11-17 DIAGNOSIS — G43009 Migraine without aura, not intractable, without status migrainosus: Secondary | ICD-10-CM | POA: Diagnosis not present

## 2021-11-17 DIAGNOSIS — M79601 Pain in right arm: Secondary | ICD-10-CM | POA: Diagnosis not present

## 2021-11-17 DIAGNOSIS — M542 Cervicalgia: Secondary | ICD-10-CM | POA: Diagnosis not present

## 2021-11-17 DIAGNOSIS — M79602 Pain in left arm: Secondary | ICD-10-CM

## 2021-11-17 DIAGNOSIS — G8929 Other chronic pain: Secondary | ICD-10-CM | POA: Diagnosis not present

## 2021-11-17 MED ORDER — UBRELVY 100 MG PO TABS: 1.0000 | ORAL_TABLET | Freq: Every day | ORAL | 11 refills | Status: DC | PRN

## 2021-11-17 MED ORDER — EMGALITY 120 MG/ML ~~LOC~~ SOAJ: 1.0000 | SUBCUTANEOUS | 0 refills | Status: DC

## 2021-11-18 ENCOUNTER — Telehealth: Payer: Self-pay

## 2021-11-18 NOTE — Telephone Encounter (Signed)
Called and LVM, needing pts new insurance card in order to submit PA for Ubrelvy. Pt can stop by our office or submit/upload it through [facility].

## 2021-11-23 ENCOUNTER — Telehealth: Payer: Self-pay

## 2021-11-23 ENCOUNTER — Other Ambulatory Visit: Payer: Self-pay

## 2021-11-23 MED ORDER — EMGALITY 120 MG/ML ~~LOC~~ SOAJ: 120.0000 mg | SUBCUTANEOUS | 11 refills | Status: DC

## 2021-11-23 NOTE — Telephone Encounter (Signed)
Approved. This drug has been approved.  Approved quantity: 8 tablets per 30 day(s).  You may fill up to a 34 day supply at a retail pharmacy.  You may fill up to a 90 day supply for maintenance drugs, please refer to the formulary for details. Please call the pharmacy to process your prescription claim.

## 2021-11-23 NOTE — Telephone Encounter (Signed)
Submitted PA on CMM.  Key: [id number].  Waiting on determination from [facility].

## 2021-11-23 NOTE — Telephone Encounter (Signed)
Submitted PA on CMM.  Key: [id number].  Waiting on determination from [facility].

## 2021-11-23 NOTE — Telephone Encounter (Addendum)
This drug has been approved.  Approved quantity: 1 mL per 30 day(s).  You may fill up to a 34 day supply at a retail pharmacy. You may fill up to a 90 day supply for maintenance drugs, please refer to the formulary for details. Please call the pharmacy to process your prescription claim.  Start 11/23/21-02/22/23

## 2021-12-01 DIAGNOSIS — N730 Acute parametritis and pelvic cellulitis: Secondary | ICD-10-CM

## 2021-12-01 HISTORY — DX: Acute parametritis and pelvic cellulitis: N73.0

## 2021-12-06 ENCOUNTER — Emergency Department (HOSPITAL_BASED_OUTPATIENT_CLINIC_OR_DEPARTMENT_OTHER)
Admission: EM | Admit: 2021-12-06 | Discharge: 2021-12-06 | Disposition: A | Discharge status: Home / Self Care | Payer: Medicaid Other | Attending: Emergency Medicine | Admitting: Emergency Medicine

## 2021-12-06 ENCOUNTER — Telehealth: Payer: Self-pay

## 2021-12-06 ENCOUNTER — Ambulatory Visit: Payer: Medicaid Other | Admitting: Student

## 2021-12-06 ENCOUNTER — Other Ambulatory Visit: Payer: Self-pay

## 2021-12-06 ENCOUNTER — Emergency Department (HOSPITAL_BASED_OUTPATIENT_CLINIC_OR_DEPARTMENT_OTHER): Payer: Medicaid Other

## 2021-12-06 ENCOUNTER — Encounter (HOSPITAL_BASED_OUTPATIENT_CLINIC_OR_DEPARTMENT_OTHER): Payer: Self-pay | Admitting: Urology

## 2021-12-06 ENCOUNTER — Emergency Department (HOSPITAL_BASED_OUTPATIENT_CLINIC_OR_DEPARTMENT_OTHER): Payer: Medicaid Other | Admitting: Radiology

## 2021-12-06 DIAGNOSIS — M79604 Pain in right leg: Secondary | ICD-10-CM | POA: Insufficient documentation

## 2021-12-06 DIAGNOSIS — R0789 Other chest pain: Secondary | ICD-10-CM | POA: Insufficient documentation

## 2021-12-06 DIAGNOSIS — R52 Pain, unspecified: Secondary | ICD-10-CM

## 2021-12-06 DIAGNOSIS — D72829 Elevated white blood cell count, unspecified: Secondary | ICD-10-CM | POA: Diagnosis not present

## 2021-12-06 LAB — D-DIMER, QUANTITATIVE: D-Dimer, Quant: 0.47 ug/mL-FEU (ref 0.00–0.50)

## 2021-12-06 LAB — CBC WITH DIFFERENTIAL/PLATELET
Abs Immature Granulocytes: 0.04 10*3/uL (ref 0.00–0.07)
Basophils Absolute: 0 10*3/uL (ref 0.0–0.1)
Basophils Relative: 0 %
Eosinophils Absolute: 0.1 10*3/uL (ref 0.0–0.5)
Eosinophils Relative: 1 %
HCT: 40.6 % (ref 36.0–46.0)
Hemoglobin: 13.7 g/dL (ref 12.0–15.0)
Immature Granulocytes: 0 %
Lymphocytes Relative: 25 %
Lymphs Abs: 2.7 10*3/uL (ref 0.7–4.0)
MCH: 30 pg (ref 26.0–34.0)
MCHC: 33.7 g/dL (ref 30.0–36.0)
MCV: 89 fL (ref 80.0–100.0)
Monocytes Absolute: 0.8 10*3/uL (ref 0.1–1.0)
Monocytes Relative: 7 %
Neutro Abs: 7.1 10*3/uL (ref 1.7–7.7)
Neutrophils Relative %: 67 %
Platelets: 306 10*3/uL (ref 150–400)
RBC: 4.56 MIL/uL (ref 3.87–5.11)
RDW: 13 % (ref 11.5–15.5)
WBC: 10.7 10*3/uL — ABNORMAL HIGH (ref 4.0–10.5)
nRBC: 0 % (ref 0.0–0.2)

## 2021-12-06 LAB — BASIC METABOLIC PANEL
Anion gap: 9 (ref 5–15)
BUN: 10 mg/dL (ref 6–20)
CO2: 27 mmol/L (ref 22–32)
Calcium: 9.3 mg/dL (ref 8.9–10.3)
Chloride: 103 mmol/L (ref 98–111)
Creatinine, Ser: 0.68 mg/dL (ref 0.44–1.00)
GFR, Estimated: >60 mL/min (ref >60)
Glucose, Bld: 90 mg/dL (ref 70–99)
Potassium: 3.9 mmol/L (ref 3.5–5.1)
Sodium: 139 mmol/L (ref 135–145)

## 2021-12-06 LAB — HCG, SERUM, QUALITATIVE: Preg, Serum: NEGATIVE

## 2021-12-06 NOTE — Telephone Encounter (Signed)
Patient calls nurse line regarding R sided calf pain since Saturday. Denies swelling, redness or streaking. 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 Denies current [facility] or chest pain. Patient is requesting appointment for further evaluation and imaging.   Scheduled patient this afternoon at 1:45. ED precautions given. Patient states that she is unsure if she can find coverage for 1:45 appointment. Advised patient that if she is not able to come in for this appointment, she would need to be seen in urgent care. Patient verbalizes understanding. Patient will call our office back if she is unable to keep this appointment.   Talbot Grumbling, RN

## 2021-12-06 NOTE — ED Notes (Signed)
Patient transported to X-ray 

## 2021-12-06 NOTE — Discharge Instructions (Addendum)
Your work-up was negative for blood clot today.  Does not appear to be any emergent problem with your heart or lungs at this time.  Suspect you are experiencing muscular soreness from your recent travels.  Use Tylenol and Motrin as needed for your symptoms and may follow-up with your primary care doctor.  Return to the ER with any worsening chest pain, difficulty breathing, nausea or vomiting that does not stop, or any other new serious symptoms.

## 2021-12-06 NOTE — ED Triage Notes (Signed)
Left calf pain that started Saturday no injury  States tightness in leg H/o PE  Sent her by PCP to r/o DVT

## 2021-12-06 NOTE — ED Provider Notes (Signed)
[facility] EMERGENCY DEPT Provider Note   CSN: [id number] Arrival date & time: 12/06/21  1206     History  Chief Complaint  Patient presents with   Leg Pain    Right Leg, Per pt    Meghan Reed is a 36 y.o. female who has history of PE during pregnancy who presents with concern for progressively worsening right calf pain that started on Saturday with tenderness to palpation as well as pain in the right chest wall.  She does state that she has pain with deep inspiration which is similar to when she was diagnosed with her PE in the past.  Of note she did recently drive 12 hours each way to and from the work and she is on OCPs for management of her endometriosis.  She has history of tubal ligation in the past.  She was in here by her PCP to rule out DVT in the leg.  I have personally reviewed her medical records.  She has history of migraines, PTSD, PE in pregnancy.  She was on anticoagulation for 6 months at that time but has not been on any since that time.  She does have family history of blood clots.  She is not currently anticoagulated.  HPI     Home Medications Prior to Admission medications   Medication Sig Start Date End Date Taking? Authorizing Provider  albuterol (PROVENTIL) (2.5 MG/3ML) 0.083% nebulizer solution Take 3 mLs (2.5 mg total) by nebulization every 6 (six) hours as needed for wheezing or shortness of breath. 10/15/21   Binnie Rail, MD  albuterol (VENTOLIN HFA) 108 (90 Base) MCG/ACT inhaler Inhale 2 puffs into the lungs every 4 (four) hours as needed for wheezing or shortness of breath. 11/10/20   Benay Pike, MD  chlorpheniramine-HYDROcodone [facility]) 10-8 MG/5ML SUER Take 5 mLs by mouth every 12 (twelve) hours as needed for cough. 10/15/21   Binnie Rail, MD  cyclobenzaprine (FLEXERIL) 10 MG tablet Take 1 tablet (10 mg total) by mouth at bedtime as needed for muscle spasms. 07/03/20   Richarda Osmond, MD   Elagolix Sodium (ORILISSA) 200 MG TABS Take 1 tablet by mouth in the morning and at bedtime. 12/03/20   Woodroe Mode, MD  ferrous sulfate 325 (65 FE) MG tablet Take 1 tablet (325 mg total) by mouth daily with breakfast. 11/02/19   Woodroe Mode, MD  fluticasone (FLOVENT HFA) 110 MCG/ACT inhaler Inhale 2 puffs into the lungs 2 (two) times daily. 10/14/21   Burns, Claudina Lick, MD  Galcanezumab-gnlm [facility]) 120 MG/ML SOAJ Inject 1 Act into the skin every 30 (thirty) days. 11/17/21   Lomax, Amy, NP  Galcanezumab-gnlm (EMGALITY) 120 MG/ML SOAJ Inject 120 mg into the skin every 30 (thirty) days. 11/23/21   Lomax, Amy, NP  nortriptyline (PAMELOR) 25 MG capsule Take 1 capsule (25 mg total) by mouth at bedtime. 08/13/21   Melvenia Beam, MD  ondansetron (ZOFRAN-ODT) 8 MG disintegrating tablet Take 0.5-1 tablets (4-8 mg total) by mouth every 8 (eight) hours as needed. 08/13/21   Melvenia Beam, MD  Ubrogepant (UBRELVY) 100 MG TABS Take 1 tablet by mouth daily as needed. 11/17/21   Lomax, Amy, NP      Allergies    Tomato    Review of Systems   Review of Systems  Constitutional: Negative.   HENT: Negative.    Eyes: Negative.   Respiratory: Negative.  Negative for cough, chest tightness and shortness of breath.  Cardiovascular:  Positive for chest pain.       Calf pain  Gastrointestinal: Negative.   Genitourinary: Negative.   Musculoskeletal: Negative.   Skin: Negative.   Neurological: Negative.   Hematological: Negative.    Physical Exam Updated Vital Signs BP 103/81    Pulse 75    Temp 98.6 F (37 C)    Resp 16    Ht 5\' 6"  (1.676 m)    Wt 88.5 kg    SpO2 100%    BMI 31.47 kg/m  Physical Exam Vitals and nursing note reviewed.  Constitutional:      Appearance: She is not ill-appearing or toxic-appearing.  HENT:     Head: Normocephalic and atraumatic.     Nose: Nose normal.     Mouth/Throat:     Mouth: Mucous membranes are moist.     Pharynx: No oropharyngeal exudate or posterior  oropharyngeal erythema.  Eyes:     General:        Right eye: No discharge.        Left eye: No discharge.     Extraocular Movements: Extraocular movements intact.     Conjunctiva/sclera: Conjunctivae normal.     Pupils: Pupils are equal, round, and reactive to light.  Cardiovascular:     Rate and Rhythm: Normal rate and regular rhythm.     Pulses: Normal pulses.     Heart sounds: Normal heart sounds. No murmur heard.    Comments: Left calf TTP without swelling, erythema, or warmth to the touch. Pulmonary:     Effort: Pulmonary effort is normal. No tachypnea, bradypnea, accessory muscle usage, prolonged expiration or respiratory distress.     Breath sounds: Normal breath sounds. No wheezing or rales.  Chest:     Chest wall: No mass, lacerations, deformity, swelling, tenderness or crepitus. There is no dullness to percussion.    Abdominal:     General: Bowel sounds are normal. There is no distension.     Palpations: Abdomen is soft.     Tenderness: There is no abdominal tenderness. There is no right CVA tenderness, left CVA tenderness, guarding or rebound.  Musculoskeletal:        General: Tenderness present. No deformity.     Cervical back: Neck supple. No tenderness.     Right lower leg: No edema.     Left lower leg: No edema.  Lymphadenopathy:     Cervical: No cervical adenopathy.  Skin:    General: Skin is warm and dry.     Capillary Refill: Capillary refill takes less than 2 seconds.     Findings: No rash.  Neurological:     General: No focal deficit present.     Mental Status: She is alert and oriented to person, place, and time. Mental status is at baseline.  Psychiatric:        Mood and Affect: Mood normal.    ED Results / Procedures / Treatments   Labs (all labs ordered are listed, but only abnormal results are displayed) Labs Reviewed  CBC WITH DIFFERENTIAL/PLATELET - Abnormal; Notable for the following components:      Result Value   WBC 10.7 (*)    All  other components within normal limits  D-DIMER, QUANTITATIVE  BASIC METABOLIC PANEL  HCG, SERUM, QUALITATIVE    EKG None  Radiology DG Chest 2 View  Result Date: 12/06/2021 CLINICAL DATA:  RIGHT-side chest pain with inspiration, history of pulmonary embolism EXAM: CHEST - 2 VIEW COMPARISON:  05/31/2021 FINDINGS: Normal  heart size, mediastinal contours, and pulmonary vascularity. Lungs clear. No pleural effusion or pneumothorax. Bones unremarkable. IMPRESSION: Normal exam. Electronically Signed   By: Lavonia Dana M.D.   On: 12/06/2021 15:41   US Venous Img Lower Unilateral Right (DVT)  Result Date: 12/06/2021 CLINICAL DATA:  Three day history of calf pain. EXAM: RIGHT LOWER EXTREMITY VENOUS DOPPLER ULTRASOUND TECHNIQUE: Gray-scale sonography with compression, as well as color and duplex ultrasound, were performed to evaluate the deep venous system(s) from the level of the common femoral vein through the popliteal and proximal calf veins. COMPARISON:  None. FINDINGS: VENOUS Normal compressibility of the common femoral, superficial femoral, and popliteal veins, as well as the visualized calf veins. Visualized portions of profunda femoral vein and great saphenous vein unremarkable. No filling defects to suggest DVT on grayscale or color Doppler imaging. Doppler waveforms show normal direction of venous flow, normal respiratory plasticity and response to augmentation. Limited views of the contralateral common femoral vein are unremarkable. OTHER None. Limitations: none IMPRESSION: Negative. Electronically Signed   By: Jacqulynn Cadet M.D.   On: 12/06/2021 13:19    Procedures Procedures    Medications Ordered in ED Medications - No data to display  ED Course/ Medical Decision Making/ A&P                           Medical Decision Making 36 year old female with history of PE during pregnancy who has recent prolonged travel and oral contraception who presents with concern for left calf pain and  right-sided chest pain with deep inspiration.  Pain does not radiate nor does she have any associated palpitations or shortness of breath.  Differential diagnosis includes was limited to DVT/PE, pleurisy, dysrhythmia, musculoskeletal injury, pneumonia.  Cardiopulmonary and abdominal exams are benign.  Patient with mild tenderness palpation of the right calf without erythema, warmth to touch, or swelling.  Amount and/or Complexity of Data Reviewed Labs: ordered.    Details: CBC with mild leukocytosis of 10.  BMP unremarkable, pregnancy test is negative, dimer is negative. Radiology: ordered.    Details: Chest x-ray negative for acute cardiopulmonary disease and Doppler ultrasound of the right lower extremity was negative for DVT. ECG/medicine tests: ordered.    Details: NSR, no STEMI.  Overall patient's work-up is very reassuring.  Given negative dimer doubt PE or DVT.  Patient is low risk for ACS.  EKG without dysrhythmia or STEMI.  Suspect possible pleurisy and questionable muscular soreness in the right calf from recent prolonged drive without use of cruise control or breaks to stretch the leg while driving.  She may follow-up with her primary care doctor.  May use Tylenol or Motrin as needed for her muscular soreness.  No further report and there is something reassuring work-up and normal vital signs at this time.  Meghan Reed voiced understanding for medical evaluation and treatment plan.  Each of her questions answered to her expressed satisfaction.  Return precautions were given.  Patient is well-appearing, stable, and was discharged in good condition.  This chart was dictated using voice recognition software, Dragon. Despite the best efforts of this provider to proofread and correct errors, errors may still occur which can change documentation meaning.  Final Clinical Impression(s) / ED Diagnoses Final diagnoses:  Pain    Rx / DC Orders ED Discharge Orders     None          Aura Dials 12/06/21 1730    Hayden Rasmussen,  MD 12/07/21 [phone]

## 2021-12-17 ENCOUNTER — Other Ambulatory Visit (HOSPITAL_COMMUNITY)
Admission: RE | Admit: 2021-12-17 | Discharge: 2021-12-17 | Disposition: A | Discharge status: Home / Self Care | Payer: Medicaid Other | Source: Ambulatory Visit | Attending: Family Medicine | Admitting: Family Medicine

## 2021-12-17 ENCOUNTER — Other Ambulatory Visit: Payer: Self-pay

## 2021-12-17 ENCOUNTER — Ambulatory Visit (INDEPENDENT_AMBULATORY_CARE_PROVIDER_SITE_OTHER): Payer: Medicaid Other | Admitting: Family Medicine

## 2021-12-17 ENCOUNTER — Telehealth: Payer: Self-pay

## 2021-12-17 VITALS — BP 108/86 | HR 108 | Ht 66.0 in | Wt 205.4 lb

## 2021-12-17 DIAGNOSIS — R42 Dizziness and giddiness: Secondary | ICD-10-CM | POA: Diagnosis not present

## 2021-12-17 DIAGNOSIS — N739 Female pelvic inflammatory disease, unspecified: Secondary | ICD-10-CM

## 2021-12-17 DIAGNOSIS — R102 Pelvic and perineal pain: Secondary | ICD-10-CM | POA: Insufficient documentation

## 2021-12-17 LAB — POCT URINE PREGNANCY: Preg Test, Ur: NEGATIVE

## 2021-12-17 LAB — POCT WET PREP (WET MOUNT)
Clue Cells Wet Prep Whiff POC: NEGATIVE
Trichomonas Wet Prep HPF POC: ABSENT

## 2021-12-17 MED ORDER — DOXYCYCLINE HYCLATE 100 MG PO TABS: 100.0000 mg | ORAL_TABLET | Freq: Two times a day (BID) | ORAL | 0 refills | Status: AC

## 2021-12-17 MED ORDER — METRONIDAZOLE 500 MG PO TABS: 500.0000 mg | ORAL_TABLET | Freq: Two times a day (BID) | ORAL | 0 refills | Status: AC

## 2021-12-17 MED ORDER — CEFTRIAXONE SODIUM 1 G IJ SOLR
1.0000 g | Freq: Once | INTRAMUSCULAR | Status: AC
  Administered 2021-12-17: 1 g via INTRAMUSCULAR

## 2021-12-17 NOTE — Telephone Encounter (Signed)
Patient called front office, call transferred to clinical staff. Pt states she has been having a menstrual period for past 7 days. Pt is concerned because this has also been accompanied by hot flashes. States her bleeding is usually red and heavy. This period has been dark brown spotting. Reports also having headaches and dizziness this period, but was told by her doctor that this was related to migraines. Pt is concerned that she is experiencing early menopause because this has happened to multiple women in her family. Appt scheduled with Kennon Rounds for 01/14/22, pt agreeable. Pt to follow up with PCP regarding migraines and to notify our office with any changes prior to appt.

## 2021-12-17 NOTE — Patient Instructions (Addendum)
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  It may never occur again but if it does or becomes a recurrent thing we can always discuss neurovestibular rehab.  If you develop any confusion, worsening symptoms, change in your headache/migraine presentation, or ongoing symptoms of the dizziness please return.  If this becomes a recurrent thing I would like for you to return to discuss other options.  For your pelvic pain and vaginal discharge I have some concern for an infection as the cause.  There is also the possibility that this is an ovarian cyst.  I think it is fine to take some pain medicine over-the-counter to help with the pain.  We took swabs today and I will let you know the results.  We also will check some blood work.  We will give an injection today for an antibiotic and I will send in 14 days worth of 2 other antibiotics for you to take.  I may call you on Monday until you can stop these antibiotics according to what your testing shows.

## 2021-12-17 NOTE — Telephone Encounter (Deleted)
Dizzy, hot flashes, migraine 1 week 11/06/21 7 days     hey I have this pt on hold, she is saying she is having very heavy bleeding and it is making her dizzy and giving her headaches, after hours told her she needed to be seen asap but I told her I would see if you could speak with her    Dark brown discharge, spotting

## 2021-12-17 NOTE — Progress Notes (Signed)
SUBJECTIVE:   CHIEF COMPLAINT / HPI:   Pelvic pain   vaginal discharge: She had a pelvic ultrasound done on 10/12/2020 with mild heterogeneity of the myometrium raising question of adenomyosis with no focal lesion.  She was recommended follow-up with her OB/GYN at that time. She did see OB/GYN on 12/03/2020 and was recommended an empiric therapy trial with GnRH antagonist Orilissa 200 mg twice daily but she discontinued this due to risk of blood clots.  She has a follow-up scheduled for 01/14/2022 with OB/GYN.She had a recent hemoglobin checked 10 days ago that was within normal limits.  She states she had a dark discharge with some occasional reddish color about 4 days ago after having immediate sharp pelvic pain. She states this is the time for her periods. She denies vaginal pain but has had some ongoing bouts of sharp left sided pelvic pain. This pain has been going on about 4 days when she started passing clots. She has also had some pelvic cramping normal for this time of the month for her. She was tested positive for chlamydia back in November.  She states she is unsure if her partner ever got treated or completed his treatment and states that they did have intercourse at least 1 time after this.  Dizziness: Has been 3 days of dizziness. She says she had dizziness in the past associated with migraines that improved. She feels like the room is spinning. She states she was unable to drive due to the symptoms. She had a headache occur after the dizziness.  PERTINENT  PMH / PSH: Heavy uterine bleeding  OBJECTIVE:   BP 108/86    Pulse (!) 108    Ht 5\' 6"  (1.676 m)    Wt 205 lb 6 oz (93.2 kg)    LMP 11/06/2021 Comment: of last real cycle   SpO2 100%    BMI 33.15 kg/m    General: NAD, pleasant, able to participate in exam Cardiac: RRR, no murmurs. Respiratory: CTAB, normal effort, No wheezes, rales or rhonchi Abdomen: Bowel sounds present, mild generalized discomfort to palpation of the  upper abdomen, she has sharp pain with palpation of the left pelvic region, when palpating the right pelvic region and this location of her right ovary she has sharp left-sided pain in the location of her left ovary. Pelvic exam: VULVA: normal appearing vulva with no masses, tenderness or lesions, VAGINA: vaginal discharge - dark and mucoid, ADNEXA: tenderness left.  Chaperone: Tishira Skin: warm and dry, no rashes noted Neuro: alert, no obvious focal deficits, CN II through XII intact, fine touch sensation intact, strength 5/5 in upper and lower extremities bilaterally Psych: Normal affect and mood  ASSESSMENT/PLAN:     Pelvic pain   vaginal discharge   concern for PID: Present for the past 4 days or so.  Was associated with normal period cramps but then developed exquisitely sharp left-sided pelvic pain.  Since then has had some dark discharge.  There was a question if this was dried blood or another form of discharge. She has had some lighter colored reddish discharge as well.  She did recently get tested positive for chlamydia and underwent treatment but is unsure if her partner did.  She endorses that she did have intercourse with the partner after she was treated.  Differential for her pelvic pain can include ruptured ovarian cyst versus PID.  I am somewhat suspicious for PID given the recent history.  Urine pregnancy test today negative.  When palpating  the abdomen in the region of the left ovary she has significant pain, when palpating the region of the right ovary she has pain on the left side.  Pelvic exam shows dark purulent appearing discharge with no red blood noted.  She has pain with palpation in the left ovary but not significant cervical motion tenderness.  Discussed case with Dr. Wendy Poet.  Determined it is reasonable to wait until Monday to see her results rather than treating empirically for PID.  Wet prep with 10-20 white blood cells per high-power field -We will presumably treat for  PID with ceftriaxone x1, metronidazole and doxycycline for 14 days -We will check a CBC -GC/chlamydia swabs pending  BPPV: Dizziness present for a few days that feels like the room is spinning.  She got a migraine headache after the first occurrence of this which has improved/resolved.  She has nonfocal neuro exam today.  Discussed etiology and treatment options for BPPV including referral for neurovestibular rehab should this become a recurrent problem.  Explained that this may become a recurrent problem versus only occurring on occasion versus never recurrent again.  Discussed return precautions in detail.  Lurline Del, [place]

## 2021-12-18 LAB — CBC
Hematocrit: 39.3 % (ref 34.0–46.6)
Hemoglobin: 13.6 g/dL (ref 11.1–15.9)
MCH: 30.5 pg (ref 26.6–33.0)
MCHC: 34.6 g/dL (ref 31.5–35.7)
MCV: 88 fL (ref 79–97)
Platelets: 289 10*3/uL (ref 150–450)
RBC: 4.46 x10E6/uL (ref 3.77–5.28)
RDW: 13.2 % (ref 11.7–15.4)
WBC: 9 10*3/uL (ref 3.4–10.8)

## 2021-12-20 ENCOUNTER — Telehealth: Payer: Self-pay | Admitting: Family Medicine

## 2021-12-20 LAB — CERVICOVAGINAL ANCILLARY ONLY
Chlamydia: NEGATIVE
Comment: NEGATIVE
Comment: NORMAL
Neisseria Gonorrhea: NEGATIVE

## 2021-12-20 NOTE — Telephone Encounter (Signed)
See phone note

## 2021-12-20 NOTE — Telephone Encounter (Signed)
Called patient to discuss her lab results.  Lab results were negative for GC/chlamydia but discussed with her we should continue with the treatment for PID based off her clinical symptoms.  Patient endorses that she continues to have bouts of dizziness and that her tachycardia which she was experiencing at our last visit remains in the low 100s.  We anticipated that this would improve after treatment for PID but it remains.  She states that her pelvic pain has improved since initiating antibiotics.  Discussed with her that her having ongoing bouts of vertigo as well as this tachycardia does warrant additional follow-up.  She is going to call our clinic today to try to get an appointment today tomorrow or with me on Wednesday.  I did discuss return/ED precautions.

## 2021-12-21 ENCOUNTER — Other Ambulatory Visit: Payer: Self-pay | Admitting: Family Medicine

## 2021-12-21 ENCOUNTER — Ambulatory Visit (INDEPENDENT_AMBULATORY_CARE_PROVIDER_SITE_OTHER): Payer: Medicaid Other

## 2021-12-21 ENCOUNTER — Encounter: Payer: Self-pay | Admitting: Family Medicine

## 2021-12-21 ENCOUNTER — Other Ambulatory Visit: Payer: Self-pay

## 2021-12-21 ENCOUNTER — Ambulatory Visit (INDEPENDENT_AMBULATORY_CARE_PROVIDER_SITE_OTHER): Payer: Medicaid Other | Admitting: Family Medicine

## 2021-12-21 VITALS — BP 115/75 | HR 76 | Ht 69.0 in | Wt 206.4 lb

## 2021-12-21 DIAGNOSIS — R42 Dizziness and giddiness: Secondary | ICD-10-CM | POA: Insufficient documentation

## 2021-12-21 DIAGNOSIS — R002 Palpitations: Secondary | ICD-10-CM

## 2021-12-21 DIAGNOSIS — G43009 Migraine without aura, not intractable, without status migrainosus: Secondary | ICD-10-CM | POA: Diagnosis not present

## 2021-12-21 NOTE — Assessment & Plan Note (Signed)
Currently asymptomatic. No neurologic deficit. Recent neuroimaging -MRI brain from November of 2022 was normal. Continue neurology follow up. Return soon if symptoms worsens.

## 2021-12-21 NOTE — Progress Notes (Addendum)
SUBJECTIVE:   CHIEF COMPLAINT / HPI:   Dizziness: C/O  intermittent dizziness ongoing x 6 days. Dizziness is worse with certain mornings and ambulation. She sometimes feels dizzy when she wakes in the morning. She denies vomiting but occasionally feels nauseous with her dizziness. No recent change in her vision, but whenever she feels dizzy, her right eye will feel jumpy. She was recently diagnosed with astigmatism. She denies head trauma, no chest pain, or SOB.  Palpitation: Palpitations started at the same time as her dizziness and occurred almost at the same time. She endorsed elevated HR during her last visit and at work recently. She denies chest pain and no SOB. She drinks alcohol and coffee occasionally. The only new medication she started was her Migraine meds in November 2022. Meghan Reed was d/ced in Oct of 2022. She also was on Nortriptyline, but she self d/ced it four months ago.  Headache: Currently on Meghan Reed with some improvement. Currently asymptomatic. She follow closely with her neurologist.  PERTINENT  PMH / [place]: PMHx reviewed.  OBJECTIVE:   BP 115/75    Pulse 76    Ht 5\' 9"  (1.753 m)    Wt 206 lb 6 oz (93.6 kg)    SpO2 100%    BMI 30.48 kg/m   Repeat HR 76 and 88  Orthostatic VS for the past 72 hrs (Last 3 readings):  Orthostatic BP Orthostatic Pulse  12/21/21 1106 (!) 68/40 90  12/21/21 1031 99/61 88  12/21/21 1030 99/67 79    Physical Exam Vitals and nursing note reviewed.  Neck:     Thyroid: No thyromegaly.  Cardiovascular:     Rate and Rhythm: Normal rate and regular rhythm.     Heart sounds: Normal heart sounds. No murmur heard.    Comments: Hearth rhythm regular with occasional skips Pulmonary:     Effort: Pulmonary effort is normal. No respiratory distress.     Breath sounds: Normal breath sounds. No wheezing.  Abdominal:     General: Abdomen is flat. Bowel sounds are normal.  Musculoskeletal:     Right lower leg: No edema.      Left lower leg: No edema.  Neurological:     General: No focal deficit present.     Cranial Nerves: Cranial nerves 2-12 are intact.     Motor: Motor function is intact.     Coordination: Coordination is intact.     Deep Tendon Reflexes: Reflexes are normal and symmetric.      ASSESSMENT/PLAN:   Dizziness Currently asymptomatic. No neurologic deficit on exam. Likely vertigo vs orthostatic hypotension. See orthostatic vitals above. She became symptomatic when her BP dropped to 68/40 on stating. Of note, some of the medications she is on may contribute to her symptoms Meghan Reed and [name]). She however, stated that she d/ced both meds a few months Orthostatic hypotension precaution discuss. May return soon or go to the ED if symptoms worsens.  Palpitations Her heart rate remained less than 100 through this visit. Hearth rhythm regular with occasional skips. EKG with NSR @ 78 bpm, no ischemic changes. Recent labs reviewed including CBC and bmet. TSH checked today. Holter monitoring ordered and referral placed to cards. She may return sooner or return to the ED if symptoms worsens. She agreed with the plan.   Migraine without aura and without status migrainosus, not intractable Currently asymptomatic. No neurologic deficit. Recent neuroimaging -MRI brain from November of 2022 was normal. Continue neurology follow up. Return soon if  symptoms worsens.    Meghan Mews, MD [place]

## 2021-12-21 NOTE — Assessment & Plan Note (Addendum)
Her heart rate remained less than 100 through this visit. Hearth rhythm regular with occasional skips. EKG with NSR @ 78 bpm, no ischemic changes. Recent labs reviewed including CBC and bmet. TSH checked today. Holter monitoring ordered and referral placed to cards. She may return sooner or return to the ED if symptoms worsens. She agreed with the plan.

## 2021-12-21 NOTE — Assessment & Plan Note (Addendum)
Currently asymptomatic. No neurologic deficit on exam. Likely vertigo vs orthostatic hypotension. See orthostatic vitals above. She became symptomatic when her BP dropped to 68/40 on stating. Of note, some of the medications she is on may contribute to her symptoms [name] and [name]). She however, stated that she d/ced both meds a few months Orthostatic hypotension precaution discuss. Good hydration encouraged. May return soon or go to the ED if symptoms worsens.

## 2021-12-21 NOTE — Progress Notes (Unsigned)
Enrolled for [employer] to mail a ZIO XT long term holter monitor to the patients address on file.  

## 2021-12-21 NOTE — Patient Instructions (Signed)
Hypotension As your heart beats, it forces blood through your body. This force is called blood pressure. If you have hypotension, you have low blood pressure.  When your blood pressure is too low, you may not get enough blood to your brain or other parts of your body. This may cause you to feel weak, light-headed, have a fast heartbeat, or even faint. Low blood pressure may be harmless, or it may cause serious problems. What are the causes? Blood loss. Not enough water in the body (dehydration). Heart problems. Hormone problems. Pregnancy. A very bad infection. Not having enough of certain nutrients. Very bad allergic reactions. Certain medicines. What increases the risk? Age. The risk increases as you get older. Conditions that affect the heart or the brain and spinal cord (central nervous system). What are the signs or symptoms? Feeling: Weak. Light-headed. Dizzy. Tired (fatigued). Blurred vision. Fast heartbeat. Fainting, in very bad cases. How is this treated? Changing your diet. This may involve drinking more water or including more salt (sodium) in your diet by eating high-salt foods. Taking medicines to raise your blood pressure. Changing how much you take (the dosage) of some of your medicines. Wearing compression stockings. These stockings help to prevent blood clots and reduce swelling in your legs. In some cases, you may need to go to the hospital to: Receive fluids through an IV tube. Receive donated blood through an IV tube (transfusion). Get treated for an infection or heart problems, if this applies. Be monitored while medicines that you are taking wear off. Follow these instructions at home: Eating and drinking  Drink enough fluids to keep your pee (urine) pale yellow. Eat a healthy diet. Follow instructions from your doctor about what you can eat or drink. A healthy diet includes: Fresh fruits and vegetables. Whole grains. Low-fat (lean) meats. Low-fat  dairy products. If told, include more salt in your diet. Do not add extra salt to your diet unless your doctor tells you to. Eat small meals often. Avoid standing up quickly after you eat. Medicines Take over-the-counter and prescription medicines only as told by your doctor. Follow instructions from your doctor about changing how much you take of your medicines, if this applies. Do not stop or change any of your medicines on your own. General instructions  Wear compression stockings as told by your doctor. Get up slowly from lying down or sitting. Avoid hot showers and a lot of heat as told by your doctor. Return to your normal activities when your doctor says that it is safe. Do not smoke or use any products that contain nicotine or tobacco. If you need help quitting, ask your doctor. Keep all follow-up visits. Contact a doctor if: You vomit. You have watery poop (diarrhea). You have a fever for more than 2-3 days. You feel more thirsty than normal. You feel weak and tired. Get help right away if: You have chest pain. You have a fast or uneven heartbeat. You lose feeling (have numbness) in any part of your body. You cannot move your arms or your legs. You have trouble talking. You get sweaty or feel light-headed. You faint. You have trouble breathing. You have trouble staying awake. You feel mixed up (confused). These symptoms may be an emergency. Get help right away. Call 911. Do not wait to see if the symptoms will go away. Do not drive yourself to the hospital. Summary Hypotension is also called low blood pressure. It is when the force of blood pumping through your body  is too weak. Hypotension may be harmless, or it may cause serious problems. Treatment may include changing your diet and medicines, and wearing compression stockings. In very bad cases, you may need to go to the hospital. This information is not intended to replace advice given to you by your health care  provider. Make sure you discuss any questions you have with your health care provider. Document Revised: 06/07/2021 Document Reviewed: 06/07/2021 Elsevier Patient Education  [place].

## 2021-12-22 LAB — TSH: TSH: 0.909 u[IU]/mL (ref 0.450–4.500)

## 2021-12-23 DIAGNOSIS — R002 Palpitations: Secondary | ICD-10-CM | POA: Diagnosis not present

## 2022-01-03 ENCOUNTER — Encounter: Payer: Self-pay | Admitting: Cardiovascular Disease

## 2022-01-03 ENCOUNTER — Other Ambulatory Visit: Payer: Self-pay

## 2022-01-03 ENCOUNTER — Ambulatory Visit (INDEPENDENT_AMBULATORY_CARE_PROVIDER_SITE_OTHER): Payer: Medicaid Other | Admitting: Cardiovascular Disease

## 2022-01-03 VITALS — BP 116/80 | HR 93 | Ht 66.5 in | Wt 207.4 lb

## 2022-01-03 DIAGNOSIS — R079 Chest pain, unspecified: Secondary | ICD-10-CM | POA: Insufficient documentation

## 2022-01-03 DIAGNOSIS — E785 Hyperlipidemia, unspecified: Secondary | ICD-10-CM

## 2022-01-03 MED ORDER — OMEPRAZOLE MAGNESIUM 20 MG PO TBEC: 20.0000 mg | DELAYED_RELEASE_TABLET | Freq: Every day | ORAL | Status: DC
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
## 2022-01-03 NOTE — Patient Instructions (Signed)
Medication Instructions:  ?Your physician has recommended you make the following change in your medication: ? ?1) START Omeprazole '20mg'$  once daily ? ?*If you need a refill on your cardiac medications before your next appointment, please call your pharmacy* ? ? ?Lab Work: ?NONE ?If you have labs (blood work) drawn today and your tests are completely normal, you will receive your results only by: ?MyChart Message (if you have MyChart) OR ?A paper copy in the mail ?If you have any lab test that is abnormal or we need to change your treatment, we will call you to review the results. ? ? ?Testing/Procedures: ?Your physician has requested that you have an exercise tolerance test. For further information please visit [facility]uge[facility]iesta[employer][email or web address]. Please also follow instruction sheet, as given. ? ?Follow-Up: ?At [facility], you and your health needs are our priority.  As part of our continuing mission to provide you with exceptional heart care, we have created designated Provider Care Teams.  These Care Teams include your primary Cardiologist (physician) and Advanced Practice Providers (APPs -  Physician Assistants and Nurse Practitioners) who all work together to provide you with the care you need, when you need it. ? ?Your next appointment:   ?3 month(s) ? ?The format for your next appointment:   ?In Person ? ?Provider:   ?Robbie Lis, PA-C, Christen Bame, NP, or Richardson Dopp, PA-C ?

## 2022-01-06 ENCOUNTER — Telehealth: Payer: Self-pay

## 2022-01-06 NOTE — Telephone Encounter (Signed)
Patient calls nurse line regarding results from holter monitor. Patient reports that she has been on the phone with cardiology this morning and that they told her that she needed to contact our office for these results.  ? ?Will forward to Dr. Gwendlyn Deutscher as she saw patient for the concern.  ? ?Talbot Grumbling, RN ? ?

## 2022-01-06 NOTE — Telephone Encounter (Signed)
I don't think I received the result in my inbox. However, I reviewed the report and it looks reassuring. This was discussed with the patient. ?She stated she still has intermittent palpitation and dizziness. ?She has stress test coming up with cardiology. I advised her to follow-up with cards as planned and return to [place] or to the ED if symptoms worsens. ?I also made a follow-up appointment with [place] tomorrow at 2:30 PM. She agreed with the follow up plan.  ?

## 2022-01-07 ENCOUNTER — Ambulatory Visit (INDEPENDENT_AMBULATORY_CARE_PROVIDER_SITE_OTHER): Payer: Medicaid Other | Admitting: Family Medicine

## 2022-01-07 ENCOUNTER — Other Ambulatory Visit: Payer: Self-pay

## 2022-01-07 VITALS — BP 108/78 | HR 92 | Wt 208.4 lb

## 2022-01-07 DIAGNOSIS — R42 Dizziness and giddiness: Secondary | ICD-10-CM

## 2022-01-07 DIAGNOSIS — N912 Amenorrhea, unspecified: Secondary | ICD-10-CM | POA: Diagnosis not present

## 2022-01-07 DIAGNOSIS — R002 Palpitations: Secondary | ICD-10-CM

## 2022-01-07 NOTE — Progress Notes (Signed)
? ? ?  SUBJECTIVE:  ? ?CHIEF COMPLAINT / HPI:  ? ?Dizziness, palpitations ?Following up from 2/21 visit. States she continues to have daily episodes of dizziness and feeling as though her heart is racing. Occurs when she wakes up and when she is at work. She endorses feeling light headed when rising from a seated position. She states she drinks a lot of water even at work she carries around her water with her. Has not received her results from her holter monitor but states she is scheduled for a stress test 3/14. She state her cardiologist does not believe her symptoms are from her heart. She feels off, her periods which were previously regular are now irregular. She has not had a period in 2 months. She denies being sexually active in those 2 months and has a history of a tubal ligation. She has follow up with her gynecologist 3/14 as well. She denies vision changes, headaches, chest pain, and shortness of breath.  ? ?PERTINENT  PMH / PSH: Hx of migraines, cervical radiculopathy (has Neurology follow up in June or July of this year)  ? ?OBJECTIVE:  ? ?BP 108/78   Pulse 92   Wt 208 lb 6.4 oz (94.5 kg)   SpO2 100%   BMI 33.13 kg/m?   ?Orthostatic VS for the past 24 hrs: ? BP- Lying Pulse- Lying BP- Sitting Pulse- Sitting BP- Standing at 0 minutes Pulse- Standing at 0 minutes  ?01/07/22 1444 114/79 80 119/82 87 108/50 102  ? ? ? ?General: Appears well, no acute distress. Age appropriate. ?Cardiac: RRR, normal heart sounds, no murmurs ?Respiratory: CTAB, normal effort ?Neuro:  ?CN II: PERRL ?CN III, IV,VI: EOMI ?CV V: Normal sensation in V1, V2, V3 ?CVII: Symmetric smile and brow raise ?CN VIII: Normal hearing ?CN IX,X: Symmetric palate raise  ?CN XI: 5/5 shoulder shrug ?CN XII: Symmetric tongue protrusion  ?2+ UE and LE reflexes  ?Normal sensation in UE and LE bilaterally  ?Normal gait ?Psych: normal affect ? ? ?ASSESSMENT/PLAN:  ?1. Dizziness ?Accompanied by palpitations, ongoing for 3 weeks upon waking up,  standing from a seated position, and while walking around at work. She was previously referred to cardiology and holter monitor results reviewed today: PVCs, and PACs mostly in sinus rhythm with sinus tach and sinus brady. Stress test pending for 3/14. Today she has positive orthostatic vitals. I have reviewed all previous lab work such as CBC, BMP, TSH, urine pregnancy that are all unremarkable. No additional labs needed at this time. She is well appearing with normal neuro exam. Medication list reviewed. She is not on any antihypertensives. Discussed continued hydration with mixing in gatorade. Will defer to cardiology to consider echo. Beyond continue cardiac work up, can consider liberating salt in diet. Also consider other neurological causes. Will likely require multiple visits and multidisciplinary approach. Encouraged to follow up with me in 1 month. Will review stress test and gyn visit when available.  ? ?2. Palpitations ?See above plan.  ? ?3. Amenorrhea ?2 months. Previously regular. Hx of tubal. Reviewed recent previous negative pregnancy tests. Has F/U with GYN 3/14. Consider labs for secondary amenorrhea in 1 month.  ? ?Gerlene Fee, DO ?[place]  ?

## 2022-01-07 NOTE — Patient Instructions (Addendum)
It was wonderful to see you today. ? ?Please bring ALL of your medications with you to every visit.  ? ?Today we talked about: ? ?Mixing up your oral intake with gatorade. Rise from a seat position slowly.  ?Continue to follow up with cardiology and neurology.  ?You can follow up with me or gyn for the missed period depending on your appointment with them next week.  ? ? ?Please be sure to schedule follow up at the front  desk before you leave today.  ? ?If you haven't already, sign up for My Chart to have easy access to your labs results, and communication with your primary care physician. ? ?Please call the clinic at [phone] if your symptoms worsen or you have any concerns. It was our pleasure to serve you. ? ?Dr. Janus Molder ? ?

## 2022-01-07 NOTE — Progress Notes (Deleted)
? ? ?  SUBJECTIVE:  ? ?CHIEF COMPLAINT / HPI:  ? ?*** ? ?PERTINENT  PMH / PSH: *** ? ?OBJECTIVE:  ? ?BP 108/78   Pulse 92   Wt 208 lb 6.4 oz (94.5 kg)   SpO2 100%   BMI 33.13 kg/m?   ?*** ? ?ASSESSMENT/PLAN:  ? ?No problem-specific Assessment & Plan notes found for this encounter. ?  ? ? ?[place] Autry-Lott, DO ?[place]  ?

## 2022-01-11 ENCOUNTER — Ambulatory Visit (INDEPENDENT_AMBULATORY_CARE_PROVIDER_SITE_OTHER): Payer: Medicaid Other | Admitting: Obstetrics and Gynecology

## 2022-01-11 ENCOUNTER — Encounter: Payer: Self-pay | Admitting: Obstetrics and Gynecology

## 2022-01-11 ENCOUNTER — Ambulatory Visit (INDEPENDENT_AMBULATORY_CARE_PROVIDER_SITE_OTHER): Payer: Medicaid Other

## 2022-01-11 ENCOUNTER — Ambulatory Visit: Payer: Medicaid Other | Admitting: Family Medicine

## 2022-01-11 ENCOUNTER — Other Ambulatory Visit: Payer: Self-pay

## 2022-01-11 VITALS — BP 115/77 | HR 89 | Wt 207.0 lb

## 2022-01-11 DIAGNOSIS — N939 Abnormal uterine and vaginal bleeding, unspecified: Secondary | ICD-10-CM

## 2022-01-11 DIAGNOSIS — N951 Menopausal and female climacteric states: Secondary | ICD-10-CM | POA: Diagnosis not present

## 2022-01-11 DIAGNOSIS — R079 Chest pain, unspecified: Secondary | ICD-10-CM | POA: Diagnosis not present

## 2022-01-11 DIAGNOSIS — N920 Excessive and frequent menstruation with regular cycle: Secondary | ICD-10-CM

## 2022-01-11 DIAGNOSIS — E785 Hyperlipidemia, unspecified: Secondary | ICD-10-CM

## 2022-01-11 DIAGNOSIS — N946 Dysmenorrhea, unspecified: Secondary | ICD-10-CM

## 2022-01-11 LAB — EXERCISE TOLERANCE TEST
Angina Index: 0
Duke Treadmill Score: 8
Estimated workload: 10.1
Exercise duration (min): 8 min
Exercise duration (sec): 0 s
MPHR: 184 {beats}/min
Peak HR: 162 {beats}/min
Percent HR: 88 %
RPE: 16
Rest HR: 75 {beats}/min
ST Depression (mm): 0 mm

## 2022-01-11 LAB — POCT PREGNANCY, URINE: Preg Test, Ur: NEGATIVE
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
## 2022-01-12 ENCOUNTER — Encounter: Payer: Self-pay | Admitting: Obstetrics and Gynecology

## 2022-01-12 LAB — FOLLICLE STIMULATING HORMONE: FSH: 3.5 m[IU]/mL

## 2022-01-12 LAB — PROLACTIN: Prolactin: 22.6 ng/mL (ref 4.8–23.3)

## 2022-01-12 LAB — ESTRADIOL: Estradiol: 43.4 pg/mL

## 2022-01-12 LAB — BETA HCG QUANT (REF LAB): hCG Quant: <1 m[IU]/mL

## 2022-01-17 ENCOUNTER — Encounter: Payer: Self-pay | Admitting: Obstetrics and Gynecology

## 2022-01-18 ENCOUNTER — Encounter: Payer: Self-pay | Admitting: Emergency Medicine

## 2022-01-18 ENCOUNTER — Other Ambulatory Visit: Payer: Self-pay | Admitting: Lactation Services

## 2022-01-18 ENCOUNTER — Ambulatory Visit
Admission: EM | Admit: 2022-01-18 | Discharge: 2022-01-18 | Disposition: A | Discharge status: Home / Self Care | Payer: Medicaid Other | Attending: Internal Medicine | Admitting: Internal Medicine

## 2022-01-18 ENCOUNTER — Other Ambulatory Visit: Payer: Self-pay

## 2022-01-18 DIAGNOSIS — J029 Acute pharyngitis, unspecified: Secondary | ICD-10-CM | POA: Diagnosis present

## 2022-01-18 DIAGNOSIS — J018 Other acute sinusitis: Secondary | ICD-10-CM | POA: Diagnosis not present

## 2022-01-18 DIAGNOSIS — G43009 Migraine without aura, not intractable, without status migrainosus: Secondary | ICD-10-CM | POA: Diagnosis not present

## 2022-01-18 LAB — POCT RAPID STREP A (OFFICE): Rapid Strep A Screen: NEGATIVE

## 2022-01-18 MED ORDER — FLUCONAZOLE 150 MG PO TABS: 150.0000 mg | ORAL_TABLET | Freq: Once | ORAL | 0 refills | Status: AC

## 2022-01-18 MED ORDER — DEXAMETHASONE SODIUM PHOSPHATE 10 MG/ML IJ SOLN
10.0000 mg | Freq: Once | INTRAMUSCULAR | Status: AC
  Administered 2022-01-18: 10 mg via INTRAMUSCULAR

## 2022-01-18 MED ORDER — AMOXICILLIN-POT CLAVULANATE 875-125 MG PO TABS: 1.0000 | ORAL_TABLET | Freq: Two times a day (BID) | ORAL | 0 refills | Status: DC

## 2022-01-18 MED ORDER — KETOROLAC TROMETHAMINE 30 MG/ML IJ SOLN
30.0000 mg | Freq: Once | INTRAMUSCULAR | Status: AC
  Administered 2022-01-18: 30 mg via INTRAMUSCULAR

## 2022-01-18 NOTE — Progress Notes (Signed)
Diflucan ordered for thick white vaginal discharge with itching per standing order. Patient informed via [place].  ?
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
## 2022-01-18 NOTE — ED Triage Notes (Signed)
Pt sts HA x 3 days that is similar to migraine with hx of same; pt sts some sores on her tongue ?

## 2022-01-18 NOTE — Discharge Instructions (Addendum)
It appears that you may have a migraine and/or sinus infection causing your symptoms.  Rapid strep was negative.  Throat culture is pending.  You have prescribed an antibiotic to help alleviate symptoms.  Follow-up at the hospital if symptoms persist or worsen.  Diflucan has been sent in by another provider. ?

## 2022-01-21 ENCOUNTER — Other Ambulatory Visit: Payer: Self-pay

## 2022-01-21 ENCOUNTER — Ambulatory Visit (HOSPITAL_COMMUNITY)
Admission: RE | Admit: 2022-01-21 | Discharge: 2022-01-21 | Disposition: A | Discharge status: Home / Self Care | Payer: Medicaid Other | Source: Ambulatory Visit | Attending: Obstetrics and Gynecology | Admitting: Obstetrics and Gynecology

## 2022-01-21 DIAGNOSIS — N951 Menopausal and female climacteric states: Secondary | ICD-10-CM | POA: Diagnosis present

## 2022-01-21 DIAGNOSIS — N939 Abnormal uterine and vaginal bleeding, unspecified: Secondary | ICD-10-CM | POA: Diagnosis present

## 2022-01-21 LAB — CULTURE, GROUP A STREP (THRC)

## 2022-01-24 ENCOUNTER — Encounter: Payer: Self-pay | Admitting: Obstetrics and Gynecology

## 2022-01-31 ENCOUNTER — Ambulatory Visit
Admission: RE | Admit: 2022-01-31 | Discharge: 2022-01-31 | Disposition: A | Discharge status: Home / Self Care | Payer: Medicaid Other | Source: Ambulatory Visit | Attending: Pain Medicine | Admitting: Pain Medicine

## 2022-01-31 ENCOUNTER — Other Ambulatory Visit: Payer: Self-pay | Admitting: Pain Medicine

## 2022-01-31 DIAGNOSIS — R52 Pain, unspecified: Secondary | ICD-10-CM

## 2022-02-01 ENCOUNTER — Telehealth: Payer: Self-pay | Admitting: Family Medicine

## 2022-02-01 NOTE — Telephone Encounter (Signed)
Called patient to schedule appointment with Dr. Ilda Basset, there was no answer to the phone call so a voicemail was left with the call back number for the office and a letter was mailed.  ?

## 2022-02-01 NOTE — Telephone Encounter (Signed)
Patient called in stating she does not want to keep the appointment scheduled for her because she is seeking a second opinion. She stated she will call us back if she would like to re-schedule after her appointment with her PCP.  ?

## 2022-02-11 ENCOUNTER — Ambulatory Visit: Payer: Medicaid Other | Admitting: Obstetrics and Gynecology

## 2022-02-11 ENCOUNTER — Encounter: Payer: Self-pay | Admitting: Family Medicine

## 2022-02-11 ENCOUNTER — Ambulatory Visit (INDEPENDENT_AMBULATORY_CARE_PROVIDER_SITE_OTHER): Payer: Medicaid Other | Admitting: Family Medicine

## 2022-02-11 VITALS — BP 100/80 | HR 105 | Ht 66.5 in | Wt 211.2 lb

## 2022-02-11 DIAGNOSIS — N9489 Other specified conditions associated with female genital organs and menstrual cycle: Secondary | ICD-10-CM | POA: Diagnosis present

## 2022-02-11 DIAGNOSIS — R102 Pelvic and perineal pain: Secondary | ICD-10-CM

## 2022-02-11 MED ORDER — DULOXETINE HCL 30 MG PO CPEP: 30.0000 mg | ORAL_CAPSULE | Freq: Every day | ORAL | 0 refills | Status: DC

## 2022-02-11 NOTE — Progress Notes (Signed)
? ? ?  SUBJECTIVE:  ? ?CHIEF COMPLAINT / HPI:  ? ?Discuss [place] results, follow up pelvic pain ?States that she is upset with the results of her pelvic ultrasound. She was told her ultrasound was normal but states the findings were not there on her previous ultrasound and she wants a second opinion and to possibly discuss a hysterectomy. She continues to have daily pelvic pain that is making it harder for her to do her job and be a mother. She also endorses worsening depression because of the pain. She denies vaginal bleeding, dysuria, vaginal odor, abnormal discharge.  ? ?PERTINENT  PMH / PSH: Hx of piriformis syndrome, hyper mobility syndrome, BTL, PE ? ?OBJECTIVE:  ? ?BP 100/80   Pulse (!) 105   Ht 5' 6.5" (1.689 m)   Wt 211 lb 3.2 oz (95.8 kg)   LMP 11/06/2021 (Exact Date)   SpO2 98%   BMI 33.58 kg/m?   ?Physical Exam ?Vitals reviewed.  ?Constitutional:   ?   General: She is not in acute distress. ?   Appearance: She is not ill-appearing, toxic-appearing or diaphoretic.  ?Cardiovascular:  ?   Rate and Rhythm: Normal rate and regular rhythm.  ?   Heart sounds: Normal heart sounds.  ?Pulmonary:  ?   Effort: Pulmonary effort is normal.  ?   Breath sounds: Normal breath sounds.  ?Abdominal:  ?   General: There is no distension.  ?   Palpations: Abdomen is soft. There is no mass.  ?   Tenderness: There is no guarding.  ?Neurological:  ?   Mental Status: She is alert and oriented to person, place, and time.  ?Psychiatric:     ?   Mood and Affect: Mood normal.     ?   Behavior: Behavior normal.  ?   Comments: Tearful when discussing pelvic pain and ultrasound results.  ? ?ASSESSMENT/PLAN:  ? 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 Occasional lateralization to the left side. Reviewed [place] from 3/24 and remarkable for pelvic congestion syndrome which appears to have been present on a 2010 [place]. Lower suspicion for torsion or ectopic pregnancy at this time. Recent infection screenings negative. Recent pregnancy  tests negative. Non-acute abdomen. Patient would like second opinion and to discuss hysterectomy. Hx of BTL. Today I will attempt to treat the pain symptom and refer to and OBGYN. We will plan to follow up in 2 weeks to check in on improvement of pelvic pain.  ?- DULoxetine (CYMBALTA) 30 MG capsule; Take 1 capsule (30 mg total) by mouth daily.  Dispense: 30 capsule; Refill: 0 ?- Ambulatory referral to Gynecology ? ? ?Dalvin Clipper Autry-Lott, DO ?[place]  ?

## 2022-02-11 NOTE — Patient Instructions (Signed)
It was wonderful to see you today. ? ?Please bring ALL of your medications with you to every visit.  ? ?Today we talked about: ? ?Starting cymbalta for pelvic pain. I will work on a referral to another OBGYN office to discuss possible hysterectomy. Follow up with me in 2 weeks.  ? ?Please be sure to schedule follow up at the front  desk before you leave today.  ? ?If you haven't already, sign up for My Chart to have easy access to your labs results, and communication with your primary care physician. ? ?Please call the clinic at [phone] if your symptoms worsen or you have any concerns. It was our pleasure to serve you. ? ?Dr. Janus Molder ? ?

## 2022-02-25 ENCOUNTER — Ambulatory Visit (INDEPENDENT_AMBULATORY_CARE_PROVIDER_SITE_OTHER): Payer: Medicaid Other | Admitting: Family Medicine

## 2022-02-25 VITALS — BP 115/80 | HR 74 | Ht 66.5 in | Wt 210.8 lb

## 2022-02-25 DIAGNOSIS — N9489 Other specified conditions associated with female genital organs and menstrual cycle: Secondary | ICD-10-CM | POA: Diagnosis present

## 2022-02-25 MED ORDER — OMEPRAZOLE MAGNESIUM 20 MG PO TBEC: 20.0000 mg | DELAYED_RELEASE_TABLET | Freq: Every day | ORAL | 3 refills | Status: DC

## 2022-02-25 NOTE — Patient Instructions (Addendum)
It was wonderful to see you today. ? ?Please bring ALL of your medications with you to every visit.  ? ?Today we talked about: ? ?Starting omeprazole.  Taking Cymbalta with dinner or shortly after dinner at night.  You can increase your Cymbalta to 30 mg twice daily next week if the nausea has improved.  We will follow-up in 2 weeks to check for pelvic pain and improvement in mood. ? ?You can call [facility] [phone] to attempt to schedule your OB/GYN visit. ? ?Please be sure to schedule follow up at the front  desk before you leave today.  ? ?If you haven't already, sign up for My Chart to have easy access to your labs results, and communication with your primary care physician. ? ?Please call the clinic at [phone] if your symptoms worsen or you have any concerns. It was our pleasure to serve you. ? ?Dr. Janus Molder ? ?

## 2022-02-25 NOTE — Progress Notes (Signed)
? ? ?  SUBJECTIVE:  ? ?CHIEF COMPLAINT / HPI:  ? ?Pelvic pain, follow up ?Recent normal menstrual cycle. Relief with pelvic pain during this time. Continues to have some pain at baseline but is less painful than before with use of cymbalta. Has also improved her mood. She states her fiancee is very happy with this. Currently having some nausea with cymbalta. Is not taking her PPI. She was told it was prescribed by GI office but she has not received a call from the pharmacy.  ? ?PERTINENT  PMH / PSH: Anxiety ? ?OBJECTIVE:  ? ?BP 115/80   Pulse 74   Ht 5' 6.5" (1.689 m)   Wt 210 lb 12.8 oz (95.6 kg)   SpO2 96%   BMI 33.51 kg/m?   ? ?  02/25/2022  ?  3:58 PM 02/11/2022  ?  3:11 PM 01/11/2022  ?  3:06 PM  ?PHQ9 SCORE ONLY  ?PHQ-9 Total Score 4 4 0  ? ?General: Appears well, no acute distress. Age appropriate. ?Respiratory: normal effort ?Psych: normal affect, jovial mood ? ?ASSESSMENT/PLAN:  ? ?Pelvic congestion syndrome ?Some improvement with cymbalta continues to have noticeable pelvic pain. Recent cycle with improved pain as well. Hx of anxiety and feels she is in a better mood with cymbalta. Will increased today to 30 mg twice daily. Take with food and will send in omeprazole today (it was suggest to pick up OTC at GI office). Follow up in 2 weeks.  ? ?Gerlene Fee, DO ?[place]  ?

## 2022-02-28 ENCOUNTER — Other Ambulatory Visit (HOSPITAL_COMMUNITY): Payer: Self-pay

## 2022-02-28 ENCOUNTER — Encounter: Payer: Self-pay | Admitting: Hematology and Oncology

## 2022-03-01 DIAGNOSIS — N9489 Other specified conditions associated with female genital organs and menstrual cycle: Secondary | ICD-10-CM | POA: Insufficient documentation

## 2022-03-11 ENCOUNTER — Ambulatory Visit (INDEPENDENT_AMBULATORY_CARE_PROVIDER_SITE_OTHER): Payer: Medicaid Other | Admitting: Family Medicine

## 2022-03-11 VITALS — BP 115/75 | HR 88 | Wt 214.8 lb

## 2022-03-11 DIAGNOSIS — N921 Excessive and frequent menstruation with irregular cycle: Secondary | ICD-10-CM

## 2022-03-11 DIAGNOSIS — F419 Anxiety disorder, unspecified: Secondary | ICD-10-CM

## 2022-03-11 DIAGNOSIS — N9489 Other specified conditions associated with female genital organs and menstrual cycle: Secondary | ICD-10-CM | POA: Diagnosis present

## 2022-03-11 NOTE — Patient Instructions (Signed)
It was wonderful to see you today. ? ?Today we talked about: ? ?-Continuing cymbalta 30 mg daily ?- No new ultrasound needed at this time.  ?-I will call [facility] Monday and attempt to get you an appointment. I will call you on Monday as well.  ? ? ?Please call the clinic at [phone] if your symptoms worsen or you have any concerns. It was our pleasure to serve you. ? ?Dr. Janus Molder ? ? ? ?

## 2022-03-11 NOTE — Progress Notes (Signed)
? ? ?  SUBJECTIVE:  ? ?CHIEF COMPLAINT / HPI:  ? ?Follow up ?Here today to discuss nausea, pelvic pain, and mood. We previously discussed increasing cymbalta for improved pelvic pain but she states this made her very sleepy at work in which she has decided to just take 30 mg instead of 60. Her mood has improved significantly. After 2 weeks of menstruation she is having another cycle. She attempted to call the GYN office today and a few times this week and has been unable to get through.  ? ?PERTINENT  PMH / PSH: Pelvic congestion syndrome, depressed mood ? ?OBJECTIVE:  ? ?BP 115/75   Pulse 88   Wt 214 lb 12.8 oz (97.4 kg)   SpO2 96%   BMI 34.15 kg/m?  ?General: Appears well, no acute distress. Age appropriate. ?Respiratory: normal effort ?Neuro: alert and oriented, no focal deficits ?Psych: normal affect ? ?ASSESSMENT/PLAN:  ? ?1. Pelvic congestion syndrome  Menorrhagia with irregular cycle ?Continue cymbalta 30 mg. Will attempt to call office Monday to schedule an appt.  ? ?2. Anxious mood ?Continue cymbalta as above.  ? ?Gerlene Fee, DO ?[place]  ?

## 2022-03-17 ENCOUNTER — Encounter: Payer: Self-pay | Admitting: Internal Medicine

## 2022-03-17 ENCOUNTER — Ambulatory Visit (INDEPENDENT_AMBULATORY_CARE_PROVIDER_SITE_OTHER): Payer: Medicaid Other | Admitting: Internal Medicine

## 2022-03-17 ENCOUNTER — Telehealth: Payer: Self-pay | Admitting: Family Medicine

## 2022-03-17 VITALS — BP 118/76 | HR 115 | Temp 98.2°F | Resp 16 | Ht 66.5 in

## 2022-03-17 DIAGNOSIS — M5106 Intervertebral disc disorders with myelopathy, lumbar region: Secondary | ICD-10-CM

## 2022-03-17 DIAGNOSIS — M5442 Lumbago with sciatica, left side: Secondary | ICD-10-CM | POA: Insufficient documentation

## 2022-03-17 DIAGNOSIS — M5126 Other intervertebral disc displacement, lumbar region: Secondary | ICD-10-CM

## 2022-03-17 HISTORY — DX: Other intervertebral disc displacement, lumbar region: M51.26

## 2022-03-17 MED ORDER — METHYLPREDNISOLONE ACETATE 80 MG/ML IJ SUSP
120.0000 mg | Freq: Once | INTRAMUSCULAR | Status: AC
  Administered 2022-03-17: 120 mg via INTRAMUSCULAR

## 2022-03-17 MED ORDER — HYDROCODONE-ACETAMINOPHEN 5-325 MG PO TABS: 1.0000 | ORAL_TABLET | Freq: Four times a day (QID) | ORAL | 0 refills | Status: AC | PRN

## 2022-03-17 NOTE — Progress Notes (Signed)
Subjective:  Patient ID: Meghan Reed, female    DOB: [birth year]  Age: 36 y.o. MRN: [id number]  CC: Back Pain   HPI Meghan Reed presents for a 2-day history of nontraumatic left lower back pain that radiates into her left leg with tingling in her left foot and diffuse lower extremity weakness.  She has not gotten much symptom relief with Advil and a muscle relaxer.  Outpatient Medications Prior to Visit  Medication Sig Dispense Refill   albuterol (PROVENTIL) (2.5 MG/3ML) 0.083% nebulizer solution Take 3 mLs (2.5 mg total) by nebulization every 6 (six) hours as needed for wheezing or shortness of breath. 150 mL 1   albuterol (VENTOLIN HFA) 108 (90 Base) MCG/ACT inhaler Inhale 2 puffs into the lungs every 4 (four) hours as needed for wheezing or shortness of breath. 1 each 0   cyclobenzaprine (FLEXERIL) 10 MG tablet Take 1 tablet (10 mg total) by mouth at bedtime as needed for muscle spasms. 60 tablet 0   DULoxetine (CYMBALTA) 30 MG capsule Take 1 capsule (30 mg total) by mouth daily. 30 capsule 0   ferrous sulfate 325 (65 FE) MG tablet Take 1 tablet (325 mg total) by mouth daily with breakfast. 30 tablet 3   fluticasone (FLOVENT HFA) 110 MCG/ACT inhaler Inhale 2 puffs into the lungs 2 (two) times daily. 1 each 5   Galcanezumab-gnlm (EMGALITY) 120 MG/ML SOAJ Inject 120 mg into the skin every 30 (thirty) days. 1.12 mL 11   omeprazole (PRILOSEC OTC) 20 MG tablet Take 1 tablet (20 mg total) by mouth daily. 30 tablet 3   ondansetron (ZOFRAN-ODT) 8 MG disintegrating tablet Take 0.5-1 tablets (4-8 mg total) by mouth every 8 (eight) hours as needed. 30 tablet 6   Ubrogepant (UBRELVY) 100 MG TABS Take 1 tablet by mouth daily as needed. 8 tablet 11   amoxicillin-clavulanate (AUGMENTIN) 875-125 MG tablet Take 1 tablet by mouth every 12 (twelve) hours. 14 tablet 0   chlorpheniramine-HYDROcodone (TUSSIONEX PENNKINETIC ER) 10-8 MG/5ML SUER Take 5 mLs by mouth every 12  (twelve) hours as needed for cough. 115 mL 0   Elagolix Sodium (ORILISSA) 200 MG TABS Take 1 tablet by mouth in the morning and at bedtime. 60 tablet 6   nortriptyline (PAMELOR) 25 MG capsule Take 1 capsule (25 mg total) by mouth at bedtime. 30 capsule 3   No facility-administered medications prior to visit.    ROS Review of Systems  Constitutional: Negative.  Negative for fatigue and fever.  HENT: Negative.    Eyes: Negative.   Respiratory: Negative.    Cardiovascular: Negative.   Gastrointestinal: Negative.  Negative for abdominal pain.  Endocrine: Negative.   Genitourinary: Negative.   Musculoskeletal:  Positive for back pain. Negative for arthralgias and myalgias.  Skin: Negative.   Neurological:  Positive for weakness. Negative for numbness.  Hematological:  Negative for adenopathy. Does not bruise/bleed easily.  Psychiatric/Behavioral: Negative.     Objective:  BP 118/76 (BP Location: Right Arm, Patient Position: Sitting, Cuff Size: Large)   Pulse (!) 115   Temp 98.2 F (36.8 C) (Oral)   Resp 16   Ht 5' 6.5" (1.689 m)   LMP 03/13/2022 (Exact Date)   SpO2 99%   BMI 34.15 kg/m   BP Readings from Last 3 Encounters:  03/17/22 118/76  03/11/22 115/75  02/25/22 115/80    Wt Readings from Last 3 Encounters:  03/11/22 214 lb 12.8 oz (97.4 kg)  02/25/22 210 lb 12.8 oz (95.6 kg)  02/11/22 211 lb 3.2 oz (95.8 kg)    Physical Exam Vitals reviewed.  HENT:     Nose: Nose normal.     Mouth/Throat:     Mouth: Mucous membranes are moist.  Eyes:     General: No scleral icterus.    Conjunctiva/sclera: Conjunctivae normal.  Cardiovascular:     Rate and Rhythm: Regular rhythm. Tachycardia present.     Heart sounds: No murmur heard. Pulmonary:     Effort: Pulmonary effort is normal.     Breath sounds: No stridor. No wheezing, rhonchi or rales.  Abdominal:     General: Abdomen is flat.     Palpations: There is no mass.     Tenderness: There is no abdominal tenderness.  There is no guarding.     Hernia: No hernia is present.  Musculoskeletal:     Cervical back: Neck supple.     Thoracic back: Normal.     Lumbar back: No edema, signs of trauma, spasms, tenderness or bony tenderness. Normal range of motion. Positive left straight leg raise test. Negative right straight leg raise test.  Lymphadenopathy:     Cervical: No cervical adenopathy.  Neurological:     Cranial Nerves: Cranial nerves 2-12 are intact.     Motor: Weakness present.     Coordination: Coordination is intact.     Deep Tendon Reflexes: Reflexes abnormal.     Reflex Scores:      Tricep reflexes are 1+ on the left side.      Bicep reflexes are 1+ on the right side and 1+ on the left side.      Brachioradialis reflexes are 1+ on the right side and 1+ on the left side.      Patellar reflexes are 1+ on the right side and 1+ on the left side.      Achilles reflexes are 1+ on the right side and 0 on the left side.    Comments: Weakness in left foot and left lower leg.    Lab Results  Component Value Date   WBC 9.0 12/17/2021   HGB 13.6 12/17/2021   HCT 39.3 12/17/2021   PLT 289 12/17/2021   GLUCOSE 90 12/06/2021   CHOL 234 (H) 08/16/2021   TRIG 78 08/16/2021   HDL 55 08/16/2021   LDLCALC 165 (H) 08/16/2021   ALT 18 05/31/2021   AST 17 05/31/2021   NA 139 12/06/2021   K 3.9 12/06/2021   CL 103 12/06/2021   CREATININE 0.68 12/06/2021   BUN 10 12/06/2021   CO2 27 12/06/2021   TSH 0.909 12/21/2021   INR 1.5 (H) 10/31/2019   HGBA1C 5.2 08/12/2019    DG Lumbar Spine Complete  Result Date: 02/01/2022 CLINICAL DATA:  Midline back pain radiating into the tailbone and lower extremities EXAM: SACRUM AND COCCYX - 2+ VIEW; LUMBAR SPINE - COMPLETE 4+ VIEW COMPARISON:  AP Lat lumbar spine films 06/30/2020, CT abdomen and pelvis and reconstructions 05/31/2021 FINDINGS: There is slight lumbar dextroscoliosis without evidence of fractures or listhesis. The bone mineralization is normal. There is  preservation of the normal vertebral body and disc heights all lumbar levels. No spondylosis is seen. There is slight facet spurring at the lowest 3 levels without significant foraminal narrowing. The SI joints are unremarkable, as visualized. No fracture or focal bone lesions are seen views of the sacrum and coccyx. The first coccygeal segment is slightly posteriorly subluxed but this was also seen on the prior studies and is probably due  to remote trauma. There are bilateral BTL clips in the upper true pelvis. IMPRESSION: No acute radiographic findings or interval changes. There is a slight dextroscoliosis and otherwise normal alignment. Early facet spurring at the lowest 3 levels without other significant degenerative features. There is evidence of old coccygeal trauma. Electronically Signed   By: Telford Nab M.D.   On: 02/01/2022 01:57   DG Sacrum/Coccyx  Result Date: 02/01/2022 CLINICAL DATA:  Midline back pain radiating into the tailbone and lower extremities EXAM: SACRUM AND COCCYX - 2+ VIEW; LUMBAR SPINE - COMPLETE 4+ VIEW COMPARISON:  AP Lat lumbar spine films 06/30/2020, CT abdomen and pelvis and reconstructions 05/31/2021 FINDINGS: There is slight lumbar dextroscoliosis without evidence of fractures or listhesis. The bone mineralization is normal. There is preservation of the normal vertebral body and disc heights all lumbar levels. No spondylosis is seen. There is slight facet spurring at the lowest 3 levels without significant foraminal narrowing. The SI joints are unremarkable, as visualized. No fracture or focal bone lesions are seen views of the sacrum and coccyx. The first coccygeal segment is slightly posteriorly subluxed but this was also seen on the prior studies and is probably due to remote trauma. There are bilateral BTL clips in the upper true pelvis. IMPRESSION: No acute radiographic findings or interval changes. There is a slight dextroscoliosis and otherwise normal alignment. Early  facet spurring at the lowest 3 levels without other significant degenerative features. There is evidence of old coccygeal trauma. Electronically Signed   By: Telford Nab M.D.   On: 02/01/2022 01:57    Assessment & Plan:   Linsi was seen today for back pain.  Diagnoses and all orders for this visit:  Lumbar disc herniation with myelopathy- Will treat with an injection of methylprednisolone and will control the pain with hydrocodone and acetaminophen.  She has an abnormal neurologic exam so I recommended a stat MRI of the lumbar spine to screen for mass, spinal stenosis, disc herniation, nerve impingement, and other maladies that would be amenable to surgical intervention. -     HYDROcodone-acetaminophen (NORCO/VICODIN) 5-325 MG tablet; Take 1 tablet by mouth every 6 (six) hours as needed for up to 5 days for moderate pain. -     methylPREDNISolone acetate (DEPO-MEDROL) injection 120 mg -     MR Lumbar Spine Wo Contrast; Future  Acute left-sided low back pain with left-sided sciatica- See above. -     HYDROcodone-acetaminophen (NORCO/VICODIN) 5-325 MG tablet; Take 1 tablet by mouth every 6 (six) hours as needed for up to 5 days for moderate pain. -     methylPREDNISolone acetate (DEPO-MEDROL) injection 120 mg -     MR Lumbar Spine Wo Contrast; Future   I have discontinued Bubba Hales. Reed's Orilissa, nortriptyline, chlorpheniramine-HYDROcodone, and amoxicillin-clavulanate. I am also having her start on HYDROcodone-acetaminophen. Additionally, I am having her maintain her ferrous sulfate, cyclobenzaprine, albuterol, ondansetron, fluticasone, albuterol, Ubrelvy, Emgality, DULoxetine, and omeprazole. We administered methylPREDNISolone acetate.  Meds ordered this encounter  Medications   HYDROcodone-acetaminophen (NORCO/VICODIN) 5-325 MG tablet    Sig: Take 1 tablet by mouth every 6 (six) hours as needed for up to 5 days for moderate pain.    Dispense:  20 tablet    Refill:  0    methylPREDNISolone acetate (DEPO-MEDROL) injection 120 mg     Follow-up: Return in about 3 weeks (around 04/07/2022).  Scarlette Calico, MD

## 2022-03-17 NOTE — Patient Instructions (Signed)
Sciatica  Sciatica is pain, numbness, weakness, or tingling along the path of the sciatic nerve. The sciatic nerve starts in the lower back and runs down the back of each leg. The nerve controls the muscles in the lower leg and in the back of the knee. It also provides feeling (sensation) to the back of the thigh, the lower leg, and the sole of the foot. Sciatica is a symptom of another medical condition that pinches or puts pressure on the sciatic nerve. Sciatica most often only affects one side of the body. Sciatica usually goes away on its own or with treatment. In some cases, sciatica may come back (recur). What are the causes? This condition is caused by pressure on the sciatic nerve or pinching of the nerve. This may be the result of: A disk in between the bones of the spine bulging out too far (herniated disk). Age-related changes in the spinal disks. A pain disorder that affects a muscle in the buttock. Extra bone growth near the sciatic nerve. A break (fracture) of the pelvis. Pregnancy. Tumor. This is rare. What increases the risk? The following factors may make you more likely to develop this condition: Playing sports that place pressure or stress on the spine. Having poor strength and flexibility. A history of back injury or surgery. Sitting for long periods of time. Doing activities that involve repetitive bending or lifting. Obesity. What are the signs or symptoms? Symptoms can vary from mild to very severe, and they may include: Any of these problems in the lower back, leg, hip, or buttock: Mild tingling, numbness, or dull aches. Burning sensations. Sharp pains. Numbness in the back of the calf or the sole of the foot. Leg weakness. Severe back pain that makes movement difficult. Symptoms may get worse when you cough, sneeze, or laugh, or when you sit or stand for long periods of time. How is this diagnosed? This condition may be diagnosed based on: Your symptoms and  medical history. A physical exam. Blood tests. Imaging tests, such as: X-rays. MRI. CT scan. How is this treated? In many cases, this condition improves on its own without treatment. However, treatment may include: Reducing or modifying physical activity. Exercising and stretching. Icing and applying heat to the affected area. Medicines that help to: Relieve pain and swelling. Relax your muscles. Injections of medicines that help to relieve pain, irritation, and inflammation around the sciatic nerve (steroids). Surgery. Follow these instructions at home: Medicines Take over-the-counter and prescription medicines only as told by your health care provider. Ask your health care provider if the medicine prescribed to you: Requires you to avoid driving or using heavy machinery. Can cause constipation. You may need to take these actions to prevent or treat constipation: Drink enough fluid to keep your urine pale yellow. Take over-the-counter or prescription medicines. Eat foods that are high in fiber, such as beans, whole grains, and fresh fruits and vegetables. Limit foods that are high in fat and processed sugars, such as fried or sweet foods. Managing pain     If directed, put ice on the affected area. Put ice in a plastic bag. Place a towel between your skin and the bag. Leave the ice on for 20 minutes, 2-3 times a day. If directed, apply heat to the affected area. Use the heat source that your health care provider recommends, such as a moist heat pack or a heating pad. Place a towel between your skin and the heat source. Leave the heat on for  20-30 minutes. Remove the heat if your skin turns bright red. This is especially important if you are unable to feel pain, heat, or cold. You may have a greater risk of getting burned. Activity  Return to your normal activities as told by your health care provider. Ask your health care provider what activities are safe for you. Avoid  activities that make your symptoms worse. Take brief periods of rest throughout the day. When you rest for longer periods, mix in some mild activity or stretching between periods of rest. This will help to prevent stiffness and pain. Avoid sitting for long periods of time without moving. Get up and move around at least one time each hour. Exercise and stretch regularly, as told by your health care provider. Do not lift anything that is heavier than 10 lb (4.5 kg) while you have symptoms of sciatica. When you do not have symptoms, you should still avoid heavy lifting, especially repetitive heavy lifting. When you lift objects, always use proper lifting technique, which includes: Bending your knees. Keeping the load close to your body. Avoiding twisting. General instructions Maintain a healthy weight. Excess weight puts extra stress on your back. Wear supportive, comfortable shoes. Avoid wearing high heels. Avoid sleeping on a mattress that is too soft or too hard. A mattress that is firm enough to support your back when you sleep may help to reduce your pain. Keep all follow-up visits as told by your health care provider. This is important. Contact a health care provider if: You have pain that: Wakes you up when you are sleeping. Gets worse when you lie down. Is worse than you have experienced in the past. Lasts longer than 4 weeks. You have an unexplained weight loss. Get help right away if: You are not able to control when you urinate or have bowel movements (incontinence). You have: Weakness in your lower back, pelvis, buttocks, or legs that gets worse. Redness or swelling of your back. A burning sensation when you urinate. Summary Sciatica is pain, numbness, weakness, or tingling along the path of the sciatic nerve. This condition is caused by pressure on the sciatic nerve or pinching of the nerve. Sciatica can cause pain, numbness, or tingling in the lower back, legs, hips, and  buttocks. Treatment often includes rest, exercise, medicines, and applying ice or heat. This information is not intended to replace advice given to you by your health care provider. Make sure you discuss any questions you have with your health care provider. Document Revised: 11/05/2018 Document Reviewed: 11/05/2018 Elsevier Patient Education  2023 [employer]  

## 2022-03-17 NOTE — Telephone Encounter (Signed)
Called patient to inform her that I called [facility] to get her an appt. I was told by the [facility] office she was referred back to [facility]. She has an appt with Dr. Roselie Awkward 7/13. I reviewed our referral notes and states referral was sent to [facility]. I will run this by our referral coordinator consider the patient wanted to go to a different office and feels as though the appt time is far away as she has been waiting for an OBGYN appt since March.   Jazmin please advise. Thank you.    Annis Lagoy Autry-Lott, DO 03/17/2022, 11:22 AM PGY-3, [place]

## 2022-03-18 ENCOUNTER — Telehealth: Payer: Self-pay | Admitting: Internal Medicine

## 2022-03-18 NOTE — Telephone Encounter (Signed)
Dr. Ronnald Ramp ordered stat mri for this patient on 5/18 - she has not heard anything and wants to know if we can get this expedited for her.

## 2022-03-23 ENCOUNTER — Ambulatory Visit
Admission: RE | Admit: 2022-03-23 | Discharge: 2022-03-23 | Disposition: A | Discharge status: Home / Self Care | Payer: Medicaid Other | Source: Ambulatory Visit | Attending: Internal Medicine | Admitting: Internal Medicine

## 2022-03-23 DIAGNOSIS — M5442 Lumbago with sciatica, left side: Secondary | ICD-10-CM

## 2022-03-23 DIAGNOSIS — M5106 Intervertebral disc disorders with myelopathy, lumbar region: Secondary | ICD-10-CM

## 2022-03-24 ENCOUNTER — Other Ambulatory Visit: Payer: Self-pay | Admitting: Internal Medicine

## 2022-03-24 DIAGNOSIS — M5136 Other intervertebral disc degeneration, lumbar region: Secondary | ICD-10-CM

## 2022-03-24 DIAGNOSIS — M51369 Other intervertebral disc degeneration, lumbar region without mention of lumbar back pain or lower extremity pain: Secondary | ICD-10-CM

## 2022-03-24 DIAGNOSIS — M5442 Lumbago with sciatica, left side: Secondary | ICD-10-CM

## 2022-03-29 ENCOUNTER — Encounter: Payer: Self-pay | Admitting: Family Medicine

## 2022-03-29 NOTE — Telephone Encounter (Signed)
Patient called and informed to keep appt with Dr. Roselie Awkward. She voiced understanding.   Gerlene Fee, DO 03/29/2022, 4:53 PM PGY-3, [place]

## 2022-03-29 NOTE — Progress Notes (Signed)
Error.   Gerlene Fee, DO 03/29/2022, 4:40 PM PGY-3, [place]
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
## 2022-04-05 ENCOUNTER — Ambulatory Visit (INDEPENDENT_AMBULATORY_CARE_PROVIDER_SITE_OTHER): Payer: Medicaid Other | Admitting: Nurse Practitioner

## 2022-04-05 ENCOUNTER — Encounter: Payer: Self-pay | Admitting: Nurse Practitioner

## 2022-04-05 ENCOUNTER — Encounter: Payer: Self-pay | Admitting: *Deleted

## 2022-04-05 VITALS — BP 100/76 | HR 78 | Ht 66.0 in | Wt 205.6 lb

## 2022-04-05 DIAGNOSIS — E785 Hyperlipidemia, unspecified: Secondary | ICD-10-CM

## 2022-04-05 DIAGNOSIS — R079 Chest pain, unspecified: Secondary | ICD-10-CM | POA: Diagnosis not present

## 2022-04-05 DIAGNOSIS — R011 Cardiac murmur, unspecified: Secondary | ICD-10-CM | POA: Diagnosis not present

## 2022-04-05 DIAGNOSIS — R002 Palpitations: Secondary | ICD-10-CM

## 2022-04-05 DIAGNOSIS — Z8279 Family history of other congenital malformations, deformations and chromosomal abnormalities: Secondary | ICD-10-CM

## 2022-04-05 NOTE — Patient Instructions (Signed)
Medication Instructions:   Your physician recommends that you continue on your current medications as directed. Please refer to the Current Medication list given to you today.   *If you need a refill on your cardiac medications before your next appointment, please call your pharmacy*   Lab Work:  None ordered.  If you have labs (blood work) drawn today and your tests are completely normal, you will receive your results only by: [place] (if you have MyChart) OR A paper copy in the mail If you have any lab test that is abnormal or we need to change your treatment, we will call you to review the results.   Testing/Procedures:  Your physician has requested that you have an echocardiogram. Echocardiography is a painless test that uses sound waves to create images of your heart. It provides your doctor with information about the size and shape of your heart and how well your heart's chambers and valves are working. This procedure takes approximately one hour. There are no restrictions for this procedure.    Follow-Up: At [facility], you and your health needs are our priority.  As part of our continuing mission to provide you with exceptional heart care, we have created designated Provider Care Teams.  These Care Teams include your primary Cardiologist (physician) and Advanced Practice Providers (APPs -  Physician Assistants and Nurse Practitioners) who all work together to provide you with the care you need, when you need it.  We recommend signing up for the patient portal called "MyChart".  Sign up information is provided on this After Visit Summary.  MyChart is used to connect with patients for Virtual Visits (Telemedicine).  Patients are able to view lab/test results, encounter notes, upcoming appointments, etc.  Non-urgent messages can be sent to your provider as well.   To learn more about what you can do with MyChart, go to [email or web address].    Your next appointment:    1 year(s)  The format for your next appointment:   In Person  Provider:   None     Other Instructions  Your physician wants you to follow-up in: 1 year with Dr. Acie Fredrickson.  You will receive a reminder letter in the mail two months in advance. If you don't receive a letter, please call our office to schedule the follow-up appointment.  Adopting a Healthy Lifestyle.   Weight: Know what a healthy weight is for you (roughly BMI <25) and aim to maintain this. You can calculate your body mass index on your smart phone  Diet: Aim for 7+ servings of fruits and vegetables daily Limit animal fats in diet for cholesterol and heart health - choose grass fed whenever available Avoid highly processed foods (fast food burgers, tacos, fried chicken, pizza, hot dogs, french fries)  Saturated fat comes in the form of butter, lard, coconut oil, margarine, partially hydrogenated oils, and fat in meat. These increase your risk of cardiovascular disease.  Use healthy plant oils, such as olive, canola, soy, corn, sunflower and peanut.  Whole foods such as fruits, vegetables and whole grains have fiber  Men need > 38 grams of fiber per day Women need > 25 grams of fiber per day  Load up on vegetables and fruits - one-half of your plate: Aim for color and variety, and remember that potatoes dont count. Go for whole grains - one-quarter of your plate: Whole wheat, barley, wheat berries, quinoa, oats, brown rice, and foods made with them. If you want pasta, go with whole  wheat pasta. Protein power - one-quarter of your plate: Fish, chicken, beans, and nuts are all healthy, versatile protein sources. Limit red meat. You need carbohydrates for energy! The type of carbohydrate is more important than the amount. Choose carbohydrates such as vegetables, fruits, whole grains, beans, and nuts in the place of white rice, white pasta, potatoes (baked or fried), macaroni and cheese, cakes, cookies, and donuts.  If youre  thirsty, drink water. Coffee and tea are good in moderation, but skip sugary drinks and limit milk and dairy products to one or two daily servings. Keep sugar intake at 6 teaspoons or 24 grams or LESS       Exercise: Aim for 150 min of moderate intensity exercise weekly for heart health, and weights twice weekly for bone health Stay active - any steps are better than no steps! Aim for 7-9 hours of sleep daily      Mediterranean Diet A Mediterranean diet refers to food and lifestyle choices that are based on the traditions of countries located on the [employer]. It focuses on eating more fruits, vegetables, whole grains, beans, nuts, seeds, and heart-healthy fats, and eating less dairy, meat, eggs, and processed foods with added sugar, salt, and fat. This way of eating has been shown to help prevent certain conditions and improve outcomes for people who have chronic diseases, like kidney disease and heart disease. What are tips for following this plan? Reading food labels Check the serving size of packaged foods. For foods such as rice and pasta, the serving size refers to the amount of cooked product, not dry. Check the total fat in packaged foods. Avoid foods that have saturated fat or trans fats. Check the ingredient list for added sugars, such as corn syrup. Shopping  Buy a variety of foods that offer a balanced diet, including: Fresh fruits and vegetables (produce). Grains, beans, nuts, and seeds. Some of these may be available in unpackaged forms or large amounts (in bulk). Fresh seafood. Poultry and eggs. Low-fat dairy products. Buy whole ingredients instead of prepackaged foods. Buy fresh fruits and vegetables in-season from local farmers markets. Buy plain frozen fruits and vegetables. If you do not have access to quality fresh seafood, buy precooked frozen shrimp or canned fish, such as tuna, salmon, or sardines. Stock your pantry so you always have certain foods on  hand, such as olive oil, canned tuna, canned tomatoes, rice, pasta, and beans. Cooking Cook foods with extra-virgin olive oil instead of using butter or other vegetable oils. Have meat as a side dish, and have vegetables or grains as your main dish. This means having meat in small portions or adding small amounts of meat to foods like pasta or stew. Use beans or vegetables instead of meat in common dishes like chili or lasagna. Experiment with different cooking methods. Try roasting, broiling, steaming, and sauting vegetables. Add frozen vegetables to soups, stews, pasta, or rice. Add nuts or seeds for added healthy fats and plant protein at each meal. You can add these to yogurt, salads, or vegetable dishes. Marinate fish or vegetables using olive oil, lemon juice, garlic, and fresh herbs. Meal planning Plan to eat one vegetarian meal one day each week. Try to work up to two vegetarian meals, if possible. Eat seafood two or more times a week. Have healthy snacks readily available, such as: Vegetable sticks with hummus. Greek yogurt. Fruit and nut trail mix. Eat balanced meals throughout the week. This includes: Fruit: 2-3 servings a day. Vegetables: 4-5  servings a day. Low-fat dairy: 2 servings a day. Fish, poultry, or lean meat: 1 serving a day. Beans and legumes: 2 or more servings a week. Nuts and seeds: 1-2 servings a day. Whole grains: 6-8 servings a day. Extra-virgin olive oil: 3-4 servings a day. Limit red meat and sweets to only a few servings a month. Lifestyle  Cook and eat meals together with your family, when possible. Drink enough fluid to keep your urine pale yellow. Be physically active every day. This includes: Aerobic exercise like running or swimming. Leisure activities like gardening, walking, or housework. Get 7-8 hours of sleep each night. If recommended by your health care provider, drink red wine in moderation. This means 1 glass a day for nonpregnant women  and 2 glasses a day for men. A glass of wine equals 5 oz (150 mL). What foods should I eat? Fruits Apples. Apricots. Avocado. Berries. Bananas. Cherries. Dates. Figs. Grapes. Lemons. Melon. Oranges. Peaches. Plums. Pomegranate. Vegetables Artichokes. Beets. Broccoli. Cabbage. Carrots. Eggplant. Green beans. Chard. Kale. Spinach. Onions. Leeks. Peas. Squash. Tomatoes. Peppers. Radishes. Grains Whole-grain pasta. Brown rice. Bulgur wheat. Polenta. Couscous. Whole-wheat bread. [place]. Meats and other proteins Beans. Almonds. Sunflower seeds. Pine nuts. Peanuts. [place]. Salmon. Scallops. Shrimp. [place]. Tilapia. Clams. Oysters. Eggs. Poultry without skin. Dairy Low-fat milk. Cheese. Greek yogurt. Fats and oils Extra-virgin olive oil. Avocado oil. Grapeseed oil. Beverages Water. Red wine. Herbal tea. Sweets and desserts Greek yogurt with honey. Baked apples. Poached pears. Trail mix. Seasonings and condiments Basil. Cilantro. Coriander. Cumin. Mint. Parsley. Sage. Rosemary. Tarragon. Garlic. Oregano. Thyme. Pepper. Balsamic vinegar. Tahini. Hummus. Tomato sauce. Olives. Mushrooms. The items listed above may not be a complete list of foods and beverages you can eat. Contact a dietitian for more information. What foods should I limit? This is a list of foods that should be eaten rarely or only on special occasions. Fruits Fruit canned in syrup. Vegetables Deep-fried potatoes (french fries). Grains Prepackaged pasta or rice dishes. Prepackaged cereal with added sugar. Prepackaged snacks with added sugar. Meats and other proteins Beef. Pork. Lamb. Poultry with skin. Hot dogs. Berniece Salines. Dairy Ice cream. Sour cream. Whole milk. Fats and oils Butter. Canola oil. Vegetable oil. Beef fat (tallow). Lard. Beverages Juice. Sugar-sweetened soft drinks. Beer. Liquor and spirits. Sweets and desserts Cookies. Cakes. Pies. Candy. Seasonings and condiments Mayonnaise. Pre-made sauces and  marinades. The items listed above may not be a complete list of foods and beverages you should limit. Contact a dietitian for more information. Summary The Mediterranean diet includes both food and lifestyle choices. Eat a variety of fresh fruits and vegetables, beans, nuts, seeds, and whole grains. Limit the amount of red meat and sweets that you eat. If recommended by your health care provider, drink red wine in moderation. This means 1 glass a day for nonpregnant women and 2 glasses a day for men. A glass of wine equals 5 oz (150 mL). This information is not intended to replace advice given to you by your health care provider. Make sure you discuss any questions you have with your health care provider. Document Revised: 11/22/2019 Document Reviewed: 09/19/2019 Elsevier Patient Education  [place]

## 2022-04-07 ENCOUNTER — Telehealth: Payer: Self-pay | Admitting: *Deleted

## 2022-04-07 ENCOUNTER — Telehealth: Payer: Self-pay | Admitting: Family Medicine

## 2022-04-07 NOTE — Telephone Encounter (Signed)
Patient calls stating she is continuing to have breakthrough bleeding. She is wondering if she needs to get her iron checked again or what she should do. Please call patient to discuss this.

## 2022-04-07 NOTE — Telephone Encounter (Signed)
Approved. This drug has been approved. Approved quantity: 1 units per 30 day(s). The drug has been approved from 03/24/2022 to 04/07/2023. Please call the pharmacy to process your prescription claim. Generic or biosimilar substitution may be required when available and preferred on the formulary.

## 2022-04-07 NOTE — Telephone Encounter (Signed)
Submitted PA Emgality on CMM. Key: [id number]. Waiting on determination from [facility].

## 2022-04-08 NOTE — Telephone Encounter (Signed)
Bleeding easing up today but was previously heavy. Encouraged to follow up here and call GYN if desired.   Gerlene Fee, DO 04/08/2022, 5:15 PM PGY-3, [place]

## 2022-04-19 ENCOUNTER — Ambulatory Visit (HOSPITAL_COMMUNITY): Payer: Medicaid Other | Attending: Internal Medicine

## 2022-04-19 DIAGNOSIS — R079 Chest pain, unspecified: Secondary | ICD-10-CM

## 2022-04-19 DIAGNOSIS — R011 Cardiac murmur, unspecified: Secondary | ICD-10-CM

## 2022-04-19 DIAGNOSIS — E785 Hyperlipidemia, unspecified: Secondary | ICD-10-CM | POA: Diagnosis not present

## 2022-04-19 LAB — ECHOCARDIOGRAM COMPLETE
Area-P 1/2: 2.24 cm2
S' Lateral: 3.3 cm

## 2022-05-12 ENCOUNTER — Ambulatory Visit (INDEPENDENT_AMBULATORY_CARE_PROVIDER_SITE_OTHER): Payer: Medicaid Other | Admitting: Obstetrics & Gynecology

## 2022-05-12 ENCOUNTER — Other Ambulatory Visit: Payer: Self-pay

## 2022-05-12 VITALS — BP 115/77 | HR 77 | Ht 66.0 in | Wt 212.0 lb

## 2022-05-12 DIAGNOSIS — R102 Pelvic and perineal pain: Secondary | ICD-10-CM | POA: Diagnosis not present

## 2022-05-12 DIAGNOSIS — N938 Other specified abnormal uterine and vaginal bleeding: Secondary | ICD-10-CM | POA: Diagnosis not present

## 2022-05-12 NOTE — Progress Notes (Signed)
Pt reported cycles every 2 weeks
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
## 2022-05-16 ENCOUNTER — Telehealth: Payer: Self-pay | Admitting: General Practice

## 2022-05-16 NOTE — Telephone Encounter (Signed)
Called patient and discussed need to come in and sign hysterectomy statement paper at least 30 days prior to surgery. Patient verbalized understanding and states she will come by tomorrow to sign it.

## 2022-05-17 ENCOUNTER — Ambulatory Visit: Payer: Medicaid Other | Admitting: Family Medicine

## 2022-05-17 ENCOUNTER — Other Ambulatory Visit (HOSPITAL_COMMUNITY): Payer: Self-pay

## 2022-05-17 ENCOUNTER — Encounter: Payer: Self-pay | Admitting: Family Medicine

## 2022-05-17 VITALS — BP 113/75 | HR 75 | Ht 66.0 in | Wt 213.0 lb

## 2022-05-17 DIAGNOSIS — G43009 Migraine without aura, not intractable, without status migrainosus: Secondary | ICD-10-CM | POA: Diagnosis not present

## 2022-05-17 MED ORDER — EMGALITY 120 MG/ML ~~LOC~~ SOAJ
120.0000 mg | SUBCUTANEOUS | 3 refills | Status: DC
  Filled 2022-05-17: qty 1, 30d supply, fill #0
  Filled 2022-06-13: qty 3, 90d supply, fill #0
  Filled 2023-02-08: qty 3, 90d supply, fill #1

## 2022-05-17 MED ORDER — EMGALITY 120 MG/ML ~~LOC~~ SOAJ: 120.0000 mg | SUBCUTANEOUS | 0 refills | Status: DC

## 2022-05-17 MED ORDER — UBRELVY 100 MG PO TABS
1.0000 | ORAL_TABLET | Freq: Every day | ORAL | 11 refills | Status: DC | PRN
  Filled 2022-05-17 – 2022-06-13 (×2): qty 8, 30d supply, fill #0
  Filled 2023-02-08: qty 8, 30d supply, fill #1

## 2022-05-17 NOTE — Patient Instructions (Signed)
Below is our plan:  We will restart Emgality. Continue Roselyn Meier.   Please make sure you are staying well hydrated. I recommend 50-60 ounces daily. Well balanced diet and regular exercise encouraged. Consistent sleep schedule with 6-8 hours recommended.   Please continue follow up with care team as directed.   Follow up with me in 1 year   You may receive a survey regarding today's visit. I encourage you to leave honest feed back as I do use this information to improve patient care. Thank you for seeing me today!
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
## 2022-05-25 ENCOUNTER — Other Ambulatory Visit (HOSPITAL_COMMUNITY): Payer: Self-pay

## 2022-06-03 ENCOUNTER — Telehealth: Payer: Self-pay

## 2022-06-03 NOTE — Telephone Encounter (Signed)
Called Pt again regarding the Hysterectomy Statement, she stated that she will come in Monday 06/06/22 to re-sign form.

## 2022-06-03 NOTE — Telephone Encounter (Signed)
Called Pt to ask is she could come back to office to re-sign The Hysterectomy Form , it has missing information, no answer, left VM.

## 2022-06-06 ENCOUNTER — Other Ambulatory Visit: Payer: Self-pay

## 2022-06-06 ENCOUNTER — Other Ambulatory Visit (HOSPITAL_COMMUNITY)
Admission: RE | Admit: 2022-06-06 | Discharge: 2022-06-06 | Disposition: A | Discharge status: Home / Self Care | Payer: Medicaid Other | Source: Ambulatory Visit | Attending: Obstetrics & Gynecology | Admitting: Obstetrics & Gynecology

## 2022-06-06 ENCOUNTER — Ambulatory Visit (INDEPENDENT_AMBULATORY_CARE_PROVIDER_SITE_OTHER): Payer: Medicaid Other | Admitting: Obstetrics and Gynecology

## 2022-06-06 ENCOUNTER — Encounter: Payer: Self-pay | Admitting: Obstetrics and Gynecology

## 2022-06-06 VITALS — BP 116/79 | HR 68 | Ht 66.0 in | Wt 212.3 lb

## 2022-06-06 DIAGNOSIS — N938 Other specified abnormal uterine and vaginal bleeding: Secondary | ICD-10-CM | POA: Diagnosis present

## 2022-06-06 DIAGNOSIS — N898 Other specified noninflammatory disorders of vagina: Secondary | ICD-10-CM | POA: Insufficient documentation

## 2022-06-06 DIAGNOSIS — Z3202 Encounter for pregnancy test, result negative: Secondary | ICD-10-CM

## 2022-06-06 HISTORY — PX: ENDOMETRIAL BIOPSY: SHX622

## 2022-06-06 LAB — POCT PREGNANCY, URINE: Preg Test, Ur: NEGATIVE

## 2022-06-06 NOTE — Progress Notes (Signed)
      GYNECOLOGY OFFICE PROCEDURE NOTE   Meghan Reed is a 36 y.o. [phone] here for endometrial biopsy for AUB. Recent ultrasound also showed EL 6.68m.  Today, she reports no concerning symptoms. Of note, pap on 02/2020 was normal, negative HPV.   She otherwise notes vaginal discharge that is not associated with odor or itching but would like to have it checked while I am doing her exam.    ENDOMETRIAL BIOPSY     The indications for endometrial biopsy were reviewed.   Risks of the biopsy including cramping, bleeding, infection, uterine perforation, inadequate specimen and need for additional procedures were discussed. Offered alternative of hysteroscopy, dilation and curettage in OR. The patient states she understands the R/B/I/A and agrees to undergo procedure today. Urine pregnancy test was Negative. Consent was signed. Time out was performed.    Patient was positioned in dorsal lithotomy position. A vaginal speculum was placed.  The cervix was visualized and was prepped with Betadine.  A single-toothed tenaculum was placed on the anterior lip of the cervix to stabilize it. The 3 mm pipelle was easily introduced into the endometrial cavity without difficulty to a depth of 8 cm, and a Moderate amount of tissue was obtained after two passes and sent to pathology. The instruments were removed from the patient's vagina. Minimal bleeding from the cervix was noted. The patient tolerated the procedure well.   Patient was given post procedure instructions.  Will follow up pathology and manage accordingly; patient will be contacted with results and recommendations.  Routine preventative health maintenance measures emphasized.       PRadene Gunning MD, FWallfor [facility]

## 2022-06-07 LAB — CERVICOVAGINAL ANCILLARY ONLY
Bacterial Vaginitis (gardnerella): NEGATIVE
Candida Glabrata: NEGATIVE
Candida Vaginitis: NEGATIVE
Chlamydia: NEGATIVE
Comment: NEGATIVE
Comment: NEGATIVE
Comment: NEGATIVE
Comment: NEGATIVE
Comment: NEGATIVE
Comment: NORMAL
Neisseria Gonorrhea: NEGATIVE
Trichomonas: NEGATIVE

## 2022-06-08 LAB — SURGICAL PATHOLOGY

## 2022-06-13 ENCOUNTER — Other Ambulatory Visit (HOSPITAL_COMMUNITY): Payer: Self-pay

## 2022-06-15 ENCOUNTER — Other Ambulatory Visit (HOSPITAL_COMMUNITY): Payer: Self-pay

## 2022-06-19 ENCOUNTER — Encounter: Payer: Self-pay | Admitting: Radiology

## 2022-06-22 ENCOUNTER — Encounter (HOSPITAL_BASED_OUTPATIENT_CLINIC_OR_DEPARTMENT_OTHER): Payer: Self-pay | Admitting: Obstetrics & Gynecology

## 2022-06-22 ENCOUNTER — Other Ambulatory Visit: Payer: Self-pay

## 2022-06-22 NOTE — Progress Notes (Signed)
Spoke w/ via phone for pre-op interview---[facility] needs dos----urine pregnancy per anesthesia, surgeon orders pending as of 06/22/22               Lab results------07/08/22 lab appt for cbc, type & screen, 01/04/22 long term monitor in Epic, 01/11/22 Exercise tolerance test in Epic, 12/06/21 chest xray in Epic, 04/19/22 Echocardiogram in Epic (EF 50 to 55%), 04/06/22 EKG in chart & Epic COVID test -----patient states asymptomatic no test needed Arrive at -------0630 on Wednesday, 07/13/22 NPO after MN NO Solid Food.  Clear liquids from MN until---0530 Med rec completed Medications to take morning of surgery -----Albuterol inhaler & nebulizer prn, Flovent prn, Omeprazole prn, Zofran prn, Ubrevly prn Diabetic medication -----n/a Patient instructed no nail polish to be worn day of surgery Patient instructed to bring photo id and insurance card day of surgery Patient aware to have Driver (ride ) / caregiver    for 24 hours after surgery - Mom, Verdis Frederickson & Dad Patient Special Instructions -----Bring inhaler. Extended recovery instructions given. Pre-Op special Istructions ----Requested orders from Dr. Roselie Awkward via Epic IB on 06/22/22. Patient verbalized understanding of instructions that were given at this phone interview. Patient denies shortness of breath, chest pain, fever, cough at this phone interview.

## 2022-06-22 NOTE — Progress Notes (Signed)
Your procedure is scheduled on Wednesday, 07/13/22.  Report to [facility].   Call this number if you have problems the morning of surgery  :[phone].   OUR ADDRESS IS [place].  WE ARE LOCATED IN [facility].  PLEASE BRING YOUR INSURANCE CARD AND PHOTO ID DAY OF SURGERY.  ONLY 2 PEOPLE ARE ALLOWED IN  WAITING  ROOM.                                      REMEMBER:  DO NOT EAT FOOD, CANDY GUM OR MINTS  AFTER MIDNIGHT THE NIGHT BEFORE YOUR SURGERY . YOU MAY HAVE CLEAR LIQUIDS FROM MIDNIGHT THE NIGHT BEFORE YOUR SURGERY UNTIL  5:30 AM. NO CLEAR LIQUIDS AFTER   5:30 AM DAY OF SURGERY.  YOU MAY  BRUSH YOUR TEETH MORNING OF SURGERY AND RINSE YOUR MOUTH OUT, NO CHEWING GUM CANDY OR MINTS.     CLEAR LIQUID DIET   Foods Allowed                                                                     Foods Excluded  Coffee and tea, regular and decaf                             liquids that you cannot  Plain Jell-O                                                                   see through such as: Fruit ices (not with fruit pulp)                                     milk, soups, orange juice  Plain  Popsicles                                    All solid food Carbonated beverages, regular and diet                                    Cranberry, grape and apple juices Sports drinks like Gatorade _____________________________________________________________________    Take only these medications if needed the morning of surgery: Albuterol inhaler & nebulizer, Flovent, Omeprazole, Zofran, & Ubrevly    UP TO 4 VISITORS  MAY VISIT IN THE EXTENDED RECOVERY ROOM UNTIL 800 PM ONLY.  ONE  VISITOR AGE 42 AND OVER MAY SPEND THE NIGHT AND MUST BE IN EXTENDED RECOVERY ROOM NO LATER THAN 800 PM . YOUR DISCHARGE TIME AFTER YOU SPEND THE NIGHT IS 900 AM THE MORNING AFTER YOUR SURGERY.  YOU MAY PACK  A SMALL OVERNIGHT BAG WITH TOILETRIES FOR YOUR  OVERNIGHT STAY IF YOU WISH.  YOUR PRESCRIPTION MEDICATIONS WILL BE PROVIDED DURING [place].                                      DO NOT WEAR JEWERLY, MAKE UP. DO NOT WEAR LOTIONS, POWDERS, PERFUMES OR NAIL POLISH ON YOUR FINGERNAILS. TOENAIL POLISH IS OK TO WEAR. DO NOT SHAVE FOR 48 HOURS PRIOR TO DAY OF SURGERY. MEN MAY SHAVE FACE AND NECK. CONTACTS, GLASSES, OR DENTURES MAY NOT BE WORN TO SURGERY.  REMEMBER: NO SMOKING, DRUGS OR ALCOHOL FOR 24 HOURS BEFORE YOUR SURGERY.                                    [facility] IS NOT RESPONSIBLE  FOR ANY BELONGINGS.                                                                    Marland Kitchen           [place] - Preparing for Surgery Before surgery, you can play an important role.  Because skin is not sterile, your skin needs to be as free of germs as possible.  You can reduce the number of germs on your skin by washing with CHG (chlorahexidine gluconate) soap before surgery.  CHG is an antiseptic cleaner which kills germs and bonds with the skin to continue killing germs even after washing. Please DO NOT use if you have an allergy to CHG or antibacterial soaps.  If your skin becomes reddened/irritated stop using the CHG and inform your nurse when you arrive at Short Stay. Do not shave (including legs and underarms) for at least 48 hours prior to the first CHG shower.  You may shave your face/neck. Please follow these instructions carefully:  1.  Shower with CHG Soap the night before surgery and the  morning of Surgery.  2.  If you choose to wash your hair, wash your hair first as usual with your  normal  shampoo.  3.  After you shampoo, rinse your hair and body thoroughly to remove the  shampoo.                            4.  Use CHG as you would any other liquid soap.  You can apply chg directly  to the skin and wash , please wash your belly button thoroughly with chg soap provided night before and morning of your surgery.                      Gently with a scrungie or clean washcloth.  5.  Apply the CHG Soap to your body ONLY FROM THE NECK DOWN.   Do not use on face/ open                           Wound or open sores. Avoid contact with eyes, ears mouth and genitals (private parts).  Wash face,  Genitals (private parts) with your normal soap.             6.  Wash thoroughly, paying special attention to the area where your surgery  will be performed.  7.  Thoroughly rinse your body with warm water from the neck down.  8.  DO NOT shower/wash with your normal soap after using and rinsing off  the CHG Soap.                9.  Pat yourself dry with a clean towel.            10.  Wear clean pajamas.            11.  Place clean sheets on your bed the night of your first shower and do not  sleep with pets. Day of Surgery : Do not apply any lotions/deodorants the morning of surgery.  Please wear clean clothes to the hospital/surgery center.  IF YOU HAVE ANY SKIN IRRITATION OR PROBLEMS WITH THE SURGICAL SOAP, PLEASE GET A BAR OF GOLD DIAL SOAP AND SHOWER THE NIGHT BEFORE YOUR SURGERY AND THE MORNING OF YOUR SURGERY. PLEASE LET THE NURSE KNOW MORNING OF YOUR SURGERY IF YOU HAD ANY PROBLEMS WITH THE SURGICAL SOAP.   ________________________________________________________________________                                                        QUESTIONS [facility] PRE OP NURSE PHONE [phone].

## 2022-06-27 ENCOUNTER — Other Ambulatory Visit: Payer: Self-pay | Admitting: Obstetrics & Gynecology

## 2022-06-27 DIAGNOSIS — N938 Other specified abnormal uterine and vaginal bleeding: Secondary | ICD-10-CM

## 2022-07-07 ENCOUNTER — Ambulatory Visit: Payer: Medicaid Other | Admitting: Internal Medicine

## 2022-07-07 ENCOUNTER — Other Ambulatory Visit (HOSPITAL_COMMUNITY): Payer: Self-pay

## 2022-07-07 ENCOUNTER — Encounter: Payer: Self-pay | Admitting: Internal Medicine

## 2022-07-07 ENCOUNTER — Ambulatory Visit (INDEPENDENT_AMBULATORY_CARE_PROVIDER_SITE_OTHER): Payer: Medicaid Other | Admitting: Internal Medicine

## 2022-07-07 VITALS — BP 112/76 | HR 87 | Temp 98.6°F | Resp 16 | Ht 66.0 in | Wt 210.0 lb

## 2022-07-07 DIAGNOSIS — J040 Acute laryngitis: Secondary | ICD-10-CM | POA: Insufficient documentation

## 2022-07-07 DIAGNOSIS — J060 Acute laryngopharyngitis: Secondary | ICD-10-CM | POA: Diagnosis not present

## 2022-07-07 LAB — POC COVID19 BINAXNOW: SARS Coronavirus 2 Ag: NEGATIVE

## 2022-07-07 LAB — POCT RAPID STREP A (OFFICE): Rapid Strep A Screen: NEGATIVE

## 2022-07-07 MED ORDER — METHYLPREDNISOLONE 4 MG PO TBPK
ORAL_TABLET | ORAL | 0 refills | Status: AC
  Filled 2022-07-07: qty 21, 6d supply, fill #0

## 2022-07-07 NOTE — Patient Instructions (Signed)
Laryngitis  Laryngitis is inflammation of the vocal cords that causes symptoms such as hoarseness or loss of voice. The vocal cords are two bands of muscles in your throat. When you speak, these cords come together and vibrate. The vibrations come out through your mouth as sound. When your vocal cords are inflamed, your voice sounds different. Laryngitis can be temporary (acute) or long-term (chronic). Most cases of acute laryngitis improve with time. Chronic laryngitis is laryngitis that lasts for more than 3 weeks. What are the causes? Acute laryngitis may be caused by: A viral infection. Lots of talking, yelling, or singing. This is also called vocal strain. A bacterial infection. Chronic laryngitis may be caused by: Vocal strain or an injury to the vocal cords. Acid reflux (gastroesophageal reflux disease, or GERD). Allergies, a sinus infection, or postnasal drip. Smoking. Excessive alcohol use. Breathing in chemicals or dust. Growths on the vocal cords. What increases the risk? The following factors may make you more likely to develop this condition: Smoking. Alcohol abuse. Having allergies. Chronic irritants in the workplace, such as toxic fumes. What are the signs or symptoms? Symptoms of this condition may include: Low, hoarse voice. Loss of voice. Dry cough. Sore or dry throat. Stuffy or congested nose. How is this diagnosed? This condition may be diagnosed based on: Your symptoms and a physical exam. Throat culture. Blood test. A procedure in which your health care provider looks at your vocal cords with a mirror or viewing tube (laryngoscopy). How is this treated? Treatment for laryngitis depends on what is causing it. Usually, treatment involves resting your voice and using medicines to soothe your throat. If your laryngitis is caused by a bacterial infection, you may need to take antibiotic medicine. If your laryngitis is caused by a growth, you may need to have a  procedure to remove it. Follow these instructions at home: Medicines Take over-the-counter and prescription medicines only as told by your health care provider. If you were prescribed an antibiotic medicine, take it as told by your health care provider. Do not stop taking the antibiotic even if you start to feel better. Use throat lozenges or sprays to soothe your throat as told by your health care provider. General instructions  Talk as little as possible. To do this: Write instead of talking. Do this until your voice is back to normal. Avoid whispering, which can cause vocal strain. Gargle with a mixture of salt and water 3-4 times a day or as needed. To make salt water, completely dissolve -1 tsp (3-6 g) of salt in 1 cup (237 mL) of warm water. Drink enough fluid to keep your urine pale yellow. Breathe in moist air. Use a humidifier if you live in a dry climate. Do not use any products that contain nicotine or tobacco. These products include cigarettes, chewing tobacco, and vaping devices, such as e-cigarettes. If you need help quitting, ask your health care provider. Contact a health care provider if: You have a fever. You have increasing pain. Your symptoms do not get better in 2 weeks. Get help right away if: You cough up blood. You have difficulty swallowing. You have trouble breathing. Summary Laryngitis is inflammation of the vocal cords that causes symptoms such as hoarseness or loss of voice. Laryngitis can be temporary or long-term. Treatment for laryngitis depends on the cause. It often involves resting your voice and using medicine to soothe your throat. Get help right away if you have difficulty swallowing or breathing or if you cough   up blood. This information is not intended to replace advice given to you by your health care provider. Make sure you discuss any questions you have with your health care provider. Document Revised: 01/04/2021 Document Reviewed:  01/04/2021 Elsevier Patient Education  2023 Elsevier Inc.  

## 2022-07-07 NOTE — Progress Notes (Signed)
Subjective:  Patient ID: Meghan Reed, female    DOB: 08-12-1986  Age: 36 y.o. MRN: [id number]  CC: Sore Throat   HPI Sherren Kerns Scruggs-Hernandez presents for f/up -  She complains of a 1 day history of scratchy throat and laryngitis.  She denies cough, night sweats, lymphadenopathy, fever, or chills.  She feels like there is fluid in her ears.  Outpatient Medications Prior to Visit  Medication Sig Dispense Refill   albuterol (PROVENTIL) (2.5 MG/3ML) 0.083% nebulizer solution Take 3 mLs (2.5 mg total) by nebulization every 6 (six) hours as needed for wheezing or shortness of breath. 150 mL 1   albuterol (VENTOLIN HFA) 108 (90 Base) MCG/ACT inhaler Inhale 2 puffs into the lungs every 4 (four) hours as needed for wheezing or shortness of breath. 1 each 0   cyclobenzaprine (FLEXERIL) 10 MG tablet Take 1 tablet (10 mg total) by mouth at bedtime as needed for muscle spasms. 60 tablet 0   ferrous sulfate 325 (65 FE) MG tablet Take 1 tablet (325 mg total) by mouth daily with breakfast. 30 tablet 3   fluticasone (FLOVENT HFA) 110 MCG/ACT inhaler Inhale 2 puffs into the lungs 2 (two) times daily. (Patient taking differently: Inhale 2 puffs into the lungs 2 (two) times daily as needed.) 1 each 5   Galcanezumab-gnlm (EMGALITY) 120 MG/ML SOAJ Inject 120 mg into the skin every 30 days. 3 mL 3   omeprazole (PRILOSEC OTC) 20 MG tablet Take 1 tablet (20 mg total) by mouth daily. (Patient taking differently: Take 20 mg by mouth as needed.) 30 tablet 3   ondansetron (ZOFRAN-ODT) 8 MG disintegrating tablet Take 0.5-1 tablets (4-8 mg total) by mouth every 8 (eight) hours as needed. 30 tablet 6   Ubrogepant (UBRELVY) 100 MG TABS Take 1 tablet by mouth daily as needed. 8 tablet 11   No facility-administered medications prior to visit.    ROS Review of Systems  Constitutional: Negative.  Negative for chills, diaphoresis, fatigue and fever.  HENT:  Positive for sore throat and voice  change. Negative for sinus pressure and trouble swallowing.   Eyes: Negative.   Respiratory:  Negative for cough, chest tightness, shortness of breath and wheezing.   Cardiovascular:  Negative for chest pain, palpitations and leg swelling.  Gastrointestinal:  Negative for abdominal pain, diarrhea, nausea and vomiting.  Endocrine: Negative.   Genitourinary: Negative.  Negative for difficulty urinating.  Musculoskeletal: Negative.  Negative for arthralgias, joint swelling and myalgias.  Skin: Negative.  Negative for rash.  Allergic/Immunologic: Negative.   Neurological: Negative.   Hematological: Negative.  Negative for adenopathy. Does not bruise/bleed easily.  Psychiatric/Behavioral: Negative.      Objective:  BP 112/76 (BP Location: Left Arm, Patient Position: Sitting, Cuff Size: Large)   Pulse 87   Temp 98.6 F (37 C) (Oral)   Resp 16   Ht '5\' 6"'$  (1.676 m)   Wt 210 lb (95.3 kg)   LMP 06/22/2022 (Exact Date)   SpO2 95%   BMI 33.89 kg/m   BP Readings from Last 3 Encounters:  07/07/22 112/76  06/06/22 116/79  05/17/22 113/75    Wt Readings from Last 3 Encounters:  07/07/22 210 lb (95.3 kg)  06/06/22 212 lb 4.8 oz (96.3 kg)  05/17/22 213 lb (96.6 kg)    Physical Exam Vitals reviewed.  Constitutional:      Appearance: She is not ill-appearing.  HENT:     Right Ear: Hearing, tympanic membrane, ear canal and external ear normal.  No middle ear effusion. Tympanic membrane is not injected.     Left Ear: Hearing, tympanic membrane, ear canal and external ear normal.  No middle ear effusion. Tympanic membrane is not injected.     Nose: Nose normal.     Mouth/Throat:     Lips: Pink.     Mouth: Mucous membranes are moist.     Palate: No mass.     Pharynx: Posterior oropharyngeal erythema present. No pharyngeal swelling, oropharyngeal exudate or uvula swelling.     Tonsils: No tonsillar exudate.  Eyes:     General: No scleral icterus.    Conjunctiva/sclera: Conjunctivae  normal.  Cardiovascular:     Rate and Rhythm: Normal rate and regular rhythm.     Heart sounds: No murmur heard. Pulmonary:     Effort: Pulmonary effort is normal.     Breath sounds: No stridor. No wheezing, rhonchi or rales.  Abdominal:     General: Abdomen is flat.     Tenderness: There is no abdominal tenderness. There is no guarding.     Hernia: No hernia is present.  Musculoskeletal:     Cervical back: Neck supple.     Right lower leg: No edema.     Left lower leg: No edema.  Lymphadenopathy:     Head:     Right side of head: No occipital adenopathy.     Left side of head: No occipital adenopathy.     Cervical: No cervical adenopathy.     Right cervical: No superficial, deep or posterior cervical adenopathy.    Left cervical: No superficial, deep or posterior cervical adenopathy.     Upper Body:     Right upper body: No supraclavicular or axillary adenopathy.     Left upper body: No supraclavicular or axillary adenopathy.  Skin:    General: Skin is warm and dry.     Findings: No rash.  Neurological:     General: No focal deficit present.     Mental Status: She is alert and oriented to person, place, and time. Mental status is at baseline.  Psychiatric:        Mood and Affect: Mood normal.        Behavior: Behavior normal.     Lab Results  Component Value Date   WBC 9.0 12/17/2021   HGB 13.6 12/17/2021   HCT 39.3 12/17/2021   PLT 289 12/17/2021   GLUCOSE 90 12/06/2021   CHOL 234 (H) 08/16/2021   TRIG 78 08/16/2021   HDL 55 08/16/2021   LDLCALC 165 (H) 08/16/2021   ALT 18 05/31/2021   AST 17 05/31/2021   NA 139 12/06/2021   K 3.9 12/06/2021   CL 103 12/06/2021   CREATININE 0.68 12/06/2021   BUN 10 12/06/2021   CO2 27 12/06/2021   TSH 0.909 12/21/2021   INR 1.5 (H) 10/31/2019   HGBA1C 5.2 08/12/2019    No results found.  Assessment & Plan:   Mattelyn was seen today for sore throat.  Diagnoses and all orders for this visit:  Sore throat and  laryngitis- She reports that a COVID test was normal earlier today.  Her strep test is negative.  Will treat for viral causes with methylprednisolone.  I recommended voice rest. -     POCT rapid strep A -     POC COVID-19 -     methylPREDNISolone (MEDROL DOSEPAK) 4 MG TBPK tablet; TAKE AS DIRECTED   I am having Bubba Hales. Scruggs-Hernandez start on methylPREDNISolone.  I am also having her maintain her ferrous sulfate, cyclobenzaprine, albuterol, ondansetron, fluticasone, albuterol, omeprazole, Emgality, and Ubrelvy.  Meds ordered this encounter  Medications   methylPREDNISolone (MEDROL DOSEPAK) 4 MG TBPK tablet    Sig: TAKE AS DIRECTED    Dispense:  21 tablet    Refill:  0     Follow-up: Return if symptoms worsen or fail to improve.  Scarlette Calico, MD

## 2022-07-08 ENCOUNTER — Encounter (HOSPITAL_COMMUNITY)
Admission: RE | Admit: 2022-07-08 | Discharge: 2022-07-08 | Disposition: A | Discharge status: Home / Self Care | Payer: Medicaid Other | Source: Ambulatory Visit | Attending: Obstetrics & Gynecology | Admitting: Obstetrics & Gynecology

## 2022-07-08 DIAGNOSIS — Z01818 Encounter for other preprocedural examination: Secondary | ICD-10-CM

## 2022-07-08 DIAGNOSIS — Z01812 Encounter for preprocedural laboratory examination: Secondary | ICD-10-CM | POA: Diagnosis present

## 2022-07-08 LAB — CBC
HCT: 40 % (ref 36.0–46.0)
Hemoglobin: 13.5 g/dL (ref 12.0–15.0)
MCH: 30.7 pg (ref 26.0–34.0)
MCHC: 33.8 g/dL (ref 30.0–36.0)
MCV: 90.9 fL (ref 80.0–100.0)
Platelets: 206 10*3/uL (ref 150–400)
RBC: 4.4 MIL/uL (ref 3.87–5.11)
RDW: 12.5 % (ref 11.5–15.5)
WBC: 7.5 10*3/uL (ref 4.0–10.5)
nRBC: 0 % (ref 0.0–0.2)

## 2022-07-13 DIAGNOSIS — Z01818 Encounter for other preprocedural examination: Secondary | ICD-10-CM

## 2022-07-13 LAB — TYPE AND SCREEN
ABO/RH(D): B POS
Antibody Screen: NEGATIVE

## 2022-07-14 ENCOUNTER — Encounter (HOSPITAL_COMMUNITY): Payer: Self-pay | Admitting: Obstetrics & Gynecology

## 2022-07-14 NOTE — Progress Notes (Signed)
Patient had a PAT appointment at [facility] on 06/22/22 for this surgery.  Updated information and gave patient instructions for day of surgery.  Patient can have clear liquids until 10 AM DOS.  Nothing after 10 AM.   Patient verbalized understanding of the instructions given via phone.

## 2022-07-14 NOTE — Anesthesia Preprocedure Evaluation (Signed)
Anesthesia Evaluation  Patient identified by MRN, date of birth, ID band Patient awake    Reviewed: Allergy & Precautions, NPO status , Patient's Chart, lab work & pertinent test results  History of Anesthesia Complications Negative for: history of anesthetic complications  Airway Mallampati: II  TM Distance: >3 FB Neck ROM: Full    Dental  (+) Dental Advisory Given, Teeth Intact   Pulmonary asthma , former smoker, PE   Pulmonary exam normal        Cardiovascular METS: > 9 Mets Normal cardiovascular exam  ETT 01/11/22: normal exercise capacity (10.1 METS), no ST deviation noted, negative for ischemia  Normal echo 04/19/22   Neuro/Psych  Headaches, Anxiety    GI/Hepatic Neg liver ROS, GERD  ,  Endo/Other  negative endocrine ROS  Renal/GU negative Renal ROS  negative genitourinary   Musculoskeletal negative musculoskeletal ROS (+)   Abdominal   Peds  Hematology negative hematology ROS (+)   Anesthesia Other Findings   Reproductive/Obstetrics DUB                           Anesthesia Physical Anesthesia Plan  ASA: 2  Anesthesia Plan: General   Post-op Pain Management: Tylenol PO (pre-op)* and Toradol IV (intra-op)*   Induction: Intravenous  PONV Risk Score and Plan: 4 or greater and Ondansetron, Dexamethasone, Treatment may vary due to age or medical condition and Midazolam  Airway Management Planned: Oral ETT  Additional Equipment: None  Intra-op Plan:   Post-operative Plan: Extubation in OR  Informed Consent: I have reviewed the patients History and Physical, chart, labs and discussed the procedure including the risks, benefits and alternatives for the proposed anesthesia with the patient or authorized representative who has indicated his/her understanding and acceptance.     Dental advisory given  Plan Discussed with:   Anesthesia Plan Comments: (PAT note written  07/14/2022 by Myra Gianotti, PA-C. )       Anesthesia Quick Evaluation
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
## 2022-07-15 ENCOUNTER — Ambulatory Visit (HOSPITAL_BASED_OUTPATIENT_CLINIC_OR_DEPARTMENT_OTHER): Payer: Medicaid Other | Admitting: Anesthesiology

## 2022-07-15 ENCOUNTER — Encounter (HOSPITAL_COMMUNITY): Payer: Self-pay | Admitting: Obstetrics & Gynecology

## 2022-07-15 ENCOUNTER — Encounter (HOSPITAL_COMMUNITY)
Admission: RE | Disposition: A | Discharge status: Home / Self Care | Payer: Self-pay | Source: Home / Self Care | Attending: Obstetrics & Gynecology

## 2022-07-15 ENCOUNTER — Ambulatory Visit (HOSPITAL_COMMUNITY): Payer: Medicaid Other | Admitting: Vascular Surgery

## 2022-07-15 ENCOUNTER — Ambulatory Visit (HOSPITAL_COMMUNITY)
Admission: RE | Admit: 2022-07-15 | Discharge: 2022-07-15 | Disposition: A | Discharge status: Home / Self Care | Payer: Medicaid Other | Attending: Obstetrics & Gynecology | Admitting: Obstetrics & Gynecology

## 2022-07-15 ENCOUNTER — Other Ambulatory Visit: Payer: Self-pay

## 2022-07-15 DIAGNOSIS — N920 Excessive and frequent menstruation with regular cycle: Secondary | ICD-10-CM | POA: Insufficient documentation

## 2022-07-15 DIAGNOSIS — N921 Excessive and frequent menstruation with irregular cycle: Secondary | ICD-10-CM

## 2022-07-15 DIAGNOSIS — N941 Unspecified dyspareunia: Secondary | ICD-10-CM

## 2022-07-15 DIAGNOSIS — Z86711 Personal history of pulmonary embolism: Secondary | ICD-10-CM | POA: Diagnosis not present

## 2022-07-15 DIAGNOSIS — N938 Other specified abnormal uterine and vaginal bleeding: Secondary | ICD-10-CM | POA: Diagnosis present

## 2022-07-15 DIAGNOSIS — K219 Gastro-esophageal reflux disease without esophagitis: Secondary | ICD-10-CM | POA: Diagnosis not present

## 2022-07-15 DIAGNOSIS — J45909 Unspecified asthma, uncomplicated: Secondary | ICD-10-CM | POA: Insufficient documentation

## 2022-07-15 DIAGNOSIS — N946 Dysmenorrhea, unspecified: Secondary | ICD-10-CM

## 2022-07-15 DIAGNOSIS — D259 Leiomyoma of uterus, unspecified: Secondary | ICD-10-CM | POA: Diagnosis not present

## 2022-07-15 DIAGNOSIS — N8003 Adenomyosis of the uterus: Secondary | ICD-10-CM

## 2022-07-15 DIAGNOSIS — F419 Anxiety disorder, unspecified: Secondary | ICD-10-CM | POA: Diagnosis not present

## 2022-07-15 DIAGNOSIS — Z87891 Personal history of nicotine dependence: Secondary | ICD-10-CM | POA: Diagnosis not present

## 2022-07-15 DIAGNOSIS — Z01818 Encounter for other preprocedural examination: Secondary | ICD-10-CM

## 2022-07-15 HISTORY — DX: Unspecified osteoarthritis, unspecified site: M19.90

## 2022-07-15 HISTORY — PX: VAGINAL HYSTERECTOMY: SHX2639

## 2022-07-15 HISTORY — DX: Nausea with vomiting, unspecified: R11.2

## 2022-07-15 HISTORY — DX: Migraine, unspecified, not intractable, without status migrainosus: G43.909

## 2022-07-15 HISTORY — DX: Presence of spectacles and contact lenses: Z97.3

## 2022-07-15 HISTORY — DX: Cardiac murmur, unspecified: R01.1

## 2022-07-15 HISTORY — DX: Gastro-esophageal reflux disease without esophagitis: K21.9

## 2022-07-15 HISTORY — DX: Palpitations: R00.2

## 2022-07-15 HISTORY — DX: Other specified postprocedural states: Z98.890

## 2022-07-15 LAB — POCT PREGNANCY, URINE: Preg Test, Ur: NEGATIVE

## 2022-07-15 LAB — TYPE AND SCREEN
ABO/RH(D): B POS
Antibody Screen: NEGATIVE

## 2022-07-15 SURGERY — HYSTERECTOMY, VAGINAL
Anesthesia: General | Site: Vagina | Laterality: Bilateral

## 2022-07-15 MED ORDER — POVIDONE-IODINE 10 % EX SWAB
2.0000 | Freq: Once | CUTANEOUS | Status: AC
  Administered 2022-07-15: 2 via TOPICAL

## 2022-07-15 MED ORDER — LACTATED RINGERS IV SOLN: INTRAVENOUS | Status: DC

## 2022-07-15 MED ORDER — ONDANSETRON 8 MG PO TBDP: 8.0000 mg | ORAL_TABLET | Freq: Three times a day (TID) | ORAL | 0 refills | Status: DC | PRN

## 2022-07-15 MED ORDER — DEXAMETHASONE SODIUM PHOSPHATE 10 MG/ML IJ SOLN
INTRAMUSCULAR | Status: DC | PRN
  Administered 2022-07-15: 10 mg via INTRAVENOUS

## 2022-07-15 MED ORDER — ROCURONIUM BROMIDE 10 MG/ML (PF) SYRINGE
PREFILLED_SYRINGE | INTRAVENOUS | Status: AC
  Filled 2022-07-15: qty 10

## 2022-07-15 MED ORDER — BUPIVACAINE-EPINEPHRINE (PF) 0.5% -1:200000 IJ SOLN
INTRAMUSCULAR | Status: AC
  Filled 2022-07-15: qty 30

## 2022-07-15 MED ORDER — LIDOCAINE 2% (20 MG/ML) 5 ML SYRINGE
INTRAMUSCULAR | Status: DC | PRN
  Administered 2022-07-15: 100 mg via INTRAVENOUS

## 2022-07-15 MED ORDER — OXYCODONE HCL 5 MG PO TABS
5.0000 mg | ORAL_TABLET | Freq: Once | ORAL | Status: AC | PRN
  Administered 2022-07-15: 5 mg via ORAL

## 2022-07-15 MED ORDER — FENTANYL CITRATE (PF) 250 MCG/5ML IJ SOLN
INTRAMUSCULAR | Status: DC | PRN
  Administered 2022-07-15: 100 ug via INTRAVENOUS
  Administered 2022-07-15 (×3): 50 ug via INTRAVENOUS

## 2022-07-15 MED ORDER — CHLORHEXIDINE GLUCONATE 0.12 % MT SOLN: 15.0000 mL | Freq: Once | OROMUCOSAL | Status: DC

## 2022-07-15 MED ORDER — OXYCODONE HCL 5 MG PO TABS
ORAL_TABLET | ORAL | Status: AC
  Filled 2022-07-15: qty 1

## 2022-07-15 MED ORDER — SUGAMMADEX SODIUM 200 MG/2ML IV SOLN
INTRAVENOUS | Status: DC | PRN
  Administered 2022-07-15: 200 mg via INTRAVENOUS

## 2022-07-15 MED ORDER — 0.9 % SODIUM CHLORIDE (POUR BTL) OPTIME
TOPICAL | Status: DC | PRN
  Administered 2022-07-15: 1000 mL

## 2022-07-15 MED ORDER — AMISULPRIDE (ANTIEMETIC) 5 MG/2ML IV SOLN
INTRAVENOUS | Status: AC
  Filled 2022-07-15: qty 4

## 2022-07-15 MED ORDER — HYDROMORPHONE HCL 1 MG/ML IJ SOLN: 0.5000 mg | INTRAMUSCULAR | Status: DC | PRN

## 2022-07-15 MED ORDER — AMISULPRIDE (ANTIEMETIC) 5 MG/2ML IV SOLN
10.0000 mg | Freq: Once | INTRAVENOUS | Status: AC | PRN
  Administered 2022-07-15: 10 mg via INTRAVENOUS

## 2022-07-15 MED ORDER — FENTANYL CITRATE (PF) 100 MCG/2ML IJ SOLN
25.0000 ug | INTRAMUSCULAR | Status: DC | PRN
  Administered 2022-07-15 (×2): 25 ug via INTRAVENOUS
  Administered 2022-07-15: 50 ug via INTRAVENOUS

## 2022-07-15 MED ORDER — CEFAZOLIN SODIUM-DEXTROSE 2-4 GM/100ML-% IV SOLN
2.0000 g | INTRAVENOUS | Status: AC
  Administered 2022-07-15: 2 g via INTRAVENOUS

## 2022-07-15 MED ORDER — MIDAZOLAM HCL 2 MG/2ML IJ SOLN
INTRAMUSCULAR | Status: AC
  Filled 2022-07-15: qty 2

## 2022-07-15 MED ORDER — FENTANYL CITRATE (PF) 100 MCG/2ML IJ SOLN
INTRAMUSCULAR | Status: AC
  Filled 2022-07-15: qty 2

## 2022-07-15 MED ORDER — OXYCODONE-ACETAMINOPHEN 5-325 MG PO TABS: 1.0000 | ORAL_TABLET | Freq: Four times a day (QID) | ORAL | 0 refills | Status: DC | PRN

## 2022-07-15 MED ORDER — OXYCODONE HCL 5 MG/5ML PO SOLN: 5.0000 mg | Freq: Once | ORAL | Status: AC | PRN

## 2022-07-15 MED ORDER — ACETAMINOPHEN 500 MG PO TABS: 1000.0000 mg | ORAL_TABLET | Freq: Once | ORAL | Status: AC

## 2022-07-15 MED ORDER — ACETAMINOPHEN 500 MG PO TABS
ORAL_TABLET | ORAL | Status: AC
  Administered 2022-07-15: 1000 mg via ORAL
  Filled 2022-07-15: qty 2

## 2022-07-15 MED ORDER — PHENYLEPHRINE 80 MCG/ML (10ML) SYRINGE FOR IV PUSH (FOR BLOOD PRESSURE SUPPORT)
PREFILLED_SYRINGE | INTRAVENOUS | Status: DC | PRN
  Administered 2022-07-15: 80 ug via INTRAVENOUS

## 2022-07-15 MED ORDER — KETOROLAC TROMETHAMINE 30 MG/ML IJ SOLN
INTRAMUSCULAR | Status: DC | PRN
  Administered 2022-07-15: 30 mg via INTRAVENOUS

## 2022-07-15 MED ORDER — ORAL CARE MOUTH RINSE: 15.0000 mL | Freq: Once | OROMUCOSAL | Status: DC

## 2022-07-15 MED ORDER — CIPROFLOXACIN HCL 500 MG PO TABS: 500.0000 mg | ORAL_TABLET | Freq: Two times a day (BID) | ORAL | 0 refills | Status: DC

## 2022-07-15 MED ORDER — LIDOCAINE 2% (20 MG/ML) 5 ML SYRINGE
INTRAMUSCULAR | Status: AC
  Filled 2022-07-15: qty 5

## 2022-07-15 MED ORDER — MIDAZOLAM HCL 2 MG/2ML IJ SOLN
INTRAMUSCULAR | Status: DC | PRN
  Administered 2022-07-15: 2 mg via INTRAVENOUS

## 2022-07-15 MED ORDER — CHLORHEXIDINE GLUCONATE 0.12 % MT SOLN
OROMUCOSAL | Status: AC
  Administered 2022-07-15: 15 mL
  Filled 2022-07-15: qty 15

## 2022-07-15 MED ORDER — PROPOFOL 10 MG/ML IV BOLUS
INTRAVENOUS | Status: DC | PRN
  Administered 2022-07-15: 200 mg via INTRAVENOUS

## 2022-07-15 MED ORDER — METOCLOPRAMIDE HCL 5 MG/ML IJ SOLN: 10.0000 mg | Freq: Once | INTRAMUSCULAR | Status: DC

## 2022-07-15 MED ORDER — ONDANSETRON HCL 4 MG/2ML IJ SOLN
INTRAMUSCULAR | Status: DC | PRN
  Administered 2022-07-15: 4 mg via INTRAVENOUS

## 2022-07-15 MED ORDER — FENTANYL CITRATE (PF) 250 MCG/5ML IJ SOLN
INTRAMUSCULAR | Status: AC
  Filled 2022-07-15: qty 5

## 2022-07-15 MED ORDER — ONDANSETRON HCL 4 MG/2ML IJ SOLN: 4.0000 mg | Freq: Once | INTRAMUSCULAR | Status: DC | PRN

## 2022-07-15 MED ORDER — PROPOFOL 10 MG/ML IV BOLUS
INTRAVENOUS | Status: AC
  Filled 2022-07-15: qty 20

## 2022-07-15 MED ORDER — CEFAZOLIN SODIUM-DEXTROSE 2-4 GM/100ML-% IV SOLN
INTRAVENOUS | Status: AC
  Filled 2022-07-15: qty 100

## 2022-07-15 MED ORDER — BUPIVACAINE-EPINEPHRINE 0.5% -1:200000 IJ SOLN
INTRAMUSCULAR | Status: DC | PRN
  Administered 2022-07-15: 20 mL

## 2022-07-15 MED ORDER — ROCURONIUM BROMIDE 10 MG/ML (PF) SYRINGE
PREFILLED_SYRINGE | INTRAVENOUS | Status: DC | PRN
  Administered 2022-07-15: 70 mg via INTRAVENOUS
  Administered 2022-07-15: 30 mg via INTRAVENOUS

## 2022-07-15 SURGICAL SUPPLY — 21 items
CANISTER SUCT 3000ML PPV (MISCELLANEOUS) ×2 IMPLANT
DRAPE STERI URO 9X17 APER PCH (DRAPES) ×2 IMPLANT
GAUZE PACKING 2X5 YD STRL (GAUZE/BANDAGES/DRESSINGS) IMPLANT
GLOVE BIOGEL PI IND STRL 8 (GLOVE) ×2 IMPLANT
GLOVE ECLIPSE 8.0 STRL XLNG CF (GLOVE) ×2 IMPLANT
GLOVE SURG UNDER POLY LF SZ7 (GLOVE) ×2 IMPLANT
GOWN STRL REUS W/ TWL LRG LVL3 (GOWN DISPOSABLE) ×8 IMPLANT
GOWN STRL REUS W/TWL LRG LVL3 (GOWN DISPOSABLE) ×4
KIT TURNOVER KIT B (KITS) ×2 IMPLANT
NS IRRIG 1000ML POUR BTL (IV SOLUTION) ×2 IMPLANT
PACK VAGINAL MINOR WOMEN LF (CUSTOM PROCEDURE TRAY) IMPLANT
PACK VAGINAL WOMENS (CUSTOM PROCEDURE TRAY) ×2 IMPLANT
PAD OB MATERNITY 4.3X12.25 (PERSONAL CARE ITEMS) ×2 IMPLANT
SPECIMEN JAR MEDIUM (MISCELLANEOUS) IMPLANT
SUT VIC AB 0 CT1 18XCR BRD8 (SUTURE) ×6 IMPLANT
SUT VIC AB 0 CT1 27 (SUTURE)
SUT VIC AB 0 CT1 27XCR 8 STRN (SUTURE) IMPLANT
SUT VIC AB 0 CT1 8-18 (SUTURE) ×3
TOWEL GREEN STERILE FF (TOWEL DISPOSABLE) ×4 IMPLANT
TRAY FOLEY W/BAG SLVR 14FR (SET/KITS/TRAYS/PACK) ×2 IMPLANT
UNDERPAD 30X36 HEAVY ABSORB (UNDERPADS AND DIAPERS) ×2 IMPLANT

## 2022-07-15 NOTE — Op Note (Signed)
Preoperative diagnosis:  1.  menorrhagia                                         2.  dysmenorrhea                                         3.  dyspareunia                                         4.  Unresponsive to conservative measures  Postoperative diagnosis:  Same as above   Procedure:  Vaginal hysterectomy  Surgeon:  Florian Buff MD  Anesthesia:  General Endotracheal  Findings:  normal uterus tubes and ovaries  Intraoperative findings were normal  Description of operation:  The patient was taken from the preoperative area to the operating room in stable condition. She was placed in the sitting position and underwent a spinal anesthetic. Once an adequate level of anesthesia was attained she was placed in the dorsal lithotomy position. Patient was prepped and draped in the usual sterile fashion and a Foley catheter was placed.  A weighted speculum was placed and the cervix was grasped with thyroid tenaculums both anteriorly and posteriorly.  0.5% Marcaine plain was injected in a circumferential fashion about the cervix and the electrocautery unit was used to incise the vagina and push at all cervix.  The posterior cul-de-sac was then entered sharply without difficulty.  The uterosacral ligaments were clamped cut and inspection suture ligated and held.  The cardinal ligaments were then clamped cut transfixion suture ligated and cut. The anterior peritoneum was identified the anterior cul-de-sac was entered sharply without difficulty. The anterior and posterior leaves of the broad ligament were plicated and the uterine vessels were clamped cut and suture ligated. Serial pedicles were taken of the fundus with each pedicle being clamped cut and suture ligated. The utero-ovarian ligaments were crossclamped the uterus was removed and both pedicles were transfixion suture ligated. There was good hemostasis of all the pedicles. The peritoneum was then closed in a pursestring fashion using 3-0 Vicryl. The  anterior posterior vagina was closed in interrupted fashion with good resultant hemostasis. Our closure the lower pelvis and vagina were irrigated vigorously.  The sponge needle and instrument counts were correct x 3.  Total blood loss for the procedure was 125 cc.  The patient received 2 g of Ancef and 30 mg of Toradol IV preoperatively prophylactically.  She was taken to the recovery room in good stable condition awake alert doing well.  Florian Buff 07/15/2022, 3:21 PM

## 2022-07-15 NOTE — Anesthesia Postprocedure Evaluation (Signed)
Anesthesia Post Note  Patient: Meghan Reed  Procedure(s) Performed: HYSTERECTOMY VAGINAL WITH SALPINGECTOMY (Bilateral: Vagina )     Patient location during evaluation: PACU Anesthesia Type: General Level of consciousness: awake and alert Pain management: pain level controlled Vital Signs Assessment: post-procedure vital signs reviewed and stable Respiratory status: spontaneous breathing, nonlabored ventilation and respiratory function stable Cardiovascular status: blood pressure returned to baseline and stable Postop Assessment: no apparent nausea or vomiting Anesthetic complications: no   No notable events documented.  Last Vitals:  Vitals:   07/15/22 1530 07/15/22 1545  BP: 121/80 119/88  Pulse: 87 91  Resp: 15 11  Temp:    SpO2: 99% 99%    Last Pain:  Vitals:   07/15/22 1545  PainSc: 6                  Arriyana Rodell E Tesa Meadors

## 2022-07-15 NOTE — Transfer of Care (Signed)
Immediate Anesthesia Transfer of Care Note  Patient: Meghan Reed  Procedure(s) Performed: HYSTERECTOMY VAGINAL WITH SALPINGECTOMY (Bilateral: Vagina )  Patient Location: PACU  Anesthesia Type:General  Level of Consciousness: awake, alert  and oriented  Airway & Oxygen Therapy: Patient Spontanous Breathing and Patient connected to face mask oxygen  Post-op Assessment: Report given to RN and Post -op Vital signs reviewed and stable  Post vital signs: Reviewed and stable  Last Vitals:  Vitals Value Taken Time  BP 130/71 07/15/22 1519  Temp    Pulse 98 07/15/22 1521  Resp    SpO2 100 % 07/15/22 1521  Vitals shown include unvalidated device data.  Last Pain:  Vitals:   07/15/22 1041  PainSc: 3          Complications: No notable events documented.

## 2022-07-15 NOTE — Anesthesia Procedure Notes (Signed)
Procedure Name: Intubation Date/Time: 07/15/2022 1:14 PM  Performed by: Lance Coon, CRNAPre-anesthesia Checklist: Patient identified, Emergency Drugs available, Suction available, Patient being monitored and Timeout performed Patient Re-evaluated:Patient Re-evaluated prior to induction Oxygen Delivery Method: Circle system utilized Preoxygenation: Pre-oxygenation with 100% oxygen Induction Type: IV induction Ventilation: Mask ventilation without difficulty Laryngoscope Size: Miller and 3 Grade View: Grade I Tube type: Oral Tube size: 7.0 mm Number of attempts: 1 Airway Equipment and Method: Stylet Placement Confirmation: ETT inserted through vocal cords under direct vision, positive ETCO2 and breath sounds checked- equal and bilateral Secured at: 21 cm Tube secured with: Tape Dental Injury: Teeth and Oropharynx as per pre-operative assessment
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
## 2022-07-16 ENCOUNTER — Encounter (HOSPITAL_COMMUNITY): Payer: Self-pay | Admitting: Obstetrics & Gynecology

## 2022-07-18 LAB — SURGICAL PATHOLOGY

## 2022-07-21 ENCOUNTER — Other Ambulatory Visit (HOSPITAL_COMMUNITY): Payer: Self-pay

## 2022-07-25 ENCOUNTER — Telehealth (INDEPENDENT_AMBULATORY_CARE_PROVIDER_SITE_OTHER): Payer: Medicaid Other | Admitting: Obstetrics & Gynecology

## 2022-07-25 ENCOUNTER — Encounter: Payer: Self-pay | Admitting: Obstetrics & Gynecology

## 2022-07-25 DIAGNOSIS — Z9889 Other specified postprocedural states: Secondary | ICD-10-CM

## 2022-07-25 NOTE — Progress Notes (Signed)
My Chart video visit Pt is at home and I am in my office Time: 5 minutes HPI: Patient returns for routine postoperative follow-up having undergone TVH on 07/15/22 [facility].  The patient's immediate postoperative recovery has been unremarkable. Since hospital discharge the patient reports no problems.   Current Outpatient Medications: albuterol (PROVENTIL) (2.5 MG/3ML) 0.083% nebulizer solution, Take 3 mLs (2.5 mg total) by nebulization every 6 (six) hours as needed for wheezing or shortness of breath., Disp: 150 mL, Rfl: 1 albuterol (VENTOLIN HFA) 108 (90 Base) MCG/ACT inhaler, Inhale 2 puffs into the lungs every 4 (four) hours as needed for wheezing or shortness of breath., Disp: 1 each, Rfl: 0 ciprofloxacin (CIPRO) 500 MG tablet, Take 1 tablet (500 mg total) by mouth 2 (two) times daily., Disp: 14 tablet, Rfl: 0 cyclobenzaprine (FLEXERIL) 10 MG tablet, Take 1 tablet (10 mg total) by mouth at bedtime as needed for muscle spasms., Disp: 60 tablet, Rfl: 0 fluticasone (FLOVENT HFA) 110 MCG/ACT inhaler, Inhale 2 puffs into the lungs 2 (two) times daily. (Patient taking differently: Inhale 2 puffs into the lungs 2 (two) times daily as needed (wheezing, shortness of breath).), Disp: 1 each, Rfl: 5 Galcanezumab-gnlm (EMGALITY) 120 MG/ML SOAJ, Inject 120 mg into the skin every 30 days., Disp: 3 mL, Rfl: 3 omeprazole (PRILOSEC OTC) 20 MG tablet, Take 1 tablet (20 mg total) by mouth daily. (Patient taking differently: Take 20 mg by mouth daily as needed (acid reflux).), Disp: 30 tablet, Rfl: 3 ondansetron (ZOFRAN-ODT) 8 MG disintegrating tablet, Take 0.5-1 tablets (4-8 mg total) by mouth every 8 (eight) hours as needed., Disp: 30 tablet, Rfl: 6 ondansetron (ZOFRAN-ODT) 8 MG disintegrating tablet, Take 1 tablet (8 mg total) by mouth every 8 (eight) hours as needed for nausea or vomiting., Disp: 8 tablet, Rfl: 0 oxyCODONE-acetaminophen (PERCOCET) 5-325 MG tablet, Take 1-2 tablets by mouth every 6 (six) hours as  needed for severe pain., Disp: 30 tablet, Rfl: 0 Ubrogepant (UBRELVY) 100 MG TABS, Take 1 tablet by mouth daily as needed. (Patient taking differently: Take 1 tablet by mouth daily as needed (migraine).), Disp: 8 tablet, Rfl: 11  No current facility-administered medications for this visit.    Last menstrual period 06/22/2022.  Physical Exam: Gen WDWN NAD  Diagnostic Tests:   Pathology: benign  Impression + Management plan: (E08.144) Post-operative state : TVH 07/15/22  (primary encounter diagnosis)     Medications Prescribed this encounter: No orders of the defined types were placed in this encounter.     Follow up: Return for keep scheduled.     Florian Buff, MD Attending Physician for the [facility] 07/25/2022 10:45 AM

## 2022-07-26 ENCOUNTER — Encounter: Payer: Self-pay | Admitting: Obstetrics & Gynecology

## 2022-07-27 MED ORDER — CYCLOBENZAPRINE HCL 10 MG PO TABS: 10.0000 mg | ORAL_TABLET | Freq: Three times a day (TID) | ORAL | 0 refills | Status: DC | PRN

## 2022-07-27 MED ORDER — OXYCODONE-ACETAMINOPHEN 5-325 MG PO TABS: 1.0000 | ORAL_TABLET | Freq: Four times a day (QID) | ORAL | 0 refills | Status: DC | PRN

## 2022-08-04 ENCOUNTER — Encounter: Payer: Self-pay | Admitting: Obstetrics & Gynecology

## 2022-08-23 ENCOUNTER — Ambulatory Visit (INDEPENDENT_AMBULATORY_CARE_PROVIDER_SITE_OTHER): Payer: Medicaid Other | Admitting: Student

## 2022-08-23 ENCOUNTER — Encounter: Payer: Self-pay | Admitting: Student

## 2022-08-23 VITALS — BP 115/80 | HR 96 | Temp 97.7°F | Ht 66.0 in | Wt 210.0 lb

## 2022-08-23 DIAGNOSIS — Z0000 Encounter for general adult medical examination without abnormal findings: Secondary | ICD-10-CM

## 2022-08-23 DIAGNOSIS — K1120 Sialoadenitis, unspecified: Secondary | ICD-10-CM

## 2022-08-23 DIAGNOSIS — Z23 Encounter for immunization: Secondary | ICD-10-CM

## 2022-08-23 NOTE — Assessment & Plan Note (Signed)
Reassuringly afebrile and without evidence of bacterial infection. No stone palpable on exam. Suspect this is either 2/2 a small stone I cannot palpate vs a viral source that should be self-resolving. Will treat supportively. Recommend lemon candy to flush gland. Return precautions reviewed for fever, pus, airway compromise. Also cannot rule out odontogenic source, advised follow-up with a dentist if symptoms do not resolve over the next few weeks.

## 2022-08-23 NOTE — Progress Notes (Signed)
    SUBJECTIVE:   Chief compliant/HPI: annual examination  Meghan Reed is a 36 y.o. who presents today for an annual exam.   Review of systems form notable for submandibular pain on the left side. Present for several days to perhaps a week. Otherwise asymptomatic, no URI symptoms or fever. Of note, was intubated ~5 weeks ago for a vaginal hysterectomy.  Finds this more annoying than truly painful or distressing.   Updated history tabs and problem list.   OBJECTIVE:   BP 115/80   Pulse 96   Temp 97.7 F (36.5 C)   Ht $R'5\' 6"'qV$  (1.676 m)   Wt 210 lb (95.3 kg)   LMP  (LMP Unknown)   SpO2 97%   BMI 33.89 kg/m   Physical Exam Vitals reviewed.  Constitutional:      General: She is not in acute distress. HENT:     Mouth/Throat:     Comments: Symmetric submandibular glands with tenderness to internal, external, and bimanual palpation on left. R side unaffected. Good dentition with history of dental work evident. No purulence or obvious obstruction of duct. No palpable stone.  Oropharynx is clear. Neck:     Comments: Supple, symmetric, and without lymphadenopathy Cardiovascular:     Rate and Rhythm: Normal rate and regular rhythm.     Heart sounds: No murmur heard.    No friction rub. No gallop.  Pulmonary:     Effort: Pulmonary effort is normal. No respiratory distress.     Breath sounds: Normal breath sounds. No wheezing or rales.  Skin:    General: Skin is warm and dry.  Psychiatric:        Mood and Affect: Mood normal.      ASSESSMENT/PLAN:   Sialadenitis Reassuringly afebrile and without evidence of bacterial infection. No stone palpable on exam. Suspect this is either 2/2 a small stone I cannot palpate vs a viral source that should be self-resolving. Will treat supportively. Recommend lemon candy to flush gland. Return precautions reviewed for fever, pus, airway compromise. Also cannot rule out odontogenic source, advised follow-up with a dentist if  symptoms do not resolve over the next few weeks.     Annual Examination  See AVS for age appropriate recommendations.   PHQ score 0, reviewed and discussed. Blood pressure reviewed and at goal yes.  The patient currently uses s/p hysterectomy for contraception.    Considered the following items based upon USPSTF recommendations: HIV testing:  up to date Hepatitis C:  up to date Hepatitis B:  up to date Syphilis if at high risk:  no sex since last STI screen GC/CT  No sex since last STI screen Lipid panel (nonfasting or fasting) discussed based upon AHA recommendations and not ordered.  Consider repeat every 4-6 years.  Reviewed risk factors for latent tuberculosis and not indicated  Discussed family history, BRCA testing not indicated.  Immunizations: flu vaccine today  Follow up in 1 year or sooner if indicated.    Pearla Dubonnet, MD [place]

## 2022-08-23 NOTE — Patient Instructions (Signed)
Meghan Reed,  It is such a joy to meet you, I think you have some inflammation of your salivary gland. I do not see any evidence of active bacterial infection. It's possible you have a small stone in there or are getting over a viral infection of the area. Either way, I anticipate this will get better on it's own. If you start having fevers or notice pus in your mouth, please come back to see [place]. If you keep having this pesky discomfort for the next few days/weeks, it may be wise to see a dentist as this may be related to your teeth and not just your saliva gland.  Be well, Pearla Dubonnet, MD

## 2022-09-09 ENCOUNTER — Ambulatory Visit (INDEPENDENT_AMBULATORY_CARE_PROVIDER_SITE_OTHER): Payer: Medicaid Other | Admitting: Obstetrics & Gynecology

## 2022-09-09 ENCOUNTER — Encounter: Payer: Self-pay | Admitting: Obstetrics & Gynecology

## 2022-09-09 VITALS — BP 112/79 | HR 83 | Ht 66.0 in | Wt 210.0 lb

## 2022-09-09 DIAGNOSIS — Z9889 Other specified postprocedural states: Secondary | ICD-10-CM

## 2022-09-09 DIAGNOSIS — N3281 Overactive bladder: Secondary | ICD-10-CM

## 2022-09-09 MED ORDER — SOLIFENACIN SUCCINATE 10 MG PO TABS: 10.0000 mg | ORAL_TABLET | Freq: Every day | ORAL | 11 refills | Status: DC

## 2022-09-09 NOTE — Progress Notes (Signed)
  HPI: Patient returns for routine postoperative follow-up having undergone TVH  on 07/15/22.  The patient's immediate postoperative recovery has been unremarkable. Since hospital discharge the patient reports OAB symptoms.   Current Outpatient Medications: albuterol (PROVENTIL) (2.5 MG/3ML) 0.083% nebulizer solution, Take 3 mLs (2.5 mg total) by nebulization every 6 (six) hours as needed for wheezing or shortness of breath., Disp: 150 mL, Rfl: 1 albuterol (VENTOLIN HFA) 108 (90 Base) MCG/ACT inhaler, Inhale 2 puffs into the lungs every 4 (four) hours as needed for wheezing or shortness of breath., Disp: 1 each, Rfl: 0 cyclobenzaprine (FLEXERIL) 10 MG tablet, Take 1 tablet (10 mg total) by mouth every 8 (eight) hours as needed for muscle spasms., Disp: 15 tablet, Rfl: 0 fluticasone (FLOVENT HFA) 110 MCG/ACT inhaler, Inhale 2 puffs into the lungs 2 (two) times daily. (Patient taking differently: Inhale 2 puffs into the lungs 2 (two) times daily as needed (wheezing, shortness of breath).), Disp: 1 each, Rfl: 5 Galcanezumab-gnlm (EMGALITY) 120 MG/ML SOAJ, Inject 120 mg into the skin every 30 days., Disp: 3 mL, Rfl: 3 omeprazole (PRILOSEC OTC) 20 MG tablet, Take 1 tablet (20 mg total) by mouth daily. (Patient taking differently: Take 20 mg by mouth daily as needed (acid reflux).), Disp: 30 tablet, Rfl: 3 Ubrogepant (UBRELVY) 100 MG TABS, Take 1 tablet by mouth daily as needed. (Patient taking differently: Take 1 tablet by mouth daily as needed (migraine).), Disp: 8 tablet, Rfl: 11  No current facility-administered medications for this visit.    Blood pressure 112/79, pulse 83, height '5\' 6"'$  (1.676 m), weight 210 lb (95.3 kg).  Physical Exam: Normal cuff healing still with suture material in the vagina Cuff normal post op  Diagnostic Tests:   Pathology: benign  Impression + Management plan:   ICD-10-CM   1. Post-operative state : [facility] 07/15/22  Z98.890     2. OAB (overactive bladder)   N32.81    vesicare 10 qhs         Medications Prescribed this encounter: Meds ordered this encounter  Medications   solifenacin (VESICARE) 10 MG tablet    Sig: Take 1 tablet (10 mg total) by mouth daily.    Dispense:  30 tablet    Refill:  11        Follow up: Prn/1 year   Florian Buff, MD Attending Physician for the [facility] 09/09/2022 11:57 AM

## 2022-09-20 ENCOUNTER — Other Ambulatory Visit (HOSPITAL_COMMUNITY): Payer: Self-pay

## 2022-09-20 ENCOUNTER — Ambulatory Visit (INDEPENDENT_AMBULATORY_CARE_PROVIDER_SITE_OTHER): Payer: Medicaid Other | Admitting: Internal Medicine

## 2022-09-20 ENCOUNTER — Encounter: Payer: Self-pay | Admitting: Internal Medicine

## 2022-09-20 ENCOUNTER — Other Ambulatory Visit: Payer: Self-pay | Admitting: Internal Medicine

## 2022-09-20 VITALS — BP 96/52 | HR 108 | Temp 99.0°F

## 2022-09-20 DIAGNOSIS — R052 Subacute cough: Secondary | ICD-10-CM | POA: Diagnosis not present

## 2022-09-20 DIAGNOSIS — R0609 Other forms of dyspnea: Secondary | ICD-10-CM | POA: Diagnosis not present

## 2022-09-20 LAB — COMPREHENSIVE METABOLIC PANEL
ALT: 16 U/L (ref 0–35)
AST: 17 U/L (ref 0–37)
Albumin: 4.3 g/dL (ref 3.5–5.2)
Alkaline Phosphatase: 46 U/L (ref 39–117)
BUN: 7 mg/dL (ref 6–23)
CO2: 29 mEq/L (ref 19–32)
Calcium: 9.2 mg/dL (ref 8.4–10.5)
Chloride: 103 mEq/L (ref 96–112)
Creatinine, Ser: 0.83 mg/dL (ref 0.40–1.20)
GFR: 90.07 mL/min (ref >60.00)
Glucose, Bld: 92 mg/dL (ref 70–99)
Potassium: 3.4 mEq/L — ABNORMAL LOW (ref 3.5–5.1)
Sodium: 138 mEq/L (ref 135–145)
Total Bilirubin: 0.3 mg/dL (ref 0.2–1.2)
Total Protein: 7 g/dL (ref 6.0–8.3)

## 2022-09-20 LAB — CBC WITH DIFFERENTIAL/PLATELET
Basophils Absolute: 0 10*3/uL (ref 0.0–0.1)
Basophils Relative: 0.6 % (ref 0.0–3.0)
Eosinophils Absolute: 0.2 10*3/uL (ref 0.0–0.7)
Eosinophils Relative: 2.7 % (ref 0.0–5.0)
HCT: 37 % (ref 36.0–46.0)
Hemoglobin: 12.8 g/dL (ref 12.0–15.0)
Lymphocytes Relative: 27.2 % (ref 12.0–46.0)
Lymphs Abs: 2.1 10*3/uL (ref 0.7–4.0)
MCHC: 34.5 g/dL (ref 30.0–36.0)
MCV: 88.7 fl (ref 78.0–100.0)
Monocytes Absolute: 0.4 10*3/uL (ref 0.1–1.0)
Monocytes Relative: 5.9 % (ref 3.0–12.0)
Neutro Abs: 4.9 10*3/uL (ref 1.4–7.7)
Neutrophils Relative %: 63.6 % (ref 43.0–77.0)
Platelets: 267 10*3/uL (ref 150.0–400.0)
RBC: 4.17 Mil/uL (ref 3.87–5.11)
RDW: 13.2 % (ref 11.5–15.5)
WBC: 7.7 10*3/uL (ref 4.0–10.5)

## 2022-09-20 LAB — D-DIMER, QUANTITATIVE: D-Dimer, Quant: 0.76 mcg/mL FEU — ABNORMAL HIGH (ref <0.50)

## 2022-09-20 MED ORDER — TRAMADOL HCL 50 MG PO TABS
50.0000 mg | ORAL_TABLET | Freq: Four times a day (QID) | ORAL | 1 refills | Status: DC | PRN
  Filled 2022-09-20: qty 40, 5d supply, fill #0

## 2022-09-20 MED ORDER — POTASSIUM CHLORIDE CRYS ER 20 MEQ PO TBCR
20.0000 meq | EXTENDED_RELEASE_TABLET | Freq: Every day | ORAL | 0 refills | Status: DC
  Filled 2022-09-20: qty 10, 10d supply, fill #0

## 2022-09-20 NOTE — Assessment & Plan Note (Addendum)
D dimer STAT Start Xarelto 15 mg bid Consider CT angio CBC Start Xarelto 15 mg twice a day

## 2022-09-20 NOTE — Patient Instructions (Signed)
Xarelto 15 mg twice a day

## 2022-09-20 NOTE — Assessment & Plan Note (Signed)
D dimer STAT Start Xarelto 15 mg bid Consider CT angio CBC Tramadol prn

## 2022-09-20 NOTE — Progress Notes (Signed)
Subjective:  Patient ID: Meghan Reed, female    DOB: 1986/04/30  Age: 36 y.o. MRN: [id number]  CC: Cough (Dry cough since surgery w/ some SOB)   HPI Meghan Reed presents for SOB, cough since Sept 16-17th, 2023 S/p partial hysterectomy - Sept 15 2023 C/o swollen legs after surgery Not better; worse No CP C/o tightness, HR 100s at home   H/o R lung PE in 2020 (8 months pregnant at the time)   Outpatient Medications Prior to Visit  Medication Sig Dispense Refill   albuterol (PROVENTIL) (2.5 MG/3ML) 0.083% nebulizer solution Take 3 mLs (2.5 mg total) by nebulization every 6 (six) hours as needed for wheezing or shortness of breath. 150 mL 1   albuterol (VENTOLIN HFA) 108 (90 Base) MCG/ACT inhaler Inhale 2 puffs into the lungs every 4 (four) hours as needed for wheezing or shortness of breath. 1 each 0   cyclobenzaprine (FLEXERIL) 10 MG tablet Take 1 tablet (10 mg total) by mouth every 8 (eight) hours as needed for muscle spasms. 15 tablet 0   fluticasone (FLOVENT HFA) 110 MCG/ACT inhaler Inhale 2 puffs into the lungs 2 (two) times daily. (Patient taking differently: Inhale 2 puffs into the lungs 2 (two) times daily as needed (wheezing, shortness of breath).) 1 each 5   Galcanezumab-gnlm (EMGALITY) 120 MG/ML SOAJ Inject 120 mg into the skin every 30 days. 3 mL 3   omeprazole (PRILOSEC OTC) 20 MG tablet Take 1 tablet (20 mg total) by mouth daily. (Patient taking differently: Take 20 mg by mouth daily as needed (acid reflux).) 30 tablet 3   solifenacin (VESICARE) 10 MG tablet Take 1 tablet (10 mg total) by mouth daily. 30 tablet 11   Ubrogepant (UBRELVY) 100 MG TABS Take 1 tablet by mouth daily as needed. (Patient taking differently: Take 1 tablet by mouth daily as needed (migraine).) 8 tablet 11   No facility-administered medications prior to visit.    ROS: Review of Systems  Constitutional:  Positive for fatigue. Negative for activity change,  appetite change, chills and unexpected weight change.  HENT:  Negative for congestion, mouth sores and sinus pressure.   Eyes:  Negative for visual disturbance.  Respiratory:  Positive for cough, chest tightness, shortness of breath and wheezing.   Cardiovascular:  Positive for leg swelling.  Gastrointestinal:  Negative for abdominal pain and nausea.  Genitourinary:  Negative for difficulty urinating, frequency and vaginal pain.  Musculoskeletal:  Negative for back pain and gait problem.  Skin:  Negative for pallor and rash.  Neurological:  Negative for dizziness, tremors, weakness, numbness and headaches.  Psychiatric/Behavioral:  Negative for confusion and sleep disturbance.     Objective:  BP (!) 96/52 (BP Location: Left Arm)   Pulse (!) 108   Temp 99 F (37.2 C) (Oral)   LMP  (LMP Unknown)   SpO2 98%   BP Readings from Last 3 Encounters:  09/20/22 (!) 96/52  09/09/22 112/79  08/23/22 115/80    Wt Readings from Last 3 Encounters:  09/09/22 210 lb (95.3 kg)  08/23/22 210 lb (95.3 kg)  07/15/22 210 lb (95.3 kg)    Physical Exam Constitutional:      General: She is not in acute distress.    Appearance: She is well-developed.  HENT:     Head: Normocephalic.     Right Ear: External ear normal.     Left Ear: External ear normal.     Nose: Nose normal.  Eyes:  General:        Right eye: No discharge.        Left eye: No discharge.     Conjunctiva/sclera: Conjunctivae normal.     Pupils: Pupils are equal, round, and reactive to light.  Neck:     Thyroid: No thyromegaly.     Vascular: No JVD.     Trachea: No tracheal deviation.  Cardiovascular:     Rate and Rhythm: Normal rate and regular rhythm.     Heart sounds: Normal heart sounds.  Pulmonary:     Effort: No respiratory distress.     Breath sounds: No stridor. No wheezing.  Abdominal:     General: Bowel sounds are normal. There is no distension.     Palpations: Abdomen is soft. There is no mass.      Tenderness: There is no abdominal tenderness. There is no guarding or rebound.  Musculoskeletal:        General: No tenderness.     Cervical back: Normal range of motion and neck supple. No rigidity.     Right lower leg: Edema present.     Left lower leg: Edema present.  Lymphadenopathy:     Cervical: No cervical adenopathy.  Skin:    Findings: No erythema or rash.  Neurological:     Cranial Nerves: No cranial nerve deficit.     Motor: No abnormal muscle tone.     Coordination: Coordination normal.     Deep Tendon Reflexes: Reflexes normal.  Psychiatric:        Behavior: Behavior normal.        Thought Content: Thought content normal.        Judgment: Judgment normal.   Coughing a lot B trace edema. Calves NT B  Lab Results  Component Value Date   WBC 7.5 07/08/2022   HGB 13.5 07/08/2022   HCT 40.0 07/08/2022   PLT 206 07/08/2022   GLUCOSE 90 12/06/2021   CHOL 234 (H) 08/16/2021   TRIG 78 08/16/2021   HDL 55 08/16/2021   LDLCALC 165 (H) 08/16/2021   ALT 18 05/31/2021   AST 17 05/31/2021   NA 139 12/06/2021   K 3.9 12/06/2021   CL 103 12/06/2021   CREATININE 0.68 12/06/2021   BUN 10 12/06/2021   CO2 27 12/06/2021   TSH 0.909 12/21/2021   INR 1.5 (H) 10/31/2019   HGBA1C 5.2 08/12/2019    No results found.  Assessment & Plan:   Problem List Items Addressed This Visit     DOE (dyspnea on exertion) - Primary    D dimer STAT Start Xarelto 15 mg bid Consider CT angio CBC Start Xarelto 15 mg twice a day      Relevant Orders   CBC with Differential/Platelet   Comprehensive metabolic panel   D-dimer, quantitative   CT Angio Chest Pulmonary Embolism (PE) W or WO Contrast   Subacute cough    D dimer STAT Start Xarelto 15 mg bid Consider CT angio CBC Tramadol prn      Relevant Orders   CBC with Differential/Platelet   Comprehensive metabolic panel   D-dimer, quantitative   CT Angio Chest Pulmonary Embolism (PE) W or WO Contrast      Meds ordered  this encounter  Medications   traMADol (ULTRAM) 50 MG tablet    Sig: Take 1-2 tablets (50-100 mg total) by mouth every 6 (six) hours as needed (cough).    Dispense:  40 tablet    Refill:  1  Follow-up: Return in about 1 week (around 09/27/2022).  Walker Kehr, MD

## 2022-09-21 ENCOUNTER — Ambulatory Visit
Admission: RE | Admit: 2022-09-21 | Discharge: 2022-09-21 | Disposition: A | Discharge status: Home / Self Care | Payer: Medicaid Other | Source: Ambulatory Visit | Attending: Internal Medicine | Admitting: Internal Medicine

## 2022-09-21 ENCOUNTER — Other Ambulatory Visit (HOSPITAL_COMMUNITY): Payer: Self-pay

## 2022-09-21 DIAGNOSIS — R0609 Other forms of dyspnea: Secondary | ICD-10-CM

## 2022-09-21 DIAGNOSIS — R052 Subacute cough: Secondary | ICD-10-CM

## 2022-09-21 MED ORDER — IOPAMIDOL (ISOVUE-370) INJECTION 76%
75.0000 mL | Freq: Once | INTRAVENOUS | Status: AC | PRN
  Administered 2022-09-21: 75 mL via INTRAVENOUS

## 2022-09-22 ENCOUNTER — Emergency Department (HOSPITAL_BASED_OUTPATIENT_CLINIC_OR_DEPARTMENT_OTHER)
Admission: EM | Admit: 2022-09-22 | Discharge: 2022-09-22 | Disposition: A | Discharge status: Home / Self Care | Payer: Medicaid Other | Attending: Emergency Medicine | Admitting: Emergency Medicine

## 2022-09-22 ENCOUNTER — Emergency Department (HOSPITAL_BASED_OUTPATIENT_CLINIC_OR_DEPARTMENT_OTHER): Payer: Medicaid Other

## 2022-09-22 ENCOUNTER — Other Ambulatory Visit: Payer: Self-pay

## 2022-09-22 DIAGNOSIS — R091 Pleurisy: Secondary | ICD-10-CM | POA: Insufficient documentation

## 2022-09-22 DIAGNOSIS — Z7951 Long term (current) use of inhaled steroids: Secondary | ICD-10-CM | POA: Diagnosis not present

## 2022-09-22 DIAGNOSIS — Z1152 Encounter for screening for COVID-19: Secondary | ICD-10-CM | POA: Insufficient documentation

## 2022-09-22 DIAGNOSIS — J45909 Unspecified asthma, uncomplicated: Secondary | ICD-10-CM | POA: Insufficient documentation

## 2022-09-22 DIAGNOSIS — R059 Cough, unspecified: Secondary | ICD-10-CM | POA: Diagnosis present

## 2022-09-22 LAB — CBC
HCT: 37.5 % (ref 36.0–46.0)
Hemoglobin: 12.8 g/dL (ref 12.0–15.0)
MCH: 30.3 pg (ref 26.0–34.0)
MCHC: 34.1 g/dL (ref 30.0–36.0)
MCV: 88.7 fL (ref 80.0–100.0)
Platelets: 232 10*3/uL (ref 150–400)
RBC: 4.23 MIL/uL (ref 3.87–5.11)
RDW: 12.5 % (ref 11.5–15.5)
WBC: 7.1 10*3/uL (ref 4.0–10.5)
nRBC: 0 % (ref 0.0–0.2)

## 2022-09-22 LAB — RESP PANEL BY RT-PCR (FLU A&B, COVID) ARPGX2
Influenza A by PCR: NEGATIVE
Influenza B by PCR: NEGATIVE
SARS Coronavirus 2 by RT PCR: NEGATIVE

## 2022-09-22 LAB — BASIC METABOLIC PANEL
Anion gap: 7 (ref 5–15)
BUN: 10 mg/dL (ref 6–20)
CO2: 28 mmol/L (ref 22–32)
Calcium: 9.8 mg/dL (ref 8.9–10.3)
Chloride: 101 mmol/L (ref 98–111)
Creatinine, Ser: 0.89 mg/dL (ref 0.44–1.00)
GFR, Estimated: >60 mL/min (ref >60)
Glucose, Bld: 74 mg/dL (ref 70–99)
Potassium: 3.9 mmol/L (ref 3.5–5.1)
Sodium: 136 mmol/L (ref 135–145)

## 2022-09-22 LAB — D-DIMER, QUANTITATIVE: D-Dimer, Quant: 0.49 ug/mL-FEU (ref 0.00–0.50)

## 2022-09-22 LAB — TROPONIN I (HIGH SENSITIVITY): Troponin I (High Sensitivity): <2 ng/L (ref <18)

## 2022-09-22 MED ORDER — IOHEXOL 350 MG/ML SOLN
100.0000 mL | Freq: Once | INTRAVENOUS | Status: AC | PRN
  Administered 2022-09-22: 70 mL via INTRAVENOUS

## 2022-09-22 MED ORDER — HYDROMORPHONE HCL 1 MG/ML IJ SOLN
0.5000 mg | Freq: Once | INTRAMUSCULAR | Status: AC
  Administered 2022-09-22: 0.5 mg via INTRAVENOUS
  Filled 2022-09-22: qty 1

## 2022-09-22 NOTE — ED Triage Notes (Signed)
Pt presents with complaint of pain under left shoulder blade. Pt has been coughing for a week. Previously had elevated d-dimer with negative CT. Pt reports that this morning woke up with pain below shoulder, now difficult to take a deep breath.

## 2022-09-22 NOTE — Discharge Instructions (Addendum)
Thank you for coming to [facility]. You were seen for chest/back pain. We did an exam, labs, and imaging, and these showed no acute findings. I think you have either pleurisy, an inflammation of the lining of the lung, or a muscle spasm. You can alternate taking tylenol/ibuprofen for pain. You can also use over the counter lidocaine patches or heating pads if that helps your pain.  Please follow up with your primary care provider within 1 week.   Do not hesitate to return to the ED or call 911 if you experience: -Worsening symptoms -Lightheadedness, passing out -Fevers/chills -Anything else that concerns you

## 2022-09-22 NOTE — ED Notes (Signed)
RT note: RT assessment obtained.

## 2022-09-22 NOTE — ED Notes (Signed)
Patient transported to CT via wheelchair

## 2022-09-22 NOTE — ED Provider Notes (Signed)
[facility] Provider Note   CSN: [id number] Arrival date & time: 09/22/22  1429     History  Chief Complaint  Patient presents with   Shortness of Breath   Cough    Meghan Reed is a 36 y.o. female with asthma, IDA, spinal stenosis, PTSD, anxiety presents with cough, SOB.  Pain under L shoulder blade when she woke up this AM.  Stabbing, constant, 10/10. States she has a history of similar when she had a PE in her R lung during pregnancy in 2020. She denies SOB but states extreme pain with inspiration and it is difficult for her to take a deep breath. No pain in her chest but states the pain in her L shoulder blade is deep, like "inside." Denies trauma to the area.  States the pain originated in her left arm but has since moved into the left shoulder blade and her left arm does not hurt anymore.  She has had no fever/chills but has had a cough for about 3 weeks, is not coughing anything up and denies hemoptysis.  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  She took blood thinners as prescribed after the PE during pregnancy but does not take them anymore.  Of note patient is [id number] and was admitted in September 2023 for TVH/BSO for menorrhagia/dyspareunia.   Shortness of Breath Associated symptoms: cough   Cough Associated symptoms: shortness of breath        Home Medications Prior to Admission medications   Medication Sig Start Date End Date Taking? Authorizing Provider  potassium chloride SA (KLOR-CON M) 20 MEQ tablet Take 1 tablet (20 mEq total) by mouth daily. 09/20/22   Plotnikov, Evie Lacks, MD  albuterol (PROVENTIL) (2.5 MG/3ML) 0.083% nebulizer solution Take 3 mLs (2.5 mg total) by nebulization every 6 (six) hours as needed for  wheezing or shortness of breath. 10/15/21   Binnie Rail, MD  albuterol (VENTOLIN HFA) 108 (90 Base) MCG/ACT inhaler Inhale 2 puffs into the lungs every 4 (four) hours as needed for wheezing or shortness of breath. 11/10/20   Benay Pike, MD  cyclobenzaprine (FLEXERIL) 10 MG tablet Take 1 tablet (10 mg total) by mouth every 8 (eight) hours as needed for muscle spasms. 07/27/22   Florian Buff, MD  fluticasone (FLOVENT HFA) 110 MCG/ACT inhaler Inhale 2 puffs into the lungs 2 (two) times daily. Patient taking differently: Inhale 2 puffs into the lungs 2 (two) times daily as needed (wheezing, shortness of breath). 10/14/21   Burns, Claudina Lick, MD  Galcanezumab-gnlm (EMGALITY) 120 MG/ML SOAJ Inject 120 mg into the skin every 30 days. 05/17/22   Lomax, Amy, NP  omeprazole (PRILOSEC OTC) 20 MG tablet Take 1 tablet (20 mg total) by mouth daily. Patient taking differently: Take 20 mg by mouth daily as needed (acid reflux). 02/25/22   Autry-Lott, Naaman Plummer, DO  solifenacin (VESICARE) 10 MG tablet Take 1 tablet (10 mg total) by mouth daily. 09/09/22   Florian Buff, MD  traMADol (ULTRAM) 50 MG tablet Take 1-2 tablets (50-100 mg total) by mouth every 6 (six) hours as needed (cough). 09/20/22   Plotnikov, Evie Lacks, MD  Ubrogepant (UBRELVY) 100 MG TABS Take 1 tablet by mouth daily as needed. Patient taking  differently: Take 1 tablet by mouth daily as needed (migraine). 05/17/22   Lomax, Amy, NP      Allergies    Tomato, Aspirin, and Nsaids    Review of Systems   Review of Systems  Respiratory:  Positive for cough and shortness of breath.    Review of systems negative for fever/chills.  A 10 point review of systems was performed and is negative unless otherwise reported in HPI.  Physical Exam Updated Vital Signs BP 106/77   Pulse 73   Temp 98.4 F (36.9 C)   Resp 14   Ht '5\' 6"'$  (1.676 m)   Wt 95.3 kg   LMP  (LMP Unknown)   SpO2 98%   BMI 33.89 kg/m  Physical Exam General: Normal appearing female,  lying in bed.  HEENT: PERRLA, Sclera anicteric, MMM, trachea midline. Cardiology: RRR, no murmurs/rubs/gallops. BL radial and DP pulses equal bilaterally.  Resp: Normal respiratory rate and effort. CTAB, no wheezes, rhonchi, crackles.  Abd: Soft, non-tender, non-distended. No rebound tenderness or guarding.  GU: Deferred. MSK: No peripheral edema or signs of trauma. Extremities without deformity or TTP. No cyanosis or clubbing.  Full range of motion of the left arm, without any pain. Skin: warm, dry. No rashes or lesions. Back: No CVA tenderness.  No tenderness palpation over the left scapula, left paraspinal muscles. Neuro: A&Ox4, CNs II-XII grossly intact. MAEs. Sensation grossly intact.  Psych: Normal mood and affect.   ED Results / Procedures / Treatments   Labs (all labs ordered are listed, but only abnormal results are displayed) Labs Reviewed  RESP PANEL BY RT-PCR (FLU A&B, COVID) ARPGX2  BASIC METABOLIC PANEL  CBC  D-DIMER, QUANTITATIVE  TROPONIN I (HIGH SENSITIVITY)    EKG EKG Interpretation  Date/Time:  Thursday September 22 2022 15:40:59 EST Ventricular Rate:  76 PR Interval:  163 QRS Duration: 102 QT Interval:  379 QTC Calculation: 427 R Axis:   22 Text Interpretation: Sinus rhythm Borderline T abnormalities, anterior leads Confirmed by Cindee Lame [phone]) on 09/22/2022 3:56:02 PM  Radiology CT Angio Chest PE W and/or Wo Contrast  Result Date: 09/22/2022 CLINICAL DATA:  PE suspected, high clinical probability EXAM: CT ANGIOGRAPHY CHEST WITH CONTRAST TECHNIQUE: Multidetector CT imaging of the chest was performed using the standard protocol during bolus administration of intravenous contrast. Multiplanar CT image reconstructions and MIPs were obtained to evaluate the vascular anatomy. RADIATION DOSE REDUCTION: This exam was performed according to the departmental dose-optimization program which includes automated exposure control, adjustment of the mA and/or kV  according to patient size and/or use of iterative reconstruction technique. CONTRAST:  7m OMNIPAQUE IOHEXOL 350 MG/ML SOLN COMPARISON:  CT pulmonary angiogram, 09/21/2022 FINDINGS: Cardiovascular: Satisfactory opacification of the pulmonary arteries to the segmental level. No evidence of pulmonary embolism. Normal heart size. No pericardial effusion. Mediastinum/Nodes: No enlarged mediastinal, hilar, or axillary lymph nodes. Thyroid gland, trachea, and esophagus demonstrate no significant findings. Lungs/Pleura: Lungs are clear. No pleural effusion or pneumothorax. Upper Abdomen: No acute abnormality. Small, definitively benign cysts of the liver dome, for which no further follow-up or characterization is required. Musculoskeletal: No chest wall abnormality. No acute osseous findings. Review of the MIP images confirms the above findings. IMPRESSION: Negative examination for pulmonary embolism. Electronically Signed   By: ADelanna AhmadiM.D.   On: 09/22/2022 18:27   CT Angio Chest Pulmonary Embolism (PE) W or WO Contrast  Result Date: 09/21/2022 CLINICAL DATA:  Shortness of breath, dyspnea on exertion, elevated D-dimer, worsening symptoms since  onset 2 days after surgery on 07/15/2022, question pulmonary embolism. Past history of pulmonary embolism in 2020 EXAM: CT ANGIOGRAPHY CHEST WITH CONTRAST TECHNIQUE: Multidetector CT imaging of the chest was performed using the standard protocol during bolus administration of intravenous contrast. Multiplanar CT image reconstructions and MIPs were obtained to evaluate the vascular anatomy. RADIATION DOSE REDUCTION: This exam was performed according to the departmental dose-optimization program which includes automated exposure control, adjustment of the mA and/or kV according to patient size and/or use of iterative reconstruction technique. CONTRAST:  16m ISOVUE-370 IOPAMIDOL (ISOVUE-370) INJECTION 76% IV COMPARISON:  05/31/2021 FINDINGS: Cardiovascular: Aorta normal  caliber without aneurysm or dissection. Mild enlargement of cardiac chambers. No pericardial effusion. Pulmonary arteries well opacified and patent. No evidence of pulmonary embolism. Mediastinum/Nodes: Small amount of residual thymic tissue anterior mediastinum. No adenopathy. Base of cervical region normal appearance. No esophageal abnormalities. Lungs clear. No pulmonary infiltrate, pleural effusion, or pneumothorax. Lungs/Pleura: Lungs clear. No pulmonary infiltrate, pleural effusion, pneumothorax or mass. Upper Abdomen: Small hepatic cysts. Remaining visualized upper abdomen unremarkable. Musculoskeletal: Osseous structures unremarkable. Review of the MIP images confirms the above findings. IMPRESSION: No evidence of pulmonary embolism. Mild enlargement of cardiac chambers. No acute intrathoracic abnormalities. Electronically Signed   By: MLavonia DanaM.D.   On: 09/21/2022 09:41    Procedures Procedures    Medications Ordered in ED Medications  HYDROmorphone (DILAUDID) injection 0.5 mg (0.5 mg Intravenous Given 09/22/22 1634)  iohexol (OMNIPAQUE) 350 MG/ML injection 100 mL (70 mLs Intravenous Contrast Given 09/22/22 1802)    ED Course/ Medical Decision Making/ A&P                          Medical Decision Making Amount and/or Complexity of Data Reviewed Labs: ordered. Decision-making details documented in ED Course. Radiology: ordered. Decision-making details documented in ED Course.  Risk Prescription drug management.    Patient uncomfortable appearing d/t pain. She is non-toxic, afebrile, and HDS. No tachycardia.   PE at the top of differential given several months of cough now with acute onset pain underneath L shoulder blade, worse with deep inspiration. Patient has history of hospitalization/surgery in September and also has a prior h/o PE during pregnancy, does not currently take blood thinners. She states this feels very similar to that pain. Shared decision making with the  patient about the fact that she just had a CT PE performed yesterday that showed no PE. I discussed the radiation and the increased risk of breast cancers and other cancers. Patient states that this feels exactly like her prior PE but worse and understands that because the pain started this morning it is possible that she has since developed a PE. Patient states that she understands the risks of the scan but does want the scan as this pain is very severe.   Also considered ACS/arrhythmia, PNA, PTX; EKG without signs of ischemia and NSR. Troponin <2. Also consider covid/flu, muscle spasm, pleurisy.    I have personally reviewed and interpreted all labs and imaging.  Patient was maintained on a cardiac monitor.  I have personally interpreted the telemetry as NSR.  For further updates, please see ED course.  ED Course:  Clinical Course as of 09/22/22 1948  Thu Sep 22, 2022  1616 Troponin I (High Sensitivity): <2 [HN]  1616 CBC wnl [HN]  15277Basic metabolic panel wnl [HN]  18242CT Angio Chest PE W and/or Wo Contrast Negative examination for pulmonary embolism. [HN]  1947 Patient with reassuring labs, negative trop, negative CT PE. Patient's pain likely d/t pleurisy or muscle spasm. Discussed with patient who reports undrestanding. Instructed to alternate tylenol/ibuprofen, can use lidocaine patches or heating pad if these help her. Instructed to call PCP in the AM and make f/u appt for next week. Patient given DC instructions/return precautions, all questions answered to patients' satisfaction. [HN]    Clinical Course User Index [HN] Audley Hose, MD    Dispo: DC in stable condition         Final Clinical Impression(s) / ED Diagnoses Final diagnoses:  Pleurisy    Rx / DC Orders ED Discharge Orders     None        This note was created using dictation software, which may contain spelling or grammatical errors.    Audley Hose, MD 09/22/22 1949

## 2022-09-27 NOTE — Addendum Note (Signed)
Addended by: Cassandria Anger on: 09/27/2022 06:58 AM   Modules accepted: Orders

## 2022-10-07 ENCOUNTER — Other Ambulatory Visit (HOSPITAL_COMMUNITY): Payer: Self-pay
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
## 2022-10-13 ENCOUNTER — Encounter: Payer: Self-pay | Admitting: Student

## 2022-10-13 ENCOUNTER — Other Ambulatory Visit (HOSPITAL_COMMUNITY): Payer: Self-pay

## 2022-10-13 ENCOUNTER — Telehealth: Payer: Self-pay | Admitting: Student

## 2022-10-13 ENCOUNTER — Ambulatory Visit (INDEPENDENT_AMBULATORY_CARE_PROVIDER_SITE_OTHER): Payer: Medicaid Other | Admitting: Student

## 2022-10-13 VITALS — BP 112/68 | HR 80 | Temp 98.3°F | Ht 66.0 in | Wt 218.4 lb

## 2022-10-13 DIAGNOSIS — J4540 Moderate persistent asthma, uncomplicated: Secondary | ICD-10-CM

## 2022-10-13 DIAGNOSIS — R053 Chronic cough: Secondary | ICD-10-CM

## 2022-10-13 NOTE — Telephone Encounter (Signed)
Per benefits investigation shows the covered ICS/LABA are Brand Advair Diskus, Advair HFA, Dulera and Brand Symbicort.

## 2022-10-13 NOTE — Patient Instructions (Addendum)
-   lab today - I'll send message to pharmacy to see which LABA/ICS inhaler is least expensive and will send you my chart message once I've heard back - breathing tests in 8 weeks or so on same day as next clinic visit

## 2022-10-13 NOTE — Telephone Encounter (Signed)
What is least expensive laba/ics for her?  Thanks! 

## 2022-10-14 LAB — RESPIRATORY ALLERGY PROFILE REGION II ~~LOC~~
Allergen, A. alternata, m6: <0.1 kU/L
Allergen, Cedar tree, t12: <0.1 kU/L
Allergen, Comm Silver Birch, t9: <0.1 kU/L
Allergen, Cottonwood, t14: <0.1 kU/L
Allergen, D pternoyssinus,d7: <0.1 kU/L
Allergen, Mouse Urine Protein, e78: <0.1 kU/L
Allergen, Mulberry, t76: <0.1 kU/L
Allergen, Oak,t7: <0.1 kU/L
Allergen, P. notatum, m1: <0.1 kU/L
Aspergillus fumigatus, m3: <0.1 kU/L
Bermuda Grass: <0.1 kU/L
Box Elder IgE: <0.1 kU/L
CLADOSPORIUM HERBARUM (M2) IGE: <0.1 kU/L
COMMON RAGWEED (SHORT) (W1) IGE: <0.1 kU/L
Cat Dander: <0.1 kU/L
Class: 0
Class: 0
Class: 0
Class: 0
Class: 0
Class: 0
Class: 0
Class: 0
Class: 0
Class: 0
Class: 0
Class: 0
Class: 0
Class: 0
Class: 0
Class: 0
Class: 0
Class: 0
Class: 0
Class: 0
Class: 0
Class: 0
Class: 0
Cockroach: 0.14 kU/L — ABNORMAL HIGH
D. farinae: <0.1 kU/L
Dog Dander: <0.1 kU/L
Elm IgE: <0.1 kU/L
IgE (Immunoglobulin E), Serum: 39 kU/L (ref <=114)
Johnson Grass: <0.1 kU/L
Pecan/Hickory Tree IgE: <0.1 kU/L
Rough Pigweed  IgE: <0.1 kU/L
Sheep Sorrel IgE: <0.1 kU/L
Timothy Grass: <0.1 kU/L

## 2022-10-14 LAB — INTERPRETATION:

## 2022-10-14 MED ORDER — BUDESONIDE-FORMOTEROL FUMARATE 160-4.5 MCG/ACT IN AERO: 2.0000 | INHALATION_SPRAY | Freq: Two times a day (BID) | RESPIRATORY_TRACT | 6 refills | Status: DC

## 2022-11-16 ENCOUNTER — Ambulatory Visit (INDEPENDENT_AMBULATORY_CARE_PROVIDER_SITE_OTHER): Payer: Medicaid Other

## 2022-11-16 ENCOUNTER — Ambulatory Visit
Admission: EM | Admit: 2022-11-16 | Discharge: 2022-11-16 | Disposition: A | Discharge status: Home / Self Care | Payer: Medicaid Other | Attending: Internal Medicine | Admitting: Internal Medicine

## 2022-11-16 DIAGNOSIS — M25531 Pain in right wrist: Secondary | ICD-10-CM
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
## 2022-11-16 NOTE — Discharge Instructions (Signed)
X-ray of your wrist was normal.  Wrist brace has been applied.  Apply ice and elevate extremity.  Follow-up with orthopedist tomorrow to schedule appointment for further evaluation and management.

## 2022-11-16 NOTE — ED Triage Notes (Signed)
Pt c/o pushing on car door and suddenly feeling a sharp shooting pain radiate into elbow. Occurred today.

## 2022-11-22 ENCOUNTER — Ambulatory Visit (INDEPENDENT_AMBULATORY_CARE_PROVIDER_SITE_OTHER): Payer: Medicaid Other | Admitting: Internal Medicine

## 2022-11-22 ENCOUNTER — Encounter: Payer: Self-pay | Admitting: Internal Medicine

## 2022-11-22 ENCOUNTER — Other Ambulatory Visit (HOSPITAL_COMMUNITY): Payer: Self-pay

## 2022-11-22 ENCOUNTER — Other Ambulatory Visit: Payer: Self-pay

## 2022-11-22 ENCOUNTER — Encounter: Payer: Self-pay | Admitting: Hematology and Oncology

## 2022-11-22 VITALS — BP 120/90 | HR 120 | Temp 97.5°F | Ht 66.0 in

## 2022-11-22 DIAGNOSIS — R109 Unspecified abdominal pain: Secondary | ICD-10-CM

## 2022-11-22 DIAGNOSIS — R1031 Right lower quadrant pain: Secondary | ICD-10-CM | POA: Diagnosis not present

## 2022-11-22 DIAGNOSIS — R300 Dysuria: Secondary | ICD-10-CM

## 2022-11-22 LAB — POC URINALSYSI DIPSTICK (AUTOMATED)
Bilirubin, UA: NEGATIVE
Glucose, UA: NEGATIVE
Ketones, UA: NEGATIVE
Leukocytes, UA: NEGATIVE
Nitrite, UA: NEGATIVE
Protein, UA: POSITIVE — AB
Spec Grav, UA: 1.02 (ref 1.010–1.025)
Urobilinogen, UA: 0.2 E.U./dL
pH, UA: 6.5 (ref 5.0–8.0)

## 2022-11-22 MED ORDER — ONDANSETRON HCL 4 MG PO TABS
4.0000 mg | ORAL_TABLET | Freq: Three times a day (TID) | ORAL | 0 refills | Status: DC | PRN
  Filled 2022-11-22: qty 20, 7d supply, fill #0

## 2022-11-22 MED ORDER — HYDROCODONE-ACETAMINOPHEN 5-325 MG PO TABS
1.0000 | ORAL_TABLET | Freq: Four times a day (QID) | ORAL | 0 refills | Status: DC | PRN
  Filled 2022-11-22: qty 30, 4d supply, fill #0

## 2022-11-22 MED ORDER — TAMSULOSIN HCL 0.4 MG PO CAPS
0.4000 mg | ORAL_CAPSULE | Freq: Every day | ORAL | 3 refills | Status: DC
  Filled 2022-11-22: qty 30, 30d supply, fill #0

## 2022-11-22 NOTE — Patient Instructions (Addendum)
Medications changes include : flomax mg daily , zofran as needed. Pain medication as needed.      A Ct kidney was ordered.     Someone will call you to schedule an appointment.

## 2022-11-22 NOTE — Progress Notes (Signed)
Subjective:    Patient ID: Meghan Reed, female    DOB: 12-Jan-1986, 37 y.o.   MRN: [id number]      HPI Meghan Reed is here for  Chief Complaint  Patient presents with   Flank Pain    Right sided flank pain      Day 3 of symptoms- today the pain is worse.  Pain in right side - aching two days ago, but intensity of pain has increased.  This morning she woke up with significant pain on the right side, nausea and headache.  She did take Tylenol/Advil that helped with a headache, but not the pain.  The pain has been getting worse throughout the day.  Sometimes when she urinates there will be a little bit of improvement in the pain, but has not gone away.  The pain is constant.  She denies any urinary symptoms.  She does have urinary urgency and is having her hysterectomy, but that is not new.  She denies any blood in the urine, pain with urination or increase in frequency.  No h/o kidney stones.      Medications and allergies reviewed with patient and updated if appropriate.  Current Outpatient Medications on File Prior to Visit  Medication Sig Dispense Refill   albuterol (PROVENTIL) (2.5 MG/3ML) 0.083% nebulizer solution Take 3 mLs (2.5 mg total) by nebulization every 6 (six) hours as needed for wheezing or shortness of breath. 150 mL 1   albuterol (VENTOLIN HFA) 108 (90 Base) MCG/ACT inhaler Inhale 2 puffs into the lungs every 4 (four) hours as needed for wheezing or shortness of breath. 1 each 0   budesonide-formoterol (SYMBICORT) 160-4.5 MCG/ACT inhaler Inhale 2 puffs into the lungs 2 (two) times daily. 1 each 6   cyclobenzaprine (FLEXERIL) 10 MG tablet Take 1 tablet (10 mg total) by mouth every 8 (eight) hours as needed for muscle spasms. 15 tablet 0   Galcanezumab-gnlm (EMGALITY) 120 MG/ML SOAJ Inject 120 mg into the skin every 30 days. 3 mL 3   omeprazole (PRILOSEC OTC) 20 MG tablet Take 1 tablet (20 mg total) by mouth daily. (Patient taking differently: Take 20 mg  by mouth daily as needed (acid reflux).) 30 tablet 3   potassium chloride SA (KLOR-CON M) 20 MEQ tablet Take 1 tablet (20 mEq total) by mouth daily. 10 tablet 0   solifenacin (VESICARE) 10 MG tablet Take 1 tablet (10 mg total) by mouth daily. 30 tablet 11   traMADol (ULTRAM) 50 MG tablet Take 1-2 tablets (50-100 mg total) by mouth every 6 (six) hours as needed (cough). 40 tablet 1   Ubrogepant (UBRELVY) 100 MG TABS Take 1 tablet by mouth daily as needed. (Patient taking differently: Take 1 tablet by mouth daily as needed (migraine).) 8 tablet 11   No current facility-administered medications on file prior to visit.    Review of Systems  Constitutional:  Positive for fatigue. Negative for fever.  Gastrointestinal:  Positive for abdominal pain and nausea. Negative for constipation and diarrhea.  Genitourinary:  Positive for flank pain and urgency (not new). Negative for difficulty urinating, dysuria, frequency and hematuria.       No urine odor  Neurological:  Positive for headaches.       Objective:   Vitals:   11/22/22 1617  BP: (!) 120/90  Pulse: (!) 120  Temp: (!) 97.5 F (36.4 C)  SpO2: 100%   BP Readings from Last 3 Encounters:  11/22/22 (!) 120/90  11/16/22 111/79  10/13/22  112/68   Wt Readings from Last 3 Encounters:  10/13/22 218 lb 6.4 oz (99.1 kg)  09/22/22 210 lb (95.3 kg)  09/09/22 210 lb (95.3 kg)   Body mass index is 35.25 kg/m.    Physical Exam Constitutional:      General: She is not in acute distress (Uncomfortable-in pain).    Appearance: Normal appearance. She is not ill-appearing.  HENT:     Head: Normocephalic and atraumatic.  Abdominal:     General: There is no distension.     Palpations: Abdomen is soft.     Tenderness: There is abdominal tenderness (Minimal tenderness right side-right mid quadrant). There is right CVA tenderness (No pain overlying the kidney, but pain lateral to CVA). There is no left CVA tenderness.  Musculoskeletal:      Right lower leg: No edema.     Left lower leg: No edema.  Skin:    General: Skin is warm and dry.     Findings: No rash.  Neurological:     Mental Status: She is alert.            Assessment & Plan:     Right-sided flank pain, right-sided abdominal pain: Acute Pain started 3 days ago and has increased in intensity-pain is now constant No significant urinary symptoms to suggest UTI, urine dip does have small amount of blood and protein Send urine for culture Concern for kidney stone CT renal Flomax 0.4 mg daily Hydrocodone-acetaminophen 5-325 mg 1-2 tabs every 6 hours as needed for severe pain Zofran 4 mg every 8 hours as needed nausea If pain becomes more severe advised emergency room tonight

## 2022-11-23 ENCOUNTER — Ambulatory Visit
Admission: RE | Admit: 2022-11-23 | Discharge: 2022-11-23 | Disposition: A | Discharge status: Home / Self Care | Payer: Medicaid Other | Source: Ambulatory Visit | Attending: Internal Medicine | Admitting: Internal Medicine

## 2022-11-23 DIAGNOSIS — R1031 Right lower quadrant pain: Secondary | ICD-10-CM

## 2022-11-24 ENCOUNTER — Ambulatory Visit (INDEPENDENT_AMBULATORY_CARE_PROVIDER_SITE_OTHER): Payer: Medicaid Other | Admitting: Student

## 2022-11-24 ENCOUNTER — Ambulatory Visit: Payer: Medicaid Other | Admitting: Student

## 2022-11-24 DIAGNOSIS — R053 Chronic cough: Secondary | ICD-10-CM | POA: Diagnosis not present

## 2022-11-24 LAB — CULTURE, URINE COMPREHENSIVE: RESULT:: NO GROWTH

## 2022-11-24 LAB — PULMONARY FUNCTION TEST
FEF 25-75 Post: 4.08 L/sec
FEF 25-75 Pre: 3.53 L/sec
FEF2575-%Change-Post: 15 %
FEF2575-%Pred-Post: 122 %
FEF2575-%Pred-Pre: 106 %
FEV1-%Change-Post: 2 %
FEV1-%Pred-Post: 92 %
FEV1-%Pred-Pre: 90 %
FEV1-Post: 2.98 L
FEV1-Pre: 2.92 L
FEV1FVC-%Change-Post: 1 %
FEV1FVC-%Pred-Pre: 104 %
FEV6-%Change-Post: 0 %
FEV6-%Pred-Post: 88 %
FEV6-%Pred-Pre: 87 %
FEV6-Post: 3.4 L
FEV6-Pre: 3.38 L
FEV6FVC-%Pred-Post: 101 %
FEV6FVC-%Pred-Pre: 101 %
FVC-%Change-Post: 0 %
FVC-%Pred-Post: 86 %
FVC-%Pred-Pre: 86 %
FVC-Post: 3.4 L
FVC-Pre: 3.38 L
Post FEV1/FVC ratio: 88 %
Post FEV6/FVC ratio: 100 %
Pre FEV1/FVC ratio: 86 %
Pre FEV6/FVC Ratio: 100 %

## 2022-11-24 NOTE — Progress Notes (Signed)
Spirometry pre and post done today. 

## 2022-11-29 ENCOUNTER — Ambulatory Visit (INDEPENDENT_AMBULATORY_CARE_PROVIDER_SITE_OTHER): Payer: Medicaid Other | Admitting: Internal Medicine

## 2022-11-29 ENCOUNTER — Encounter: Payer: Self-pay | Admitting: Internal Medicine

## 2022-11-29 ENCOUNTER — Other Ambulatory Visit (HOSPITAL_COMMUNITY): Payer: Self-pay

## 2022-11-29 VITALS — BP 110/62 | HR 111 | Temp 98.9°F | Ht 66.0 in

## 2022-11-29 DIAGNOSIS — J069 Acute upper respiratory infection, unspecified: Secondary | ICD-10-CM | POA: Diagnosis not present

## 2022-11-29 DIAGNOSIS — J4521 Mild intermittent asthma with (acute) exacerbation: Secondary | ICD-10-CM | POA: Diagnosis not present

## 2022-11-29 LAB — POCT INFLUENZA A/B
Influenza A, POC: NEGATIVE
Influenza B, POC: NEGATIVE

## 2022-11-29 MED ORDER — METHYLPREDNISOLONE 4 MG PO TBPK
ORAL_TABLET | ORAL | 0 refills | Status: DC
  Filled 2022-11-29: qty 21, 6d supply, fill #0

## 2022-11-29 MED ORDER — HYDROCODONE BIT-HOMATROP MBR 5-1.5 MG/5ML PO SOLN
5.0000 mL | Freq: Four times a day (QID) | ORAL | 0 refills | Status: AC | PRN
  Filled 2022-11-29: qty 240, 12d supply, fill #0

## 2022-11-29 MED ORDER — AZITHROMYCIN 250 MG PO TABS
ORAL_TABLET | ORAL | 0 refills | Status: DC
  Filled 2022-11-29: qty 6, 5d supply, fill #0

## 2022-11-29 MED ORDER — PSEUDOEPHEDRINE HCL ER 120 MG PO TB12
120.0000 mg | ORAL_TABLET | Freq: Two times a day (BID) | ORAL | 1 refills | Status: AC | PRN
  Filled 2022-11-29 – 2023-02-08 (×2): qty 60, 30d supply, fill #0

## 2022-11-29 NOTE — Assessment & Plan Note (Addendum)
New.  Start Z pac if worse Hycodan syr prn cough Sudafed as needed congestion Medrol pac if worse

## 2022-11-29 NOTE — Progress Notes (Signed)
Subjective:  Patient ID: Meghan Reed, female    DOB: Jun 17, 1986  Age: 37 y.o. MRN: [id number]  CC: sick visit  (Face hurt , congestion and cough , chills and body ache, sneezing  started yesterday )   HPI Meghan Reed presents for facial pain, congestion, cough, chills and bodyaches of 2 days duration  Outpatient Medications Prior to Visit  Medication Sig Dispense Refill   albuterol (PROVENTIL) (2.5 MG/3ML) 0.083% nebulizer solution Take 3 mLs (2.5 mg total) by nebulization every 6 (six) hours as needed for wheezing or shortness of breath. 150 mL 1   cyclobenzaprine (FLEXERIL) 10 MG tablet Take 1 tablet (10 mg total) by mouth every 8 (eight) hours as needed for muscle spasms. 15 tablet 0   Galcanezumab-gnlm (EMGALITY) 120 MG/ML SOAJ Inject 120 mg into the skin every 30 days. 3 mL 3   omeprazole (PRILOSEC OTC) 20 MG tablet Take 1 tablet (20 mg total) by mouth daily. (Patient taking differently: Take 20 mg by mouth daily as needed (acid reflux).) 30 tablet 3   ondansetron (ZOFRAN) 4 MG tablet Take 1 tablet (4 mg total) by mouth every 8 (eight) hours as needed for nausea or vomiting. 20 tablet 0   Ubrogepant (UBRELVY) 100 MG TABS Take 1 tablet by mouth daily as needed. (Patient taking differently: Take 1 tablet by mouth daily as needed (migraine).) 8 tablet 11   albuterol (VENTOLIN HFA) 108 (90 Base) MCG/ACT inhaler Inhale 2 puffs into the lungs every 4 (four) hours as needed for wheezing or shortness of breath. 1 each 0   budesonide-formoterol (SYMBICORT) 160-4.5 MCG/ACT inhaler Inhale 2 puffs into the lungs 2 (two) times daily. 1 each 6   HYDROcodone-acetaminophen (NORCO) 5-325 MG tablet Take 1-2 tablets by mouth every 6 (six) hours as needed for moderate pain. 30 tablet 0   potassium chloride SA (KLOR-CON M) 20 MEQ tablet Take 1 tablet (20 mEq total) by mouth daily. 10 tablet 0   solifenacin (VESICARE) 10 MG tablet Take 1 tablet (10 mg total) by mouth  daily. 30 tablet 11   tamsulosin (FLOMAX) 0.4 MG CAPS capsule Take 1 capsule (0.4 mg total) by mouth daily. 30 capsule 3   traMADol (ULTRAM) 50 MG tablet Take 1-2 tablets (50-100 mg total) by mouth every 6 (six) hours as needed (cough). 40 tablet 1   No facility-administered medications prior to visit.    ROS: Review of Systems  Constitutional:  Positive for chills and fatigue. Negative for activity change, appetite change and unexpected weight change.  HENT:  Positive for rhinorrhea, sinus pressure, sinus pain and sore throat. Negative for congestion and mouth sores.   Eyes:  Negative for visual disturbance.  Respiratory:  Positive for cough and wheezing. Negative for chest tightness and shortness of breath.   Gastrointestinal:  Negative for abdominal pain and nausea.  Genitourinary:  Negative for difficulty urinating, frequency and vaginal pain.  Musculoskeletal:  Negative for back pain and gait problem.  Skin:  Negative for pallor and rash.  Neurological:  Negative for dizziness, tremors, weakness, numbness and headaches.  Psychiatric/Behavioral:  Negative for confusion and sleep disturbance.     Objective:  BP 110/62 (BP Location: Right Arm, Patient Position: Sitting, Cuff Size: Normal)   Pulse (!) 111   Temp 98.9 F (37.2 C) (Oral)   Ht '5\' 6"'$  (1.676 m)   LMP  (LMP Unknown)   SpO2 90%   BMI 35.25 kg/m   BP Readings from Last 3 Encounters:  12/12/22  112/76  11/29/22 110/62  11/22/22 (!) 120/90    Wt Readings from Last 3 Encounters:  12/12/22 220 lb 6.4 oz (100 kg)  10/13/22 218 lb 6.4 oz (99.1 kg)  09/22/22 210 lb (95.3 kg)    Physical Exam Constitutional:      General: She is not in acute distress.    Appearance: She is well-developed. She is not toxic-appearing.  HENT:     Head: Normocephalic.     Right Ear: External ear normal.     Left Ear: External ear normal.     Nose: Congestion and rhinorrhea present.     Mouth/Throat:     Pharynx: Posterior  oropharyngeal erythema present. No oropharyngeal exudate.  Eyes:     General:        Right eye: No discharge.        Left eye: No discharge.     Conjunctiva/sclera: Conjunctivae normal.     Pupils: Pupils are equal, round, and reactive to light.  Neck:     Thyroid: No thyromegaly.     Vascular: No JVD.     Trachea: No tracheal deviation.  Cardiovascular:     Rate and Rhythm: Normal rate and regular rhythm.     Heart sounds: Normal heart sounds.  Pulmonary:     Effort: No respiratory distress.     Breath sounds: No stridor. No wheezing.  Abdominal:     General: Bowel sounds are normal. There is no distension.     Palpations: Abdomen is soft. There is no mass.     Tenderness: There is no abdominal tenderness. There is no guarding or rebound.  Musculoskeletal:        General: No tenderness.     Cervical back: Normal range of motion and neck supple. No rigidity.  Lymphadenopathy:     Cervical: No cervical adenopathy.  Skin:    Findings: No erythema or rash.  Neurological:     Cranial Nerves: No cranial nerve deficit.     Motor: No abnormal muscle tone.     Coordination: Coordination normal.     Deep Tendon Reflexes: Reflexes normal.  Psychiatric:        Behavior: Behavior normal.        Thought Content: Thought content normal.        Judgment: Judgment normal.   The patient appears tired and congested.  Erythematous throat and nasal passages.  Lungs are clear  Lab Results  Component Value Date   WBC 7.1 09/22/2022   HGB 12.8 09/22/2022   HCT 37.5 09/22/2022   PLT 232 09/22/2022   GLUCOSE 74 09/22/2022   CHOL 234 (H) 08/16/2021   TRIG 78 08/16/2021   HDL 55 08/16/2021   LDLCALC 165 (H) 08/16/2021   ALT 16 09/20/2022   AST 17 09/20/2022   NA 136 09/22/2022   K 3.9 09/22/2022   CL 101 09/22/2022   CREATININE 0.89 09/22/2022   BUN 10 09/22/2022   CO2 28 09/22/2022   TSH 0.909 12/21/2021   INR 1.5 (H) 10/31/2019   HGBA1C 5.2 08/12/2019    CT RENAL STONE  STUDY  Result Date: 11/23/2022 CLINICAL DATA:  RIGHT flank and abdominal pain suspected kidney stone. Nausea and bloating for 4 days. History of hernia repair, hysterectomy, asthma, former smoker EXAM: CT ABDOMEN AND PELVIS WITHOUT CONTRAST TECHNIQUE: Multidetector CT imaging of the abdomen and pelvis was performed following the standard protocol without IV contrast. RADIATION DOSE REDUCTION: This exam was performed according to the departmental dose-optimization  program which includes automated exposure control, adjustment of the mA and/or kV according to patient size and/or use of iterative reconstruction technique. COMPARISON:  05/31/2021 FINDINGS: Lower chest: Lung bases clear Hepatobiliary: Gallbladder contracted. Small hepatic cysts RIGHT lobe stable. Liver otherwise normal. Pancreas: Normal appearance Spleen: Normal appearance Adrenals/Urinary Tract: Adrenal glands, kidneys, ureters, and bladder normal appearance Stomach/Bowel: Normal appendix. Stomach and bowel loops normal appearance Vascular/Lymphatic: Aorta normal caliber. No adenopathy. Scattered pelvic phleboliths. Reproductive: Uterus surgically absent.  Ovaries unremarkable. Other: No free air or free fluid. No hernia or inflammatory process. Musculoskeletal: Unremarkable IMPRESSION: No acute intra-abdominal or intrapelvic abnormalities. Electronically Signed   By: Lavonia Dana M.D.   On: 11/23/2022 16:16    Assessment & Plan:   Problem List Items Addressed This Visit       Respiratory   Upper respiratory infection - Primary    New.  Start Z pac if worse Hycodan syr prn cough Sudafed as needed congestion Medrol pac if worse      Relevant Orders   POCT Influenza A/B (Completed)   Asthma    Start Z pac if worse Hycodan syr prn cough Sudafed as needed congestion Medrol pac if worse On Symbicort, albuterol as needed         Meds ordered this encounter  Medications   HYDROcodone bit-homatropine (HYCODAN) 5-1.5 MG/5ML syrup     Sig: Take 5 mLs by mouth every 6 (six) hours as needed for up to 10 days for cough.    Dispense:  240 mL    Refill:  0   DISCONTD: azithromycin (ZITHROMAX Z-PAK) 250 MG tablet    Sig: Take as directed per package instructions.    Dispense:  6 tablet    Refill:  0   pseudoephedrine (SUDAFED) 120 MG 12 hr tablet    Sig: Take 1 tablet (120 mg total) by mouth 2 (two) times daily as needed for congestion.    Dispense:  60 tablet    Refill:  1   DISCONTD: methylPREDNISolone (MEDROL DOSEPAK) 4 MG TBPK tablet    Sig: Take as directed per package instructions.    Dispense:  21 tablet    Refill:  0      Follow-up: No follow-ups on file.  Walker Kehr, MD

## 2022-11-29 NOTE — Patient Instructions (Signed)
You can use over-the-counter  "cold" medicines  such as "Tylenol cold" , "Advil cold",  "Mucinex" or" Mucinex D"  for cough and congestion.   Avoid decongestants if you have high blood pressure and use "Afrin" nasal spray for nasal congestion as directed. Use " Delsym" or" Robitussin" cough syrup varietis for cough.  You can use plain "Tylenol" or "Advil" for fever, chills and achyness. Use Halls or Ricola cough drops.   Please, make an appointment if you are not better or if you're worse.  

## 2022-11-30 ENCOUNTER — Other Ambulatory Visit (HOSPITAL_COMMUNITY): Payer: Self-pay
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
## 2022-12-12 ENCOUNTER — Ambulatory Visit (INDEPENDENT_AMBULATORY_CARE_PROVIDER_SITE_OTHER): Payer: Medicaid Other | Admitting: Student

## 2022-12-12 ENCOUNTER — Encounter: Payer: Self-pay | Admitting: Hematology and Oncology

## 2022-12-12 ENCOUNTER — Encounter: Payer: Self-pay | Admitting: Student

## 2022-12-12 ENCOUNTER — Other Ambulatory Visit (HOSPITAL_COMMUNITY): Payer: Self-pay

## 2022-12-12 VITALS — BP 112/76 | HR 85 | Temp 98.3°F | Ht 66.0 in | Wt 220.4 lb

## 2022-12-12 DIAGNOSIS — R053 Chronic cough: Secondary | ICD-10-CM

## 2022-12-12 MED ORDER — ALBUTEROL SULFATE HFA 108 (90 BASE) MCG/ACT IN AERS
2.0000 | INHALATION_SPRAY | RESPIRATORY_TRACT | 6 refills | Status: DC | PRN
  Filled 2022-12-12 – 2023-02-08 (×2): qty 18, 17d supply, fill #0

## 2022-12-12 MED ORDER — BUDESONIDE-FORMOTEROL FUMARATE 160-4.5 MCG/ACT IN AERO
2.0000 | INHALATION_SPRAY | Freq: Two times a day (BID) | RESPIRATORY_TRACT | 11 refills | Status: DC
  Filled 2022-12-12 – 2023-02-08 (×2): qty 10.2, 30d supply, fill #0
  Filled 2023-05-22: qty 10.2, 30d supply, fill #1

## 2022-12-12 NOTE — Patient Instructions (Signed)
-   start taking symbicort 2 puffs twice daily rinse mouth and brush teeth/tongue after each use - albuterol as needed - see you in 8-12 weeks

## 2022-12-19 ENCOUNTER — Telehealth: Payer: Self-pay | Admitting: *Deleted

## 2022-12-19 NOTE — Telephone Encounter (Signed)
"  Approved. This drug has been approved. Approved quantity: 8 units per 30 day(s). The drug has been approved from 12/05/2022 to 12/19/2023. Please call the pharmacy to process your prescription claim. Generic or biosimilar substitution may be required when available and preferred on the formulary."

## 2022-12-19 NOTE — Telephone Encounter (Signed)
Submitted PA Ubrelvy on covermymeds. Key: [id number]. Waiting on determination from [facility].

## 2022-12-20 ENCOUNTER — Other Ambulatory Visit (HOSPITAL_COMMUNITY): Payer: Self-pay

## 2023-01-07 ENCOUNTER — Other Ambulatory Visit (HOSPITAL_COMMUNITY): Payer: Self-pay

## 2023-01-08 ENCOUNTER — Encounter: Payer: Self-pay | Admitting: Internal Medicine

## 2023-01-08 NOTE — Assessment & Plan Note (Signed)
Start Z pac if worse Hycodan syr prn cough Sudafed as needed congestion Medrol pac if worse On Symbicort, albuterol as needed

## 2023-01-09 ENCOUNTER — Other Ambulatory Visit (HOSPITAL_COMMUNITY): Payer: Self-pay

## 2023-01-17 ENCOUNTER — Other Ambulatory Visit (HOSPITAL_COMMUNITY): Payer: Self-pay

## 2023-01-23 ENCOUNTER — Encounter (HOSPITAL_BASED_OUTPATIENT_CLINIC_OR_DEPARTMENT_OTHER): Payer: Self-pay | Admitting: Emergency Medicine

## 2023-01-23 ENCOUNTER — Emergency Department (HOSPITAL_BASED_OUTPATIENT_CLINIC_OR_DEPARTMENT_OTHER): Payer: Medicaid Other

## 2023-01-23 ENCOUNTER — Other Ambulatory Visit: Payer: Self-pay

## 2023-01-23 ENCOUNTER — Emergency Department (HOSPITAL_BASED_OUTPATIENT_CLINIC_OR_DEPARTMENT_OTHER)
Admission: EM | Admit: 2023-01-23 | Discharge: 2023-01-23 | Disposition: A | Discharge status: Home / Self Care | Payer: Medicaid Other | Attending: Emergency Medicine | Admitting: Emergency Medicine

## 2023-01-23 DIAGNOSIS — S161XXA Strain of muscle, fascia and tendon at neck level, initial encounter: Secondary | ICD-10-CM | POA: Diagnosis not present

## 2023-01-23 DIAGNOSIS — Y92410 Unspecified street and highway as the place of occurrence of the external cause: Secondary | ICD-10-CM | POA: Diagnosis not present

## 2023-01-23 DIAGNOSIS — S199XXA Unspecified injury of neck, initial encounter: Secondary | ICD-10-CM | POA: Diagnosis present

## 2023-01-23 MED ORDER — METHOCARBAMOL 500 MG PO TABS
750.0000 mg | ORAL_TABLET | Freq: Once | ORAL | Status: AC
  Administered 2023-01-23: 750 mg via ORAL
  Filled 2023-01-23: qty 2

## 2023-01-23 MED ORDER — NAPROXEN 375 MG PO TABS: 375.0000 mg | ORAL_TABLET | Freq: Two times a day (BID) | ORAL | 0 refills | Status: DC | PRN

## 2023-01-23 MED ORDER — KETOROLAC TROMETHAMINE 30 MG/ML IJ SOLN
30.0000 mg | Freq: Once | INTRAMUSCULAR | Status: AC
  Administered 2023-01-23: 30 mg via INTRAMUSCULAR
  Filled 2023-01-23: qty 1

## 2023-01-23 MED ORDER — METHOCARBAMOL 750 MG PO TABS: 750.0000 mg | ORAL_TABLET | Freq: Three times a day (TID) | ORAL | 0 refills | Status: DC | PRN
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
## 2023-01-23 NOTE — Discharge Instructions (Signed)
1.  Naproxen as an anti-inflammatory.  It may be helpful for your neck pain for the next couple of days.  Be sure to take your omeprazole daily.  You are getting any abdominal pain or discomfort, discontinue the naproxen immediately.  In addition to the naproxen you may take extra strength Tylenol for pain control every 6 hours as needed.  You may also take Robaxin, muscle relaxer as needed every 8 hours.  Warm moist heat may feel good.  You may try applying this.  2.  Schedule follow-up with your doctor in the next 3 to 5 days.  Return to the emergency department immediately if you get numbness weakness in your arm, suddenly worsening pain or headache or other concerning changes.
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
## 2023-01-23 NOTE — ED Notes (Signed)
Pt discharged to home, NAD noted. Pt reports c-collar relieves pressure on neck and would like to continue to wear. MD in agreement.
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
## 2023-02-02 ENCOUNTER — Ambulatory Visit: Payer: Medicaid Other | Admitting: Student

## 2023-02-08 ENCOUNTER — Other Ambulatory Visit: Payer: Self-pay | Admitting: Obstetrics & Gynecology

## 2023-02-08 ENCOUNTER — Other Ambulatory Visit (HOSPITAL_COMMUNITY): Payer: Self-pay

## 2023-02-08 ENCOUNTER — Other Ambulatory Visit: Payer: Self-pay

## 2023-02-09 ENCOUNTER — Other Ambulatory Visit: Payer: Self-pay

## 2023-02-09 ENCOUNTER — Other Ambulatory Visit (HOSPITAL_COMMUNITY): Payer: Self-pay

## 2023-02-09 MED ORDER — CYCLOBENZAPRINE HCL 10 MG PO TABS
10.0000 mg | ORAL_TABLET | Freq: Three times a day (TID) | ORAL | 0 refills | Status: DC | PRN
  Filled 2023-02-09: qty 15, 5d supply, fill #0

## 2023-02-10 ENCOUNTER — Other Ambulatory Visit (HOSPITAL_COMMUNITY): Payer: Self-pay

## 2023-02-13 ENCOUNTER — Encounter: Payer: Self-pay | Admitting: *Deleted

## 2023-02-14 ENCOUNTER — Ambulatory Visit (HOSPITAL_COMMUNITY)
Admission: RE | Admit: 2023-02-14 | Discharge: 2023-02-14 | Disposition: A | Discharge status: Home / Self Care | Payer: Medicaid Other | Source: Ambulatory Visit | Attending: Internal Medicine | Admitting: Internal Medicine

## 2023-02-14 ENCOUNTER — Encounter: Payer: Self-pay | Admitting: Internal Medicine

## 2023-02-14 ENCOUNTER — Ambulatory Visit (INDEPENDENT_AMBULATORY_CARE_PROVIDER_SITE_OTHER): Payer: Medicaid Other | Admitting: Internal Medicine

## 2023-02-14 ENCOUNTER — Other Ambulatory Visit (HOSPITAL_COMMUNITY): Payer: Self-pay

## 2023-02-14 VITALS — BP 112/78 | HR 104 | Temp 98.2°F | Ht 66.0 in | Wt 220.0 lb

## 2023-02-14 DIAGNOSIS — M79604 Pain in right leg: Secondary | ICD-10-CM

## 2023-02-14 DIAGNOSIS — Z86711 Personal history of pulmonary embolism: Secondary | ICD-10-CM | POA: Diagnosis present

## 2023-02-14 DIAGNOSIS — M79605 Pain in left leg: Secondary | ICD-10-CM

## 2023-02-14 LAB — COMPREHENSIVE METABOLIC PANEL
ALT: 16 U/L (ref 0–35)
AST: 18 U/L (ref 0–37)
Albumin: 4.4 g/dL (ref 3.5–5.2)
Alkaline Phosphatase: 44 U/L (ref 39–117)
BUN: 7 mg/dL (ref 6–23)
CO2: 30 mEq/L (ref 19–32)
Calcium: 9.4 mg/dL (ref 8.4–10.5)
Chloride: 101 mEq/L (ref 96–112)
Creatinine, Ser: 0.69 mg/dL (ref 0.40–1.20)
GFR: 110.57 mL/min (ref >60.00)
Glucose, Bld: 77 mg/dL (ref 70–99)
Potassium: 4.1 mEq/L (ref 3.5–5.1)
Sodium: 138 mEq/L (ref 135–145)
Total Bilirubin: 0.4 mg/dL (ref 0.2–1.2)
Total Protein: 7.3 g/dL (ref 6.0–8.3)

## 2023-02-14 LAB — CBC WITH DIFFERENTIAL/PLATELET
Basophils Absolute: 0 10*3/uL (ref 0.0–0.1)
Basophils Relative: 0.5 % (ref 0.0–3.0)
Eosinophils Absolute: 0.2 10*3/uL (ref 0.0–0.7)
Eosinophils Relative: 2.7 % (ref 0.0–5.0)
HCT: 38.6 % (ref 36.0–46.0)
Hemoglobin: 13.4 g/dL (ref 12.0–15.0)
Lymphocytes Relative: 28.2 % (ref 12.0–46.0)
Lymphs Abs: 2.2 10*3/uL (ref 0.7–4.0)
MCHC: 34.7 g/dL (ref 30.0–36.0)
MCV: 88.3 fl (ref 78.0–100.0)
Monocytes Absolute: 0.6 10*3/uL (ref 0.1–1.0)
Monocytes Relative: 7.3 % (ref 3.0–12.0)
Neutro Abs: 4.9 10*3/uL (ref 1.4–7.7)
Neutrophils Relative %: 61.3 % (ref 43.0–77.0)
Platelets: 261 10*3/uL (ref 150.0–400.0)
RBC: 4.38 Mil/uL (ref 3.87–5.11)
RDW: 13.7 % (ref 11.5–15.5)
WBC: 7.9 10*3/uL (ref 4.0–10.5)

## 2023-02-14 LAB — D-DIMER, QUANTITATIVE: D-Dimer, Quant: 1.21 mcg/mL FEU — ABNORMAL HIGH (ref <0.50)

## 2023-02-14 MED ORDER — HYDROCODONE-ACETAMINOPHEN 5-325 MG PO TABS
1.0000 | ORAL_TABLET | Freq: Four times a day (QID) | ORAL | 0 refills | Status: DC | PRN
  Filled 2023-02-14: qty 20, 5d supply, fill #0

## 2023-02-14 NOTE — Assessment & Plan Note (Addendum)
New  R/o DVT Venous Doppler US B D-dimer stat Start Xarelto 15 mg bid - samples  Pain and swelling in the right leg with clinical symptoms of DVT. D-dimer was elevated at 1.21. Doppler ultrasound was negative. History of pulmonary embolism (without blood clot in the legs confirmation). Started on Xarelto 15 mg twice daily for now. Need to help [place] decide what to do in the long run.

## 2023-02-14 NOTE — Assessment & Plan Note (Addendum)
New  R/o DVT Venous Doppler US B D-dimer stat Start Xarelto 15 mg bid - samples Norco prn

## 2023-02-14 NOTE — Assessment & Plan Note (Addendum)
>>  ASSESSMENT AND PLAN FOR LEG HEAVINESS WRITTEN ON 02/21/2023  3:04 PM BY Yidel Teuscher, MEDICAL STUDENT  >>ASSESSMENT AND PLAN FOR LEFT LEG PAIN WRITTEN ON 02/14/2023  2:10 PM BY PLOTNIKOV, ALEKSEI V, MD  New  R/o DVT Venous Doppler US B D-dimer stat Start Xarelto 15 mg bid - samples Norco prn  >>ASSESSMENT AND PLAN FOR RIGHT LEG PAIN WRITTEN ON 02/15/2023 12:00 AM BY PLOTNIKOV, Georgina Quint, MD  New  R/o DVT Venous Doppler US B D-dimer stat Start Xarelto 15 mg bid - samples  Pain and swelling in the right leg with clinical symptoms of DVT. D-dimer was elevated at 1.21. Doppler ultrasound was negative. History of pulmonary embolism (without blood clot in the legs confirmation). Started on Xarelto 15 mg twice daily for now. Need to help [place] decide what to do in the long run.

## 2023-02-14 NOTE — Progress Notes (Addendum)
Subjective:  Patient ID: Meghan Reed, female    DOB: [birth year]  Age: 37 y.o. MRN: [id number]  CC: No chief complaint on file.   HPI Meghan Reed presents for R calf since Sunday - worse since last night Pain is 7/10, very tight. L leg is tight too.  Remote history of pulmonary embolism.  Outpatient Medications Prior to Visit  Medication Sig Dispense Refill   Galcanezumab-gnlm (EMGALITY) 120 MG/ML SOAJ Inject 120 mg into the skin every 30 days. 3 mL 3   omeprazole (PRILOSEC OTC) 20 MG tablet Take 1 tablet (20 mg total) by mouth daily. (Patient taking differently: Take 20 mg by mouth daily as needed (acid reflux).) 30 tablet 3   ondansetron (ZOFRAN) 4 MG tablet Take 1 tablet (4 mg total) by mouth every 8 (eight) hours as needed for nausea or vomiting. 20 tablet 0   pseudoephedrine (SUDAFED) 120 MG 12 hr tablet Take 1 tablet (120 mg total) by mouth 2 (two) times daily as needed for congestion. 60 tablet 1   Ubrogepant (UBRELVY) 100 MG TABS Take 1 tablet by mouth daily as needed. (Patient taking differently: Take 1 tablet by mouth daily as needed (migraine).) 8 tablet 11   albuterol (PROVENTIL) (2.5 MG/3ML) 0.083% nebulizer solution Take 3 mLs (2.5 mg total) by nebulization every 6 (six) hours as needed for wheezing or shortness of breath. 150 mL 1   albuterol (VENTOLIN HFA) 108 (90 Base) MCG/ACT inhaler Inhale 2 puffs into the lungs every 4 (four) hours as needed for wheezing or shortness of breath. 18 g 6   budesonide-formoterol (SYMBICORT) 160-4.5 MCG/ACT inhaler Inhale 2 puffs into the lungs 2 (two) times daily. 10.2 g 11   cyclobenzaprine (FLEXERIL) 10 MG tablet Take 1 tablet (10 mg total) by mouth every 8 (eight) hours as needed for muscle spasms. 15 tablet 0   methocarbamol (ROBAXIN-750) 750 MG tablet Take 1 tablet (750 mg total) by mouth every 8 (eight) hours as needed for muscle spasms. (Patient not taking: Reported on 02/14/2023) 30 tablet 0    naproxen (NAPROSYN) 375 MG tablet Take 1 tablet (375 mg total) by mouth 2 (two) times daily as needed. (Patient not taking: Reported on 02/14/2023) 20 tablet 0   No facility-administered medications prior to visit.    ROS: Review of Systems  Constitutional:  Positive for fatigue. Negative for activity change, appetite change, chills and unexpected weight change.  HENT:  Negative for congestion, mouth sores and sinus pressure.   Eyes:  Negative for visual disturbance.  Respiratory:  Negative for cough and chest tightness.   Cardiovascular:  Positive for leg swelling.  Gastrointestinal:  Negative for abdominal pain and nausea.  Genitourinary:  Negative for difficulty urinating, frequency and vaginal pain.  Musculoskeletal:  Positive for gait problem. Negative for back pain.  Skin:  Negative for pallor and rash.  Neurological:  Negative for dizziness, tremors, weakness, numbness and headaches.  Psychiatric/Behavioral:  Negative for confusion and sleep disturbance.     Objective:  BP 112/78 (BP Location: Left Arm, Patient Position: Sitting, Cuff Size: Normal)   Pulse (!) 104   Temp 98.2 F (36.8 C) (Oral)   Ht  (1.676 m)   Wt 220 lb (99.8 kg)   LMP  (LMP Unknown)   SpO2 98%   BMI 35.51 kg/m   BP Readings from Last 3 Encounters:  02/14/23 112/78  01/23/23 121/86  12/12/22 112/76    Wt Readings from Last 3 Encounters:  02/14/23 220 lb (  99.8 kg)  01/23/23 220 lb 7.4 oz (100 kg)  12/12/22 220 lb 6.4 oz (100 kg)    Physical Exam Constitutional:      General: She is not in acute distress.    Appearance: She is well-developed.  HENT:     Head: Normocephalic.     Right Ear: External ear normal.     Left Ear: External ear normal.     Nose: Nose normal.  Eyes:     General:        Right eye: No discharge.        Left eye: No discharge.     Conjunctiva/sclera: Conjunctivae normal.     Pupils: Pupils are equal, round, and reactive to light.  Neck:     Thyroid: No  thyromegaly.     Vascular: No JVD.     Trachea: No tracheal deviation.  Cardiovascular:     Rate and Rhythm: Normal rate and regular rhythm.     Heart sounds: Normal heart sounds.  Pulmonary:     Effort: No respiratory distress.     Breath sounds: No stridor. No wheezing.  Abdominal:     General: Bowel sounds are normal. There is no distension.     Palpations: Abdomen is soft. There is no mass.     Tenderness: There is no abdominal tenderness. There is no guarding or rebound.  Musculoskeletal:        General: No tenderness.     Cervical back: Normal range of motion and neck supple. No rigidity.     Right lower leg: Edema present.     Left lower leg: Edema present.  Lymphadenopathy:     Cervical: No cervical adenopathy.  Skin:    Findings: No erythema or rash.  Neurological:     Cranial Nerves: No cranial nerve deficit.     Motor: No abnormal muscle tone.     Coordination: Coordination normal.     Deep Tendon Reflexes: Reflexes normal.  Psychiatric:        Behavior: Behavior normal.        Thought Content: Thought content normal.        Judgment: Judgment normal.   R calf w/pain.  Antalgic gait.  Limping Trace edema B A couple of small bruises on the R shin - not new No skin colour or skin temperature change  Lab Results  Component Value Date   WBC 7.9 02/14/2023   HGB 13.4 02/14/2023   HCT 38.6 02/14/2023   PLT 261.0 02/14/2023   GLUCOSE 77 02/14/2023   CHOL 234 (H) 08/16/2021   TRIG 78 08/16/2021   HDL 55 08/16/2021   LDLCALC 165 (H) 08/16/2021   ALT 16 02/14/2023   AST 18 02/14/2023   NA 138 02/14/2023   K 4.1 02/14/2023   CL 101 02/14/2023   CREATININE 0.69 02/14/2023   BUN 7 02/14/2023   CO2 30 02/14/2023   TSH 0.909 12/21/2021   INR 1.5 (H) 10/31/2019   HGBA1C 5.2 08/12/2019    CT Cervical Spine Wo Contrast  Result Date: 01/23/2023 CLINICAL DATA:  Neck trauma, midline tenderness (Age 68-64y) EXAM: CT CERVICAL SPINE WITHOUT CONTRAST TECHNIQUE:  Multidetector CT imaging of the cervical spine was performed without intravenous contrast. Multiplanar CT image reconstructions were also generated. RADIATION DOSE REDUCTION: This exam was performed according to the departmental dose-optimization program which includes automated exposure control, adjustment of the mA and/or kV according to patient size and/or use of iterative reconstruction technique. COMPARISON:  MRI cervical  spine 08/31/2021 FINDINGS: Alignment: Normal. Skull base and vertebrae: No acute fracture. No aggressive appearing focal osseous lesion or focal pathologic process. Soft tissues and spinal canal: No prevertebral fluid or swelling. No visible canal hematoma. Upper chest: Unremarkable. Other: None. IMPRESSION: No acute displaced fracture or traumatic listhesis of the cervical spine. Electronically Signed   By: Tish Frederickson M.D.   On: 01/23/2023 18:53    Assessment & Plan:   Problem List Items Addressed This Visit       Other   History of pulmonary embolism - Primary   Relevant Orders   D-dimer, quantitative (Completed)   Comprehensive metabolic panel (Completed)   CBC with Differential/Platelet (Completed)   VAS [place] LOWER EXTREMITY VENOUS (DVT) (Completed)   Ambulatory referral to Hematology / Oncology   Left leg pain    New  R/o DVT Venous Doppler US B D-dimer stat Start Xarelto 15 mg bid - samples Norco prn      Relevant Orders   VAS [place] LOWER EXTREMITY VENOUS (DVT) (Completed)   Ambulatory referral to Hematology / Oncology   Right leg pain    New  R/o DVT Venous Doppler US B D-dimer stat Start Xarelto 15 mg bid - samples  Pain and swelling in the right leg with clinical symptoms of DVT. D-dimer was elevated at 1.21. Doppler ultrasound was negative. History of pulmonary embolism (without blood clot in the legs confirmation). Started on Xarelto 15 mg twice daily for now. Need to help [place] decide what to do in the long run.        Relevant Orders   D-dimer,  quantitative (Completed)   Comprehensive metabolic panel (Completed)   CBC with Differential/Platelet (Completed)   VAS [place] LOWER EXTREMITY VENOUS (DVT) (Completed)   Ambulatory referral to Hematology / Oncology      Meds ordered this encounter  Medications   HYDROcodone-acetaminophen (NORCO) 5-325 MG tablet    Sig: Take 1 tablet by mouth every 6 (six) hours as needed for moderate pain.    Dispense:  20 tablet    Refill:  0      Follow-up: No follow-ups on file.  Sonda Primes, MD

## 2023-02-15 ENCOUNTER — Other Ambulatory Visit: Payer: Self-pay | Admitting: Hematology and Oncology

## 2023-02-15 ENCOUNTER — Telehealth: Payer: Self-pay | Admitting: Hematology and Oncology

## 2023-02-15 DIAGNOSIS — D500 Iron deficiency anemia secondary to blood loss (chronic): Secondary | ICD-10-CM

## 2023-02-15 NOTE — Telephone Encounter (Signed)
Reached out to make her aware I put her labs before her appointment as per 4/17 IB, patient aware.

## 2023-02-15 NOTE — Progress Notes (Unsigned)
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  She has been taking her iron prescription as prescribed and does not currently have any symptoms of recurrent VTE.    # Elevated D-Dimer and Calf Pain -- Patient had negative ultrasound, no suspicion for lower extremity DVTs. -- Pain is in the calves bilaterally.  Would recommend checking iron studies. -- Can consider vascular evaluation to ensure this is not a circulatory issue. --  D-dimer is highly nonspecific and could be increased for any number of reasons including inflammation, infection, or other process.  #Pulmonary Embolism in  Setting of Pregnancy --patient continued lovenox until 12/12/2019 (6 weeks post partum). Completed full course of anticoagulation --no further indication for anticoagulation. Patient had a tubal ligation, no anticipated pregnancies in the future.   --RTC PRN   #Anemia in the Post Partum Period, resolved.  #Peripartum Hemorrhage --Labs today show white blood cell count 6.2, hemoglobin 13.0, MCV 88.2, and platelets 228 --continue to monitor   #Thrombocytopenia s/p Post Partum Hemorrhage, resolved.  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  All questions were answered. The patient knows to call the clinic with any problems, questions or concerns.  A total of more than 40 minutes were spent on this encounter and over half of that time was spent on counseling and coordination of care as outlined above.   Ulysees Barns, MD Department of Hematology/Oncology [facility] Phone: [phone] Pager: [phone] Email: [email or web address]  02/16/2023 5:23 PM

## 2023-02-15 NOTE — Addendum Note (Signed)
Addended by: Tresa Garter on: 02/15/2023 12:01 AM   Modules accepted: Orders, Level of Service

## 2023-02-16 ENCOUNTER — Ambulatory Visit: Payer: Medicaid Other | Admitting: Hematology and Oncology

## 2023-02-16 ENCOUNTER — Other Ambulatory Visit: Payer: Self-pay

## 2023-02-16 ENCOUNTER — Inpatient Hospital Stay: Payer: Medicaid Other

## 2023-02-16 ENCOUNTER — Inpatient Hospital Stay: Payer: Medicaid Other | Attending: Hematology and Oncology | Admitting: Hematology and Oncology

## 2023-02-16 ENCOUNTER — Other Ambulatory Visit (HOSPITAL_COMMUNITY): Payer: Self-pay

## 2023-02-16 ENCOUNTER — Other Ambulatory Visit: Payer: Medicaid Other

## 2023-02-16 VITALS — BP 110/76 | HR 79 | Temp 97.9°F | Resp 16 | Wt 221.4 lb

## 2023-02-16 DIAGNOSIS — Z862 Personal history of diseases of the blood and blood-forming organs and certain disorders involving the immune mechanism: Secondary | ICD-10-CM | POA: Insufficient documentation

## 2023-02-16 DIAGNOSIS — M79662 Pain in left lower leg: Secondary | ICD-10-CM | POA: Diagnosis present

## 2023-02-16 DIAGNOSIS — Z86718 Personal history of other venous thrombosis and embolism: Secondary | ICD-10-CM | POA: Insufficient documentation

## 2023-02-16 DIAGNOSIS — Z87891 Personal history of nicotine dependence: Secondary | ICD-10-CM | POA: Insufficient documentation

## 2023-02-16 DIAGNOSIS — D500 Iron deficiency anemia secondary to blood loss (chronic): Secondary | ICD-10-CM

## 2023-02-16 DIAGNOSIS — M79661 Pain in right lower leg: Secondary | ICD-10-CM | POA: Insufficient documentation

## 2023-02-16 DIAGNOSIS — Z800 Family history of malignant neoplasm of digestive organs: Secondary | ICD-10-CM | POA: Insufficient documentation

## 2023-02-16 DIAGNOSIS — Z801 Family history of malignant neoplasm of trachea, bronchus and lung: Secondary | ICD-10-CM | POA: Diagnosis not present

## 2023-02-16 DIAGNOSIS — Z808 Family history of malignant neoplasm of other organs or systems: Secondary | ICD-10-CM | POA: Diagnosis not present

## 2023-02-16 DIAGNOSIS — Z86711 Personal history of pulmonary embolism: Secondary | ICD-10-CM | POA: Insufficient documentation

## 2023-02-16 DIAGNOSIS — Z79899 Other long term (current) drug therapy: Secondary | ICD-10-CM | POA: Insufficient documentation

## 2023-02-16 DIAGNOSIS — Z803 Family history of malignant neoplasm of breast: Secondary | ICD-10-CM | POA: Insufficient documentation

## 2023-02-16 LAB — CMP (CANCER CENTER ONLY)
ALT: 15 U/L (ref 0–44)
AST: 14 U/L — ABNORMAL LOW (ref 15–41)
Albumin: 4.2 g/dL (ref 3.5–5.0)
Alkaline Phosphatase: 45 U/L (ref 38–126)
Anion gap: 5 (ref 5–15)
BUN: 8 mg/dL (ref 6–20)
CO2: 28 mmol/L (ref 22–32)
Calcium: 9.5 mg/dL (ref 8.9–10.3)
Chloride: 104 mmol/L (ref 98–111)
Creatinine: 0.74 mg/dL (ref 0.44–1.00)
GFR, Estimated: >60 mL/min (ref >60)
Glucose, Bld: 87 mg/dL (ref 70–99)
Potassium: 4.4 mmol/L (ref 3.5–5.1)
Sodium: 137 mmol/L (ref 135–145)
Total Bilirubin: 0.5 mg/dL (ref 0.3–1.2)
Total Protein: 7.1 g/dL (ref 6.5–8.1)

## 2023-02-16 LAB — CBC WITH DIFFERENTIAL (CANCER CENTER ONLY)
Abs Immature Granulocytes: 0.02 10*3/uL (ref 0.00–0.07)
Basophils Absolute: 0 10*3/uL (ref 0.0–0.1)
Basophils Relative: 1 %
Eosinophils Absolute: 0.1 10*3/uL (ref 0.0–0.5)
Eosinophils Relative: 2 %
HCT: 37.5 % (ref 36.0–46.0)
Hemoglobin: 13 g/dL (ref 12.0–15.0)
Immature Granulocytes: 0 %
Lymphocytes Relative: 30 %
Lymphs Abs: 1.9 10*3/uL (ref 0.7–4.0)
MCH: 30.6 pg (ref 26.0–34.0)
MCHC: 34.7 g/dL (ref 30.0–36.0)
MCV: 88.2 fL (ref 80.0–100.0)
Monocytes Absolute: 0.4 10*3/uL (ref 0.1–1.0)
Monocytes Relative: 6 %
Neutro Abs: 3.8 10*3/uL (ref 1.7–7.7)
Neutrophils Relative %: 61 %
Platelet Count: 228 10*3/uL (ref 150–400)
RBC: 4.25 MIL/uL (ref 3.87–5.11)
RDW: 12.4 % (ref 11.5–15.5)
WBC Count: 6.2 10*3/uL (ref 4.0–10.5)
nRBC: 0 % (ref 0.0–0.2)

## 2023-02-16 LAB — RETIC PANEL
Immature Retic Fract: 11.7 % (ref 2.3–15.9)
RBC.: 4.16 MIL/uL (ref 3.87–5.11)
Retic Count, Absolute: 74.9 10*3/uL (ref 19.0–186.0)
Retic Ct Pct: 1.8 % (ref 0.4–3.1)
Reticulocyte Hemoglobin: 34.8 pg (ref >27.9)

## 2023-02-16 LAB — IRON AND IRON BINDING CAPACITY (CC-WL,HP ONLY)
Iron: 87 ug/dL (ref 28–170)
Saturation Ratios: 27 % (ref 10.4–31.8)
TIBC: 325 ug/dL (ref 250–450)
UIBC: 238 ug/dL (ref 148–442)

## 2023-02-16 LAB — FERRITIN: Ferritin: 35 ng/mL (ref 11–307)

## 2023-02-16 MED ORDER — FERROUS SULFATE 325 (65 FE) MG PO TABS
325.0000 mg | ORAL_TABLET | Freq: Every day | ORAL | 3 refills | Status: DC
  Filled 2023-02-16 – 2024-01-16 (×3): qty 90, 90d supply, fill #0

## 2023-02-21 ENCOUNTER — Ambulatory Visit (INDEPENDENT_AMBULATORY_CARE_PROVIDER_SITE_OTHER): Payer: Medicaid Other | Admitting: Student

## 2023-02-21 ENCOUNTER — Encounter: Payer: Self-pay | Admitting: Student

## 2023-02-21 VITALS — BP 128/84 | HR 86 | Ht 66.0 in | Wt 223.0 lb

## 2023-02-21 DIAGNOSIS — E7800 Pure hypercholesterolemia, unspecified: Secondary | ICD-10-CM | POA: Diagnosis not present

## 2023-02-21 DIAGNOSIS — R7309 Other abnormal glucose: Secondary | ICD-10-CM

## 2023-02-21 DIAGNOSIS — Z6835 Body mass index (BMI) 35.0-35.9, adult: Secondary | ICD-10-CM

## 2023-02-21 DIAGNOSIS — R29898 Other symptoms and signs involving the musculoskeletal system: Secondary | ICD-10-CM

## 2023-02-21 LAB — POCT GLYCOSYLATED HEMOGLOBIN (HGB A1C): Hemoglobin A1C: 5 % (ref 4.0–5.6)

## 2023-02-21 NOTE — Assessment & Plan Note (Signed)
Pt reports difficulty losing weight despite dietary changes and regular exercise. Discussed how weight loss is primarily diet over exercise. - Recommend pt use Cronometer, an app for tracking dietary habits, and provided some goals - Encouraged continued exercise - Will follow up in 4-6 weeks to reassess
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
## 2023-02-21 NOTE — Assessment & Plan Note (Signed)
For the past week, she reports leg heaviness primarily in her lower leg/calf region bilaterally. Reports associated edema. DVT previously ruled out by BLE dopplers. Concern for neurological issues versus ruptured Baker cyst; most likely ruptured Baker cyst given history of Baker cyst with left popliteal fossa tenderness on exam alongside normal MSK BLE exam. - No further workup indicated at this time. Pt can remain off Xarelto given no concern for DVT - Will follow up in 4-6 weeks to assess for resolution of symptoms

## 2023-02-21 NOTE — Patient Instructions (Addendum)
Ms. Tiner,  So great to see you!  As we talked about, I am pretty sure that the heaviness in your legs is secondary to a ruptured Bakers cyst.  I am not concerned that you are having any clotting.  You can stop taking the Xarelto.  Obviously, continue to monitor this and if you develop new or worsening symptoms, do not hesitate to mention it to Dr. Demetrius Charity or to come back and see [place].  For your weight loss, lets try some tracking.  The app that I recommend is "Cronometer" --  For some starting diet goals, lets start with 30g fiber / day and at least 175g protein daily.  This will likely feel like a challenge for you, especially at first, that is a good thing. If you find that you are having a hard time reaching her protein goal through [employer] alone, a protein powder may be a good choice especially to augment your work in the gym.  1 brand that I recommend is Isopure protein powder.  It is 0 carb which is nice.  We are going to check some labs today, 1 of these labs your HOMA-IR score will tell us whether you might qualify for [facility] through your insurance.   Eliezer Mccoy, MD

## 2023-02-21 NOTE — Progress Notes (Deleted)
    SUBJECTIVE:   CHIEF COMPLAINT / HPI:   Elevated D-dimer to 1.21, LE Doppler w/o clots, was placed on Xarelto by Dr. Posey Rea. Heme said okay to stop Xarelto.   LDL 165 2 years ago, not presently on lipid-loweing therapy.   PERTINENT  PMH / PSH: History of PE in pregnancy, no longer anticoagulated; Hx of IDA, not present at last lab visit last week;   OBJECTIVE:   LMP  (LMP Unknown)   ***  ASSESSMENT/PLAN:   No problem-specific Assessment & Plan notes found for this encounter.     Eliezer Mccoy, MD [facility]

## 2023-02-21 NOTE — Progress Notes (Signed)
    SUBJECTIVE:   CHIEF COMPLAINT / HPI:   Meghan Reed is a 37 y.o. female who presents today for leg heaviness and weight concern.  Leg Heaviness Reports issues with legs since pregnancy. She has a history of intermittent cramping, both at night and occasionally with exertion. She also reports her legs "fall asleep" at night.  She now reports that, for the past week, she's had a "heavy" feeling in her bilateral legs. Reports BLE edema associated with this heaviness, along with some pain primarily in her calves. Had previously used compression stockings, which helped with her swelling. Elevated D-dimer to 1.21 on 02/14/2023, followed by negative BLE dopplers on same day. Was placed on Xarelto by Dr. Posey Rea, then saw heme who okayed stopping Xarelto. Heme-onc believes her leg issues are related to possible circulatory issue vs Baker's Cyst. She does have a history of Baker's Cyst. Reports longstanding history of lower back pain, no recent changes. No issues with BMs, urination. Regularly active via walking at lunch.  Weight Concerns Reports gaining weight despite maintaining better eating habits and working out more. Chart review shows largely stable weight with some mild fluctuation for years. Doesn't eat red meat, no fried foods, some sweets. Drinks pepsi a couple times a week. Mainly drinks water.  Of note, works in [employer]. Hx of hysterecomy in 2023.  PERTINENT  PMH / PSH: History of PE in pregnancy, no longer anticoagulated; Hx of IDA in pregnancy, not present at last lab visit last week  OBJECTIVE:   BP 128/84   Pulse 86   Ht  (1.676 m)   Wt 223 lb (101.2 kg)   LMP  (LMP Unknown)   SpO2 99%   BMI 35.99 kg/m   General: Pt seated in chair, NAD. Cardiovascular: RRR, no murmurs, rubs, gallops. Pulmonary: Normal work of breathing. Lungs clear to auscultation bilaterally. MSK: Strength 5/5 in bilateral lower extremities. Sensation intact bilaterally.  2+ knee, ankle reflexes bilaterally. Minimal edema bilaterally. Some tenderness to palpation of L popliteal fossa, slightly more edematous than R popliteal fossa Neuro/Psych: A&Ox3. Normal affect.  Ha1c (02/21/23): 5.0%  ASSESSMENT/PLAN:   Leg heaviness For the past week, she reports leg heaviness primarily in her lower leg/calf region bilaterally. Reports associated edema. DVT previously ruled out by BLE dopplers. Concern for neurological issues versus ruptured Baker cyst; most likely ruptured Baker cyst given history of Baker cyst with left popliteal fossa tenderness on exam alongside normal MSK BLE exam. Considered RLS but with ferritin WNL this is less likely.  - No further workup indicated at this time. Pt can remain off Xarelto given no concern for DVT - Will follow up in 4-6 weeks to assess for resolution of symptoms  BMI 35.0-35.9,adult Pt reports difficulty losing weight despite dietary changes and regular exercise. Discussed how weight loss is primarily diet over exercise. - Recommend pt use Cronometer, an app for tracking dietary habits, and provided some goals: 30g fiber/day and at least 175g protein daily as a good starting place. Expect these will be challenging but attainable for her.  - Encouraged continued exercise - Will follow up in 4-6 weeks to reassess     Governor Rooks, Medical Student [place] [facility]     I have evaluated this patient along with medical student Governor Rooks and reviewed the above note, making necessary revisions.  Dorothyann Gibbs, MD 02/21/2023, 3:19 PM PGY-2, [facility]

## 2023-02-22 LAB — LIPID PANEL
Chol/HDL Ratio: 4.2 ratio (ref 0.0–4.4)
Cholesterol, Total: 237 mg/dL — ABNORMAL HIGH (ref 100–199)
HDL: 57 mg/dL (ref >39)
LDL Chol Calc (NIH): 150 mg/dL — ABNORMAL HIGH (ref 0–99)
Triglycerides: 165 mg/dL — ABNORMAL HIGH (ref 0–149)
VLDL Cholesterol Cal: 30 mg/dL (ref 5–40)

## 2023-02-22 LAB — BASIC METABOLIC PANEL
BUN/Creatinine Ratio: 10 (ref 9–23)
BUN: 7 mg/dL (ref 6–20)
CO2: 23 mmol/L (ref 20–29)
Calcium: 9.4 mg/dL (ref 8.7–10.2)
Chloride: 104 mmol/L (ref 96–106)
Creatinine, Ser: 0.73 mg/dL (ref 0.57–1.00)
Glucose: 82 mg/dL (ref 70–99)
Potassium: 4 mmol/L (ref 3.5–5.2)
Sodium: 141 mmol/L (ref 134–144)
eGFR: 109 mL/min/{1.73_m2} (ref >59)

## 2023-02-22 LAB — INSULIN, RANDOM: INSULIN: 66 u[IU]/mL — ABNORMAL HIGH (ref 2.6–24.9)

## 2023-02-23 ENCOUNTER — Encounter: Payer: Self-pay | Admitting: Student

## 2023-02-23 ENCOUNTER — Ambulatory Visit (INDEPENDENT_AMBULATORY_CARE_PROVIDER_SITE_OTHER): Payer: Medicaid Other | Admitting: Student

## 2023-02-23 VITALS — BP 112/70 | HR 77 | Temp 98.7°F | Ht 66.0 in | Wt 226.0 lb

## 2023-02-23 DIAGNOSIS — R053 Chronic cough: Secondary | ICD-10-CM | POA: Diagnosis not present

## 2023-02-23 NOTE — Patient Instructions (Addendum)
-   symbicort 2 puffs twice daily rinse mouth and brush teeth/tongue after each use. Would continue to use it at least through pollen season. If you're doing well after that and not needing albuterol more than 2x weekly or 1-2x overnight per month then ok to stop it and use it only as needed for cough/chest tightness/wheeze/shortness of breath for 3-4 days at a time. Let us know if you need to restart it and we'd be glad to see you in the office again. - albuterol as needed - see you as needed

## 2023-03-01 ENCOUNTER — Other Ambulatory Visit (HOSPITAL_COMMUNITY): Payer: Self-pay

## 2023-03-01 ENCOUNTER — Encounter: Payer: Self-pay | Admitting: Student

## 2023-03-01 DIAGNOSIS — E88819 Insulin resistance, unspecified: Secondary | ICD-10-CM

## 2023-03-01 MED ORDER — SEMAGLUTIDE(0.25 OR 0.5MG/DOS) 2 MG/3ML ~~LOC~~ SOPN
0.2500 mg | PEN_INJECTOR | SUBCUTANEOUS | 0 refills | Status: DC
Start: 2023-03-01 — End: 2023-04-11
  Filled 2023-03-01: qty 3, 28d supply, fill #0

## 2023-03-03 ENCOUNTER — Telehealth: Payer: Self-pay

## 2023-03-03 NOTE — Telephone Encounter (Signed)
A Prior Authorization was initiated for this patients [employer]IC through CoverMyMeds.   Key: [id number]

## 2023-03-06 ENCOUNTER — Other Ambulatory Visit (HOSPITAL_COMMUNITY): Payer: Self-pay

## 2023-03-06 NOTE — Telephone Encounter (Signed)
Patient calls nurse line reporting she got an email from covermymeds.   She reports she can not view the message.   I tried to sign in with Key information with no luck.   Will forward to [facility] for determination.

## 2023-03-07 ENCOUNTER — Other Ambulatory Visit (HOSPITAL_COMMUNITY): Payer: Self-pay

## 2023-03-07 NOTE — Telephone Encounter (Signed)
Patient advised of denial.

## 2023-03-22 ENCOUNTER — Emergency Department (HOSPITAL_BASED_OUTPATIENT_CLINIC_OR_DEPARTMENT_OTHER)
Admission: EM | Admit: 2023-03-22 | Discharge: 2023-03-22 | Disposition: A | Discharge status: Home / Self Care | Payer: Medicaid Other | Attending: Emergency Medicine | Admitting: Emergency Medicine

## 2023-03-22 ENCOUNTER — Emergency Department (HOSPITAL_BASED_OUTPATIENT_CLINIC_OR_DEPARTMENT_OTHER): Payer: Medicaid Other

## 2023-03-22 ENCOUNTER — Other Ambulatory Visit: Payer: Self-pay

## 2023-03-22 ENCOUNTER — Encounter (HOSPITAL_BASED_OUTPATIENT_CLINIC_OR_DEPARTMENT_OTHER): Payer: Self-pay

## 2023-03-22 DIAGNOSIS — R6883 Chills (without fever): Secondary | ICD-10-CM | POA: Diagnosis not present

## 2023-03-22 DIAGNOSIS — J45909 Unspecified asthma, uncomplicated: Secondary | ICD-10-CM | POA: Insufficient documentation

## 2023-03-22 DIAGNOSIS — R1030 Lower abdominal pain, unspecified: Secondary | ICD-10-CM

## 2023-03-22 DIAGNOSIS — R110 Nausea: Secondary | ICD-10-CM | POA: Insufficient documentation

## 2023-03-22 DIAGNOSIS — R1031 Right lower quadrant pain: Secondary | ICD-10-CM | POA: Insufficient documentation

## 2023-03-22 DIAGNOSIS — R102 Pelvic and perineal pain: Secondary | ICD-10-CM | POA: Insufficient documentation

## 2023-03-22 LAB — CBC
HCT: 38.3 % (ref 36.0–46.0)
Hemoglobin: 13.2 g/dL (ref 12.0–15.0)
MCH: 30.2 pg (ref 26.0–34.0)
MCHC: 34.5 g/dL (ref 30.0–36.0)
MCV: 87.6 fL (ref 80.0–100.0)
Platelets: 221 10*3/uL (ref 150–400)
RBC: 4.37 MIL/uL (ref 3.87–5.11)
RDW: 12.4 % (ref 11.5–15.5)
WBC: 8.8 10*3/uL (ref 4.0–10.5)
nRBC: 0 % (ref 0.0–0.2)

## 2023-03-22 LAB — URINALYSIS, ROUTINE W REFLEX MICROSCOPIC
Bacteria, UA: NONE SEEN
Bilirubin Urine: NEGATIVE
Glucose, UA: NEGATIVE mg/dL
Ketones, ur: 15 mg/dL — AB
Leukocytes,Ua: NEGATIVE
Nitrite: NEGATIVE
Specific Gravity, Urine: 1.026 (ref 1.005–1.030)
pH: 5.5 (ref 5.0–8.0)

## 2023-03-22 LAB — COMPREHENSIVE METABOLIC PANEL
ALT: 13 U/L (ref 0–44)
AST: 14 U/L — ABNORMAL LOW (ref 15–41)
Albumin: 4.3 g/dL (ref 3.5–5.0)
Alkaline Phosphatase: 43 U/L (ref 38–126)
Anion gap: 9 (ref 5–15)
BUN: 10 mg/dL (ref 6–20)
CO2: 26 mmol/L (ref 22–32)
Calcium: 9.4 mg/dL (ref 8.9–10.3)
Chloride: 102 mmol/L (ref 98–111)
Creatinine, Ser: 0.84 mg/dL (ref 0.44–1.00)
GFR, Estimated: >60 mL/min (ref >60)
Glucose, Bld: 85 mg/dL (ref 70–99)
Potassium: 3.7 mmol/L (ref 3.5–5.1)
Sodium: 137 mmol/L (ref 135–145)
Total Bilirubin: 0.5 mg/dL (ref 0.3–1.2)
Total Protein: 7 g/dL (ref 6.5–8.1)

## 2023-03-22 LAB — LIPASE, BLOOD: Lipase: 39 U/L (ref 11–51)

## 2023-03-22 MED ORDER — IOHEXOL 300 MG/ML  SOLN
100.0000 mL | Freq: Once | INTRAMUSCULAR | Status: AC | PRN
  Administered 2023-03-22: 100 mL via INTRAVENOUS

## 2023-03-22 MED ORDER — SODIUM CHLORIDE 0.9% FLUSH
3.0000 mL | Freq: Once | INTRAVENOUS | Status: AC
  Administered 2023-03-22: 3 mL via INTRAVENOUS

## 2023-03-22 MED ORDER — FENTANYL CITRATE PF 50 MCG/ML IJ SOSY
50.0000 ug | PREFILLED_SYRINGE | Freq: Once | INTRAMUSCULAR | Status: AC
  Administered 2023-03-22: 50 ug via INTRAVENOUS
  Filled 2023-03-22: qty 1

## 2023-03-22 MED ORDER — SODIUM CHLORIDE 0.9 % IV BOLUS
1000.0000 mL | Freq: Once | INTRAVENOUS | Status: AC
  Administered 2023-03-22: 1000 mL via INTRAVENOUS

## 2023-03-22 MED ORDER — ONDANSETRON HCL 4 MG/2ML IJ SOLN
4.0000 mg | Freq: Once | INTRAMUSCULAR | Status: AC | PRN
  Administered 2023-03-22: 4 mg via INTRAVENOUS
  Filled 2023-03-22: qty 2

## 2023-03-22 MED ORDER — PROMETHAZINE HCL 25 MG PO TABS
25.0000 mg | ORAL_TABLET | Freq: Four times a day (QID) | ORAL | 0 refills | Status: DC | PRN
  Filled 2023-03-22: qty 10, 3d supply, fill #0

## 2023-03-22 NOTE — ED Triage Notes (Addendum)
Patient arrives POV c/o abdominal pain, nausea, and "a metallic taste in her mouth" that started on Monday. Patient states "she feels like she has the flu" but has no respiratory symptoms. Patient reports overall fatigue and "physically drained." Patient states she tested her urine at work and "it had blood in it." Patient works in direct patient care. Patient states she had hysterectomy September 2023.

## 2023-03-22 NOTE — ED Provider Notes (Signed)
[place] [facility]MENT AT [facility] Provider Note   CSN: [id number] Arrival date & time: 03/22/23  4098     History  Chief Complaint  Patient presents with   Abdominal Pain   Nausea   metallic tast in mouth    Meghan Reed is a 37 y.o. female.  37 y.o female with a PMH of Asthma, Anemia presents to the ED with multiple complaints patient reports her symptoms began around Sunday night and Monday, with a metallic taste in her mouth, feels like she cannot get rid of this.  Feels like her spit taste like metal metal, reports she gargle, brush her teeth, no improvement in this.  In addition, she is having some lower abdominal pain which began Sunday as well.  Has had some nausea but no vomiting.  Feels like increased fatigue, drained "my body feels like I got the flu without the body aches ".  She did test her urine at work which did not show any signs of infection.  Her last bowel movement was this morning.  She did try to take some Zofran and ODT, but reports this makes her throw up.  She does have a prior history of stomach ulcer, however reports that pain is now lower pelvic pain.  She did have a partial hysterectomy but still has her ovaries in place and preserved to prevent menopause.  No fever, no vomiting, no chest pain or shortness of breath.  The history is provided by the patient.  Abdominal Pain Associated symptoms: chills and nausea   Associated symptoms: no chest pain, no fever, no shortness of breath and no vomiting        Home Medications Prior to Admission medications   Medication Sig Start Date End Date Taking? Authorizing Provider  promethazine (PHENERGAN) 25 MG tablet Take 1 tablet (25 mg total) by mouth every 6 (six) hours as needed for up to 10 doses for nausea or vomiting. 03/22/23  Yes Real Cona, PA-C  albuterol (PROVENTIL) (2.5 MG/3ML) 0.083% nebulizer solution Take 3 mLs (2.5 mg total) by nebulization every 6 (six) hours as  needed for wheezing or shortness of breath. 10/15/21   Pincus Sanes, MD  albuterol (VENTOLIN HFA) 108 (90 Base) MCG/ACT inhaler Inhale 2 puffs into the lungs every 4 (four) hours as needed for wheezing or shortness of breath. 12/12/22   Omar Person, MD  budesonide-formoterol [facility]) 160-4.5 MCG/ACT inhaler Inhale 2 puffs into the lungs 2 (two) times daily. 12/12/22   Omar Person, MD  cyclobenzaprine (FLEXERIL) 10 MG tablet Take 1 tablet (10 mg total) by mouth every 8 (eight) hours as needed for muscle spasms. 02/09/23   Lazaro Arms, MD  ferrous sulfate 325 (65 FE) MG tablet Take 1 tablet (325 mg total) by mouth daily with breakfast. Please take with a source of Vitamin C 02/16/23   Jaci Standard, MD  Galcanezumab-gnlm [facility]) 120 MG/ML SOAJ Inject 120 mg into the skin every 30 days. 05/17/22   Lomax, Amy, NP  HYDROcodone-acetaminophen (NORCO) 5-325 MG tablet Take 1 tablet by mouth every 6 (six) hours as needed for moderate pain. 02/14/23   Plotnikov, Georgina Quint, MD  omeprazole (PRILOSEC OTC) 20 MG tablet Take 1 tablet (20 mg total) by mouth daily. Patient taking differently: Take 20 mg by mouth daily as needed (acid reflux). 02/25/22   Autry-Lott, Randa Evens, DO  ondansetron (ZOFRAN) 4 MG tablet Take 1 tablet (4 mg total) by mouth every 8 (eight) hours  as needed for nausea or vomiting. 11/22/22   Pincus Sanes, MD  pseudoephedrine (SUDAFED) 120 MG 12 hr tablet Take 1 tablet (120 mg total) by mouth 2 (two) times daily as needed for congestion. 11/29/22 11/29/23  Plotnikov, Georgina Quint, MD  Semaglutide,0.25 or 0.5MG /DOS, 2 MG/3ML SOPN Inject 0.25 mg into the skin once a week. 03/01/23   Alicia Amel, MD  Ubrogepant (UBRELVY) 100 MG TABS Take 1 tablet by mouth daily as needed. Patient taking differently: Take 1 tablet by mouth daily as needed (migraine). 05/17/22   Lomax, Amy, NP      Allergies    Tomato, Aspirin, and Nsaids    Review of Systems   Review of Systems  Constitutional:   Positive for chills. Negative for fever.  Respiratory:  Negative for shortness of breath.   Cardiovascular:  Negative for chest pain.  Gastrointestinal:  Positive for abdominal pain and nausea. Negative for vomiting.  Genitourinary:  Negative for flank pain.  All other systems reviewed and are negative.   Physical Exam Updated Vital Signs BP 121/70   Pulse 80   Temp 98 F (36.7 C)   Resp 18   Ht 5\' 6"  (1.676 m)   Wt 99.8 kg   LMP  (LMP Unknown)   SpO2 100%   BMI 35.51 kg/m  Physical Exam Vitals and nursing note reviewed.  Constitutional:      General: She is not in acute distress.    Appearance: She is well-developed.  HENT:     Head: Normocephalic and atraumatic.     Mouth/Throat:     Pharynx: No oropharyngeal exudate.  Eyes:     Pupils: Pupils are equal, round, and reactive to light.  Cardiovascular:     Rate and Rhythm: Regular rhythm.     Heart sounds: Normal heart sounds.  Pulmonary:     Effort: Pulmonary effort is normal. No respiratory distress.     Breath sounds: Normal breath sounds.  Abdominal:     General: Abdomen is flat. Bowel sounds are normal. There is no distension.     Palpations: Abdomen is soft.     Tenderness: There is abdominal tenderness in the right lower quadrant and suprapubic area. There is guarding. There is no right CVA tenderness or left CVA tenderness.  Musculoskeletal:        General: No tenderness or deformity.     Cervical back: Normal range of motion.     Right lower leg: No edema.     Left lower leg: No edema.  Skin:    General: Skin is warm and dry.  Neurological:     Mental Status: She is alert and oriented to person, place, and time.     ED Results / Procedures / Treatments   Labs (all labs ordered are listed, but only abnormal results are displayed) Labs Reviewed  COMPREHENSIVE METABOLIC PANEL - Abnormal; Notable for the following components:      Result Value   AST 14 (*)    All other components within normal limits   URINALYSIS, ROUTINE W REFLEX MICROSCOPIC - Abnormal; Notable for the following components:   Hgb urine dipstick MODERATE (*)    Ketones, ur 15 (*)    Protein, ur TRACE (*)    All other components within normal limits  LIPASE, BLOOD  CBC    EKG None  Radiology CT ABDOMEN PELVIS W CONTRAST  Result Date: 03/22/2023 CLINICAL DATA:  Right lower quadrant abdominal pain EXAM: CT ABDOMEN AND PELVIS  WITH CONTRAST TECHNIQUE: Multidetector CT imaging of the abdomen and pelvis was performed using the standard protocol following bolus administration of intravenous contrast. RADIATION DOSE REDUCTION: This exam was performed according to the departmental dose-optimization program which includes automated exposure control, adjustment of the mA and/or kV according to patient size and/or use of iterative reconstruction technique. CONTRAST:  OMNIPAQUE IOHEXOL 300 MG/ML  SOLN COMPARISON:  CT examination dated November 23, 2022 FINDINGS: Lower chest: No acute abnormality. Hepatobiliary: Small subcentimeter hypodense cyst in the right hepatic lobe near the diaphragm. No gallstones, gallbladder wall thickening, or biliary dilatation. Pancreas: Unremarkable. No pancreatic ductal dilatation or surrounding inflammatory changes. Spleen: Normal in size without focal abnormality. Adrenals/Urinary Tract: Adrenal glands are unremarkable. Kidneys are normal, without renal calculi, focal lesion, or hydronephrosis. Bladder is unremarkable. Stomach/Bowel: Stomach is within normal limits. Appendix appears normal. No evidence of bowel wall thickening, distention, or inflammatory changes. Vascular/Lymphatic: No significant vascular findings are present. No enlarged abdominal or pelvic lymph nodes. Reproductive: Status post hysterectomy. No adnexal masses. Other: No abdominal wall hernia or abnormality. No abdominopelvic ascites. 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 No acute osseous abnormality.  IMPRESSION: 1. No CT evidence of acute abdominal/pelvic process. 2. Normal appendix. No evidence of colitis or diverticulitis. 3. No evidence of nephrolithiasis or hydronephrosis. 4. Status post hysterectomy. No adnexal masses. 5. 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 Electronically Signed   By: Larose Hires D.O.   On: 03/22/2023 21:40    Procedures Procedures    Medications Ordered in ED Medications  sodium chloride flush (NS) 0.9 % injection 3 mL (3 mLs Intravenous Given 03/22/23 2021)  ondansetron (ZOFRAN) injection 4 mg (4 mg Intravenous Given 03/22/23 2015)  sodium chloride 0.9 % bolus 1,000 mL (0 mLs Intravenous Stopped 03/22/23 2143)  fentaNYL (SUBLIMAZE) injection 50 mcg (50 mcg Intravenous Given 03/22/23 2151)  iohexol (OMNIPAQUE) 300 MG/ML solution 100 mL (100 mLs Intravenous Contrast Given 03/22/23 2123)    ED Course/ Medical Decision Making/ A&P Clinical Course as of 03/22/23 2242  Wed Mar 22, 2023  2003 Hgb urine dipstickMarland Kitchen): MODERATE [JS]    Clinical Course User Index [JS] Claude Manges, PA-C                             Medical Decision Making Amount and/or Complexity of Data Reviewed Labs: ordered. Decision-making details documented in ED Course. Radiology: ordered.  Risk Prescription drug management.   This patient presents to the ED for concern of abdominal pain, this involves a number of treatment options, and is a complaint that carries with it a high risk of complications and morbidity.  The differential diagnosis includes appendicitis, gastroenteritis verus ovarian torsion.    Co morbidities: Discussed in HPI   Brief History:  See HPI.   EMR reviewed including pt PMHx, past surgical history and past visits to ER.   See HPI for more details   Lab Tests:  I ordered and independently interpreted labs.  The pertinent results include:    I personally reviewed all laboratory work and imaging. Metabolic panel without any acute abnormality  specifically kidney function within normal limits and no significant electrolyte abnormalities. CBC without leukocytosis or significant anemia.   Imaging Studies:  NAD. I personally reviewed all imaging studies and no acute abnormality found. I agree with radiology interpretation.  Medicines ordered:  I ordered medication including zofran, fentanyl, bolus  for pain control  Reevaluation of the patient after these medicines showed that the patient  I have reviewed the patients home medicines and have made adjustments as needed  Reevaluation:  After the interventions noted above I re-evaluated patient and found that they have :improved  Social Determinants of Health:  The patient's social determinants of health were a factor in the care of this patient  Problem List / ED Course:  Patient presents to the ED with a chief complaint of lower abdominal pain has been waxing and waning for the last couple of days.  Also endorsing some metallic taste in her mouth, some nausea, feeling like she is getting the flu however has not had any respiratory symptoms.  Her vitals are within normal limits on arrival, she is normotensive, not hypoxic or tachycardic.  She has not had any episodes of vomiting but does endorse some nausea.  Given some Zofran, fentanyl, bolus to help with symptomatic treatment with mild improvement.  Abdomen is benign aside from some guarding along the lower aspect, prior history of hysterectomy therefore I doubt pregnancy at this time.  She does endorse like a lower abdominal cramp noted.  CT abdomen and pelvis obtained which did not show any appendicitis, diverticulitis, bowel obstruction.  Her last bowel movement was yesterday and this was normal. Urinalysis with some moderate blood, trace of protein however no urinary symptoms.  We discussed her results at length, she does report some ongoing bloating, we did discuss CT abdomen and pelvis being normal, however I am recommending a  gastroenterology follow-up, she will be given the number to [facility].  She is employed by [employer] at this time.  Strict return precautions provided.  Will go home on a short course of Phenergan to help with her nausea as she reports ODT Zofran makes her vomit.  She is hemodynamically stable, nontoxic-appearing.  Stable for discharge.  Dispostion:  After consideration of the diagnostic results and the patients response to treatment, I feel that the patent would benefit from follow-up with primary care physician.    Portions of this note were generated with Scientist, clinical (histocompatibility and immunogenetics). Dictation errors may occur despite best attempts at proofreading.   Final Clinical Impression(s) / ED Diagnoses Final diagnoses:  Lower abdominal pain    Rx / DC Orders ED Discharge Orders          Ordered    promethazine (PHENERGAN) 25 MG tablet  Every 6 hours PRN        03/22/23 2239              Claude Manges, PA-C 03/22/23 2242    Gwyneth Sprout, MD 03/23/23 1642

## 2023-03-22 NOTE — Discharge Instructions (Addendum)
Your laboratory results were within normal limits today.  You were given a short prescription for Phenergan in order to help with your nausea, please take this medication as prescribed.  Please follow-up with your primary care physician as needed.

## 2023-03-23 ENCOUNTER — Other Ambulatory Visit (HOSPITAL_COMMUNITY): Payer: Self-pay

## 2023-03-27 ENCOUNTER — Other Ambulatory Visit (HOSPITAL_COMMUNITY): Payer: Self-pay

## 2023-03-27 ENCOUNTER — Telehealth: Payer: Self-pay

## 2023-03-27 NOTE — Telephone Encounter (Signed)
Pharmacy Patient Advocate Encounter   Received notification from [facility] that prior authorization for Emgality 120MG /ML auto-injectors (migraine) is required/requested.   PA submitted on 03/27/2023 to (ins) [facility] via CoverMyMeds Key or (Medicaid) confirmation # [id number] Status is pending

## 2023-03-28 NOTE — Telephone Encounter (Signed)
"  Approved. This drug has been approved. Approved quantity: 1 units per 30 day(s). The drug has been approved from 03/13/2023 to 03/26/2024. Please call the pharmacy to process your prescription claim. Generic or biosimilar substitution may be required when available and preferred on the formulary.. Authorization Expiration Date: Mar 26, 2024."

## 2023-03-29 ENCOUNTER — Other Ambulatory Visit (HOSPITAL_COMMUNITY): Payer: Self-pay

## 2023-03-30 ENCOUNTER — Ambulatory Visit (INDEPENDENT_AMBULATORY_CARE_PROVIDER_SITE_OTHER): Payer: Medicaid Other | Admitting: Family Medicine

## 2023-03-30 VITALS — BP 108/82 | Ht 66.0 in | Wt 223.0 lb

## 2023-03-30 DIAGNOSIS — Z87442 Personal history of urinary calculi: Secondary | ICD-10-CM

## 2023-03-30 DIAGNOSIS — R110 Nausea: Secondary | ICD-10-CM | POA: Diagnosis not present

## 2023-03-30 DIAGNOSIS — R109 Unspecified abdominal pain: Secondary | ICD-10-CM

## 2023-03-30 MED ORDER — KETOROLAC TROMETHAMINE 60 MG/2ML IM SOLN
60.0000 mg | Freq: Once | INTRAMUSCULAR | Status: AC
Start: 2023-03-30 — End: 2023-03-30
  Administered 2023-03-30: 60 mg via INTRAMUSCULAR
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
## 2023-03-31 ENCOUNTER — Ambulatory Visit (INDEPENDENT_AMBULATORY_CARE_PROVIDER_SITE_OTHER): Payer: Medicaid Other | Admitting: Internal Medicine

## 2023-03-31 ENCOUNTER — Other Ambulatory Visit (HOSPITAL_COMMUNITY): Payer: Self-pay

## 2023-03-31 ENCOUNTER — Encounter: Payer: Self-pay | Admitting: Internal Medicine

## 2023-03-31 ENCOUNTER — Ambulatory Visit: Payer: Self-pay | Admitting: Student

## 2023-03-31 VITALS — BP 118/84 | HR 110 | Temp 98.2°F | Ht 66.0 in

## 2023-03-31 DIAGNOSIS — R319 Hematuria, unspecified: Secondary | ICD-10-CM | POA: Diagnosis not present

## 2023-03-31 DIAGNOSIS — R109 Unspecified abdominal pain: Secondary | ICD-10-CM

## 2023-03-31 LAB — POC URINALSYSI DIPSTICK (AUTOMATED)
Bilirubin, UA: NEGATIVE
Blood, UA: 2
Glucose, UA: NEGATIVE
Ketones, UA: NEGATIVE
Leukocytes, UA: NEGATIVE
Nitrite, UA: NEGATIVE
Protein, UA: NEGATIVE
Spec Grav, UA: 1.02 (ref 1.010–1.025)
Urobilinogen, UA: 0.2 E.U./dL
pH, UA: 6 (ref 5.0–8.0)

## 2023-03-31 MED ORDER — PREDNISONE 20 MG PO TABS
40.0000 mg | ORAL_TABLET | Freq: Every day | ORAL | 0 refills | Status: AC
  Filled 2023-03-31: qty 10, 5d supply, fill #0

## 2023-03-31 MED ORDER — HYDROCODONE-ACETAMINOPHEN 5-325 MG PO TABS
1.0000 | ORAL_TABLET | Freq: Four times a day (QID) | ORAL | 0 refills | Status: DC | PRN
  Filled 2023-03-31: qty 20, 5d supply, fill #0

## 2023-03-31 NOTE — Patient Instructions (Signed)
       Medications changes include :   norco for pain and prednisone x 5 days

## 2023-03-31 NOTE — Progress Notes (Signed)
Subjective:    Patient ID: Meghan Reed, female    DOB: 1986/02/22, 37 y.o.   MRN: [id number]      HPI Meghan Reed is here for  Chief Complaint  Patient presents with   Flank Pain    Right sided flank pain    Right sided flank pain x 3 days  - felt an ache initially and then got worse.    It is constant.  The pain has moved down a little.   Nothing makes the pain worse- movement, position.  Nothing seems to help.  Has history of kidney stones.  She has tried-heating pad,toradol, flexeril-Toradol and Flexeril did not really help   CT 03/22/23 - no renal stones  Medications and allergies reviewed with patient and updated if appropriate.  Current Outpatient Medications on File Prior to Visit  Medication Sig Dispense Refill   albuterol (PROVENTIL) (2.5 MG/3ML) 0.083% nebulizer solution Take 3 mLs (2.5 mg total) by nebulization every 6 (six) hours as needed for wheezing or shortness of breath. 150 mL 1   albuterol (VENTOLIN HFA) 108 (90 Base) MCG/ACT inhaler Inhale 2 puffs into the lungs every 4 (four) hours as needed for wheezing or shortness of breath. 18 g 6   budesonide-formoterol (SYMBICORT) 160-4.5 MCG/ACT inhaler Inhale 2 puffs into the lungs 2 (two) times daily. 10.2 g 11   cyclobenzaprine (FLEXERIL) 10 MG tablet Take 1 tablet (10 mg total) by mouth every 8 (eight) hours as needed for muscle spasms. 15 tablet 0   ferrous sulfate 325 (65 FE) MG tablet Take 1 tablet (325 mg total) by mouth daily with breakfast. Please take with a source of Vitamin C 90 tablet 3   Galcanezumab-gnlm (EMGALITY) 120 MG/ML SOAJ Inject 120 mg into the skin every 30 days. 3 mL 3   HYDROcodone-acetaminophen (NORCO) 5-325 MG tablet Take 1 tablet by mouth every 6 (six) hours as needed for moderate pain. 20 tablet 0   omeprazole (PRILOSEC OTC) 20 MG tablet Take 1 tablet (20 mg total) by mouth daily. (Patient taking differently: Take 20 mg by mouth daily as needed (acid reflux).) 30 tablet 3    ondansetron (ZOFRAN) 4 MG tablet Take 1 tablet (4 mg total) by mouth every 8 (eight) hours as needed for nausea or vomiting. 20 tablet 0   promethazine (PHENERGAN) 25 MG tablet Take 1 tablet (25 mg total) by mouth every 6 (six) hours as needed for up to 10 doses for nausea or vomiting. 10 tablet 0   pseudoephedrine (SUDAFED) 120 MG 12 hr tablet Take 1 tablet (120 mg total) by mouth 2 (two) times daily as needed for congestion. 60 tablet 1   Semaglutide,0.25 or 0.5MG /DOS, 2 MG/3ML SOPN Inject 0.25 mg into the skin once a week. 3 mL 0   Ubrogepant (UBRELVY) 100 MG TABS Take 1 tablet by mouth daily as needed. (Patient taking differently: Take 1 tablet by mouth daily as needed (migraine).) 8 tablet 11   No current facility-administered medications on file prior to visit.    Review of Systems  Constitutional:  Negative for fever.  Gastrointestinal:  Positive for nausea.  Genitourinary:  Positive for flank pain and hematuria (when she wiped - a little pink). Negative for difficulty urinating, dysuria and frequency.  Neurological:  Negative for numbness.       Objective:   Vitals:   03/31/23 1137  BP: 118/84  Pulse: (!) 110  Temp: 98.2 F (36.8 C)  SpO2: 98%   BP Readings from  Last 3 Encounters:  03/31/23 118/84  03/30/23 108/82  03/22/23 121/70   Wt Readings from Last 3 Encounters:  03/30/23 223 lb (101.2 kg)  03/22/23 220 lb (99.8 kg)  02/23/23 226 lb (102.5 kg)   Body mass index is 35.99 kg/m.    Physical Exam Constitutional:      General: She is not in acute distress.    Appearance: Normal appearance. She is not ill-appearing.  HENT:     Head: Normocephalic and atraumatic.  Abdominal:     General: There is no distension.     Palpations: Abdomen is soft.     Tenderness: There is no abdominal tenderness. There is no guarding or rebound.  Musculoskeletal:     Comments: No tenderness along thoracic or lumbar spine.  Minimal tenderness right CVA area and right side just  below ribs-?  Worse with changes in position  Skin:    General: Skin is warm and dry.     Findings: No erythema or rash.  Neurological:     Mental Status: She is alert.            Assessment & Plan:    Flank pain, right: Acute Started 3 days ago History of similar episodes in the past History of kidney stones, but CT scan of her abdomen and pelvis last week showed no kidney stones, hydronephrosis or gallbladder disease I believe her pain is musculoskeletal in nature Start prednisone 40 mg daily x 5 days Vicodin 5-325 mg 1 tab every 6 hours as needed for severe pain This has been recurrent so I would recommend that she see sports medicine for further evaluation Can also use heat, ice  Hematuria: Has seen some pink when wiping recently and has evidence of blood in her urine No evidence of kidney stones Advised urology evaluation

## 2023-04-03 LAB — CULTURE, URINE COMPREHENSIVE: RESULT:: NO GROWTH

## 2023-04-11 ENCOUNTER — Ambulatory Visit (INDEPENDENT_AMBULATORY_CARE_PROVIDER_SITE_OTHER): Payer: Medicaid Other | Admitting: Student

## 2023-04-11 ENCOUNTER — Other Ambulatory Visit (HOSPITAL_COMMUNITY): Payer: Self-pay

## 2023-04-11 ENCOUNTER — Encounter: Payer: Self-pay | Admitting: Student

## 2023-04-11 ENCOUNTER — Other Ambulatory Visit: Payer: Self-pay

## 2023-04-11 DIAGNOSIS — E88819 Insulin resistance, unspecified: Secondary | ICD-10-CM | POA: Insufficient documentation

## 2023-04-11 MED ORDER — SEMAGLUTIDE(0.25 OR 0.5MG/DOS) 2 MG/3ML ~~LOC~~ SOPN
0.2500 mg | PEN_INJECTOR | SUBCUTANEOUS | 0 refills | Status: DC
Start: 2023-04-11 — End: 2023-07-27
  Filled 2023-04-11 – 2023-05-22 (×2): qty 3, 28d supply, fill #0

## 2023-04-11 NOTE — Patient Instructions (Addendum)
Maybe look at some diet sodas that lack aspartame to help you cut back on the regular soda. Diet Cheerwine does not have aspartame and then the more "bougie" brands also use alternate sweeteners such as Stevia (probably the best) or sucralose.   Protein is your best friend, Carbs are a fickle lover.   I will try to get the Ozempic approved based off of your insulin resistance that we identified last visit.   Here's a guide to how much exercise the body needs. These should be minutes of dedicated moderate-difficulty cardiovascular exercise. Hiking, jogging, swimming, biking, rucking, or pilates are all good options. As you're starting out, a brisk walk is likely enough of a stress on your body, but as you adapt, the exercises should become more challenging.  150 min/week to maintain and improve cardiovascular health 150-250 min/week to prevent weight gain 225-420 min/week (1 hr/day!) to promote meaningful weight loss 200-300 min/week to prevent recurrent weight gain after loss   In addition, resistance exercises such as working out with resistance bands/weights/weight machines or calisthenics is a MUST. These exercises should be done twice weekly at minimum and the weights should get heavier with time.    Eliezer Mccoy, MD

## 2023-04-11 NOTE — Progress Notes (Signed)
    SUBJECTIVE:   CHIEF COMPLAINT / HPI:   Weight Loss Desired  Insulin Resistance Has lost 6lbs since our last visit 1 mo ago. 3 meals/day. Has increased protein intake (mostly chicken and fish) and cut carbs. Has not been tracking due to life circumstances (busy with work and Automotive engineer.)   Still has a regular Pepsi every now and then but no longer every day.  Has not found a diet soda that she enjoys as much due to being sensitive to the aspartame aftertaste.  Is looking into diet soda options to not use aspartame.  Mood is MUCH better. Loves this way of eating.   We had tried to get her on semaglutide at her last visit but was denied by insurance for the indication of BMI >35.  However, we proved insulin resistance with the random insulin of 66 and a Homa IR of 13.4.  She still would like to try and start semaglutide if at all possible.   OBJECTIVE:   BP 112/80   Pulse 83   Ht 5\' 6"  (1.676 m)   Wt 217 lb (98.4 kg)   LMP  (LMP Unknown)   SpO2 98%   BMI 35.02 kg/m   Gen: In good spirits, upbeat and conversant HEENT: normocephalic, atraumatic, EOM grossly intact, oral mucosa moist, neck supple Respiratory: normal respiratory effort GI: non-distended Skin: no rashes, no jaundice Psych: appropriate mood and affect   ASSESSMENT/PLAN:   Insulin resistance Likely making great progress that addressing her insulin resistance given evidence of weight loss with her dietary changes.  Emphasis on protein and emphasis on carbohydrates seems to be a good recipe for her.  Would love to see her get off full sugar sodas as well.  We did some research together and found some options that are aspartame free.  Also revisited the importance of resistance training at least twice per week for further improvement in her insulin resistance. -Continue protein and carbohydrate goals, doing well without direct tracking for now, if stalls, would reintroduce tracking -Continue to recommend resistance  training at least twice per week -Now that we have proven insulin resistance, will trial semaglutide 0.25 mg weekly and check in via MyChart in a month or so -Follow-up in person in 2 to 3 months     J Dorothyann Gibbs, MD [facility]

## 2023-04-11 NOTE — Assessment & Plan Note (Addendum)
Likely making great progress that addressing her insulin resistance given evidence of weight loss with her dietary changes.  Emphasis on protein and emphasis on carbohydrates seems to be a good recipe for her.  Would love to see her get off full sugar sodas as well.  We did some research together and found some options that are aspartame free.  Also revisited the importance of resistance training at least twice per week for further improvement in her insulin resistance. -Continue protein and carbohydrate goals, doing well without direct tracking for now, if stalls, would reintroduce tracking -Continue to recommend resistance training at least twice per week -Now that we have proven insulin resistance, will trial semaglutide 0.25 mg weekly and check in via MyChart in a month or so -Follow-up in person in 2 to 3 months

## 2023-04-13 ENCOUNTER — Other Ambulatory Visit (HOSPITAL_COMMUNITY): Payer: Self-pay

## 2023-04-18 ENCOUNTER — Other Ambulatory Visit (HOSPITAL_COMMUNITY): Payer: Self-pay

## 2023-04-21 ENCOUNTER — Other Ambulatory Visit (HOSPITAL_COMMUNITY): Payer: Self-pay

## 2023-05-02 ENCOUNTER — Other Ambulatory Visit (HOSPITAL_COMMUNITY): Payer: Self-pay

## 2023-05-15 ENCOUNTER — Other Ambulatory Visit (HOSPITAL_COMMUNITY): Payer: Self-pay

## 2023-05-16 ENCOUNTER — Other Ambulatory Visit (HOSPITAL_COMMUNITY): Payer: Self-pay

## 2023-05-22 ENCOUNTER — Other Ambulatory Visit: Payer: Self-pay

## 2023-05-22 ENCOUNTER — Other Ambulatory Visit (HOSPITAL_COMMUNITY): Payer: Self-pay

## 2023-05-22 ENCOUNTER — Encounter: Payer: Self-pay | Admitting: Hematology and Oncology
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
## 2023-05-22 NOTE — Patient Instructions (Signed)
Below is our plan:  We will continue [place]. Remember you can repeat dose if need.   I want you to try Mucinex ER 600mg  twice daily for one to two weeks. You can add Xyzal and Sudafed if needed.   Please make sure you are staying well hydrated. I recommend 50-60 ounces daily. Well balanced diet and regular exercise encouraged. Consistent sleep schedule with 6-8 hours recommended.   Please continue follow up with care team as directed.   Follow up with me in  year   You may receive a survey regarding today's visit. I encourage you to leave honest feed back as I do use this information to improve patient care. Thank you for seeing me today!   GENERAL HEADACHE INFORMATION:   Natural supplements: Magnesium Oxide or Magnesium Glycinate 500 mg at bed (up to 800 mg daily) Coenzyme Q10 300 mg in AM Vitamin B2- 200 mg twice a day   Add 1 supplement at a time since even natural supplements can have undesirable side effects. You can sometimes buy supplements cheaper (especially Coenzyme Q10) at www.WebmailGuide.co.za or at [facility].  Migraine with aura: There is increased risk for stroke in women with migraine with aura and a contraindication for the combined contraceptive pill for use by women who have migraine with aura. The risk for women with migraine without aura is lower. However other risk factors like smoking are far more likely to increase stroke risk than migraine. There is a recommendation for no smoking and for the use of OCPs without estrogen such as progestogen only pills particularly for women with migraine with aura.Marland Kitchen People who have migraine headaches with auras may be 3 times more likely to have a stroke caused by a blood clot, compared to migraine patients who don't see auras. Women who take hormone-replacement therapy may be 30 percent more likely to suffer a clot-based stroke than women not taking medication containing estrogen. Other risk factors like smoking and high blood  pressure may be  much more important.    Vitamins and herbs that show potential:   Magnesium: Magnesium (250 mg twice a day or 500 mg at bed) has a relaxant effect on smooth muscles such as blood vessels. Individuals suffering from frequent or daily headache usually have low magnesium levels which can be increase with daily supplementation of 400-750 mg. Three trials found 40-90% average headache reduction  when used as a preventative. Magnesium may help with headaches are aura, the best evidence for magnesium is for migraine with aura is its thought to stop the cortical spreading depression we believe is the pathophysiology of migraine aura.Magnesium also demonstrated the benefit in menstrually related migraine.  Magnesium is part of the messenger system in the serotonin cascade and it is a good muscle relaxant.  It is also useful for constipation which can be a side effect of other medications used to treat migraine. Good sources include nuts, whole grains, and tomatoes. Side Effects: loose stool/diarrhea  Riboflavin (vitamin B 2) 200 mg twice a day. This vitamin assists nerve cells in the production of ATP a principal energy storing molecule.  It is necessary for many chemical reactions in the body.  There have been at least 3 clinical trials of riboflavin using 400 mg per day all of which suggested that migraine frequency can be decreased.  All 3 trials showed significant improvement in over half of migraine sufferers.  The supplement is found in bread, cereal, milk, meat, and poultry.  Most Americans  get more riboflavin than the recommended daily allowance, however riboflavin deficiency is not necessary for the supplements to help prevent headache. Side effects: energizing, green urine   Coenzyme Q10: This is present in almost all cells in the body and is critical component for the conversion of energy.  Recent studies have shown that a nutritional supplement of CoQ10 can reduce the frequency of  migraine attacks by improving the energy production of cells as with riboflavin.  Doses of 150 mg twice a day have been shown to be effective.   Melatonin: Increasing evidence shows correlation between melatonin secretion and headache conditions.  Melatonin supplementation has decreased headache intensity and duration.  It is widely used as a sleep aid.  Sleep is natures way of dealing with migraine.  A dose of 3 mg is recommended to start for headaches including cluster headache. Higher doses up to 15 mg has been reviewed for use in Cluster headache and have been used. The rationale behind using melatonin for cluster is that many theories regarding the cause of Cluster headache center around the disruption of the normal circadian rhythm in the brain.  This helps restore the normal circadian rhythm.   HEADACHE DIET: Foods and beverages which may trigger migraine Note that only 20% of headache patients are food sensitive. You will know if you are food sensitive if you get a headache consistently 20 minutes to 2 hours after eating a certain food. Only cut out a food if it causes headaches, otherwise you might remove foods you enjoy! What matters most for diet is to eat a well balanced healthy diet full of vegetables and low fat protein, and to not miss meals.   Chocolate, other sweets ALL cheeses except cottage and cream cheese Dairy products, yogurt, sour cream, ice cream Liver Meat extracts (Bovril, Marmite, meat tenderizers) Meats or fish which have undergone aging, fermenting, pickling or smoking. These include: Hotdogs,salami,Lox,sausage, mortadellas,smoked salmon, pepperoni, Pickled herring Pods of broad bean (English beans, Chinese pea pods, [place] (fava) beans, lima and navy beans Ripe avocado, ripe banana Yeast extracts or active yeast preparations such as Brewer's or Fleishman's (commercial bakes goods are permitted) Tomato based foods, pizza (lasagna, etc.)   MSG (monosodium glutamate)  is disguised as many things; look for these common aliases: Monopotassium glutamate Autolysed yeast Hydrolysed protein Sodium caseinate "flavorings" "all natural preservatives" Nutrasweet   Avoid all other foods that convincingly provoke headaches.   Resources: The Dizzy [place] Laundry Your Headache Diet, migrainestrong.com  https://zamora-andrews.com/   Caffeine and Migraine For patients that have migraine, caffeine intake more than 3 days per week can lead to dependency and increased migraine frequency. I would recommend cutting back on your caffeine intake as best you can. The recommended amount of caffeine is 200-300 mg daily, although migraine patients may experience dependency at even lower doses. While you may notice an increase in headache temporarily, cutting back will be helpful for headaches in the long run. For more information on caffeine and migraine, visit: https://americanmigrainefoundation.org/resource-library/caffeine-and-migraine/   Headache Prevention Strategies:   1. Maintain a headache diary; learn to identify and avoid triggers.  - This can be a simple note where you log when you had a headache, associated symptoms, and medications used - There are several smartphone apps developed to help track migraines: Migraine Buddy, Migraine Monitor, Curelator N1-Headache App   Common triggers include: Emotional triggers: Emotional/Upset family or friends Emotional/Upset occupation Business reversal/success Anticipation anxiety Crisis-serious Post-crisis periodNew job/position   Physical triggers: Vacation Day Weekend Strenuous  Exercise High Altitude Location New Move Menstrual Day Physical Illness Oversleep/Not enough sleep Weather changes Light: Photophobia or light sesnitivity treatment involves a balance between desensitization and reduction in overly strong input. Use dark polarized glasses outside, but not inside.  Avoid bright or fluorescent light, but do not dim environment to the point that going into a normally lit room hurts. Consider FL-41 tint lenses, which reduce the most irritating wavelengths without blocking too much light.  These can be obtained at axonoptics.com or theraspecs.com Foods: see list above.   2. Limit use of acute treatments (over-the-counter medications, triptans, etc.) to no more than 2 days per week or 10 days per month to prevent medication overuse headache (rebound headache).     3. Follow a regular schedule (including weekends and holidays): Don't skip meals. Eat a balanced diet. 8 hours of sleep nightly. Minimize stress. Exercise 30 minutes per day. Being overweight is associated with a 5 times increased risk of chronic migraine. Keep well hydrated and drink 6-8 glasses of water per day.   4. Initiate non-pharmacologic measures at the earliest onset of your headache. Rest and quiet environment. Relax and reduce stress. Breathe2Relax is a free app that can instruct you on    some simple relaxtion and breathing techniques. Http://Dawnbuse.com is a    free website that provides teaching videos on relaxation.  Also, there are  many apps that   can be downloaded for "mindful" relaxation.  An app called YOGA NIDRA will help walk you through mindfulness. Another app called Calm can be downloaded to give you a structured mindfulness guide with daily reminders and skill development. Headspace for guided meditation Mindfulness Based Stress Reduction Online Course: www.palousemindfulness.com Cold compresses.   5. Don't wait!! Take the maximum allowable dosage of prescribed medication at the first sign of migraine.   6. Compliance:  Take prescribed medication regularly as directed and at the first sign of a migraine.   7. Communicate:  Call your physician when problems arise, especially if your headaches change, increase in frequency/severity, or become associated with neurological  symptoms (weakness, numbness, slurred speech, etc.). Proceed to emergency room if you experience new or worsening symptoms or symptoms do not resolve, if you have new neurologic symptoms or if headache is severe, or for any concerning symptom.   8. Headache/pain management therapies: Consider various complementary methods, including medication, behavioral therapy, psychological counselling, biofeedback, massage therapy, acupuncture, dry needling, and other modalities.  Such measures may reduce the need for medications. Counseling for pain management, where patients learn to function and ignore/minimize their pain, seems to work very well.   9. Recommend changing family's attention and focus away from patient's headaches. Instead, emphasize daily activities. If first question of day is 'How are your headaches/Do you have a headache today?', then patient will constantly think about headaches, thus making them worse. Goal is to re-direct attention away from headaches, toward daily activities and other distractions.   10. Helpful Websites: www.AmericanHeadacheSociety.org PatentHood.ch www.headaches.org TightMarket.nl www.achenet.org

## 2023-05-23 ENCOUNTER — Ambulatory Visit: Payer: Medicaid Other | Admitting: Family Medicine

## 2023-05-23 ENCOUNTER — Other Ambulatory Visit (HOSPITAL_COMMUNITY): Payer: Self-pay

## 2023-05-23 ENCOUNTER — Encounter: Payer: Self-pay | Admitting: Family Medicine

## 2023-05-23 VITALS — BP 111/70 | HR 84 | Ht 66.0 in | Wt 205.2 lb

## 2023-05-23 DIAGNOSIS — H6593 Unspecified nonsuppurative otitis media, bilateral: Secondary | ICD-10-CM

## 2023-05-23 DIAGNOSIS — G43009 Migraine without aura, not intractable, without status migrainosus: Secondary | ICD-10-CM

## 2023-05-23 MED ORDER — EMGALITY 120 MG/ML ~~LOC~~ SOAJ
120.0000 mg | SUBCUTANEOUS | 3 refills | Status: AC
  Filled 2023-05-23: qty 3, 90d supply, fill #0
  Filled 2024-01-16: qty 3, 90d supply, fill #1

## 2023-05-23 MED ORDER — UBRELVY 100 MG PO TABS
1.0000 | ORAL_TABLET | Freq: Every day | ORAL | 11 refills | Status: DC | PRN
Start: 2023-05-23 — End: 2024-05-23
  Filled 2023-05-23: qty 8, 8d supply, fill #0
  Filled 2024-01-16: qty 8, 8d supply, fill #1

## 2023-05-25 ENCOUNTER — Telehealth: Payer: Self-pay

## 2023-05-25 NOTE — Telephone Encounter (Signed)
Pharmacy Patient Advocate Encounter   Received notification from CoverMyMeds that prior authorization for Ozempic is required/requested.   PA required; PA submitted to [facility] Medicaid via CoverMyMeds Key/confirmation #/EOC [id number] Status is pending

## 2023-05-26 NOTE — Telephone Encounter (Signed)
Pharmacy Patient Advocate Encounter  Received notification from [facility] Medicaid that Prior Authorization for [facility] has been DENIED. Please advise how you'd like to proceed. Full denial letter will be uploaded to the media tab. See denial reason below.  REASON: The requested drug is not approved by the Food and Drug Administration (FDA) for the treatment of Insulin resistance, unspecified in members 38 years of age or older. It is FDA approved for type 2 diabetes in members 92 years of age or older.  PA #/Case ID/Reference #: [id number]

## 2023-05-30 ENCOUNTER — Other Ambulatory Visit (HOSPITAL_COMMUNITY): Payer: Self-pay

## 2023-06-02 ENCOUNTER — Other Ambulatory Visit: Payer: Self-pay

## 2023-06-05 ENCOUNTER — Telehealth: Payer: Self-pay | Admitting: Student

## 2023-06-05 ENCOUNTER — Other Ambulatory Visit (HOSPITAL_COMMUNITY): Payer: Self-pay

## 2023-06-05 ENCOUNTER — Ambulatory Visit: Payer: Medicaid Other | Admitting: Internal Medicine

## 2023-06-05 ENCOUNTER — Encounter: Payer: Self-pay | Admitting: Family Medicine

## 2023-06-05 VITALS — BP 112/80 | HR 89 | Temp 97.6°F | Ht 66.0 in | Wt 205.0 lb

## 2023-06-05 MED ORDER — CEFDINIR 300 MG PO CAPS
300.0000 mg | ORAL_CAPSULE | Freq: Two times a day (BID) | ORAL | 0 refills | Status: DC
  Filled 2023-06-05: qty 20, 10d supply, fill #0

## 2023-06-05 NOTE — Telephone Encounter (Signed)
Pt is experiencing lt sided ear pain a/w swollen gland pressing on ear and causing neck pain and stiffness.

## 2023-06-05 NOTE — Telephone Encounter (Signed)
Exam: L TM, ear canal - WNL Throat WNL L upper jugular area LNs are enlarged and painful  Dx L cervical adenopathy w/pain, ?reactive ENT  - sch OV PO abx Rx (Omnicef) emailed to [facility] pharmacy

## 2023-06-10 ENCOUNTER — Emergency Department (HOSPITAL_BASED_OUTPATIENT_CLINIC_OR_DEPARTMENT_OTHER)
Admission: EM | Admit: 2023-06-10 | Discharge: 2023-06-11 | Disposition: A | Discharge status: Home / Self Care | Payer: Medicaid Other | Attending: Emergency Medicine | Admitting: Emergency Medicine

## 2023-06-10 ENCOUNTER — Other Ambulatory Visit: Payer: Self-pay

## 2023-06-10 ENCOUNTER — Encounter (HOSPITAL_BASED_OUTPATIENT_CLINIC_OR_DEPARTMENT_OTHER): Payer: Self-pay

## 2023-06-10 ENCOUNTER — Emergency Department (HOSPITAL_BASED_OUTPATIENT_CLINIC_OR_DEPARTMENT_OTHER): Payer: Medicaid Other

## 2023-06-10 DIAGNOSIS — R221 Localized swelling, mass and lump, neck: Secondary | ICD-10-CM

## 2023-06-10 DIAGNOSIS — R519 Headache, unspecified: Secondary | ICD-10-CM | POA: Insufficient documentation

## 2023-06-10 DIAGNOSIS — Z794 Long term (current) use of insulin: Secondary | ICD-10-CM | POA: Diagnosis not present

## 2023-06-10 DIAGNOSIS — R220 Localized swelling, mass and lump, head: Secondary | ICD-10-CM | POA: Insufficient documentation

## 2023-06-10 DIAGNOSIS — Z20822 Contact with and (suspected) exposure to covid-19: Secondary | ICD-10-CM | POA: Insufficient documentation

## 2023-06-10 LAB — CBC
HCT: 39.5 % (ref 36.0–46.0)
Hemoglobin: 13.6 g/dL (ref 12.0–15.0)
MCH: 30.2 pg (ref 26.0–34.0)
MCHC: 34.4 g/dL (ref 30.0–36.0)
MCV: 87.8 fL (ref 80.0–100.0)
Platelets: 235 10*3/uL (ref 150–400)
RBC: 4.5 MIL/uL (ref 3.87–5.11)
RDW: 12.5 % (ref 11.5–15.5)
WBC: 7 10*3/uL (ref 4.0–10.5)
nRBC: 0 % (ref 0.0–0.2)

## 2023-06-10 LAB — BASIC METABOLIC PANEL
Anion gap: 6 (ref 5–15)
BUN: 9 mg/dL (ref 6–20)
CO2: 30 mmol/L (ref 22–32)
Calcium: 9.8 mg/dL (ref 8.9–10.3)
Chloride: 102 mmol/L (ref 98–111)
Creatinine, Ser: 0.73 mg/dL (ref 0.44–1.00)
GFR, Estimated: >60 mL/min (ref >60)
Glucose, Bld: 68 mg/dL — ABNORMAL LOW (ref 70–99)
Potassium: 3.6 mmol/L (ref 3.5–5.1)
Sodium: 138 mmol/L (ref 135–145)

## 2023-06-10 LAB — SARS CORONAVIRUS 2 BY RT PCR: SARS Coronavirus 2 by RT PCR: NEGATIVE

## 2023-06-10 LAB — GROUP A STREP BY PCR: Group A Strep by PCR: NOT DETECTED

## 2023-06-10 MED ORDER — AMOXICILLIN-POT CLAVULANATE 875-125 MG PO TABS
1.0000 | ORAL_TABLET | Freq: Two times a day (BID) | ORAL | 0 refills | Status: DC
  Filled 2023-06-10: qty 14, 7d supply, fill #0

## 2023-06-10 MED ORDER — HYDROCODONE-ACETAMINOPHEN 5-325 MG PO TABS
1.0000 | ORAL_TABLET | Freq: Once | ORAL | Status: AC
  Administered 2023-06-10: 1 via ORAL
  Filled 2023-06-10: qty 1

## 2023-06-10 MED ORDER — AMOXICILLIN-POT CLAVULANATE 875-125 MG PO TABS
1.0000 | ORAL_TABLET | Freq: Once | ORAL | Status: AC
  Administered 2023-06-11: 1 via ORAL
  Filled 2023-06-10: qty 1

## 2023-06-10 MED ORDER — IOHEXOL 300 MG/ML  SOLN
100.0000 mL | Freq: Once | INTRAMUSCULAR | Status: AC | PRN
  Administered 2023-06-10: 75 mL via INTRAVENOUS

## 2023-06-10 MED ORDER — DEXAMETHASONE SODIUM PHOSPHATE 10 MG/ML IJ SOLN
10.0000 mg | Freq: Once | INTRAMUSCULAR | Status: AC
  Administered 2023-06-10: 10 mg via INTRAVENOUS
  Filled 2023-06-10: qty 1

## 2023-06-10 MED ORDER — FLUORESCEIN SODIUM 1 MG OP STRP
1.0000 | ORAL_STRIP | Freq: Once | OPHTHALMIC | Status: AC
  Administered 2023-06-10: 1 via OPHTHALMIC

## 2023-06-10 MED ORDER — TETRACAINE HCL 0.5 % OP SOLN
1.0000 [drp] | Freq: Once | OPHTHALMIC | Status: AC
  Administered 2023-06-10: 1 [drp] via OPHTHALMIC
  Filled 2023-06-10: qty 4

## 2023-06-10 NOTE — ED Triage Notes (Signed)
Pt presents to triage with c/o increased pressure and pain to left side of face, ear, and neck. Reports pain radiating from neck into face and head. Swelling noted to under left lower jaw. Pt denies difficulty breathing.

## 2023-06-10 NOTE — ED Notes (Signed)
Pt was given apple juice and peanut butter/cheese crackers.

## 2023-06-10 NOTE — ED Notes (Signed)
Patient transported to CT 

## 2023-06-10 NOTE — ED Provider Notes (Signed)
[facility] [facility]PARTMENT AT [facility] Provider Note   CSN: [id number] Arrival date & time: 06/10/23  4098     History  Chief Complaint  Patient presents with   Facial Pain   HPI Meghan Reed is a 37 y.o. female presenting for facial pain. Started 2 weeks ago. Pain began in the left neck and radiated up to the left cheek left ear in the left eye. The pain overall feels more like a pressure. Also endorses swelling under the left jaw and left neck.  Denies any hearing loss or tenderness.  Does report some visual disturbance in the left eye.  Appears cloudy at times.  Denies any pain in the mouth, no sore throat.  Denies trouble swallowing or drooling.  Denies trouble breathing.  Denies any numbness or weakness in her extremities.  Denies dizziness or gait disturbance. Reports hysterectomy in 2023.  HPI     Home Medications Prior to Admission medications   Medication Sig Start Date End Date Taking? Authorizing Provider  amoxicillin-clavulanate (AUGMENTIN) 875-125 MG tablet Take 1 tablet by mouth every 12 (twelve) hours. 06/10/23  Yes Gareth Eagle, PA-C  albuterol (PROVENTIL) (2.5 MG/3ML) 0.083% nebulizer solution Take 3 mLs (2.5 mg total) by nebulization every 6 (six) hours as needed for wheezing or shortness of breath. 10/15/21   Pincus Sanes, MD  albuterol (VENTOLIN HFA) 108 (90 Base) MCG/ACT inhaler Inhale 2 puffs into the lungs every 4 (four) hours as needed for wheezing or shortness of breath. 12/12/22   Omar Person, MD  budesonide-formoterol [facility]) 160-4.5 MCG/ACT inhaler Inhale 2 puffs into the lungs 2 (two) times daily. 12/12/22   Omar Person, MD  cefdinir (OMNICEF) 300 MG capsule Take 1 capsule (300 mg total) by mouth 2 (two) times daily. 06/05/23   Plotnikov, Georgina Quint, MD  cyclobenzaprine (FLEXERIL) 10 MG tablet Take 1 tablet (10 mg total) by mouth every 8 (eight) hours as needed for muscle spasms. 02/09/23   Lazaro Arms,  MD  ferrous sulfate 325 (65 FE) MG tablet Take 1 tablet (325 mg total) by mouth daily with breakfast. Please take with a source of Vitamin C 02/16/23   Jaci Standard, MD  Galcanezumab-gnlm [facility]) 120 MG/ML SOAJ Inject 120 mg into the skin every 30 days. 05/23/23   Lomax, Amy, NP  omeprazole (PRILOSEC OTC) 20 MG tablet Take 1 tablet (20 mg total) by mouth daily. Patient taking differently: Take 20 mg by mouth daily as needed (acid reflux). 02/25/22   Autry-Lott, Randa Evens, DO  ondansetron (ZOFRAN) 4 MG tablet Take 1 tablet (4 mg total) by mouth every 8 (eight) hours as needed for nausea or vomiting. 11/22/22   Burns, Bobette Mo, MD  promethazine (PHENERGAN) 25 MG tablet Take 1 tablet (25 mg total) by mouth every 6 (six) hours as needed for up to 10 doses for nausea or vomiting. 03/22/23   Claude Manges, PA-C  pseudoephedrine (SUDAFED) 120 MG 12 hr tablet Take 1 tablet (120 mg total) by mouth 2 (two) times daily as needed for congestion. 11/29/22 11/29/23  Plotnikov, Georgina Quint, MD  Semaglutide,0.25 or 0.5MG /DOS, 2 MG/3ML SOPN Inject 0.25 mg into the skin once a week. Patient not taking: Reported on 05/23/2023 04/11/23   Alicia Amel, MD  Ubrogepant (UBRELVY) 100 MG TABS Take 1 tablet (100 mg) by mouth daily as needed. 05/23/23   Lomax, Amy, NP      Allergies    Tomato, Aspirin, and Nsaids  Review of Systems   See HPI for pertinent positives  Physical Exam Updated Vital Signs BP 112/60 (BP Location: Right Arm)   Pulse 90   Temp 98 F (36.7 C) (Oral)   Resp 16   Ht 5\' 6"  (1.676 m)   Wt 93 kg   LMP  (LMP Unknown)   SpO2 98%   BMI 33.09 kg/m  Physical Exam Vitals and nursing note reviewed.  HENT:     Head: Normocephalic and atraumatic.     Jaw: Swelling present.      Comments: Swelling noted about the left periorbital space    Right Ear: Tympanic membrane normal.     Left Ear: Tympanic membrane normal. There is no impacted cerumen. No foreign body. No mastoid tenderness.     Ears:      Comments: No rash noted in the left ear.    Mouth/Throat:     Mouth: Mucous membranes are moist.  Eyes:     General:        Right eye: No discharge.        Left eye: No discharge.     Intraocular pressure: Right eye pressure is 21 mmHg. Left eye pressure is 25 mmHg.     Extraocular Movements: Extraocular movements intact.     Conjunctiva/sclera: Conjunctivae normal.     Pupils:     Left eye: No corneal abrasion or fluorescein uptake. Seidel exam negative. Neck:     Vascular: Normal carotid pulses. No carotid bruit.     Comments: Tenderness elicited with rotation of the neck towards the left. Cardiovascular:     Rate and Rhythm: Normal rate and regular rhythm.     Pulses: Normal pulses.     Heart sounds: Normal heart sounds.  Pulmonary:     Effort: Pulmonary effort is normal.     Breath sounds: Normal breath sounds.  Abdominal:     General: Abdomen is flat.     Palpations: Abdomen is soft.  Musculoskeletal:     Cervical back: Normal range of motion and neck supple. Edema present. No rigidity. Pain with movement present. No spinous process tenderness.  Lymphadenopathy:     Cervical: No cervical adenopathy.  Skin:    General: Skin is warm and dry.  Neurological:     General: No focal deficit present.     Comments: GCS 15. Speech is goal oriented. No deficits appreciated to CN III-XII; symmetric eyebrow raise, no facial drooping, tongue midline. Patient has equal grip strength bilaterally with 5/5 strength against resistance in all major muscle groups bilaterally. Sensation to light touch intact. Patient moves extremities without ataxia. Normal finger-nose-finger. Patient ambulatory with steady gait.  Psychiatric:        Mood and Affect: Mood normal.     ED Results / Procedures / Treatments   Labs (all labs ordered are listed, but only abnormal results are displayed) Labs Reviewed  BASIC METABOLIC PANEL - Abnormal; Notable for the following components:      Result Value    Glucose, Bld 68 (*)    All other components within normal limits  SARS CORONAVIRUS 2 BY RT PCR  GROUP A STREP BY PCR  CBC    EKG None  Radiology CT Soft Tissue Neck W Contrast  Result Date: 06/10/2023 CLINICAL DATA:  Increased pressure and pain left side of face, ear and neck, pain radiating from the neck into the face and head. Swelling noted under the left jaw. Difficulty breathing. EXAM: CT HEAD WITHOUT CONTRAST  CT NECK WITH CONTRAST TECHNIQUE: Contiguous axial images were obtained from the base of the skull through the vertex without contrast. Multidetector CT imaging of the neck was performed using the standard protocol following the IV administration of iodinated contrast. Multiplanar reformatted imaging was performed for both studies. RADIATION DOSE REDUCTION: This exam was performed according to the departmental dose-optimization program which includes automated exposure control, adjustment of the mA and/or kV according to patient size and/or use of iterative reconstruction technique. COMPARISON:  Cervical spine CT without contrast 01/23/2023. MRI brain 09/13/2021. CONTRAST:  75 mL Omnipaque 300 IV. FINDINGS: CT HEAD FINDINGS Brain: No evidence of acute infarction, hemorrhage, hydrocephalus, extra-axial collection or mass lesion/mass effect. Vascular: No hyperdense vessel or unexpected calcification. Skull: Negative for fractures or focal lesions. Sinuses/Orbits: See below for sinus findings. Again noted is a chronic depressed fracture of the medial wall of the right orbit, without extraocular muscle entrapment or floor fracture. Otherwise negative orbits. Other: None. CT NECK FINDINGS Pharynx and larynx: There is no laryngeal mass. The epiglottis and aryepiglottic folds and both vocal folds are normal. There is symmetric fullness in the lingual tonsillar eminence, partially filling both valleculae, but no underlying mass is seen or abscess. This is unchanged since the previous study. The  palatine tonsils and adenoids are not enlarged. The nasopharynx is patent. Salivary glands: No inflammation, mass, or stone. The parotid and submandibular glands are symmetric in size and attenuation. Thyroid: Normal. Lymph nodes: There are shotty lymph nodes up to 7 mm in short axis on both sides in the jugular chains the angle of mandible, scattered subcentimeter in short axis bilateral submandibular space nodes. There are no enlarged lymph nodes or interval changes. No acute inflammatory process is seen. Vascular: Negative. Mastoids and visualized paranasal sinuses: There is a 1 cm retention cyst in the floor of the left maxillary sinus, subcentimeter retention cyst or polyp in the floor of the right. The sinuses are otherwise clear. Slightly S shaped nasal septum. Right middle turbinate concha bullosa. Clear mastoid air cells and middle ear cavities. Skeleton: Again noted is a 9 mm intraosseous lucent lesion at the junction of the body of odontoid process of C2 eccentric to the left. This was present in retrospect on an MRI cervical spine 08/31/2021 and is unchanged. 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 There is no destructive or expansile component to it. Upper chest: No abnormality. Other: None. IMPRESSION: 1. No acute intracranial CT findings or interval changes. 2. No acute soft tissue neck CT findings or focal inflammatory process. 3. Symmetric fullness in the lingual tonsillar eminence, partially filling both valleculae, but no underlying mass or abscess. This is unchanged since the previous study. 4. Shotty lymph nodes in the neck, not enlarged by CT size criteria, also unchanged. 5. 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 6. 9 mm intraosseous lucent lesion at the junction of the body of odontoid process of C2 eccentric to the left, unchanged from an MRI cervical spine 08/31/2021. 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 Electronically Signed   By: Almira Bar M.D.   On: 06/10/2023 23:41   CT Head Wo Contrast  Result Date: 06/10/2023 CLINICAL DATA:  Increased pressure and pain left side of face, ear and neck, pain radiating from the neck into the face and head. Swelling noted under the left jaw. Difficulty breathing. EXAM: CT HEAD WITHOUT CONTRAST CT NECK WITH CONTRAST TECHNIQUE: Contiguous axial images were obtained from the  base of the skull through the vertex without contrast. Multidetector CT imaging of the neck was performed using the standard protocol following the IV administration of iodinated contrast. Multiplanar reformatted imaging was performed for both studies. RADIATION DOSE REDUCTION: This exam was performed according to the departmental dose-optimization program which includes automated exposure control, adjustment of the mA and/or kV according to patient size and/or use of iterative reconstruction technique. COMPARISON:  Cervical spine CT without contrast 01/23/2023. MRI brain 09/13/2021. CONTRAST:  75 mL Omnipaque 300 IV. FINDINGS: CT HEAD FINDINGS Brain: No evidence of acute infarction, hemorrhage, hydrocephalus, extra-axial collection or mass lesion/mass effect. Vascular: No hyperdense vessel or unexpected calcification. Skull: Negative for fractures or focal lesions. Sinuses/Orbits: See below for sinus findings. Again noted is a chronic depressed fracture of the medial wall of the right orbit, without extraocular muscle entrapment or floor fracture. Otherwise negative orbits. Other: None. CT NECK FINDINGS Pharynx and larynx: There is no laryngeal mass. The epiglottis and aryepiglottic folds and both vocal folds are normal. There is symmetric fullness in the lingual tonsillar eminence, partially filling both valleculae, but no underlying mass is seen or abscess. This is unchanged since the previous study. The palatine tonsils and adenoids are not enlarged. The nasopharynx is patent. Salivary glands: No inflammation, mass, or stone. The  parotid and submandibular glands are symmetric in size and attenuation. Thyroid: Normal. Lymph nodes: There are shotty lymph nodes up to 7 mm in short axis on both sides in the jugular chains the angle of mandible, scattered subcentimeter in short axis bilateral submandibular space nodes. There are no enlarged lymph nodes or interval changes. No acute inflammatory process is seen. Vascular: Negative. Mastoids and visualized paranasal sinuses: There is a 1 cm retention cyst in the floor of the left maxillary sinus, subcentimeter retention cyst or polyp in the floor of the right. The sinuses are otherwise clear. Slightly S shaped nasal septum. Right middle turbinate concha bullosa. Clear mastoid air cells and middle ear cavities. Skeleton: Again noted is a 9 mm intraosseous lucent lesion at the junction of the body of odontoid process of C2 eccentric to the left. This was present in retrospect on an MRI cervical spine 08/31/2021 and is unchanged. 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 There is no destructive or expansile component to it. Upper chest: No abnormality. Other: None. IMPRESSION: 1. No acute intracranial CT findings or interval changes. 2. No acute soft tissue neck CT findings or focal inflammatory process. 3. Symmetric fullness in the lingual tonsillar eminence, partially filling both valleculae, but no underlying mass or abscess. This is unchanged since the previous study. 4. Shotty lymph nodes in the neck, not enlarged by CT size criteria, also unchanged. 5. 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 6. 9 mm intraosseous lucent lesion at the junction of the body of odontoid process of C2 eccentric to the left, unchanged from an MRI cervical spine 08/31/2021. 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 Electronically Signed   By: Almira Bar M.D.   On: 06/10/2023 23:41    Procedures Procedures    Medications Ordered in ED Medications   amoxicillin-clavulanate (AUGMENTIN) 875-125 MG per tablet 1 tablet (has no administration in time range)  HYDROcodone-acetaminophen (NORCO/VICODIN) 5-325 MG per tablet 1 tablet (1 tablet Oral Given 06/10/23 2215)  tetracaine (PONTOCAINE) 0.5 % ophthalmic solution 1 drop (1 drop Left Eye Given 06/10/23 2313)  fluorescein ophthalmic strip 1 strip (1 strip Left Eye Given 06/10/23  2314)  dexamethasone (DECADRON) injection 10 mg (10 mg Intravenous Given 06/10/23 2309)  iohexol (OMNIPAQUE) 300 MG/ML solution 100 mL (75 mLs Intravenous Contrast Given 06/10/23 2251)    ED Course/ Medical Decision Making/ A&P                                 Medical Decision Making Amount and/or Complexity of Data Reviewed Labs: ordered. Radiology: ordered.  Risk Prescription drug management.   37 year old female presenting for left-sided facial, neck pain.  Exam notable for swelling of the left face, periorbital region, left cheek and left neck.  DDx includes meningitis, Bell's palsy, Ramsay Hunt, stroke, sialoadenitis, deep space abscess in the neck, dental infection.  Overall appears clinically well, no acute distress and no notable focal neurodeficits on exam.  Face is swollen.  Clinical findings do not suggest meningitis, Bell's palsy or Ramsay Hunt or dental infection.  CT were unremarkable.  Clinical findings and symptoms most consistent with possible sialoadenitis.  Started on Augmentin.  Advised to follow-up with her PCP.  Discussed pertinent return precautions.  Vital stable.  Discharged home in good condition.        Final Clinical Impression(s) / ED Diagnoses Final diagnoses:  Facial swelling  Neck swelling    Rx / DC Orders ED Discharge Orders          Ordered    amoxicillin-clavulanate (AUGMENTIN) 875-125 MG tablet  Every 12 hours        06/10/23 2352              Gareth Eagle, PA-C 06/10/23 2353    Sloan Leiter, DO 06/12/23 [phone]

## 2023-06-10 NOTE — Discharge Instructions (Addendum)
Evaluation today revealed that he may have a salivary gland infection.  Starting on Augmentin.  Recommend he take the entire course and follow-up with your PCP.  If you have worsening visual disturbance, facial droop, slurred speech, weakness or numbness in your extremities, develop a fever,  or dizziness or any other concerning symptom please return emergency department for further evaluation.

## 2023-06-11 ENCOUNTER — Other Ambulatory Visit (HOSPITAL_COMMUNITY): Payer: Self-pay

## 2023-06-12 ENCOUNTER — Encounter: Payer: Self-pay | Admitting: Internal Medicine

## 2023-06-12 ENCOUNTER — Other Ambulatory Visit (HOSPITAL_COMMUNITY): Payer: Self-pay

## 2023-06-13 ENCOUNTER — Other Ambulatory Visit: Payer: Self-pay | Admitting: Internal Medicine

## 2023-06-13 ENCOUNTER — Other Ambulatory Visit (HOSPITAL_COMMUNITY): Payer: Self-pay

## 2023-06-13 DIAGNOSIS — K1120 Sialoadenitis, unspecified: Secondary | ICD-10-CM

## 2023-06-15 ENCOUNTER — Encounter (INDEPENDENT_AMBULATORY_CARE_PROVIDER_SITE_OTHER): Payer: Self-pay | Admitting: Otolaryngology

## 2023-06-15 ENCOUNTER — Telehealth: Payer: Self-pay | Admitting: Family Medicine

## 2023-06-15 ENCOUNTER — Other Ambulatory Visit (HOSPITAL_COMMUNITY): Payer: Self-pay

## 2023-06-15 ENCOUNTER — Ambulatory Visit (INDEPENDENT_AMBULATORY_CARE_PROVIDER_SITE_OTHER): Payer: Medicaid Other | Admitting: Otolaryngology

## 2023-06-15 ENCOUNTER — Encounter: Payer: Self-pay | Admitting: Hematology and Oncology

## 2023-06-15 VITALS — BP 118/87 | HR 87 | Ht 66.0 in | Wt 204.0 lb

## 2023-06-15 DIAGNOSIS — M26622 Arthralgia of left temporomandibular joint: Secondary | ICD-10-CM

## 2023-06-15 DIAGNOSIS — M542 Cervicalgia: Secondary | ICD-10-CM | POA: Diagnosis not present

## 2023-06-15 DIAGNOSIS — R0981 Nasal congestion: Secondary | ICD-10-CM | POA: Diagnosis not present

## 2023-06-15 DIAGNOSIS — J3089 Other allergic rhinitis: Secondary | ICD-10-CM

## 2023-06-15 DIAGNOSIS — J342 Deviated nasal septum: Secondary | ICD-10-CM

## 2023-06-15 DIAGNOSIS — M5412 Radiculopathy, cervical region: Secondary | ICD-10-CM | POA: Diagnosis not present

## 2023-06-15 DIAGNOSIS — R0982 Postnasal drip: Secondary | ICD-10-CM

## 2023-06-15 DIAGNOSIS — G43009 Migraine without aura, not intractable, without status migrainosus: Secondary | ICD-10-CM

## 2023-06-15 MED ORDER — DESLORATADINE 5 MG PO TABS
5.0000 mg | ORAL_TABLET | Freq: Every day | ORAL | 3 refills | Status: DC
  Filled 2023-06-15 – 2024-01-16 (×2): qty 90, 90d supply, fill #0

## 2023-06-15 MED ORDER — FLUTICASONE PROPIONATE 50 MCG/ACT NA SUSP
2.0000 | Freq: Every day | NASAL | 6 refills | Status: DC
  Filled 2023-06-15: qty 16, 30d supply, fill #0

## 2023-06-15 NOTE — Patient Instructions (Addendum)
-   schedule MRI of the cervical spine  - schedule appointment to see Ortho/Spine - start allergy pill and Flonase (Rx sent) - see neurology for migraines  - see handout about TMJ pain  - return to see [place] as needed   TMJ (Temporomandibular Joint Syndrome) The temporomandibular (tem-puh-[id number]-man-DIB-u-lur) joint (TMJ) acts like a sliding hinge, connecting your jawbone to your skull. You have one joint on each side of your jaw. TMJ disorders -- a type of temporomandibular disorder or TMD -- can cause pain in your jaw joint and in the muscles that control jaw movement.  The exact cause of a person's TMJ disorder is often difficult to determine. Your pain may be due to a combination of factors, such as genetics, arthritis or jaw injury. Some people who have jaw pain also tend to clench or grind their teeth (bruxism), although many people habitually clench or grind their teeth and never develop TMJ disorders.  In most cases, the pain and discomfort associated with TMJ disorders is temporary and can be relieved with self-managed care or nonsurgical treatments. This includes stress reduction, softer diet when the pain is present, anti-inflammatory pain medications such as Motrin and warm compresses.
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
## 2023-06-15 NOTE — Telephone Encounter (Signed)
Pt called requesting an appt for her migraines that are being caused when she has neck pain and stiffness in the body. She states her headaches have gotten worse or as bad as they were before. Please check with NP if ok to see pt on scheduled appt.

## 2023-06-15 NOTE — Telephone Encounter (Signed)
Call to patient, she states her headaches are worse with the neck pain and body stiffness, she was seen in the ER over the weekend and states that CT showed "abnormalities and changes". She has an appointment with Amy Lomax on 06/21/23 that is also wait listed. Advised Amy is out of town, but I can send to [place] and inquire on any other recommendations. Patient appreciative of call. Understands we may not have an answer by end of day but will follow up with updates or recommendations.

## 2023-06-18 NOTE — Telephone Encounter (Signed)
I reviewed CT, ok to wait until appointment wit amy on 8/21 thanks

## 2023-06-19 ENCOUNTER — Other Ambulatory Visit: Payer: Self-pay | Admitting: *Deleted

## 2023-06-20 ENCOUNTER — Other Ambulatory Visit (HOSPITAL_COMMUNITY): Payer: Self-pay

## 2023-06-20 ENCOUNTER — Telehealth: Payer: Self-pay

## 2023-06-20 MED ORDER — LORATADINE 10 MG PO TABS
10.0000 mg | ORAL_TABLET | Freq: Every day | ORAL | 3 refills | Status: DC
  Filled 2023-06-20 – 2024-01-16 (×2): qty 30, 30d supply, fill #0
  Filled 2024-02-19: qty 30, 30d supply, fill #1

## 2023-06-20 NOTE — Addendum Note (Signed)
Addended byCurlene Dolphin, Willadeen Colantuono on: 06/20/2023 01:38 PM   Modules accepted: Orders
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
## 2023-06-20 NOTE — Telephone Encounter (Signed)
Received a Pre-Authorization on the above named insured for Desloratadine 5MG  tablets. Called [employer] and was told can use Claritin, Allegra, Xyzal, or Zyrtec.  However, these may be kicked out for [place] or needs PA due to you can purchase them OTC.  Sent message to [place]

## 2023-06-21 ENCOUNTER — Ambulatory Visit: Payer: Medicaid Other | Admitting: Family Medicine

## 2023-06-21 ENCOUNTER — Other Ambulatory Visit: Payer: Self-pay | Admitting: Family Medicine

## 2023-06-21 ENCOUNTER — Other Ambulatory Visit (HOSPITAL_COMMUNITY): Payer: Self-pay

## 2023-06-21 DIAGNOSIS — R220 Localized swelling, mass and lump, head: Secondary | ICD-10-CM

## 2023-06-21 DIAGNOSIS — G43009 Migraine without aura, not intractable, without status migrainosus: Secondary | ICD-10-CM

## 2023-06-21 DIAGNOSIS — M542 Cervicalgia: Secondary | ICD-10-CM

## 2023-06-21 MED ORDER — METHYLPREDNISOLONE 4 MG PO TBPK
ORAL_TABLET | ORAL | 0 refills | Status: DC
  Filled 2023-06-21: qty 21, 6d supply, fill #0

## 2023-06-23 ENCOUNTER — Other Ambulatory Visit: Payer: Self-pay | Admitting: Oncology

## 2023-06-23 DIAGNOSIS — Z006 Encounter for examination for normal comparison and control in clinical research program: Secondary | ICD-10-CM

## 2023-06-26 ENCOUNTER — Telehealth: Payer: Self-pay | Admitting: *Deleted

## 2023-06-26 DIAGNOSIS — R300 Dysuria: Secondary | ICD-10-CM

## 2023-06-26 DIAGNOSIS — R270 Ataxia, unspecified: Secondary | ICD-10-CM

## 2023-06-26 MED ORDER — METHYLPREDNISOLONE ACETATE 80 MG/ML IJ SUSP
80.0000 mg | Freq: Once | INTRAMUSCULAR | Status: DC
Start: 2023-06-26 — End: 2023-09-11

## 2023-06-26 NOTE — Telephone Encounter (Signed)
Allergic reaction to tomato based sauce at lunch... hives and itching all over after eating the tomato based hot sauce. Pt has taken Benadryl and Alegra Hives 24 hour tablet.   He said to send him a telephone note on the above and document the depo 80. Need Methylpredisone 80 mg (L) ear.

## 2023-06-29 ENCOUNTER — Other Ambulatory Visit (HOSPITAL_COMMUNITY): Payer: Self-pay

## 2023-07-04 ENCOUNTER — Ambulatory Visit (INDEPENDENT_AMBULATORY_CARE_PROVIDER_SITE_OTHER): Payer: Medicaid Other | Admitting: Student

## 2023-07-04 ENCOUNTER — Other Ambulatory Visit (HOSPITAL_COMMUNITY): Payer: Self-pay

## 2023-07-04 ENCOUNTER — Other Ambulatory Visit (HOSPITAL_BASED_OUTPATIENT_CLINIC_OR_DEPARTMENT_OTHER): Payer: Self-pay

## 2023-07-04 VITALS — BP 110/76 | HR 83 | Wt 200.0 lb

## 2023-07-04 DIAGNOSIS — Z6835 Body mass index (BMI) 35.0-35.9, adult: Secondary | ICD-10-CM | POA: Diagnosis not present

## 2023-07-04 DIAGNOSIS — M5412 Radiculopathy, cervical region: Secondary | ICD-10-CM

## 2023-07-04 DIAGNOSIS — E6609 Other obesity due to excess calories: Secondary | ICD-10-CM

## 2023-07-04 MED ORDER — CYCLOBENZAPRINE HCL 10 MG PO TABS
10.0000 mg | ORAL_TABLET | Freq: Three times a day (TID) | ORAL | 0 refills | Status: AC | PRN
  Filled 2023-07-04: qty 60, 20d supply, fill #0

## 2023-07-04 NOTE — Progress Notes (Unsigned)
    SUBJECTIVE:   CHIEF COMPLAINT / HPI:   Weight Loss Efforts Has had great success with cutting carbs, emphasizing protein, and cutting out sugary drinks. Weight is down 23 lbs in 4 months! Also has introduced resistance training. Is feeling great! Never did get semaglutide coverage despite demonstrated insulin resistance, but feeling well nonetheless. Mood is much improved. Has been augmenting efforts with psychotherapy which has been quite helpful.  Chronic Neck Pain Longstanding issue, seen by multiple specialists through the years including PT and neurolgy. Has known foraminal narrowing at C5-C6, C6-C7, and C7-T1 as demonstrated on MR in 2022. Has had epidural injections with Dr. Jordan Likes (PM&R). Awaiting repeat MRI next Tuesday and referred to [facility]. Flexeril helps, requesting refill. No acute changes to her symptoms.    OBJECTIVE:   BP 110/76   Pulse 83   Wt 200 lb (90.7 kg)   LMP  (LMP Unknown)   SpO2 100%   BMI 32.28 kg/m   Physical Exam Vitals reviewed.  Constitutional:      General: She is not in acute distress.    Comments: In good spirits  HENT:     Mouth/Throat:     Comments: Oropharynx is clear, without exudate or tonsillar hypertrophy  Neck:     Comments: FROM about the neck in all planes, does have some pain at extreme ends of ROM, worst with leftward rotation. No obvious edema or deformity about the neck. No lymphadenopathy.  Cardiovascular:     Rate and Rhythm: Normal rate and regular rhythm.     Heart sounds: No murmur heard. Pulmonary:     Effort: Pulmonary effort is normal.     Breath sounds: No wheezing, rhonchi or rales.  Psychiatric:        Mood and Affect: Mood normal.      ASSESSMENT/PLAN:   Obesity Losing weight at an appropriate page with healthful lifestyle changes. Pleased with her progress. Glad she's incorporating therapy as well.  - Continue high protein, low carb diet, resistance training, and therapy. Anticipate  continued good results.   Cervical radiculopathy Awaiting repeat MR on 9/10. Has been referred to neurosurgery. Eager to hear their thoughts. - I have refilled her flexeril given this seems to work well for her. She's young and generally healthy, this seems to be a good option for her.      Eliezer Mccoy, MD [facility]

## 2023-07-04 NOTE — Patient Instructions (Addendum)
First, CONGRATS on your weight loss. You're killing it. Keep up the protein and the weight lifting and I think you'll continue to see progress. The therapy also helps, of course. Let me know how else I can help.  I'm refilling your Flexeril. Glad neurosurgery will see you. I'm eager to hear their thoughts.  Your CT head/neck is overall reassuring. I do not think the hemangioma is contributing, but if neurosurgery feels otherwise, they would be the experts.   Eliezer Mccoy, MD

## 2023-07-05 ENCOUNTER — Other Ambulatory Visit (INDEPENDENT_AMBULATORY_CARE_PROVIDER_SITE_OTHER): Payer: Self-pay | Admitting: Physician Assistant

## 2023-07-05 ENCOUNTER — Other Ambulatory Visit (HOSPITAL_COMMUNITY): Payer: Self-pay

## 2023-07-05 DIAGNOSIS — M542 Cervicalgia: Secondary | ICD-10-CM

## 2023-07-06 NOTE — Assessment & Plan Note (Signed)
Losing weight at an appropriate page with healthful lifestyle changes. Pleased with her progress. Glad she's incorporating therapy as well.  - Continue high protein, low carb diet, resistance training, and therapy. Anticipate continued good results.

## 2023-07-06 NOTE — Assessment & Plan Note (Addendum)
Awaiting repeat MR on 9/10. Has been referred to neurosurgery. Eager to hear their thoughts. - I have refilled her flexeril given this seems to work well for her. She's young and generally healthy, this seems to be a good option for her.

## 2023-07-11 ENCOUNTER — Ambulatory Visit (HOSPITAL_COMMUNITY): Payer: Medicaid Other

## 2023-07-11 ENCOUNTER — Encounter (HOSPITAL_COMMUNITY): Payer: Self-pay

## 2023-07-11 ENCOUNTER — Ambulatory Visit (HOSPITAL_COMMUNITY)
Admission: RE | Admit: 2023-07-11 | Discharge: 2023-07-11 | Disposition: A | Discharge status: Home / Self Care | Payer: Medicaid Other | Source: Ambulatory Visit | Attending: Physician Assistant | Admitting: Physician Assistant

## 2023-07-11 DIAGNOSIS — M542 Cervicalgia: Secondary | ICD-10-CM | POA: Diagnosis present

## 2023-07-12 ENCOUNTER — Ambulatory Visit: Payer: Medicaid Other | Admitting: Orthopedic Surgery

## 2023-07-17 NOTE — Progress Notes (Signed)
error 

## 2023-07-20 ENCOUNTER — Telehealth: Payer: Self-pay

## 2023-07-20 NOTE — Telephone Encounter (Signed)
error 
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
## 2023-07-24 ENCOUNTER — Telehealth: Payer: Self-pay

## 2023-07-24 NOTE — Telephone Encounter (Signed)
Called patient regarding $50 form fee - LVM for patient requesting a call back

## 2023-07-27 ENCOUNTER — Ambulatory Visit: Payer: Medicaid Other | Admitting: Orthopedic Surgery

## 2023-07-27 ENCOUNTER — Encounter: Payer: Self-pay | Admitting: Orthopedic Surgery

## 2023-07-27 VITALS — BP 118/78 | Ht 66.0 in | Wt 197.0 lb

## 2023-07-27 DIAGNOSIS — M4722 Other spondylosis with radiculopathy, cervical region: Secondary | ICD-10-CM

## 2023-07-27 DIAGNOSIS — M4802 Spinal stenosis, cervical region: Secondary | ICD-10-CM

## 2023-07-27 DIAGNOSIS — M25512 Pain in left shoulder: Secondary | ICD-10-CM

## 2023-07-27 DIAGNOSIS — R200 Anesthesia of skin: Secondary | ICD-10-CM | POA: Diagnosis not present

## 2023-07-27 DIAGNOSIS — R202 Paresthesia of skin: Secondary | ICD-10-CM

## 2023-07-27 DIAGNOSIS — M47812 Spondylosis without myelopathy or radiculopathy, cervical region: Secondary | ICD-10-CM

## 2023-07-27 DIAGNOSIS — M5412 Radiculopathy, cervical region: Secondary | ICD-10-CM

## 2023-07-27 NOTE — Patient Instructions (Signed)
It was so nice to see you today. Thank you so much for coming in.    You have some wear and tear in your neck- this may be causing some of your pain.   I think some of your pain may be from your left shoulder as well. I want you to see ortho for this. They should call you.   I ordered xrays of your neck. You can get these at [facility] (building with the white pillars) off of [address]. The address is [address], [place], [place] [address]. You do not need any appointment. You can also get at any [facility] facility.   I want to get an EMG (nerve conduction test) to look into things further. I have ordered this and [facility] will call you to schedule.   Once I get the xray and EMG results back, we will call you to schedule a follow up. Please do not hesitate to call if you have any questions or concerns. You can also message me in MyChart.   Drake Leach PA-C [phone]

## 2023-08-14 ENCOUNTER — Ambulatory Visit (HOSPITAL_BASED_OUTPATIENT_CLINIC_OR_DEPARTMENT_OTHER): Payer: Medicaid Other | Admitting: Orthopaedic Surgery

## 2023-08-25 ENCOUNTER — Ambulatory Visit
Admission: RE | Admit: 2023-08-25 | Discharge: 2023-08-25 | Disposition: A | Discharge status: Home / Self Care | Payer: Medicaid Other | Source: Ambulatory Visit | Attending: Orthopedic Surgery | Admitting: Orthopedic Surgery

## 2023-08-25 ENCOUNTER — Ambulatory Visit (HOSPITAL_BASED_OUTPATIENT_CLINIC_OR_DEPARTMENT_OTHER): Payer: Medicaid Other | Admitting: Orthopaedic Surgery

## 2023-08-25 ENCOUNTER — Other Ambulatory Visit (HOSPITAL_COMMUNITY)
Admission: RE | Admit: 2023-08-25 | Discharge: 2023-08-25 | Disposition: A | Discharge status: Home / Self Care | Payer: Medicaid Other | Source: Ambulatory Visit | Attending: Oncology | Admitting: Oncology

## 2023-08-25 DIAGNOSIS — G2589 Other specified extrapyramidal and movement disorders: Secondary | ICD-10-CM

## 2023-08-25 DIAGNOSIS — M5412 Radiculopathy, cervical region: Secondary | ICD-10-CM

## 2023-08-25 DIAGNOSIS — R200 Anesthesia of skin: Secondary | ICD-10-CM

## 2023-08-25 DIAGNOSIS — Z006 Encounter for examination for normal comparison and control in clinical research program: Secondary | ICD-10-CM | POA: Insufficient documentation

## 2023-08-25 DIAGNOSIS — M47812 Spondylosis without myelopathy or radiculopathy, cervical region: Secondary | ICD-10-CM
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
## 2023-08-28 ENCOUNTER — Emergency Department (HOSPITAL_BASED_OUTPATIENT_CLINIC_OR_DEPARTMENT_OTHER): Payer: Medicaid Other

## 2023-08-28 ENCOUNTER — Other Ambulatory Visit: Payer: Self-pay

## 2023-08-28 ENCOUNTER — Emergency Department (HOSPITAL_BASED_OUTPATIENT_CLINIC_OR_DEPARTMENT_OTHER)
Admission: EM | Admit: 2023-08-28 | Discharge: 2023-08-28 | Disposition: A | Discharge status: Home / Self Care | Payer: Medicaid Other | Attending: Emergency Medicine | Admitting: Emergency Medicine

## 2023-08-28 ENCOUNTER — Encounter (HOSPITAL_BASED_OUTPATIENT_CLINIC_OR_DEPARTMENT_OTHER): Payer: Self-pay | Admitting: Emergency Medicine

## 2023-08-28 ENCOUNTER — Ambulatory Visit: Payer: Medicaid Other

## 2023-08-28 DIAGNOSIS — R42 Dizziness and giddiness: Secondary | ICD-10-CM | POA: Diagnosis present

## 2023-08-28 DIAGNOSIS — R1031 Right lower quadrant pain: Secondary | ICD-10-CM | POA: Insufficient documentation

## 2023-08-28 DIAGNOSIS — J45909 Unspecified asthma, uncomplicated: Secondary | ICD-10-CM | POA: Insufficient documentation

## 2023-08-28 LAB — URINALYSIS, ROUTINE W REFLEX MICROSCOPIC
Bilirubin Urine: NEGATIVE
Glucose, UA: NEGATIVE mg/dL
Ketones, ur: NEGATIVE mg/dL
Leukocytes,Ua: NEGATIVE
Nitrite: NEGATIVE
Specific Gravity, Urine: 1.031 — ABNORMAL HIGH (ref 1.005–1.030)
pH: 6.5 (ref 5.0–8.0)

## 2023-08-28 LAB — COMPREHENSIVE METABOLIC PANEL
ALT: 13 U/L (ref 0–44)
AST: 13 U/L — ABNORMAL LOW (ref 15–41)
Albumin: 4.5 g/dL (ref 3.5–5.0)
Alkaline Phosphatase: 40 U/L (ref 38–126)
Anion gap: 5 (ref 5–15)
BUN: 10 mg/dL (ref 6–20)
CO2: 28 mmol/L (ref 22–32)
Calcium: 9.6 mg/dL (ref 8.9–10.3)
Chloride: 105 mmol/L (ref 98–111)
Creatinine, Ser: 0.69 mg/dL (ref 0.44–1.00)
GFR, Estimated: >60 mL/min (ref >60)
Glucose, Bld: 98 mg/dL (ref 70–99)
Potassium: 3.8 mmol/L (ref 3.5–5.1)
Sodium: 138 mmol/L (ref 135–145)
Total Bilirubin: 0.5 mg/dL (ref 0.3–1.2)
Total Protein: 7 g/dL (ref 6.5–8.1)

## 2023-08-28 LAB — CBC
HCT: 39.3 % (ref 36.0–46.0)
Hemoglobin: 13.6 g/dL (ref 12.0–15.0)
MCH: 30.7 pg (ref 26.0–34.0)
MCHC: 34.6 g/dL (ref 30.0–36.0)
MCV: 88.7 fL (ref 80.0–100.0)
Platelets: 251 10*3/uL (ref 150–400)
RBC: 4.43 MIL/uL (ref 3.87–5.11)
RDW: 12.8 % (ref 11.5–15.5)
WBC: 5.3 10*3/uL (ref 4.0–10.5)
nRBC: 0 % (ref 0.0–0.2)

## 2023-08-28 LAB — CBG MONITORING, ED: Glucose-Capillary: 74 mg/dL (ref 70–99)

## 2023-08-28 LAB — LIPASE, BLOOD: Lipase: 34 U/L (ref 11–51)

## 2023-08-28 MED ORDER — ONDANSETRON HCL 4 MG/2ML IJ SOLN
4.0000 mg | Freq: Once | INTRAMUSCULAR | Status: AC
  Administered 2023-08-28: 4 mg via INTRAVENOUS
  Filled 2023-08-28: qty 2

## 2023-08-28 MED ORDER — MORPHINE SULFATE (PF) 4 MG/ML IV SOLN
4.0000 mg | Freq: Once | INTRAVENOUS | Status: AC
  Administered 2023-08-28: 4 mg via INTRAVENOUS
  Filled 2023-08-28: qty 1

## 2023-08-28 NOTE — ED Provider Notes (Signed)
[place] EMERGENCY DEPARTMENT AT [facility] Provider Note   CSN: [id number] Arrival date & time: 08/28/23  1314     History  Chief Complaint  Patient presents with   Dizziness    Meghan Reed is a 37 y.o. female with history of asthma, PUD, right-sided nephrolithiasis 2024,  prior PE 2020, prior hysterectomy presents with complaints of 2-week onset of dizziness and right flank pain that started earlier today radiating into right lower quadrant abdomen.  Dizziness is worse with head movement.  Denies any relieving factors.  States she has not taken any medication for the symptoms. Patient states she experienced similar dizziness earlier this year.  She had blood work performed by her PCP which showed low iron.  She was seen by a hematologist and received 4 iron transfusions over the course of a month.  This seemed to improve her dizziness.  She was also noted to have an elevated D-dimer.  She saw a pulmonologist.  CT chest was performed which was reportedly negative. No source for the elevated D-dimer was found.    Dizziness      Home Medications Prior to Admission medications   Medication Sig Start Date End Date Taking? Authorizing Provider  albuterol (PROVENTIL) (2.5 MG/3ML) 0.083% nebulizer solution Take 3 mLs (2.5 mg total) by nebulization every 6 (six) hours as needed for wheezing or shortness of breath. 10/15/21   Pincus Sanes, MD  albuterol (VENTOLIN HFA) 108 (90 Base) MCG/ACT inhaler Inhale 2 puffs into the lungs every 4 (four) hours as needed for wheezing or shortness of breath. 12/12/22   Omar Person, MD  budesonide-formoterol [facility]) 160-4.5 MCG/ACT inhaler Inhale 2 puffs into the lungs 2 (two) times daily. 12/12/22   Omar Person, MD  cyclobenzaprine (FLEXERIL) 10 MG tablet Take 1 tablet (10 mg total) by mouth every 8 (eight) hours as needed for muscle spasms. 07/04/23   Alicia Amel, MD  desloratadine (CLARINEX) 5 MG tablet  Take 1 tablet (5 mg total) by mouth daily. 06/15/23   Ashok Croon, MD  ferrous sulfate 325 (65 FE) MG tablet Take 1 tablet (325 mg total) by mouth daily with breakfast. Please take with a source of Vitamin C 02/16/23   Jaci Standard, MD  fluticasone (FLONASE) 50 MCG/ACT nasal spray Place 2 sprays into both nostrils daily. 06/15/23   Ashok Croon, MD  Galcanezumab-gnlm (EMGALITY) 120 MG/ML SOAJ Inject 120 mg into the skin every 30 days. 05/23/23   Lomax, Amy, NP  loratadine (CLARITIN) 10 MG tablet Take 1 tablet (10 mg total) by mouth daily. 06/20/23   Hedges, Tinnie Gens, PA-C  omeprazole (PRILOSEC OTC) 20 MG tablet Take 1 tablet (20 mg total) by mouth daily. Patient taking differently: Take 20 mg by mouth daily as needed (acid reflux). 02/25/22   Autry-Lott, Randa Evens, DO  ondansetron (ZOFRAN) 4 MG tablet Take 1 tablet (4 mg total) by mouth every 8 (eight) hours as needed for nausea or vomiting. 11/22/22   Burns, Bobette Mo, MD  promethazine (PHENERGAN) 25 MG tablet Take 1 tablet (25 mg total) by mouth every 6 (six) hours as needed for up to 10 doses for nausea or vomiting. 03/22/23   Claude Manges, PA-C  pseudoephedrine (SUDAFED) 120 MG 12 hr tablet Take 1 tablet (120 mg total) by mouth 2 (two) times daily as needed for congestion. 11/29/22 11/29/23  Plotnikov, Georgina Quint, MD  Ubrogepant (UBRELVY) 100 MG TABS Take 1 tablet (100 mg) by mouth daily as needed. 05/23/23  Lomax, Amy, NP      Allergies    Tomato, Aspirin, and Nsaids    Review of Systems   Review of Systems  Gastrointestinal:  Positive for abdominal pain.  Genitourinary:  Positive for flank pain. Negative for dysuria, frequency and hematuria.  Neurological:  Positive for dizziness.    Physical Exam Updated Vital Signs BP 108/75   Pulse 63   Temp 97.8 F (36.6 C)   Resp (!) 24   Wt 88.9 kg   LMP  (LMP Unknown)   SpO2 100%   BMI 31.64 kg/m  Physical Exam Vitals and nursing note reviewed.  Constitutional:      General: She is not  in acute distress.    Appearance: She is well-developed.  HENT:     Head: Normocephalic and atraumatic.  Eyes:     Conjunctiva/sclera: Conjunctivae normal.  Cardiovascular:     Rate and Rhythm: Normal rate and regular rhythm.     Heart sounds: No murmur heard. Pulmonary:     Effort: Pulmonary effort is normal. No respiratory distress.     Breath sounds: Normal breath sounds.  Abdominal:     Palpations: Abdomen is soft.     Tenderness: There is right CVA tenderness.     Comments: Right lower quadrant tenderness, no rebound or guarding  Musculoskeletal:        General: No swelling.     Cervical back: Neck supple.  Skin:    General: Skin is warm and dry.     Capillary Refill: Capillary refill takes less than 2 seconds.  Neurological:     Mental Status: She is alert.  Psychiatric:        Mood and Affect: Mood normal.     ED Results / Procedures / Treatments   Labs (all labs ordered are listed, but only abnormal results are displayed) Labs Reviewed  COMPREHENSIVE METABOLIC PANEL - Abnormal; Notable for the following components:      Result Value   AST 13 (*)    All other components within normal limits  URINALYSIS, ROUTINE W REFLEX MICROSCOPIC - Abnormal; Notable for the following components:   Specific Gravity, Urine 1.031 (*)    Hgb urine dipstick SMALL (*)    Protein, ur TRACE (*)    Bacteria, UA RARE (*)    All other components within normal limits  LIPASE, BLOOD  CBC  CBG MONITORING, ED    EKG None  Radiology CT ABDOMEN PELVIS WO CONTRAST  Result Date: 08/28/2023 CLINICAL DATA:  Right-sided flank pain.  Prior hysterectomy. EXAM: CT ABDOMEN AND PELVIS WITHOUT CONTRAST TECHNIQUE: Multidetector CT imaging of the abdomen and pelvis was performed following the standard protocol without IV contrast. RADIATION DOSE REDUCTION: This exam was performed according to the departmental dose-optimization program which includes automated exposure control, adjustment of the mA  and/or kV according to patient size and/or use of iterative reconstruction technique. COMPARISON:  CT 03/22/2023.  Older exams as well. FINDINGS: Lower chest: Slight linear opacity lung bases likely scar or atelectasis. No pleural effusion. Hepatobiliary: As seen previously there are some small low-attenuation liver lesions consistent with a benign cystic foci, unchanged from previous. Gallbladder is nondilated. Pancreas: Unremarkable. No pancreatic ductal dilatation or surrounding inflammatory changes. Spleen: Normal in size without focal abnormality. Adrenals/Urinary Tract: The adrenal glands are unremarkable. No renal or ureteral stones. No renal collecting system dilatation. Preserved contours of the underdistended urinary bladder. Stomach/Bowel: On this non oral contrast exam large bowel has a normal course and  caliber with scattered stool. Normal appendix. The stomach and small bowel are nondilated. Vascular/Lymphatic: Aortic atherosclerosis. No enlarged abdominal or pelvic lymph nodes. Reproductive: Uterus is absent. No adnexal mass. Surgical clips in the pelvis. Other: No free air or free fluid. Musculoskeletal: No acute or significant osseous findings. IMPRESSION: No obstructing renal stone. No bowel obstruction, free air or free fluid. Normal appendix. Scattered stool. Electronically Signed   By: Karen Kays M.D.   On: 08/28/2023 18:01    Procedures Procedures    Medications Ordered in ED Medications  ondansetron (ZOFRAN) injection 4 mg (4 mg Intravenous Given 08/28/23 1539)  morphine (PF) 4 MG/ML injection 4 mg (4 mg Intravenous Given 08/28/23 1538)    ED Course/ Medical Decision Making/ A&P Clinical Course as of 08/28/23 1945  Mon Aug 28, 2023  1600 Comprehensive metabolic panel(!) Without significant abnormality [JT]  1601 Lipase, blood Not suggestive of pancreatitis [JT]  1601 CBC Without abnormality [JT]  1614 Urinalysis, Routine w reflex microscopic -Urine, Clean  Catch(!) Without evidence of infection [JT]    Clinical Course User Index [JT] Halford Decamp, PA-C                                 Medical Decision Making Alaja Kanis Scruggs-Hernandez is a 37 y.o. female with history of asthma, PUD, right-sided nephrolithiasis 2024,  prior PE 2020, prior hysterectomy presents with complaints of 2-week onset of dizziness and right flank pain that started earlier today radiating into right lower quadrant abdomen.  Differential includes BPPV, otitis media, central vertigo, PE, nephrolithiasis, appendicitis, cholecystitis.  Patient is accompanied by her daughter.  External records include prior PCP notes.  EKG demonstrated normal sinus rhythm with nonspecific T abnormalities. CBC does not demonstrate anemia as patient has historically had with this dizziness.  UA not consistent with UTI.  Physical exam is reassuring against central vertigo.  She does have right CVAT radiating into her right lower quadrant.  CT abdomen pelvis dry ordered to evaluate for nephrolithiasis and appendicitis.  This was without abnormality.  Upon reevaluation the patient's symptoms had improved but she still had some residual right lower abdominal pain.  A discussion was had with the patient regarding whether to pursue a pelvic exam.  Shared decision was made to defer the exam given history of hysterectomy and unremarkable workup so far.  No critical interventions or consultations were indicated.  No social determinants of health.  She has remained hemodynamically stable and is safe for discharge.  She was encouraged to return to the ED should she experience any new or worsening symptoms.   Amount and/or Complexity of Data Reviewed Labs: ordered.           Final Clinical Impression(s) / ED Diagnoses Final diagnoses:  Vertigo  Right lower quadrant abdominal pain    Rx / DC Orders ED Discharge Orders     None         Fabienne Bruns 08/28/23 1945     Rondel Baton, MD 08/29/23 1359

## 2023-08-28 NOTE — ED Triage Notes (Signed)
Pt c/o dizziness and nausea x 4 days. Also c/o RT flank today. PT aox4

## 2023-08-28 NOTE — ED Notes (Signed)
Discharge instructions reviewed with patient. Patient questions answered and opportunity for education reviewed. Patient voices understanding of discharge instructions with no further questions. Patient ambulatory with steady gait to lobby.  

## 2023-08-28 NOTE — Discharge Instructions (Addendum)
You presented to the ED with complaints of dizziness, right flank and right lower abdominal pain.  You were given Zofran for nausea and morphine for pain. A CT scan of your abdomen and pelvis was performed which did not show any acute finding.  An EKG was performed to evaluate your heart which did not show any concerning findings.  Your dizziness clinically is consistent with vertigo which can often be treated with the Epley maneuver at home.  Although your abdominal workup was unremarkable please return to the ED if you experience any new or worsening symptoms including fevers, chills, vomiting, worsening abdominal pain.

## 2023-08-29 ENCOUNTER — Encounter: Payer: Self-pay | Admitting: Student

## 2023-09-01 ENCOUNTER — Telehealth: Payer: Self-pay | Admitting: Orthopedic Surgery

## 2023-09-01 NOTE — Telephone Encounter (Signed)
Can we let them know we just want the EMG? Will they do EMGs at [facility]?

## 2023-09-01 NOTE — Telephone Encounter (Signed)
Referral switched to [facility]que.

## 2023-09-01 NOTE — Telephone Encounter (Signed)
I ordered EMG for her at [facility] at the end of September and I don't see an appointment scheduled. Can you look into this please?

## 2023-09-01 NOTE — Telephone Encounter (Signed)
[place] documented in the referral "Pt Is est with [facility]". I'm guessing they did not schedule her because she is established with a neurology office already.

## 2023-09-04 LAB — HELIX MOLECULAR SCREEN: Genetic Analysis Overall Interpretation: NEGATIVE

## 2023-09-04 LAB — GENECONNECT MOLECULAR SCREEN

## 2023-09-06 ENCOUNTER — Telehealth: Payer: Self-pay | Admitting: Student

## 2023-09-06 ENCOUNTER — Other Ambulatory Visit (HOSPITAL_COMMUNITY): Payer: Self-pay

## 2023-09-06 MED ORDER — HYDROCODONE BIT-HOMATROP MBR 5-1.5 MG/5ML PO SOLN
5.0000 mL | Freq: Four times a day (QID) | ORAL | 0 refills | Status: DC | PRN
Start: 2023-09-06 — End: 2023-12-06
  Filled 2023-09-06: qty 206, 10d supply, fill #0
  Filled 2023-09-06: qty 34, 2d supply, fill #0

## 2023-09-06 NOTE — Telephone Encounter (Signed)
Its done. Feel better! Thx

## 2023-09-06 NOTE — Telephone Encounter (Signed)
Pt is having extreme coughing issues due to her asthma and is requesting an RX to help her with this issue as soon as possible.   Please send RX to:  [facility]  Phone: [phone]  Fax: [phone]

## 2023-09-11 ENCOUNTER — Ambulatory Visit (INDEPENDENT_AMBULATORY_CARE_PROVIDER_SITE_OTHER): Payer: Medicaid Other | Admitting: Family Medicine

## 2023-09-11 ENCOUNTER — Encounter: Payer: Self-pay | Admitting: Family Medicine

## 2023-09-11 ENCOUNTER — Other Ambulatory Visit (HOSPITAL_COMMUNITY): Payer: Self-pay

## 2023-09-11 VITALS — BP 102/66 | HR 60 | Temp 98.7°F | Resp 20 | Ht 66.0 in | Wt 196.0 lb

## 2023-09-11 DIAGNOSIS — B9689 Other specified bacterial agents as the cause of diseases classified elsewhere: Secondary | ICD-10-CM | POA: Diagnosis not present

## 2023-09-11 DIAGNOSIS — J988 Other specified respiratory disorders: Secondary | ICD-10-CM | POA: Diagnosis not present

## 2023-09-11 LAB — POCT RAPID STREP A (OFFICE): Rapid Strep A Screen: NEGATIVE

## 2023-09-11 MED ORDER — FLUCONAZOLE 150 MG PO TABS
150.0000 mg | ORAL_TABLET | Freq: Once | ORAL | 0 refills | Status: AC
Start: 2023-09-11 — End: 2023-09-11
  Filled 2023-09-11: qty 2, 3d supply, fill #0

## 2023-09-11 MED ORDER — AZITHROMYCIN 250 MG PO TABS
ORAL_TABLET | ORAL | 0 refills | Status: AC
Start: 2023-09-11 — End: 2023-09-15
  Filled 2023-09-11: qty 6, 5d supply, fill #0

## 2023-09-11 NOTE — Patient Instructions (Signed)
Throat lozenges, chloraseptic spray, warm salt water gargles, hot tea/honey, cough syrup (Delsym), Tylenol/Ibuprofen, Vicks, and a humidifier at night.  

## 2023-09-11 NOTE — Progress Notes (Signed)
Assessment & Plan:  1. Bacterial respiratory infection Encouraged symptom management including throat lozenges, chloraseptic spray, warm salt water gargles, hot tea/honey, cough syrup (Delsym), Tylenol/Ibuprofen, Vicks, and a humidifier at night. Diflucan prescribed due to history of yeast infections when she takes antibiotics. Advised not to take unless she develops symptoms. - POCT rapid strep A - fluconazole (DIFLUCAN) 150 MG tablet; Take 1 tablet (150 mg total) by mouth once for 1 dose. May repeat after 3 days if needed.  Dispense: 2 tablet; Refill: 0 - azithromycin (ZITHROMAX) 250 MG tablet; Take 2 tablets (500 mg total) by mouth daily for 1 day, THEN 1 tablet (250 mg total) daily for 4 days.  Dispense: 6 each; Refill: 0  Results for orders placed or performed in visit on 09/11/23  POCT rapid strep A  Result Value Ref Range   Rapid Strep A Screen Negative Negative    Follow up plan: Return if symptoms worsen or fail to improve.  Deliah Boston, MSN, APRN, FNP-C  Subjective:  HPI: Meghan Reed is a 37 y.o. female presenting on 09/11/2023 for Sore Throat (ST, hoarse, HA, cough (from asthma) /X 1week )  Patient complains of cough, headache, runny nose, sneezing, sore throat, fever, postnasal drainage, shortness of breath, wheezing, and swollen lymph nodes. Onset of symptoms was 1 week ago, gradually worsening since that time. She is drinking plenty of fluids. Evaluation to date: none. Treatment to date:  Hycodan and inhalers . She has a history of asthma. She does not smoke.    ROS: Negative unless specifically indicated above in HPI.   Relevant past medical history reviewed and updated as indicated.   Allergies and medications reviewed and updated.   Current Outpatient Medications:    albuterol (PROVENTIL) (2.5 MG/3ML) 0.083% nebulizer solution, Take 3 mLs (2.5 mg total) by nebulization every 6 (six) hours as needed for wheezing or shortness of breath., Disp:  150 mL, Rfl: 1   albuterol (VENTOLIN HFA) 108 (90 Base) MCG/ACT inhaler, Inhale 2 puffs into the lungs every 4 (four) hours as needed for wheezing or shortness of breath., Disp: 18 g, Rfl: 6   budesonide-formoterol (SYMBICORT) 160-4.5 MCG/ACT inhaler, Inhale 2 puffs into the lungs 2 (two) times daily., Disp: 10.2 g, Rfl: 11   cyclobenzaprine (FLEXERIL) 10 MG tablet, Take 1 tablet (10 mg total) by mouth every 8 (eight) hours as needed for muscle spasms., Disp: 60 tablet, Rfl: 0   desloratadine (CLARINEX) 5 MG tablet, Take 1 tablet (5 mg total) by mouth daily., Disp: 90 tablet, Rfl: 3   ferrous sulfate 325 (65 FE) MG tablet, Take 1 tablet (325 mg total) by mouth daily with breakfast. Please take with a source of Vitamin C, Disp: 90 tablet, Rfl: 3   fluticasone (FLONASE) 50 MCG/ACT nasal spray, Place 2 sprays into both nostrils daily., Disp: 16 g, Rfl: 6   Galcanezumab-gnlm (EMGALITY) 120 MG/ML SOAJ, Inject 120 mg into the skin every 30 days., Disp: 3 mL, Rfl: 3   HYDROcodone bit-homatropine (HYCODAN) 5-1.5 MG/5ML syrup, Take 5 mLs by mouth every 6 (six) hours as needed for cough., Disp: 240 mL, Rfl: 0   loratadine (CLARITIN) 10 MG tablet, Take 1 tablet (10 mg total) by mouth daily., Disp: 30 tablet, Rfl: 3   omeprazole (PRILOSEC OTC) 20 MG tablet, Take 1 tablet (20 mg total) by mouth daily. (Patient taking differently: Take 20 mg by mouth daily as needed (acid reflux).), Disp: 30 tablet, Rfl: 3   pseudoephedrine (SUDAFED) 120 MG 12  hr tablet, Take 1 tablet (120 mg total) by mouth 2 (two) times daily as needed for congestion., Disp: 60 tablet, Rfl: 1   Ubrogepant (UBRELVY) 100 MG TABS, Take 1 tablet (100 mg) by mouth daily as needed., Disp: 8 tablet, Rfl: 11   ondansetron (ZOFRAN) 4 MG tablet, Take 1 tablet (4 mg total) by mouth every 8 (eight) hours as needed for nausea or vomiting. (Patient not taking: Reported on 09/11/2023), Disp: 20 tablet, Rfl: 0   promethazine (PHENERGAN) 25 MG tablet, Take 1  tablet (25 mg total) by mouth every 6 (six) hours as needed for up to 10 doses for nausea or vomiting. (Patient not taking: Reported on 09/11/2023), Disp: 10 tablet, Rfl: 0  Allergies  Allergen Reactions   Tomato Anaphylaxis   Aspirin Other (See Comments)    Told to avoid due to history of stomach ulcers   Nsaids Other (See Comments)    Told to avoid due to history of stomach ulcers    Objective:   BP 102/66   Pulse 60   Temp 98.7 F (37.1 C)   Resp 20   Ht 5\' 6"  (1.676 m)   Wt 196 lb (88.9 kg)   LMP  (LMP Unknown)   BMI 31.64 kg/m    Physical Exam Vitals reviewed.  Constitutional:      General: She is not in acute distress.    Appearance: Normal appearance. She is not ill-appearing, toxic-appearing or diaphoretic.  HENT:     Head: Normocephalic and atraumatic.     Right Ear: Tympanic membrane, ear canal and external ear normal. There is no impacted cerumen.     Left Ear: Tympanic membrane, ear canal and external ear normal. There is no impacted cerumen.     Nose: Nose normal. No congestion or rhinorrhea.     Right Turbinates: Enlarged.     Left Turbinates: Enlarged.     Right Sinus: No maxillary sinus tenderness or frontal sinus tenderness.     Left Sinus: No maxillary sinus tenderness or frontal sinus tenderness.     Mouth/Throat:     Mouth: Mucous membranes are moist.     Pharynx: Oropharynx is clear. Posterior oropharyngeal erythema present. No oropharyngeal exudate.     Tonsils: 2+ on the right. 2+ on the left.  Eyes:     General: No scleral icterus.       Right eye: No discharge.        Left eye: No discharge.     Conjunctiva/sclera: Conjunctivae normal.  Cardiovascular:     Rate and Rhythm: Normal rate and regular rhythm.     Heart sounds: Normal heart sounds. No murmur heard.    No friction rub. No gallop.  Pulmonary:     Effort: Pulmonary effort is normal. No respiratory distress.     Breath sounds: Normal breath sounds. No stridor. No wheezing, rhonchi  or rales.  Musculoskeletal:        General: Normal range of motion.     Cervical back: Normal range of motion.  Lymphadenopathy:     Cervical: Cervical adenopathy present.  Skin:    General: Skin is warm and dry.     Capillary Refill: Capillary refill takes less than 2 seconds.  Neurological:     General: No focal deficit present.     Mental Status: She is alert and oriented to person, place, and time. Mental status is at baseline.  Psychiatric:        Mood and Affect: Mood normal.  Behavior: Behavior normal.        Thought Content: Thought content normal.        Judgment: Judgment normal.

## 2023-09-12 NOTE — Telephone Encounter (Signed)
Appt has not been scheduled yet.

## 2023-09-18 NOTE — Telephone Encounter (Signed)
No appt has been scheduled yet.

## 2023-09-22 ENCOUNTER — Other Ambulatory Visit (HOSPITAL_COMMUNITY): Payer: Self-pay

## 2023-10-12 ENCOUNTER — Ambulatory Visit (HOSPITAL_BASED_OUTPATIENT_CLINIC_OR_DEPARTMENT_OTHER): Payer: Medicaid Other | Admitting: Physical Therapy

## 2023-10-22 ENCOUNTER — Ambulatory Visit
Admission: EM | Admit: 2023-10-22 | Discharge: 2023-10-22 | Disposition: A | Discharge status: Home / Self Care | Payer: Medicaid Other | Attending: Physician Assistant | Admitting: Physician Assistant

## 2023-10-22 ENCOUNTER — Other Ambulatory Visit: Payer: Self-pay

## 2023-10-22 DIAGNOSIS — Z113 Encounter for screening for infections with a predominantly sexual mode of transmission: Secondary | ICD-10-CM | POA: Insufficient documentation

## 2023-10-22 DIAGNOSIS — N898 Other specified noninflammatory disorders of vagina: Secondary | ICD-10-CM | POA: Insufficient documentation

## 2023-10-22 DIAGNOSIS — R350 Frequency of micturition: Secondary | ICD-10-CM | POA: Insufficient documentation

## 2023-10-22 LAB — POCT URINALYSIS DIP (MANUAL ENTRY)
Bilirubin, UA: NEGATIVE
Glucose, UA: NEGATIVE mg/dL
Ketones, POC UA: NEGATIVE mg/dL
Leukocytes, UA: NEGATIVE
Nitrite, UA: NEGATIVE
Protein Ur, POC: NEGATIVE mg/dL
Spec Grav, UA: >=1.03 — AB (ref 1.010–1.025)
Urobilinogen, UA: 0.2 U/dL
pH, UA: 6.5 (ref 5.0–8.0)
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
## 2023-10-22 NOTE — ED Triage Notes (Signed)
Pt sts some lower abd pain and dysuria with frequency and vaginal discharge x 1 week since having intercourse last

## 2023-10-24 ENCOUNTER — Telehealth (HOSPITAL_COMMUNITY): Payer: Self-pay

## 2023-10-24 LAB — CERVICOVAGINAL ANCILLARY ONLY
Bacterial Vaginitis (gardnerella): POSITIVE — AB
Candida Glabrata: NEGATIVE
Candida Vaginitis: NEGATIVE
Chlamydia: NEGATIVE
Comment: NEGATIVE
Comment: NEGATIVE
Comment: NEGATIVE
Comment: NEGATIVE
Comment: NEGATIVE
Comment: NORMAL
Neisseria Gonorrhea: NEGATIVE
Trichomonas: NEGATIVE

## 2023-10-24 LAB — URINE CULTURE: Culture: NO GROWTH

## 2023-10-24 LAB — HIV ANTIBODY (ROUTINE TESTING W REFLEX): HIV Screen 4th Generation wRfx: NONREACTIVE

## 2023-10-24 LAB — RPR: RPR Ser Ql: NONREACTIVE

## 2023-10-24 MED ORDER — METRONIDAZOLE 500 MG PO TABS: 500.0000 mg | ORAL_TABLET | Freq: Two times a day (BID) | ORAL | 0 refills | Status: AC

## 2023-10-24 NOTE — Telephone Encounter (Signed)
Per protocol, pt requires tx with metronidazole. Rx sent to pharmacy on file.

## 2023-11-08 ENCOUNTER — Emergency Department (HOSPITAL_BASED_OUTPATIENT_CLINIC_OR_DEPARTMENT_OTHER): Payer: Medicaid Other | Admitting: Radiology

## 2023-11-08 ENCOUNTER — Other Ambulatory Visit: Payer: Self-pay

## 2023-11-08 ENCOUNTER — Emergency Department (HOSPITAL_BASED_OUTPATIENT_CLINIC_OR_DEPARTMENT_OTHER)
Admission: EM | Admit: 2023-11-08 | Discharge: 2023-11-08 | Disposition: A | Discharge status: Home / Self Care | Payer: Medicaid Other | Attending: Emergency Medicine | Admitting: Emergency Medicine

## 2023-11-08 ENCOUNTER — Emergency Department (HOSPITAL_BASED_OUTPATIENT_CLINIC_OR_DEPARTMENT_OTHER): Payer: Medicaid Other

## 2023-11-08 DIAGNOSIS — R519 Headache, unspecified: Secondary | ICD-10-CM | POA: Insufficient documentation

## 2023-11-08 DIAGNOSIS — Z20822 Contact with and (suspected) exposure to covid-19: Secondary | ICD-10-CM | POA: Insufficient documentation

## 2023-11-08 DIAGNOSIS — R059 Cough, unspecified: Secondary | ICD-10-CM | POA: Diagnosis not present

## 2023-11-08 DIAGNOSIS — J45909 Unspecified asthma, uncomplicated: Secondary | ICD-10-CM | POA: Diagnosis not present

## 2023-11-08 DIAGNOSIS — R0602 Shortness of breath: Secondary | ICD-10-CM | POA: Insufficient documentation

## 2023-11-08 DIAGNOSIS — R079 Chest pain, unspecified: Secondary | ICD-10-CM | POA: Insufficient documentation

## 2023-11-08 LAB — CBC
HCT: 35.6 % — ABNORMAL LOW (ref 36.0–46.0)
Hemoglobin: 12.6 g/dL (ref 12.0–15.0)
MCH: 31.2 pg (ref 26.0–34.0)
MCHC: 35.4 g/dL (ref 30.0–36.0)
MCV: 88.1 fL (ref 80.0–100.0)
Platelets: 237 10*3/uL (ref 150–400)
RBC: 4.04 MIL/uL (ref 3.87–5.11)
RDW: 12.9 % (ref 11.5–15.5)
WBC: 6.3 10*3/uL (ref 4.0–10.5)
nRBC: 0 % (ref 0.0–0.2)

## 2023-11-08 LAB — BASIC METABOLIC PANEL
Anion gap: 5 (ref 5–15)
BUN: 7 mg/dL (ref 6–20)
CO2: 28 mmol/L (ref 22–32)
Calcium: 8.8 mg/dL — ABNORMAL LOW (ref 8.9–10.3)
Chloride: 100 mmol/L (ref 98–111)
Creatinine, Ser: 0.76 mg/dL (ref 0.44–1.00)
GFR, Estimated: >60 mL/min (ref >60)
Glucose, Bld: 70 mg/dL (ref 70–99)
Potassium: 3.4 mmol/L — ABNORMAL LOW (ref 3.5–5.1)
Sodium: 133 mmol/L — ABNORMAL LOW (ref 135–145)

## 2023-11-08 LAB — TROPONIN I (HIGH SENSITIVITY)
Troponin I (High Sensitivity): <2 ng/L (ref <18)
Troponin I (High Sensitivity): <2 ng/L (ref <18)

## 2023-11-08 LAB — RESP PANEL BY RT-PCR (RSV, FLU A&B, COVID)  RVPGX2
Influenza A by PCR: NEGATIVE
Influenza B by PCR: NEGATIVE
Resp Syncytial Virus by PCR: NEGATIVE
SARS Coronavirus 2 by RT PCR: NEGATIVE

## 2023-11-08 LAB — D-DIMER, QUANTITATIVE: D-Dimer, Quant: 0.97 ug{FEU}/mL — ABNORMAL HIGH (ref 0.00–0.50)

## 2023-11-08 MED ORDER — ACETAMINOPHEN 325 MG PO TABS
650.0000 mg | ORAL_TABLET | Freq: Four times a day (QID) | ORAL | Status: DC | PRN
  Administered 2023-11-08: 650 mg via ORAL
  Filled 2023-11-08: qty 2

## 2023-11-08 MED ORDER — KETOROLAC TROMETHAMINE 15 MG/ML IJ SOLN
15.0000 mg | Freq: Once | INTRAMUSCULAR | Status: AC
  Administered 2023-11-08: 15 mg via INTRAVENOUS
  Filled 2023-11-08: qty 1

## 2023-11-08 MED ORDER — IOHEXOL 350 MG/ML SOLN
100.0000 mL | Freq: Once | INTRAVENOUS | Status: AC | PRN
  Administered 2023-11-08: 75 mL via INTRAVENOUS

## 2023-11-08 MED ORDER — DIPHENHYDRAMINE HCL 50 MG/ML IJ SOLN
25.0000 mg | Freq: Once | INTRAMUSCULAR | Status: AC
  Administered 2023-11-08: 25 mg via INTRAVENOUS
  Filled 2023-11-08: qty 1

## 2023-11-08 MED ORDER — METOCLOPRAMIDE HCL 5 MG/ML IJ SOLN
10.0000 mg | Freq: Once | INTRAMUSCULAR | Status: AC
  Administered 2023-11-08: 10 mg via INTRAVENOUS
  Filled 2023-11-08: qty 2

## 2023-11-08 NOTE — ED Notes (Addendum)
 Meghan Reed

## 2023-11-08 NOTE — ED Triage Notes (Signed)
 Headache since yesterday,  Tired nausea "I just feel awful"

## 2023-11-08 NOTE — ED Notes (Signed)
 Patient transported to CT

## 2023-11-08 NOTE — ED Provider Notes (Signed)
 [facility] Provider Note   CSN: [id number] Arrival date & time: 11/08/23  1331     History  Chief Complaint  Patient presents with   Headache   Chest Pain    Meghan Reed is a 38 y.o. female with past medical history of migraines, pulmonary embolism, asthma presenting to emergency room with complaint of headache that is been ongoing for 1 day and chest pain that has been ongoing for several hours, associated with dry cough and shortness of breath. History of PE, not on blood thinners, has had some left calf cramping the past few days - concerned she has blood clot again. Reports feeling tired, but she is unsure if this is due to migraine. Denies focal weakness, blurry or changes in vision, NVD, abdominal pain.    Headache Chest Pain Associated symptoms: headache        Home Medications Prior to Admission medications   Medication Sig Start Date End Date Taking? Authorizing Provider  albuterol  (PROVENTIL ) (2.5 MG/3ML) 0.083% nebulizer solution Take 3 mLs (2.5 mg total) by nebulization every 6 (six) hours as needed for wheezing or shortness of breath. 10/15/21   Geofm Glade PARAS, MD  albuterol  (VENTOLIN  HFA) 108 (90 Base) MCG/ACT inhaler Inhale 2 puffs into the lungs every 4 (four) hours as needed for wheezing or shortness of breath. 12/12/22   Gladis Leonor HERO, MD  budesonide -formoterol  (SYMBICORT ) 160-4.5 MCG/ACT inhaler Inhale 2 puffs into the lungs 2 (two) times daily. 12/12/22   Gladis Leonor HERO, MD  cyclobenzaprine  (FLEXERIL ) 10 MG tablet Take 1 tablet (10 mg total) by mouth every 8 (eight) hours as needed for muscle spasms. 07/04/23   Marlee Lynwood NOVAK, MD  desloratadine  (CLARINEX ) 5 MG tablet Take 1 tablet (5 mg total) by mouth daily. 06/15/23   Soldatova, Liuba, MD  ferrous sulfate  325 (65 FE) MG tablet Take 1 tablet (325 mg total) by mouth daily with breakfast. Please take with a source of Vitamin C 02/16/23   Dorsey,  John T IV, MD  fluticasone  (FLONASE ) 50 MCG/ACT nasal spray Place 2 sprays into both nostrils daily. 06/15/23   Soldatova, Liuba, MD  Galcanezumab -gnlm (EMGALITY ) 120 MG/ML SOAJ Inject 120 mg into the skin every 30 days. 05/23/23   Lomax, Amy, NP  HYDROcodone  bit-homatropine (HYCODAN) 5-1.5 MG/5ML syrup Take 5 mLs by mouth every 6 (six) hours as needed for cough. 09/06/23   Plotnikov, Karlynn GAILS, MD  loratadine  (CLARITIN ) 10 MG tablet Take 1 tablet (10 mg total) by mouth daily. 06/20/23   Hedges, Reyes, PA-C  omeprazole  (PRILOSEC  OTC) 20 MG tablet Take 1 tablet (20 mg total) by mouth daily. Patient taking differently: Take 20 mg by mouth daily as needed (acid reflux). 02/25/22   Autry-Lott, Rojean, DO  ondansetron  (ZOFRAN ) 4 MG tablet Take 1 tablet (4 mg total) by mouth every 8 (eight) hours as needed for nausea or vomiting. Patient not taking: Reported on 09/11/2023 11/22/22   Geofm Glade PARAS, MD  promethazine  (PHENERGAN ) 25 MG tablet Take 1 tablet (25 mg total) by mouth every 6 (six) hours as needed for up to 10 doses for nausea or vomiting. Patient not taking: Reported on 09/11/2023 03/22/23   Soto, Johana, PA-C  pseudoephedrine  (SUDAFED) 120 MG 12 hr tablet Take 1 tablet (120 mg total) by mouth 2 (two) times daily as needed for congestion. 11/29/22 11/29/23  Plotnikov, Karlynn GAILS, MD  Ubrogepant  (UBRELVY ) 100 MG TABS Take 1 tablet (100 mg) by mouth daily as  needed. 05/23/23   Lomax, Amy, NP      Allergies    Tomato, Aspirin , and Nsaids    Review of Systems   Review of Systems  Cardiovascular:  Positive for chest pain.  Neurological:  Positive for headaches.    Physical Exam Updated Vital Signs BP 110/84   Pulse 77   Temp 98 F (36.7 C)   Resp 20   LMP  (LMP Unknown)   SpO2 100%  Physical Exam Vitals and nursing note reviewed.  Constitutional:      General: She is not in acute distress.    Appearance: She is not toxic-appearing.  HENT:     Head: Normocephalic and atraumatic.  Eyes:      General: No scleral icterus.    Conjunctiva/sclera: Conjunctivae normal.  Cardiovascular:     Rate and Rhythm: Normal rate and regular rhythm.     Pulses: Normal pulses.     Heart sounds: Normal heart sounds.  Pulmonary:     Effort: Pulmonary effort is normal. No respiratory distress.     Breath sounds: Normal breath sounds. No stridor. No wheezing, rhonchi or rales.  Abdominal:     General: Abdomen is flat. Bowel sounds are normal.     Palpations: Abdomen is soft.     Tenderness: There is no abdominal tenderness.  Musculoskeletal:     Right lower leg: No edema.     Left lower leg: No edema.     Comments: Left calf soreness, no swelling or discoloration  Skin:    General: Skin is warm and dry.     Findings: No lesion.  Neurological:     General: No focal deficit present.     Mental Status: She is alert and oriented to person, place, and time. Mental status is at baseline.     Comments: Patient is alert oriented answering questions appropriately.  No nystagmus, pupils equal and reactive.  No ataxia.  Upper extremity and lower extremity with no sensory deficits strength is equal bilaterally.      ED Results / Procedures / Treatments   Labs (all labs ordered are listed, but only abnormal results are displayed) Labs Reviewed  BASIC METABOLIC PANEL - Abnormal; Notable for the following components:      Result Value   Sodium 133 (*)    Potassium 3.4 (*)    Calcium  8.8 (*)    All other components within normal limits  CBC - Abnormal; Notable for the following components:   HCT 35.6 (*)    All other components within normal limits  D-DIMER, QUANTITATIVE - Abnormal; Notable for the following components:   D-Dimer, Quant 0.97 (*)    All other components within normal limits  RESP PANEL BY RT-PCR (RSV, FLU A&B, COVID)  [id number]  TROPONIN I (HIGH SENSITIVITY)  TROPONIN I (HIGH SENSITIVITY)    EKG None  Radiology CT Angio Chest PE W and/or Wo Contrast Result Date:  11/08/2023 CLINICAL DATA:  Nausea and chest pain EXAM: CT ANGIOGRAPHY CHEST WITH CONTRAST TECHNIQUE: Multidetector CT imaging of the chest was performed using the standard protocol during bolus administration of intravenous contrast. Multiplanar CT image reconstructions and MIPs were obtained to evaluate the vascular anatomy. RADIATION DOSE REDUCTION: This exam was performed according to the departmental dose-optimization program which includes automated exposure control, adjustment of the mA and/or kV according to patient size and/or use of iterative reconstruction technique. CONTRAST:  75mL OMNIPAQUE  IOHEXOL  350 MG/ML SOLN COMPARISON:  Chest x-ray from earlier in the  same day. FINDINGS: Cardiovascular: Thoracic aorta shows a normal branching pattern. No aneurysmal dilatation or dissection is seen. The pulmonary artery shows a normal branching pattern bilaterally. No filling defect to suggest pulmonary embolism is noted. Mediastinum/Nodes: Thoracic inlet is within normal limits. No hilar or mediastinal adenopathy is noted. The esophagus as visualized is within normal limits. Lungs/Pleura: Lungs are well aerated bilaterally. No focal infiltrate or effusion is seen. Upper Abdomen: Visualized upper abdomen is within normal limits. Musculoskeletal: No chest wall abnormality. No acute or significant osseous findings. Review of the MIP images confirms the above findings. IMPRESSION: No evidence of pulmonary emboli. No other focal abnormality is noted. Electronically Signed   By: Oneil Devonshire M.D.   On: 11/08/2023 20:00   DG Chest 2 View Result Date: 11/08/2023 CLINICAL DATA:  Chest pain and shortness of breath for 2 days. EXAM: CHEST - 2 VIEW COMPARISON:  09/22/2022 FINDINGS: The heart size and mediastinal contours are within normal limits. Both lungs are clear. The visualized skeletal structures are unremarkable. IMPRESSION: Normal exam. Electronically Signed   By: Norleen DELENA Kil M.D.   On: 11/08/2023 15:24     Procedures Procedures    Medications Ordered in ED Medications  acetaminophen  (TYLENOL ) tablet 650 mg (650 mg Oral Given 11/08/23 2003)  metoCLOPramide  (REGLAN ) injection 10 mg (10 mg Intravenous Given 11/08/23 1753)  diphenhydrAMINE  (BENADRYL ) injection 25 mg (25 mg Intravenous Given 11/08/23 1752)  iohexol  (OMNIPAQUE ) 350 MG/ML injection 100 mL (75 mLs Intravenous Contrast Given 11/08/23 1840)  ketorolac  (TORADOL ) 15 MG/ML injection 15 mg (15 mg Intravenous Given 11/08/23 2020)    ED Course/ Medical Decision Making/ A&P                                 Medical Decision Making Amount and/or Complexity of Data Reviewed Labs: ordered. Radiology: ordered.  Risk OTC drugs. Prescription drug management.   [name] 38 y.o. presented today for chest pain. Working DDx that I considered at this time includes, but not limited to, ACS, GERD, pe, pna, aortic dissection, pneumothorax, MSK path, anemia, esophageal rupture, CHF exacerbation, valvular disorder, myocarditis, pericarditis, endocarditis, pericardial effusion/cardiac tamponade, pulmonary edema, gastritis/PUD, esophagitis.  R/o Dx: Aortic Dissection: less likely based on the history of present illness - location, quality, onset, and severity of symptoms in this case.   Review of prior external notes: 10/22/23  Unique Tests and My Interpretation:  EKG: Rate, rhythm, axis, intervals all examined: sinus with nonspecific st changes   Troponin: <2, obtaining repeat Dimmer elevated, ordering CTA  CXR: no abnormality  CBC: no leukocytosis, no anemia  BMP: no electrolyte abnormality Resp panel: negative    Chest x-ray no acute abnormality.  CT with no pulmonary embolism.  Problem List / ED Course / Critical interventions / Medication management  Patient reporting with headache that has been ongoing for multiple days.  Has tried at home medications.  Feels like typical migraine.  Reassuring physical exam.  Symptoms  improved after migraine cocktail.  Patient also reporting 1 day of chest pain.  Patient elevated D-dimer discussed CTA to rule out pulmonary embolism.  Chest x-ray without abnormality.  2 negative troponins.  Patient feeling better requesting discharge. Thermodynamically stable, ready for discharge.  Will follow-up with neurology due to persistent headaches.  Patient will follow-up with primary care in regards to chest pain.  Patient does follow annually with cardiology reports upcoming visit with them as well. I  ordered medication including migraine cocktail  Reevaluation of the patient after these medicines showed that the patient improved Patients vitals assessed. Upon arrival patient is hemodynamically stable.  I have reviewed the patients home medicines and have made adjustments as needed    Plan:  F/u w/ PCP to ensure resolution of sx.  Patient was given return precautions. Patient stable for discharge at this time.  Patient educated on sx/ dx and verbalized understanding of plan.  Will return to ER w/ new or worsening sx.          Final Clinical Impression(s) / ED Diagnoses Final diagnoses:  Bad headache  Chest pain, unspecified type    Rx / DC Orders ED Discharge Orders     None         Shermon Warren SAILOR, PA-C 11/08/23 2024    Jerral Meth, MD 11/08/23 2310

## 2023-11-08 NOTE — Discharge Instructions (Signed)
 You were seen emergency room today for chest pain.  Imaging and lab work is reassuring.  Please follow-up with cardiology as discussed.  Since you continue to have daily, persistent headache please follow up with neurology to discuss options going forward. Return with new or worsening symptoms.

## 2023-11-16 ENCOUNTER — Ambulatory Visit: Payer: Self-pay | Admitting: Neurology

## 2023-11-16 ENCOUNTER — Telehealth: Payer: Self-pay | Admitting: *Deleted

## 2023-11-16 ENCOUNTER — Ambulatory Visit: Payer: Medicaid Other | Admitting: Neurology

## 2023-11-16 ENCOUNTER — Encounter: Payer: Self-pay | Admitting: *Deleted

## 2023-11-16 DIAGNOSIS — G43709 Chronic migraine without aura, not intractable, without status migrainosus: Secondary | ICD-10-CM

## 2023-11-16 DIAGNOSIS — M542 Cervicalgia: Secondary | ICD-10-CM

## 2023-11-16 DIAGNOSIS — R200 Anesthesia of skin: Secondary | ICD-10-CM | POA: Diagnosis not present

## 2023-11-16 DIAGNOSIS — M7918 Myalgia, other site: Secondary | ICD-10-CM

## 2023-11-16 DIAGNOSIS — R202 Paresthesia of skin: Secondary | ICD-10-CM | POA: Diagnosis not present

## 2023-11-16 DIAGNOSIS — Z0289 Encounter for other administrative examinations: Secondary | ICD-10-CM

## 2023-11-16 DIAGNOSIS — M79601 Pain in right arm: Secondary | ICD-10-CM

## 2023-11-16 NOTE — Telephone Encounter (Signed)
Chronic Migraine CPT 64615  Botox [id number] Units:200  G43.709 Chronic Migraine without aura, not intractable, without status migrainous   

## 2023-11-16 NOTE — Progress Notes (Signed)
Full Name: Meghan Reed Gender: Female MRN #: [id number] Date of Birth: 1986-01-01    Visit Date: 11/16/2023 07:31 Age: 38 Years Examining Physician: Dr. [name] Referring Physician: Drake Leach, PA-C Height: 5 feet 6 inch  History: Chronic neck pain and paresthesias in the arms  Summary: EMG/NCS was performed on the bilateral upper extremities. All nerves and muscles (as indicated in the following tables) were within normal limits.       Conclusion: This is a normal study of the bilateral upper extremities. No electrophysiologic evidence for mononeuropathy, polyneuropathy or cervical radiculopathy     ------------------------------- [name], M.D.  [facility] [address], [address] [place], [place] [address] Tel: [phone] Fax: [phone]  Verbal informed consent was obtained from the patient, patient was informed of potential risk of procedure, including bruising, bleeding, hematoma formation, infection, muscle weakness, muscle pain, numbness, among others.        MNC    Nerve / Sites Muscle Latency Ref. Amplitude Ref. Rel Amp Segments Distance Velocity Ref. Area    ms ms mV mV %  cm m/s m/s mVms  L Median - APB     Wrist APB 4.0 <=4.4 7.4 >=4.0 100 Wrist - APB 7   31.4     Upper arm APB 8.0  6.6  88.5 Upper arm - Wrist 23 57 >=49 30.7  R Median - APB     Wrist APB 3.9 <=4.4 9.9 >=4.0 100 Wrist - APB 7   41.0     Upper arm APB 8.0  9.5  95.4 Upper arm - Wrist 24 58 >=49 39.2  L Ulnar - ADM     Wrist ADM 2.8 <=3.3 9.9 >=6.0 100 Wrist - ADM 7   38.8     B.Elbow ADM 4.6  9.0  91.2 B.Elbow - Wrist 11 60 >=49 36.8     A.Elbow ADM 8.1  8.9  98.3 A.Elbow - B.Elbow 21 60 >=49 36.8  R Ulnar - ADM     Wrist ADM 3.3 <=3.3 11.1 >=6.0 100 Wrist - ADM 7   34.5     B.Elbow ADM 5.3  8.7  77.7 B.Elbow - Wrist 12.6 62 >=49 27.3     A.Elbow ADM 8.3  12.3  142 A.Elbow - B.Elbow 17 57 >=49 36.2             SNC    Nerve / Sites Rec.  Site Peak Lat Ref.  Amp Ref. Segments Distance Peak Diff Ref.    ms ms V V  cm ms ms  L Median, Ulnar - Transcarpal comparison     Median Palm Wrist 2.2 <=2.2 58 >=35 Median Palm - Wrist 8       Ulnar Palm Wrist 2.0 <=2.2 33 >=12 Ulnar Palm - Wrist 8          Median Palm - Ulnar Palm  0.1 <=0.4  R Median, Ulnar - Transcarpal comparison     Median Palm Wrist 2.1 <=2.2 62 >=35 Median Palm - Wrist 8       Ulnar Palm Wrist 1.8 <=2.2 17 >=12 Ulnar Palm - Wrist 8          Median Palm - Ulnar Palm  0.3 <=0.4  L Median - Orthodromic (Dig II, Mid palm)     Dig II Wrist 3.4 <=3.4 34 >=10 Dig II - Wrist 13    R Median - Orthodromic (Dig II, Mid palm)  Dig II Wrist 3.2 <=3.4 26 >=10 Dig II - Wrist 13    L Ulnar - Orthodromic, (Dig V, Mid palm)     Dig V Wrist 3.1 <=3.1 14 >=5 Dig V - Wrist 11    R Ulnar - Orthodromic, (Dig V, Mid palm)     Dig V Wrist 2.9 <=3.1 12 >=5 Dig V - Wrist 84                   F  Wave    Nerve F Lat Ref.   ms ms  L Ulnar - ADM 26.8 <=32.0  R Ulnar - ADM 27.7 <=32.0         EMG Summary Table    Spontaneous MUAP Recruitment  Muscle IA Fib PSW Fasc Other Amp Dur. Poly Pattern  Bilateral Cervical paraspinals (low) Normal None None None _______ Normal Normal Normal Normal  Bilateral  Deltoid Normal None None None _______ Normal Normal Normal Normal  Bilateral Triceps brachii Normal None None None _______ Normal Normal Normal Normal  Bilateral  Pronator teres Normal None None None _______ Normal Normal Normal Normal  Bilateral  Opponens pollicis Normal None None None _______ Normal Normal Normal Normal  Bilateral  First dorsal interosseous Normal None None None _______ Normal Normal Normal Normal  Bilateral  Extensor digitorum communis Normal None None None _______ Normal Normal Normal Normal  Bilateral   Extensor indicis proprius Normal None None None _______ Normal Normal Normal Normal

## 2023-11-16 NOTE — Progress Notes (Signed)
See procedure note.

## 2023-11-16 NOTE — Progress Notes (Signed)
Error see other notes

## 2023-11-16 NOTE — Telephone Encounter (Signed)
-----   Message from Anson Fret sent at 11/16/2023 11:32 AM EST ----- Regarding: Please start botox protocol Please start botox protocol [id number]. She can be placed on Amy's schedule. If Amy is booking out for botox I can add her in and she can transition to Amy for her second (Amy see my notes from today) thanks!

## 2023-11-16 NOTE — Progress Notes (Signed)
Patient reports migraines are worsening. Was doing well on Emgality for 2 years but last 5 month her migraines have worsened. Migraines can last 3-4 days. She has daily headaches. She has 12 moderate to severe migraine days a month with pulsating/pounding/throbbing, dizziness, nausea, photo/phono/osmophobia, worse with looking at computer, she just had an eye exam, hurts to move, no medication overuse, I suggest botox for migraines. Aimovig is contraindicated due to constipation. She has been on Emgality for 2 years and worked well in the past but now not working. No aura. Will start botox protocol. She has very tight cervical muscles, likely contributing/causing her neck pain since emg/ncs normal. Likely symptoms due to cervical myofascial pain syndrome, posture and this can also worsen migraines.  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   Discussed with patient:  - EMG/NCS normal and MRI cervical spine without significant foraminal stenosis so Likely symptoms due to cervical myofascial pain syndrome, posture and this can also worsen migraines.  - Start Botox for migraine protocol. Then if needed can layer Ajovy or Vyepti. Send message to team to start the approval process. - Physical Therapy: Physical Therapy: Cervical myofascial pain, forward posture contributing to migraines and cervicalgia, chronic muscular neck pain. Please evaluate and treat including DRY NEEDLING, stretching, strengthening, manual therapy/massage, heating, TENS unit, exercising for scapular stabilization, pectoral stretching and rhomboid strengthening as clinically warranted as well as any other modality as recommended by evaluation.Discussed sites, will send to [facility] (closest to patient's home) - Wrote an [employer] manager today with patient for a sit-stand desk and other suggestions that may  help her with posture and neck pain and migraines. We can also complete FMLA.  - composed a letter for patient: [name] is a patient in my neurology clinic. This 38 year old female reports pain in her upper and lower back and bilateral upper and lower extremities when sitting at his/her desk for long periods at work contributing to low back pain, neck pain, myofascial pain, numbness and tingling in the extremities and other chronic pain conditions and can also contribute to worsening migraines; this can significantly affcet her ability to perform certain job functions at times. These symptoms can be lessened, or prevented, with the use of an adjustable standing desk. Studies have shown there are benefits to alternation standing and adjusting positions while working, as opposed to sitting alone. Other accommodations include permission to take short breaks throughout the day to minimize screen exposure, which can trigger migraines. Also allowing her to stand at her desk with an ergonomic standing mat as well as an ergonomic chair with lower back support will also help. Please consider these recommendations.   Reviewed MRI cervical spine:  CLINICAL DATA:  Chronic neck pain.   EXAM: MRI CERVICAL SPINE WITHOUT CONTRAST   TECHNIQUE: Multiplanar, multisequence MR imaging of the cervical spine was performed. No intravenous contrast was administered.   COMPARISON:  MRI of the cervical spine 08/31/2021.   FINDINGS: Alignment: No significant listhesis is present. Mild straightening of the normal cervical lordosis is stable.   Vertebrae: Marrow signal and vertebral body heights are normal.   Cord: Normal signal and morphology.   Posterior Fossa, vertebral arteries, paraspinal tissues: Craniocervical junction is normal. Flow is present in the vertebral arteries bilaterally. Visualized intracranial contents are normal. Mild left maxillary sinus disease is noted.   Disc levels:  C2-3:  Negative.   C3-4: Negative.   C4-5: Negative.   C5-6: A leftward disc protrusion and annular tear is present. Mild left foraminal narrowing is stable.   C6-7: Asymmetric left-sided uncovertebral spurring leads to mild left foraminal narrowing, stable.   C7-T1: Mild facet hypertrophy is present bilaterally. No focal disc protrusion or stenosis is present.   IMPRESSION: 1. Stable mild left foraminal narrowing at C5-6 and C6-7. 2. Mild facet hypertrophy bilaterally at C7-T1 without focal disc protrusion or stenosis.   Physical exam: Exam: Gen: NAD, conversant      CV: No palpitations or chest pain or SOB. VS: Breathing at a normal rate. Weight appears overweight. Not febrile. Eyes: Conjunctivae clear without exudates or hemorrhage Neck: Very tight cervical and lumbar muscles with pain on palpation  Neuro: Detailed Neurologic Exam  Speech:    Speech is normal; fluent and spontaneous with normal comprehension.  Cognition:    The patient is oriented to person, place, and time;     recent and remote memory intact;     language fluent;     normal attention, concentration, fund of knowledge Cranial Nerves:    The pupils are equal, round, and reactive to light. Visual fields are full Extraocular movements are intact.  The face is symmetric with normal sensation. The palate elevates in the midline. Hearing intact. Voice is normal. Shoulder shrug is normal. The tongue has normal motion without fasciculations.   Coordination: normal  Gait:    No abnormalities noted or reported  Motor Observation:   no involuntary movements noted. Tone:    Appears normal  Posture:    Posture is normal. normal erect    Strength:    Strength is anti-gravity and symmetric in the upper and lower limbs.      Sensation: intact to LT, no reports of numbness or tingling or paresthesias     I spent over 45 minutes of face-to-face and non-face-to-face time with patient on the  1. Numbness and  tingling in both hands   2. Neck pain   3. Chronic migraine without aura without status migrainosus, not intractable   4. Cervical myofascial pain syndrome    diagnosis.  This included previsit chart review, lab review, study review, order entry, electronic health record documentation, patient education on the different diagnostic and therapeutic options, counseling and coordination of care, risks and benefits of management, compliance, or risk factor reduction. This does not include time spent on emg/ncs procedure.

## 2023-11-16 NOTE — Telephone Encounter (Signed)
-----   Message from Anson Fret sent at 11/16/2023  9:04 AM EST ----- Regarding: Please put thi sin a letter for me Pleae write accomodation letter for the following:  Meghan Reed is a patient in my neurology clinic. This 38 year old female reports pain in her upper and lower back and bilateral upper and lower extremities when sitting at his/her desk for long periods at work contributing to low back pain, neck pain, myofascial pain, numbness and tingling in the extremities and other chronic pain conditions and can also contribute to worsening migraines; this can significantly affcet her ability to perform certain job functions at times. These symptoms can be lessened, or prevented, with the use of an adjustable standing desk. Studies have shown there are benefits to alternation standing and adjusting positions while working, as opposed to sitting alone. Other accommodations include permission to take short breaks throughout the day to minimize screen exposure, which can trigger migraines. Also allowing her to stand at her desk with an ergonomic standing mat as well as an ergonomic chair with lower back support will also help. Please consider these recommendations.

## 2023-11-16 NOTE — Telephone Encounter (Signed)
Spoke to pt and she will pick up letter tomorrow by 1200.  Placed up front in file.

## 2023-11-16 NOTE — Telephone Encounter (Signed)
Letter composed and printed for signature for pt.
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
## 2023-11-20 ENCOUNTER — Telehealth: Payer: Self-pay | Admitting: Neurology

## 2023-11-20 ENCOUNTER — Other Ambulatory Visit: Payer: Self-pay

## 2023-11-20 ENCOUNTER — Ambulatory Visit: Payer: Medicaid Other | Attending: Neurology | Admitting: Physical Therapy

## 2023-11-20 ENCOUNTER — Encounter: Payer: Self-pay | Admitting: Physical Therapy

## 2023-11-20 DIAGNOSIS — M6281 Muscle weakness (generalized): Secondary | ICD-10-CM | POA: Insufficient documentation

## 2023-11-20 DIAGNOSIS — R293 Abnormal posture: Secondary | ICD-10-CM | POA: Insufficient documentation

## 2023-11-20 DIAGNOSIS — R42 Dizziness and giddiness: Secondary | ICD-10-CM | POA: Diagnosis present

## 2023-11-20 DIAGNOSIS — M7918 Myalgia, other site: Secondary | ICD-10-CM | POA: Insufficient documentation

## 2023-11-20 DIAGNOSIS — G43709 Chronic migraine without aura, not intractable, without status migrainosus: Secondary | ICD-10-CM | POA: Insufficient documentation

## 2023-11-20 DIAGNOSIS — M542 Cervicalgia: Secondary | ICD-10-CM | POA: Insufficient documentation

## 2023-11-20 NOTE — Telephone Encounter (Signed)
Pt called to verify an appointment for today scheduled with Dr.Penumalli. Informed patient she did not have an scheduled withDr.  Marjory Lies. Patient stated received a text and email had an appointment.

## 2023-11-21 ENCOUNTER — Encounter: Payer: Self-pay | Admitting: Neurology

## 2023-11-21 NOTE — Telephone Encounter (Signed)
Submitted benefit verification, will update once results are received. [id number]

## 2023-11-22 ENCOUNTER — Encounter: Payer: Self-pay | Admitting: *Deleted

## 2023-11-23 NOTE — Progress Notes (Signed)
Telephone Visit- Progress Note: Referring Physician:  Alicia Amel, MD [address] [place],  [place] [address]  Primary Physician:  Alicia Amel, MD  This visit was performed via telephone.  Patient location: home Provider location: office  I spent a total of 12 minutes non-face-to-face activities for this visit on the date of this encounter including review of current clinical condition and response to treatment.    Patient has given verbal consent to this telephone visits and we reviewed the limitations of a telephone visit. Patient wishes to proceed.    Chief Complaint:  follow up  History of Present Illness: Meghan Reed is a 38 y.o. female has a history of migraines, asthma, PTSD.    History of chronic neck pain x years.   Last seen by me on 07/27/23 for neck and bilateral arm pain. She has known cervical spondylosis with mild left foraminal stenosis C5-C6 and C6-C7. A lot of her pain appeared more myofascial in nature.   EMG in 2022 was normal. I felt some of her pain was shoulder mediated.   She was sent to ortho for her shoulder. Phone visit to review her EMG.   She saw Dr. Steward Drone and she was sent to PT for bilateral scapular dyskinesis She has follow up with him today. She did not go to PT for the shoulder.   Her neurologist set her up with PT for her cervical spine as well. She had initial eval on 11/20/23.   She continues with constant neck pain with intermittent left arm pain to her elbow and intermittent right arm to her hand. Right arm pain in worse since last visit. She continues with numbness and tingling in right > left arm. Neck pain > arm pain, right arm pain > left arm pain. She has weakness in left > right arm.   She is taking flexeril prn.     She does not smoke.    Conservative measures:  Physical therapy: did 9 visits of PT in 2022 for her neck. No relief with dry needling Multimodal medical therapy including regular  antiinflammatories: flexeril, neurontin, soma, oxycodone, mobic, lyrica Injections:  Injections with Dr. Jordan Likes (PMR)- had great relief with first, not much with last 3 (August 2023)   Past Surgery: no cervical surgery   [name] has no symptoms of cervical myelopathy. She has some dexterity issues.     Exam: No exam done as this was a telephone encounter.     Imaging: EMG of bilateral upper extremities dated 11/16/23:  Conclusion: This is a normal study of the bilateral upper extremities. No electrophysiologic evidence for mononeuropathy, polyneuropathy or cervical radiculopathy   ------------------------------- [name], M.D.   Assessment and Plan: Ms. Corvin is a pleasant 38 y.o. female who has a history of chronic neck pain x years.    She continues with constant neck pain with intermittent left arm pain to her elbow and intermittent right arm to her hand. Right arm pain in worse since last visit. She continues with numbness and tingling in right > left arm. Neck pain > arm pain, right arm pain > left arm pain. She has weakness in left > right arm.    She has known cervical spondylosis with mild left foraminal stenosis C5-C6 and C6-C7. No instability noted on flex/ext xrays.    Recent EMG of bilateral upper extremities on 11/16/23 was normal.   A lot of her pain is likely myofascial and she also has component  of left shoulder pain as well.    Treatment options discussed with patient and following plan made:  - Agree with referral to PT for cervical spine by neurology.  - She has follow up with ortho today for her shoulders. Hasn't done PT for them yet.  - Discussed revisiting cervical injections. She declines for now and wants to see how PT goes.  - Follow up with me in 6-8 weeks for repeat evaluation.   Drake Leach PA-C Neurosurgery

## 2023-11-24 ENCOUNTER — Encounter: Payer: Self-pay | Admitting: Orthopedic Surgery

## 2023-11-24 ENCOUNTER — Ambulatory Visit (INDEPENDENT_AMBULATORY_CARE_PROVIDER_SITE_OTHER): Payer: Medicaid Other | Admitting: Orthopedic Surgery

## 2023-11-24 ENCOUNTER — Ambulatory Visit (HOSPITAL_BASED_OUTPATIENT_CLINIC_OR_DEPARTMENT_OTHER): Payer: Medicaid Other | Admitting: Orthopaedic Surgery

## 2023-11-24 DIAGNOSIS — R200 Anesthesia of skin: Secondary | ICD-10-CM | POA: Diagnosis not present

## 2023-11-24 DIAGNOSIS — G8929 Other chronic pain: Secondary | ICD-10-CM

## 2023-11-24 DIAGNOSIS — M25511 Pain in right shoulder: Secondary | ICD-10-CM

## 2023-11-24 DIAGNOSIS — M4722 Other spondylosis with radiculopathy, cervical region: Secondary | ICD-10-CM

## 2023-11-24 DIAGNOSIS — R202 Paresthesia of skin: Secondary | ICD-10-CM | POA: Diagnosis not present

## 2023-11-24 DIAGNOSIS — M47812 Spondylosis without myelopathy or radiculopathy, cervical region: Secondary | ICD-10-CM

## 2023-11-24 DIAGNOSIS — M5412 Radiculopathy, cervical region: Secondary | ICD-10-CM
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
## 2023-11-28 ENCOUNTER — Ambulatory Visit: Payer: Medicaid Other

## 2023-11-28 DIAGNOSIS — M6281 Muscle weakness (generalized): Secondary | ICD-10-CM

## 2023-11-28 DIAGNOSIS — M542 Cervicalgia: Secondary | ICD-10-CM | POA: Diagnosis not present

## 2023-11-28 DIAGNOSIS — R293 Abnormal posture: Secondary | ICD-10-CM

## 2023-11-28 DIAGNOSIS — Z0289 Encounter for other administrative examinations: Secondary | ICD-10-CM

## 2023-11-28 DIAGNOSIS — R42 Dizziness and giddiness: Secondary | ICD-10-CM
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
## 2023-11-29 NOTE — Telephone Encounter (Signed)
Faxed [employer] PA form along with OV notes to [phone].

## 2023-11-30 ENCOUNTER — Ambulatory Visit: Payer: Medicaid Other

## 2023-11-30 ENCOUNTER — Telehealth: Payer: Self-pay | Admitting: *Deleted

## 2023-11-30 NOTE — Telephone Encounter (Signed)
Pt matrix form faxed on 11/30/2023
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
## 2023-11-30 NOTE — Telephone Encounter (Signed)
Late entry from 11/29/23. Phone room stated pt called in and said those dates she gave were from when she was out with a migraine.  Fmla form completed, signed, and sent to medical records for processing.

## 2023-12-01 ENCOUNTER — Encounter (HOSPITAL_BASED_OUTPATIENT_CLINIC_OR_DEPARTMENT_OTHER): Payer: Self-pay | Admitting: Orthopaedic Surgery

## 2023-12-04 ENCOUNTER — Encounter: Payer: Self-pay | Admitting: Physical Therapy

## 2023-12-04 ENCOUNTER — Ambulatory Visit: Payer: Medicaid Other | Attending: Neurology | Admitting: Physical Therapy

## 2023-12-04 DIAGNOSIS — M542 Cervicalgia: Secondary | ICD-10-CM | POA: Diagnosis present

## 2023-12-04 DIAGNOSIS — R293 Abnormal posture: Secondary | ICD-10-CM | POA: Insufficient documentation

## 2023-12-04 DIAGNOSIS — R42 Dizziness and giddiness: Secondary | ICD-10-CM | POA: Diagnosis present

## 2023-12-04 DIAGNOSIS — M6281 Muscle weakness (generalized): Secondary | ICD-10-CM | POA: Diagnosis present

## 2023-12-04 NOTE — Patient Instructions (Signed)

## 2023-12-05 NOTE — Progress Notes (Signed)
 12/05/23 ALL: Meghan Reed presents for first Botox  procedure. She continues Emgality  and Ubrelvy . She averages at least 10-15 migraine days a month. She gets some relief with Ubrelvy  and Advil .     Consent Form Botulism Toxin Injection For Chronic Migraine    Reviewed orally with patient, additionally signature is on file:  Botulism toxin has been approved by the Federal drug administration for treatment of chronic migraine. Botulism toxin does not cure chronic migraine and it may not be effective in some patients.  The administration of botulism toxin is accomplished by injecting a small amount of toxin into the muscles of the neck and head. Dosage must be titrated for each individual. Any benefits resulting from botulism toxin tend to wear off after 3 months with a repeat injection required if benefit is to be maintained. Injections are usually done every 3-4 months with maximum effect peak achieved by about 2 or 3 weeks. Botulism toxin is expensive and you should be sure of what costs you will incur resulting from the injection.  The side effects of botulism toxin use for chronic migraine may include:   -Transient, and usually mild, facial weakness with facial injections  -Transient, and usually mild, head or neck weakness with head/neck injections  -Reduction or loss of forehead facial animation due to forehead muscle weakness  -Eyelid drooping  -Dry eye  -Pain at the site of injection or bruising at the site of injection  -Double vision  -Potential unknown long term risks   Contraindications: You should not have Botox  if you are pregnant, nursing, allergic to albumin, have an infection, skin condition, or muscle weakness at the site of the injection, or have myasthenia gravis, Lambert-Eaton syndrome, or ALS.  It is also possible that as with any injection, there may be an allergic reaction or no effect from the medication. Reduced effectiveness after repeated injections is sometimes  seen and rarely infection at the injection site may occur. All care will be taken to prevent these side effects. If therapy is given over a long time, atrophy and wasting in the muscle injected may occur. Occasionally the patient's become refractory to treatment because they develop antibodies to the toxin. In this event, therapy needs to be modified.  I have read the above information and consent to the administration of botulism toxin.    BOTOX  PROCEDURE NOTE FOR MIGRAINE HEADACHE  Contraindications and precautions discussed with patient(above). Aseptic procedure was observed and patient tolerated procedure. Procedure performed by Greig Forbes, FNP-C.   The condition has existed for more than 6 months, and pt does not have a diagnosis of ALS, Myasthenia Gravis or Lambert-Eaton Syndrome.  Risks and benefits of injections discussed and pt agrees to proceed with the procedure.  Written consent obtained  These injections are medically necessary. Pt  receives good benefits from these injections. These injections do not cause sedations or hallucinations which the oral therapies may cause.   Description of procedure:  The patient was placed in a sitting position. The standard protocol was used for Botox  as follows, with 5 units of Botox  injected at each site:  -Procerus muscle, midline injection  -Corrugator muscle, bilateral injection  -Frontalis muscle, bilateral injection, with 2 sites each side, medial injection was performed in the upper one third of the frontalis muscle, in the region vertical from the medial inferior edge of the superior orbital rim. The lateral injection was again in the upper one third of the forehead vertically above the lateral limbus of the  cornea, 1.5 cm lateral to the medial injection site.  -Temporalis muscle injection, 4 sites, bilaterally. The first injection was 3 cm above the tragus of the ear, second injection site was 1.5 cm to 3 cm up from the first injection  site in line with the tragus of the ear. The third injection site was 1.5-3 cm forward between the first 2 injection sites. The fourth injection site was 1.5 cm posterior to the second injection site. 5th site laterally in the temporalis  muscleat the level of the outer canthus.  -Occipitalis muscle injection, 3 sites, bilaterally. The first injection was done one half way between the occipital protuberance and the tip of the mastoid process behind the ear. The second injection site was done lateral and superior to the first, 1 fingerbreadth from the first injection. The third injection site was 1 fingerbreadth superiorly and medially from the first injection site.  -Cervical paraspinal muscle injection, 2 sites, bilaterally. The first injection site was 1 cm from the midline of the cervical spine, 3 cm inferior to the lower border of the occipital protuberance. The second injection site was 1.5 cm superiorly and laterally to the first injection site.  -Trapezius muscle injection was performed at 3 sites, bilaterally. The first injection site was in the upper trapezius muscle halfway between the inflection point of the neck, and the acromion. The second injection site was one half way between the acromion and the first injection site. The third injection was done between the first injection site and the inflection point of the neck.   Will return for repeat injection in 3 months.   A total of 200 units of Botox  was prepared, 155 units of Botox  was injected as documented above, any Botox  not injected was wasted. The patient tolerated the procedure well, there were no complications of the above procedure.

## 2023-12-05 NOTE — Telephone Encounter (Signed)
Received fax from [facility] that Meghan Reed is not required for B/B.

## 2023-12-06 ENCOUNTER — Ambulatory Visit (INDEPENDENT_AMBULATORY_CARE_PROVIDER_SITE_OTHER): Payer: Medicaid Other | Admitting: Family Medicine

## 2023-12-06 ENCOUNTER — Encounter: Payer: Self-pay | Admitting: Family Medicine

## 2023-12-06 VITALS — BP 109/75 | HR 91

## 2023-12-06 DIAGNOSIS — G43709 Chronic migraine without aura, not intractable, without status migrainosus: Secondary | ICD-10-CM

## 2023-12-06 MED ORDER — ONABOTULINUMTOXINA 200 UNITS IJ SOLR
155.0000 [IU] | Freq: Once | INTRAMUSCULAR | Status: AC
  Administered 2023-12-06: 155 [IU] via INTRAMUSCULAR

## 2023-12-06 NOTE — Progress Notes (Signed)
 Botox - 200 units x 1 vial Lot: [id number] Expiration: 01/2026 NDC: 9323-5573-22   Bacteriostatic 0.9% Sodium Chloride - * mL  Lot: [id number] Expiration: 08/31/2024 NDC: 0623-7628-31   Dx: D17.616 B/B Witnessed by: Marcelline Sermons
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
## 2023-12-07 ENCOUNTER — Encounter: Payer: Self-pay | Admitting: Physical Therapy

## 2023-12-07 ENCOUNTER — Ambulatory Visit: Payer: Medicaid Other | Admitting: Physical Therapy

## 2023-12-07 DIAGNOSIS — M542 Cervicalgia: Secondary | ICD-10-CM | POA: Diagnosis not present

## 2023-12-07 DIAGNOSIS — R293 Abnormal posture: Secondary | ICD-10-CM

## 2023-12-07 DIAGNOSIS — M6281 Muscle weakness (generalized): Secondary | ICD-10-CM

## 2023-12-07 DIAGNOSIS — R42 Dizziness and giddiness: Secondary | ICD-10-CM

## 2023-12-09 ENCOUNTER — Other Ambulatory Visit: Payer: Medicaid Other

## 2023-12-11 ENCOUNTER — Encounter: Payer: Self-pay | Admitting: Physical Therapy

## 2023-12-11 ENCOUNTER — Ambulatory Visit: Payer: Medicaid Other | Admitting: Physical Therapy

## 2023-12-11 DIAGNOSIS — M6281 Muscle weakness (generalized): Secondary | ICD-10-CM

## 2023-12-11 DIAGNOSIS — R293 Abnormal posture: Secondary | ICD-10-CM

## 2023-12-11 DIAGNOSIS — M542 Cervicalgia: Secondary | ICD-10-CM
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
## 2023-12-14 ENCOUNTER — Ambulatory Visit: Payer: Medicaid Other | Admitting: Physical Therapy

## 2023-12-14 ENCOUNTER — Encounter: Payer: Self-pay | Admitting: Physical Therapy

## 2023-12-14 DIAGNOSIS — M6281 Muscle weakness (generalized): Secondary | ICD-10-CM

## 2023-12-14 DIAGNOSIS — R42 Dizziness and giddiness: Secondary | ICD-10-CM

## 2023-12-14 DIAGNOSIS — M542 Cervicalgia: Secondary | ICD-10-CM | POA: Diagnosis not present

## 2023-12-14 DIAGNOSIS — R293 Abnormal posture: Secondary | ICD-10-CM
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
## 2023-12-15 ENCOUNTER — Ambulatory Visit: Payer: Medicaid Other

## 2023-12-15 ENCOUNTER — Encounter: Payer: Self-pay | Admitting: Internal Medicine

## 2023-12-15 ENCOUNTER — Ambulatory Visit (INDEPENDENT_AMBULATORY_CARE_PROVIDER_SITE_OTHER): Payer: Medicaid Other | Admitting: Internal Medicine

## 2023-12-15 ENCOUNTER — Other Ambulatory Visit (HOSPITAL_COMMUNITY): Payer: Self-pay

## 2023-12-15 VITALS — BP 100/80 | HR 80 | Temp 98.2°F | Ht 66.0 in | Wt 192.0 lb

## 2023-12-15 DIAGNOSIS — M5030 Other cervical disc degeneration, unspecified cervical region: Secondary | ICD-10-CM | POA: Diagnosis not present

## 2023-12-15 DIAGNOSIS — S199XXA Unspecified injury of neck, initial encounter: Secondary | ICD-10-CM | POA: Diagnosis not present

## 2023-12-15 DIAGNOSIS — S4991XA Unspecified injury of right shoulder and upper arm, initial encounter: Secondary | ICD-10-CM | POA: Diagnosis not present

## 2023-12-15 MED ORDER — OXYCODONE HCL 5 MG PO TABS
5.0000 mg | ORAL_TABLET | ORAL | 0 refills | Status: DC | PRN
  Filled 2023-12-15: qty 30, 5d supply, fill #0

## 2023-12-15 NOTE — Progress Notes (Signed)
 Subjective:  Patient ID: Meghan Reed, female    DOB: Oct 14, 1986  Age: 38 y.o. MRN: [id number]  CC: Shoulder Pain (Right shoulder pain post rehab. Patient just started rehab with weight/resistance yesterday. Notes of major loss in mobility in right shoulder)   HPI Meghan Reed presents for f/up ----  She did neurorehab yesterday for dizziness and neck pain and felt like she injured or exacerbated her right-sided chronic neck pain and has new onset right shoulder pain with decreased range of motion in the right shoulder.  She has not gotten much symptom relief with flexeril.  Outpatient Medications Prior to Visit  Medication Sig Dispense Refill   albuterol (PROVENTIL) (2.5 MG/3ML) 0.083% nebulizer solution Take 3 mLs (2.5 mg total) by nebulization every 6 (six) hours as needed for wheezing or shortness of breath. 150 mL 1   albuterol (VENTOLIN HFA) 108 (90 Base) MCG/ACT inhaler Inhale 2 puffs into the lungs every 4 (four) hours as needed for wheezing or shortness of breath. 18 g 6   budesonide-formoterol (SYMBICORT) 160-4.5 MCG/ACT inhaler Inhale 2 puffs into the lungs 2 (two) times daily. 10.2 g 11   cyclobenzaprine (FLEXERIL) 10 MG tablet Take 1 tablet (10 mg total) by mouth every 8 (eight) hours as needed for muscle spasms. 60 tablet 0   desloratadine (CLARINEX) 5 MG tablet Take 1 tablet (5 mg total) by mouth daily. 90 tablet 3   ferrous sulfate 325 (65 FE) MG tablet Take 1 tablet (325 mg total) by mouth daily with breakfast. Please take with a source of Vitamin C 90 tablet 3   fluticasone (FLONASE) 50 MCG/ACT nasal spray Place 2 sprays into both nostrils daily. 16 g 6   Galcanezumab-gnlm (EMGALITY) 120 MG/ML SOAJ Inject 120 mg into the skin every 30 days. 3 mL 3   loratadine (CLARITIN) 10 MG tablet Take 1 tablet (10 mg total) by mouth daily. 30 tablet 3   omeprazole (PRILOSEC OTC) 20 MG tablet Take 1 tablet (20 mg total) by mouth daily. (Patient taking  differently: Take 20 mg by mouth daily as needed (acid reflux).) 30 tablet 3   Ubrogepant (UBRELVY) 100 MG TABS Take 1 tablet (100 mg) by mouth daily as needed. 8 tablet 11   No facility-administered medications prior to visit.    ROS Review of Systems  Constitutional: Negative.  Negative for chills and fatigue.  HENT: Negative.  Negative for trouble swallowing.   Eyes: Negative.   Respiratory: Negative.  Negative for chest tightness, shortness of breath and wheezing.   Cardiovascular:  Negative for chest pain, palpitations and leg swelling.  Gastrointestinal:  Negative for abdominal pain, diarrhea, nausea and vomiting.  Endocrine: Negative.   Genitourinary: Negative.  Negative for difficulty urinating.  Musculoskeletal:  Positive for arthralgias and neck pain.  Skin: Negative.   Neurological:  Negative for dizziness and weakness.  Hematological:  Negative for adenopathy. Does not bruise/bleed easily.  Psychiatric/Behavioral: Negative.      Objective:  BP 100/80 (BP Location: Left Arm, Patient Position: Sitting, Cuff Size: Normal)   Pulse 80   Temp 98.2 F (36.8 C) (Oral)   Ht 5\' 6"  (1.676 m)   Wt 192 lb (87.1 kg)   LMP  (LMP Unknown)   SpO2 99%   BMI 30.99 kg/m   BP Readings from Last 3 Encounters:  12/15/23 100/80  12/06/23 109/75  11/08/23 104/76    Wt Readings from Last 3 Encounters:  12/15/23 192 lb (87.1 kg)  09/11/23 196 lb (88.9  kg)  08/28/23 196 lb (88.9 kg)    Physical Exam Vitals reviewed.  Constitutional:      Appearance: Normal appearance.  HENT:     Mouth/Throat:     Mouth: Mucous membranes are moist.  Eyes:     General: No scleral icterus.    Conjunctiva/sclera: Conjunctivae normal.  Cardiovascular:     Rate and Rhythm: Normal rate and regular rhythm.     Pulses: Normal pulses.     Heart sounds: No murmur heard.    No friction rub. No gallop.  Pulmonary:     Effort: Pulmonary effort is normal.     Breath sounds: No stridor. No wheezing,  rhonchi or rales.  Abdominal:     General: Abdomen is flat.     Palpations: There is no mass.     Tenderness: There is no abdominal tenderness. There is no guarding.     Hernia: No hernia is present.  Musculoskeletal:     Right shoulder: Tenderness present. No swelling, deformity, effusion, bony tenderness or crepitus. Decreased range of motion.     Left shoulder: Normal.     Cervical back: Neck supple.     Right lower leg: No edema.     Left lower leg: No edema.  Lymphadenopathy:     Cervical: No cervical adenopathy.  Skin:    General: Skin is warm and dry.     Findings: No rash.  Neurological:     General: No focal deficit present.     Mental Status: She is alert.  Psychiatric:        Mood and Affect: Mood normal.        Behavior: Behavior normal.     Lab Results  Component Value Date   WBC 6.3 11/08/2023   HGB 12.6 11/08/2023   HCT 35.6 (L) 11/08/2023   PLT 237 11/08/2023   GLUCOSE 70 11/08/2023   CHOL 237 (H) 02/21/2023   TRIG 165 (H) 02/21/2023   HDL 57 02/21/2023   LDLCALC 150 (H) 02/21/2023   ALT 13 08/28/2023   AST 13 (L) 08/28/2023   NA 133 (L) 11/08/2023   K 3.4 (L) 11/08/2023   CL 100 11/08/2023   CREATININE 0.76 11/08/2023   BUN 7 11/08/2023   CO2 28 11/08/2023   TSH 0.909 12/21/2021   INR 1.5 (H) 10/31/2019   HGBA1C 5.0 02/21/2023    CT Angio Chest PE W and/or Wo Contrast Result Date: 11/08/2023 CLINICAL DATA:  Nausea and chest pain EXAM: CT ANGIOGRAPHY CHEST WITH CONTRAST TECHNIQUE: Multidetector CT imaging of the chest was performed using the standard protocol during bolus administration of intravenous contrast. Multiplanar CT image reconstructions and MIPs were obtained to evaluate the vascular anatomy. RADIATION DOSE REDUCTION: This exam was performed according to the departmental dose-optimization program which includes automated exposure control, adjustment of the mA and/or kV according to patient size and/or use of iterative reconstruction  technique. CONTRAST:  75mL OMNIPAQUE IOHEXOL 350 MG/ML SOLN COMPARISON:  Chest x-ray from earlier in the same day. FINDINGS: Cardiovascular: Thoracic aorta shows a normal branching pattern. No aneurysmal dilatation or dissection is seen. The pulmonary artery shows a normal branching pattern bilaterally. No filling defect to suggest pulmonary embolism is noted. Mediastinum/Nodes: Thoracic inlet is within normal limits. No hilar or mediastinal adenopathy is noted. The esophagus as visualized is within normal limits. Lungs/Pleura: Lungs are well aerated bilaterally. No focal infiltrate or effusion is seen. Upper Abdomen: Visualized upper abdomen is within normal limits. Musculoskeletal: No chest wall  abnormality. No acute or significant osseous findings. Review of the MIP images confirms the above findings. IMPRESSION: No evidence of pulmonary emboli. No other focal abnormality is noted. Electronically Signed   By: Alcide Clever M.D.   On: 11/08/2023 20:00   DG Chest 2 View Result Date: 11/08/2023 CLINICAL DATA:  Chest pain and shortness of breath for 2 days. EXAM: CHEST - 2 VIEW COMPARISON:  09/22/2022 FINDINGS: The heart size and mediastinal contours are within normal limits. Both lungs are clear. The visualized skeletal structures are unremarkable. IMPRESSION: Normal exam. Electronically Signed   By: Danae Orleans M.D.   On: 11/08/2023 15:24   DG Cervical Spine Complete Result Date: 12/15/2023 CLINICAL DATA:  Neck and right shoulder pain. EXAM: CERVICAL SPINE - COMPLETE 4+ VIEW COMPARISON:  Cervical spine x-rays dated August 25, 2023. FINDINGS: The lateral view is diagnostic to the C7-T1 level. There is no acute fracture or subluxation. Vertebral body heights are preserved. Alignment is normal. Unchanged minimal disc height loss at C5-C6. Remaining intervertebral disc spaces are maintained. No bony neuroforaminal stenosis. Normal prevertebral soft tissues. IMPRESSION: 1. Unchanged minimal degenerative disc  disease at C5-C6. Electronically Signed   By: Obie Dredge M.D.   On: 12/15/2023 13:29   DG Shoulder Right Result Date: 12/15/2023 CLINICAL DATA:  Right shoulder pain.  Recent dislocation. EXAM: RIGHT SHOULDER - 2+ VIEW COMPARISON:  None Available. FINDINGS: There is no evidence of fracture or dislocation. There is no evidence of arthropathy or other focal bone abnormality. Soft tissues are unremarkable. IMPRESSION: Negative. Electronically Signed   By: Obie Dredge M.D.   On: 12/15/2023 13:27      Assessment & Plan:   Right shoulder injury, initial encounter -     DG Shoulder Right; Future  Neck injury, initial encounter -     DG Cervical Spine Complete; Future  DDD (degenerative disc disease), cervical- NSAIDS are not an option. Will control the pain with oxycodone. -     oxyCODONE HCl; Take 1 tablet (5 mg total) by mouth every 4 (four) hours as needed for severe pain (pain score 7-10).  Dispense: 30 tablet; Refill: 0     Follow-up: No follow-ups on file.  Sanda Linger, MD

## 2023-12-16 ENCOUNTER — Encounter: Payer: Self-pay | Admitting: Internal Medicine

## 2023-12-16 NOTE — Patient Instructions (Signed)
Shoulder Pain Many things can cause shoulder pain, including: An injury to the shoulder. Overuse of the shoulder. Arthritis. The source of the pain can be: Inflammation. An injury to the shoulder joint. An injury to a tendon, ligament, or bone. Follow these instructions at home: Pay attention to changes in your symptoms. Let your health care provider know about them. Follow these instructions to relieve your pain. If you have a removable sling: Wear the sling as told by your provider. Remove it only as told by your provider. Check the skin around the sling every day. Tell your provider about any concerns. Loosen the sling if your fingers tingle, become numb, or become cold. Keep the sling clean. If the sling is not waterproof: Do not let it get wet. Remove it to shower or bathe. Move your arm as little as possible, but keep your hand moving to prevent swelling. Managing pain, stiffness, and swelling  If told, put ice on the painful area. If you have a removable sling or immobilizer, remove it as told by your provider. Put ice in a plastic bag. Place a towel between your skin and the bag. Leave the ice on for 20 minutes, 2-3 times a day. If your skin turns bright red, remove the ice right away to prevent skin damage. The risk of damage is higher if you cannot feel pain, heat, or cold. Move your fingers often to reduce stiffness and swelling. Squeeze a soft ball or a foam pad as much as possible. This helps to keep the shoulder from swelling. It also helps to strengthen the arm. General instructions Take over-the-counter and prescription medicines only as told by your provider. Exercise may help with pain management. Perform exercises if told by your provider. You may be referred to a physical therapist to help in your recovery process. Keep all follow-up visits in order to avoid any type of permanent shoulder disability or chronic pain problems. Contact a health care provider  if: Your pain is not relieved with medicines. New pain develops in your arm, hand, or fingers. You loosen your sling and your arm, hand, or fingers remain tingly, numb, swollen, or painful. Get help right away if: Your arm, hand, or fingers turn white or blue. This information is not intended to replace advice given to you by your health care provider. Make sure you discuss any questions you have with your health care provider. Document Revised: 05/20/2022 Document Reviewed: 05/20/2022 Elsevier Patient Education  2024 [employer]  

## 2023-12-18 ENCOUNTER — Ambulatory Visit: Payer: Medicaid Other | Admitting: Physical Therapy
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
## 2023-12-21 ENCOUNTER — Encounter: Payer: Self-pay | Admitting: Physical Therapy

## 2023-12-21 ENCOUNTER — Ambulatory Visit: Payer: Medicaid Other | Admitting: Physical Therapy

## 2023-12-21 DIAGNOSIS — M542 Cervicalgia: Secondary | ICD-10-CM

## 2023-12-21 DIAGNOSIS — M6281 Muscle weakness (generalized): Secondary | ICD-10-CM

## 2023-12-21 DIAGNOSIS — R293 Abnormal posture: Secondary | ICD-10-CM

## 2023-12-21 DIAGNOSIS — R42 Dizziness and giddiness: Secondary | ICD-10-CM

## 2023-12-23 ENCOUNTER — Ambulatory Visit
Admission: RE | Admit: 2023-12-23 | Discharge: 2023-12-23 | Disposition: A | Discharge status: Home / Self Care | Payer: Medicaid Other | Source: Ambulatory Visit | Attending: Orthopaedic Surgery | Admitting: Orthopaedic Surgery

## 2023-12-23 DIAGNOSIS — G8929 Other chronic pain: Secondary | ICD-10-CM

## 2023-12-25 ENCOUNTER — Ambulatory Visit: Payer: Medicaid Other | Admitting: Physical Therapy

## 2023-12-25 ENCOUNTER — Ambulatory Visit (HOSPITAL_BASED_OUTPATIENT_CLINIC_OR_DEPARTMENT_OTHER): Payer: Medicaid Other | Admitting: Orthopaedic Surgery

## 2023-12-25 ENCOUNTER — Other Ambulatory Visit (HOSPITAL_BASED_OUTPATIENT_CLINIC_OR_DEPARTMENT_OTHER): Payer: Self-pay

## 2023-12-25 ENCOUNTER — Encounter: Payer: Self-pay | Admitting: Hematology and Oncology

## 2023-12-25 DIAGNOSIS — M7541 Impingement syndrome of right shoulder: Secondary | ICD-10-CM

## 2023-12-25 DIAGNOSIS — R293 Abnormal posture: Secondary | ICD-10-CM

## 2023-12-25 DIAGNOSIS — M25511 Pain in right shoulder: Secondary | ICD-10-CM

## 2023-12-25 DIAGNOSIS — M542 Cervicalgia: Secondary | ICD-10-CM | POA: Diagnosis not present

## 2023-12-25 DIAGNOSIS — G8929 Other chronic pain: Secondary | ICD-10-CM | POA: Diagnosis not present

## 2023-12-25 MED ORDER — IBUPROFEN 800 MG PO TABS
800.0000 mg | ORAL_TABLET | Freq: Three times a day (TID) | ORAL | 0 refills | Status: AC
  Filled 2023-12-25: qty 30, 10d supply, fill #0

## 2023-12-25 MED ORDER — ASPIRIN 325 MG PO TBEC
325.0000 mg | DELAYED_RELEASE_TABLET | Freq: Every day | ORAL | 0 refills | Status: DC
  Filled 2023-12-25: qty 14, 14d supply, fill #0

## 2023-12-25 MED ORDER — OXYCODONE HCL 5 MG PO TABS
5.0000 mg | ORAL_TABLET | ORAL | 0 refills | Status: DC | PRN
  Filled 2023-12-25: qty 5, 1d supply, fill #0

## 2023-12-25 MED ORDER — ACETAMINOPHEN 500 MG PO TABS
500.0000 mg | ORAL_TABLET | Freq: Three times a day (TID) | ORAL | 0 refills | Status: AC
  Filled 2023-12-25: qty 30, 10d supply, fill #0
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
## 2023-12-26 ENCOUNTER — Encounter: Payer: Self-pay | Admitting: Physical Therapy

## 2023-12-28 ENCOUNTER — Ambulatory Visit: Payer: Medicaid Other | Admitting: Physical Therapy

## 2024-01-01 ENCOUNTER — Ambulatory Visit: Payer: Self-pay

## 2024-01-04 ENCOUNTER — Ambulatory Visit: Payer: Self-pay | Admitting: Physical Therapy

## 2024-01-04 ENCOUNTER — Other Ambulatory Visit (HOSPITAL_COMMUNITY): Payer: Self-pay

## 2024-01-04 ENCOUNTER — Telehealth: Payer: Self-pay | Admitting: Pharmacy Technician

## 2024-01-04 NOTE — Telephone Encounter (Signed)
 Pharmacy Patient Advocate Encounter   Received notification from CoverMyMeds that prior authorization for Ubrelvy 100MG  tablets is required/requested.   Insurance verification completed.   The patient is insured through [employer] [place] .   Per test claim: PA required; PA submitted to above mentioned insurance via CoverMyMeds Key/confirmation #/EOC [id number] Status is pending

## 2024-01-04 NOTE — Telephone Encounter (Signed)
 Pharmacy Patient Advocate Encounter  Received notification from [facility] Medicaid that Prior Authorization for Ubrelvy 100MG  tablets  has been APPROVED from 03/069/2025 to 01/03/2025   PA #/Case ID/Reference #: [id number]
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
## 2024-01-12 ENCOUNTER — Other Ambulatory Visit: Payer: Self-pay

## 2024-01-12 ENCOUNTER — Encounter (HOSPITAL_BASED_OUTPATIENT_CLINIC_OR_DEPARTMENT_OTHER): Payer: Self-pay | Admitting: Orthopaedic Surgery

## 2024-01-12 NOTE — Progress Notes (Signed)
   01/12/24 1515  PAT Phone Screen  Is the patient taking a GLP-1 receptor agonist? No  Do You Have Diabetes? No  Do You Have Hypertension? No  Have You Ever Been to the ER for Asthma? No  Have You Taken Oral Steroids in the Past 3 Months? No  Do you Take Phenteramine or any Other Diet Drugs? No  Recent  Lab Work, EKG, CXR? (S)  Yes  Where was this test performed? (S)  EKG 11/13/23 [facility]  Do you have a history of heart problems? No  Have you ever had tests on your heart? (S)  Yes  What cardiac tests were performed? (S)  Echo;Stress Test;Other (comment)  What date/year were cardiac tests completed? (S)  01/04/22 Zio monitor PACsPVCs, Negative Stress test 12/2021, Echo EF 50-55% 04/19/22  Results viewable: (S)  [facility] Media Tab  Any Recent Hospitalizations? No  Height 5\' 6"  (1.676 m)  Weight 87.5 kg  Pat Appointment Scheduled No  Reason for No Appointment Not Needed

## 2024-01-16 ENCOUNTER — Other Ambulatory Visit: Payer: Self-pay

## 2024-01-16 ENCOUNTER — Other Ambulatory Visit: Payer: Self-pay | Admitting: Student

## 2024-01-16 ENCOUNTER — Encounter: Payer: Self-pay | Admitting: Hematology and Oncology

## 2024-01-16 ENCOUNTER — Other Ambulatory Visit (HOSPITAL_COMMUNITY): Payer: Self-pay

## 2024-01-16 DIAGNOSIS — M5412 Radiculopathy, cervical region: Secondary | ICD-10-CM

## 2024-01-18 ENCOUNTER — Telehealth: Payer: Self-pay | Admitting: Student

## 2024-01-18 ENCOUNTER — Other Ambulatory Visit (HOSPITAL_COMMUNITY): Payer: Self-pay

## 2024-01-18 ENCOUNTER — Ambulatory Visit (HOSPITAL_BASED_OUTPATIENT_CLINIC_OR_DEPARTMENT_OTHER): Payer: Self-pay | Admitting: Orthopaedic Surgery

## 2024-01-18 ENCOUNTER — Ambulatory Visit: Admitting: Nurse Practitioner

## 2024-01-18 VITALS — BP 96/70 | HR 102 | Temp 97.9°F | Ht 66.0 in | Wt 195.0 lb

## 2024-01-18 DIAGNOSIS — J4521 Mild intermittent asthma with (acute) exacerbation: Secondary | ICD-10-CM | POA: Diagnosis not present

## 2024-01-18 DIAGNOSIS — M7541 Impingement syndrome of right shoulder: Secondary | ICD-10-CM

## 2024-01-18 MED ORDER — BUDESONIDE-FORMOTEROL FUMARATE 160-4.5 MCG/ACT IN AERO
2.0000 | INHALATION_SPRAY | Freq: Two times a day (BID) | RESPIRATORY_TRACT | 2 refills | Status: AC
  Filled 2024-01-18: qty 10.2, 30d supply, fill #0
  Filled 2024-02-19 – 2024-06-17 (×3): qty 10.2, 30d supply, fill #1

## 2024-01-18 MED ORDER — ALBUTEROL SULFATE HFA 108 (90 BASE) MCG/ACT IN AERS
2.0000 | INHALATION_SPRAY | RESPIRATORY_TRACT | 6 refills | Status: AC | PRN
  Filled 2024-01-18: qty 18, 20d supply, fill #0
  Filled 2024-04-16: qty 18, 20d supply, fill #1

## 2024-01-18 MED ORDER — IPRATROPIUM-ALBUTEROL 0.5-2.5 (3) MG/3ML IN SOLN
3.0000 mL | Freq: Once | RESPIRATORY_TRACT | Status: AC
  Administered 2024-01-18: 3 mL via RESPIRATORY_TRACT

## 2024-01-18 MED ORDER — PREDNISONE 20 MG PO TABS
40.0000 mg | ORAL_TABLET | Freq: Every day | ORAL | 0 refills | Status: DC
  Filled 2024-01-18: qty 10, 5d supply, fill #0

## 2024-01-18 MED ORDER — ALBUTEROL SULFATE (2.5 MG/3ML) 0.083% IN NEBU
2.5000 mg | INHALATION_SOLUTION | Freq: Four times a day (QID) | RESPIRATORY_TRACT | 1 refills | Status: AC | PRN
  Filled 2024-01-18: qty 180, 15d supply, fill #0
  Filled 2024-04-16: qty 180, 15d supply, fill #1

## 2024-01-18 NOTE — Telephone Encounter (Signed)
 Ventolin & Symbicort needed refilling. Former Dr. Thora Lance PT. She is having surgery next week and needs them ASAP.  Pharm: [facility]  Her # is [phone]

## 2024-01-18 NOTE — Assessment & Plan Note (Signed)
 Chronic, acute exacerbation.  Treat with in-office administered duoneb today Send refills for albuterol inhaler, albuterol via nebulizer to be used as needed for cough, wheezing, shortness of breath. Send refills for Symbicort for maintenance therapy, encourage patient to remember to swish and spit after use. Will prescribe prednisone 40 mg by mouth every morning x 5 days if patient feels symptoms are not able to be managed with breathing treatments alone at this time. Follow-up with pulmonology if symptoms worsen despite above treatment plan.

## 2024-01-18 NOTE — Progress Notes (Signed)
   Established Patient Office Visit  Subjective   Patient ID: Meghan Reed, female    DOB: 09/29/1986  Age: 38 y.o. MRN: [id number]  Chief Complaint  Patient presents with   Asthma    Patient has today for asthma exacerbation.  She reports yesterday started experiencing cough and wheezing.  Has been using albuterol inhaler as well as as needed albuterol nebulized.  She also takes Symbicort chronically and took her last dose yesterday will need a refill today.  Tells me that her pulmonologist recently left her practice thus, she was unable to get into see them regarding her asthma for an acute visit. She does have an upcoming appointment with a new pulmonologist in May. She reports that her last exacerbation requiring steroid treatment was about 1 year ago.    Review of Systems  Respiratory:  Positive for cough, shortness of breath and wheezing.       Objective:     BP 96/70   Pulse (!) 102   Temp 97.9 F (36.6 C) (Temporal)   Ht 5\' 6"  (1.676 m)   Wt 195 lb (88.5 kg)   LMP  (LMP Unknown)   SpO2 99%   BMI 31.47 kg/m    Physical Exam Vitals reviewed.  Constitutional:      General: She is not in acute distress.    Appearance: Normal appearance.  HENT:     Head: Normocephalic and atraumatic.  Neck:     Vascular: No carotid bruit.  Cardiovascular:     Rate and Rhythm: Normal rate and regular rhythm.     Pulses: Normal pulses.     Heart sounds: Normal heart sounds.  Pulmonary:     Effort: Pulmonary effort is normal.     Breath sounds: Wheezing present.  Skin:    General: Skin is warm and dry.  Neurological:     General: No focal deficit present.     Mental Status: She is alert and oriented to person, place, and time.  Psychiatric:        Mood and Affect: Mood normal.        Behavior: Behavior normal.        Judgment: Judgment normal.      No results found for any visits on 01/18/24.    The ASCVD Risk score (Arnett DK, et al., 2019) failed  to calculate for the following reasons:   The 2019 ASCVD risk score is only valid for ages 27 to 29    Assessment & Plan:   Problem List Items Addressed This Visit       Respiratory   Asthma - Primary   Chronic, acute exacerbation.  Treat with in-office administered duoneb today Send refills for albuterol inhaler, albuterol via nebulizer to be used as needed for cough, wheezing, shortness of breath. Send refills for Symbicort for maintenance therapy, encourage patient to remember to swish and spit after use. Will prescribe prednisone 40 mg by mouth every morning x 5 days if patient feels symptoms are not able to be managed with breathing treatments alone at this time. Follow-up with pulmonology if symptoms worsen despite above treatment plan.      Relevant Medications   albuterol (PROVENTIL) (2.5 MG/3ML) 0.083% nebulizer solution   budesonide-formoterol (SYMBICORT) 160-4.5 MCG/ACT inhaler   albuterol (VENTOLIN HFA) 108 (90 Base) MCG/ACT inhaler   predniSONE (DELTASONE) 20 MG tablet    Return if symptoms worsen or fail to improve.    Elenore Paddy, NP

## 2024-01-18 NOTE — Telephone Encounter (Signed)
 Called and spoke with patient.  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  She said she was completely out of her medications.  She said she has everything she needs right now.  I advised her to call back if she needed anything between now and the time of her follow up on 5/5.  She verbalized understanding.  Nothing further needed.

## 2024-01-18 NOTE — Progress Notes (Signed)

## 2024-01-22 ENCOUNTER — Encounter (HOSPITAL_BASED_OUTPATIENT_CLINIC_OR_DEPARTMENT_OTHER)
Admission: RE | Disposition: A | Discharge status: Home / Self Care | Payer: Self-pay | Source: Ambulatory Visit | Attending: Orthopaedic Surgery

## 2024-01-22 ENCOUNTER — Ambulatory Visit: Payer: Medicaid Other | Admitting: Orthopedic Surgery

## 2024-01-22 ENCOUNTER — Encounter (HOSPITAL_BASED_OUTPATIENT_CLINIC_OR_DEPARTMENT_OTHER): Payer: Self-pay | Admitting: Orthopaedic Surgery

## 2024-01-22 ENCOUNTER — Ambulatory Visit (HOSPITAL_BASED_OUTPATIENT_CLINIC_OR_DEPARTMENT_OTHER)
Admission: RE | Admit: 2024-01-22 | Discharge: 2024-01-22 | Disposition: A | Discharge status: Home / Self Care | Payer: Medicaid Other | Source: Ambulatory Visit | Attending: Orthopaedic Surgery | Admitting: Orthopaedic Surgery

## 2024-01-22 ENCOUNTER — Other Ambulatory Visit: Payer: Self-pay

## 2024-01-22 ENCOUNTER — Ambulatory Visit (HOSPITAL_BASED_OUTPATIENT_CLINIC_OR_DEPARTMENT_OTHER): Admitting: Anesthesiology

## 2024-01-22 DIAGNOSIS — E669 Obesity, unspecified: Secondary | ICD-10-CM | POA: Diagnosis not present

## 2024-01-22 DIAGNOSIS — Z86711 Personal history of pulmonary embolism: Secondary | ICD-10-CM | POA: Insufficient documentation

## 2024-01-22 DIAGNOSIS — J45909 Unspecified asthma, uncomplicated: Secondary | ICD-10-CM | POA: Diagnosis not present

## 2024-01-22 DIAGNOSIS — K219 Gastro-esophageal reflux disease without esophagitis: Secondary | ICD-10-CM | POA: Insufficient documentation

## 2024-01-22 DIAGNOSIS — M25811 Other specified joint disorders, right shoulder: Secondary | ICD-10-CM | POA: Insufficient documentation

## 2024-01-22 DIAGNOSIS — M25512 Pain in left shoulder: Secondary | ICD-10-CM | POA: Diagnosis present

## 2024-01-22 DIAGNOSIS — M19011 Primary osteoarthritis, right shoulder: Secondary | ICD-10-CM | POA: Diagnosis not present

## 2024-01-22 DIAGNOSIS — Z7951 Long term (current) use of inhaled steroids: Secondary | ICD-10-CM | POA: Diagnosis not present

## 2024-01-22 DIAGNOSIS — M7541 Impingement syndrome of right shoulder: Secondary | ICD-10-CM

## 2024-01-22 DIAGNOSIS — Z87891 Personal history of nicotine dependence: Secondary | ICD-10-CM | POA: Insufficient documentation

## 2024-01-22 DIAGNOSIS — Z6831 Body mass index (BMI) 31.0-31.9, adult: Secondary | ICD-10-CM | POA: Insufficient documentation

## 2024-01-22 DIAGNOSIS — M19019 Primary osteoarthritis, unspecified shoulder: Secondary | ICD-10-CM

## 2024-01-22 HISTORY — PX: SHOULDER ARTHROSCOPY WITH DISTAL CLAVICLE RESECTION: SHX5675

## 2024-01-22 HISTORY — PX: SHOULDER ACROMIOPLASTY: SHX6093

## 2024-01-22 SURGERY — SHOULDER ARTHROSCOPY WITH DISTAL CLAVICLE RESECTION
Anesthesia: General | Site: Shoulder | Laterality: Right

## 2024-01-22 MED ORDER — SODIUM CHLORIDE 0.9 % IR SOLN
Status: DC | PRN
  Administered 2024-01-22: 6000 mL

## 2024-01-22 MED ORDER — PROPOFOL 10 MG/ML IV BOLUS
INTRAVENOUS | Status: DC | PRN
  Administered 2024-01-22: 150 mg via INTRAVENOUS

## 2024-01-22 MED ORDER — FENTANYL CITRATE (PF) 100 MCG/2ML IJ SOLN
INTRAMUSCULAR | Status: AC
  Filled 2024-01-22: qty 2

## 2024-01-22 MED ORDER — GABAPENTIN 300 MG PO CAPS
300.0000 mg | ORAL_CAPSULE | Freq: Once | ORAL | Status: AC
  Administered 2024-01-22: 300 mg via ORAL

## 2024-01-22 MED ORDER — OXYCODONE HCL 5 MG/5ML PO SOLN: 5.0000 mg | Freq: Once | ORAL | Status: AC | PRN

## 2024-01-22 MED ORDER — FENTANYL CITRATE (PF) 100 MCG/2ML IJ SOLN
100.0000 ug | Freq: Once | INTRAMUSCULAR | Status: AC
  Administered 2024-01-22: 50 ug via INTRAVENOUS

## 2024-01-22 MED ORDER — MIDAZOLAM HCL 5 MG/5ML IJ SOLN
INTRAMUSCULAR | Status: DC | PRN
  Administered 2024-01-22: 2 mg via INTRAVENOUS

## 2024-01-22 MED ORDER — TRANEXAMIC ACID-NACL 1000-0.7 MG/100ML-% IV SOLN
1000.0000 mg | INTRAVENOUS | Status: AC
  Administered 2024-01-22: 1000 mg via INTRAVENOUS

## 2024-01-22 MED ORDER — ACETAMINOPHEN 500 MG PO TABS
ORAL_TABLET | ORAL | Status: AC
  Filled 2024-01-22: qty 2

## 2024-01-22 MED ORDER — BUPIVACAINE LIPOSOME 1.3 % IJ SUSP
INTRAMUSCULAR | Status: DC | PRN
Start: 2024-01-22 — End: 2024-01-22
  Administered 2024-01-22: 10 mL via PERINEURAL

## 2024-01-22 MED ORDER — OXYCODONE HCL 5 MG PO TABS
5.0000 mg | ORAL_TABLET | Freq: Once | ORAL | Status: AC | PRN
  Administered 2024-01-22: 5 mg via ORAL

## 2024-01-22 MED ORDER — BUPIVACAINE HCL (PF) 0.5 % IJ SOLN
INTRAMUSCULAR | Status: DC | PRN
Start: 2024-01-22 — End: 2024-01-22
  Administered 2024-01-22: 15 mL via PERINEURAL

## 2024-01-22 MED ORDER — OXYCODONE HCL 5 MG PO TABS
ORAL_TABLET | ORAL | Status: AC
  Filled 2024-01-22: qty 1

## 2024-01-22 MED ORDER — MIDAZOLAM HCL 2 MG/2ML IJ SOLN
INTRAMUSCULAR | Status: AC
  Filled 2024-01-22: qty 2

## 2024-01-22 MED ORDER — LACTATED RINGERS IV SOLN: INTRAVENOUS | Status: DC

## 2024-01-22 MED ORDER — FENTANYL CITRATE (PF) 250 MCG/5ML IJ SOLN
INTRAMUSCULAR | Status: DC | PRN
  Administered 2024-01-22: 100 ug via INTRAVENOUS

## 2024-01-22 MED ORDER — PROPOFOL 10 MG/ML IV BOLUS
INTRAVENOUS | Status: AC
  Filled 2024-01-22: qty 20

## 2024-01-22 MED ORDER — VANCOMYCIN HCL 1000 MG IV SOLR
INTRAVENOUS | Status: AC
  Filled 2024-01-22: qty 40

## 2024-01-22 MED ORDER — DEXAMETHASONE SODIUM PHOSPHATE 10 MG/ML IJ SOLN
INTRAMUSCULAR | Status: AC
  Filled 2024-01-22: qty 1

## 2024-01-22 MED ORDER — ROCURONIUM BROMIDE 10 MG/ML (PF) SYRINGE
PREFILLED_SYRINGE | INTRAVENOUS | Status: DC | PRN
  Administered 2024-01-22: 140 mg via INTRAVENOUS

## 2024-01-22 MED ORDER — TRANEXAMIC ACID-NACL 1000-0.7 MG/100ML-% IV SOLN
INTRAVENOUS | Status: AC
  Filled 2024-01-22: qty 100

## 2024-01-22 MED ORDER — GABAPENTIN 300 MG PO CAPS
ORAL_CAPSULE | ORAL | Status: AC
  Filled 2024-01-22: qty 1

## 2024-01-22 MED ORDER — ONDANSETRON HCL 4 MG/2ML IJ SOLN: 4.0000 mg | Freq: Once | INTRAMUSCULAR | Status: DC | PRN

## 2024-01-22 MED ORDER — PROPOFOL 500 MG/50ML IV EMUL
INTRAVENOUS | Status: AC
  Filled 2024-01-22: qty 50

## 2024-01-22 MED ORDER — PHENYLEPHRINE HCL-NACL 20-0.9 MG/250ML-% IV SOLN
INTRAVENOUS | Status: DC | PRN
  Administered 2024-01-22: 40 ug via INTRAVENOUS
  Administered 2024-01-22: 80 ug via INTRAVENOUS

## 2024-01-22 MED ORDER — ONDANSETRON HCL 4 MG/2ML IJ SOLN
INTRAMUSCULAR | Status: DC | PRN
  Administered 2024-01-22: 4 mg via INTRAVENOUS

## 2024-01-22 MED ORDER — CEFAZOLIN SODIUM-DEXTROSE 2-4 GM/100ML-% IV SOLN
INTRAVENOUS | Status: AC
  Filled 2024-01-22: qty 100

## 2024-01-22 MED ORDER — SUGAMMADEX SODIUM 200 MG/2ML IV SOLN
INTRAVENOUS | Status: DC | PRN
  Administered 2024-01-22: 100 mg via INTRAVENOUS
  Administered 2024-01-22: 200 mg via INTRAVENOUS

## 2024-01-22 MED ORDER — FENTANYL CITRATE (PF) 100 MCG/2ML IJ SOLN: 25.0000 ug | INTRAMUSCULAR | Status: DC | PRN

## 2024-01-22 MED ORDER — DEXMEDETOMIDINE HCL IN NACL 80 MCG/20ML IV SOLN
INTRAVENOUS | Status: AC
  Filled 2024-01-22: qty 20

## 2024-01-22 MED ORDER — DEXAMETHASONE SODIUM PHOSPHATE 10 MG/ML IJ SOLN
INTRAMUSCULAR | Status: DC | PRN
  Administered 2024-01-22: 10 mg via INTRAVENOUS

## 2024-01-22 MED ORDER — CEFAZOLIN SODIUM-DEXTROSE 2-4 GM/100ML-% IV SOLN
2.0000 g | INTRAVENOUS | Status: AC
  Administered 2024-01-22: 2 g via INTRAVENOUS

## 2024-01-22 MED ORDER — ONDANSETRON HCL 4 MG/2ML IJ SOLN
INTRAMUSCULAR | Status: AC
  Filled 2024-01-22: qty 4

## 2024-01-22 MED ORDER — MIDAZOLAM HCL 2 MG/2ML IJ SOLN
2.0000 mg | Freq: Once | INTRAMUSCULAR | Status: AC
Start: 2024-01-22 — End: 2024-01-22
  Administered 2024-01-22: 1 mg via INTRAVENOUS

## 2024-01-22 MED ORDER — ACETAMINOPHEN 500 MG PO TABS
1000.0000 mg | ORAL_TABLET | Freq: Once | ORAL | Status: AC
  Administered 2024-01-22: 1000 mg via ORAL

## 2024-01-22 MED ORDER — LIDOCAINE 2% (20 MG/ML) 5 ML SYRINGE
INTRAMUSCULAR | Status: DC | PRN
  Administered 2024-01-22: 60 mg via INTRAVENOUS

## 2024-01-22 MED ORDER — ROCURONIUM BROMIDE 10 MG/ML (PF) SYRINGE
PREFILLED_SYRINGE | INTRAVENOUS | Status: AC
  Filled 2024-01-22: qty 10

## 2024-01-22 MED ORDER — PHENYLEPHRINE 80 MCG/ML (10ML) SYRINGE FOR IV PUSH (FOR BLOOD PRESSURE SUPPORT)
PREFILLED_SYRINGE | INTRAVENOUS | Status: AC
  Filled 2024-01-22: qty 10

## 2024-01-22 SURGICAL SUPPLY — 49 items
BLADE EXCALIBUR 4.0X13 (MISCELLANEOUS) ×2 IMPLANT
BURR OVAL 8 FLU 4.0X13 (MISCELLANEOUS) ×2 IMPLANT
CANNULA 5.75X71 LONG (CANNULA) IMPLANT
CANNULA PASSPORT 5 (CANNULA) IMPLANT
CANNULA PASSPORT BUTTON 10-40 (CANNULA) IMPLANT
CANNULA TWIST IN 8.25X7CM (CANNULA) IMPLANT
CHLORAPREP W/TINT 26 (MISCELLANEOUS) ×4 IMPLANT
COOLER ICEMAN CLASSIC (MISCELLANEOUS) ×2 IMPLANT
DRAPE IMP U-DRAPE 54X76 (DRAPES) ×2 IMPLANT
DRAPE INCISE IOBAN 66X45 STRL (DRAPES) ×2 IMPLANT
DRAPE SHOULDER BEACH CHAIR (DRAPES) ×2 IMPLANT
DRAPE U-SHAPE 47X51 STRL (DRAPES) ×4 IMPLANT
DW OUTFLOW CASSETTE/TUBE SET (MISCELLANEOUS) ×2 IMPLANT
GAUZE PAD ABD 8X10 STRL (GAUZE/BANDAGES/DRESSINGS) ×2 IMPLANT
GAUZE SPONGE 4X4 12PLY STRL (GAUZE/BANDAGES/DRESSINGS) ×2 IMPLANT
GAUZE XEROFORM 1X8 LF (GAUZE/BANDAGES/DRESSINGS) ×2 IMPLANT
GLOVE BIO SURGEON STRL SZ 6 (GLOVE) ×2 IMPLANT
GLOVE BIO SURGEON STRL SZ7.5 (GLOVE) ×2 IMPLANT
GLOVE BIOGEL PI IND STRL 6.5 (GLOVE) ×2 IMPLANT
GLOVE BIOGEL PI IND STRL 8 (GLOVE) ×2 IMPLANT
GOWN STRL REUS W/ TWL LRG LVL3 (GOWN DISPOSABLE) ×4 IMPLANT
GOWN STRL REUS W/ TWL XL LVL3 (GOWN DISPOSABLE) ×2 IMPLANT
KIT STABILIZATION SHOULDER (MISCELLANEOUS) ×2 IMPLANT
KIT STR SPEAR 1.8 FBRTK DISP (KITS) IMPLANT
LASSO 90 CVE QUICKPAS (DISPOSABLE) IMPLANT
LASSO CRESCENT QUICKPASS (SUTURE) IMPLANT
MANIFOLD NEPTUNE II (INSTRUMENTS) ×2 IMPLANT
NDL HD SCORPION MEGA LOADER (NEEDLE) IMPLANT
PACK ARTHROSCOPY DSU (CUSTOM PROCEDURE TRAY) ×2 IMPLANT
PACK BASIN DAY SURGERY FS (CUSTOM PROCEDURE TRAY) ×2 IMPLANT
PAD COLD SHLDR WRAP-ON (PAD) ×2 IMPLANT
RESTRAINT HEAD UNIVERSAL NS (MISCELLANEOUS) ×2 IMPLANT
SHEET MEDIUM DRAPE 40X70 STRL (DRAPES) ×2 IMPLANT
SLEEVE SCD COMPRESS KNEE MED (STOCKING) ×2 IMPLANT
SUT ETHILON 3 0 PS 1 (SUTURE) ×2 IMPLANT
SUT FIBERWIRE #2 38 T-5 BLUE (SUTURE) IMPLANT
SUT PDS AB 1 CT 36 (SUTURE) IMPLANT
SUT TIGER TAPE 7 IN WHITE (SUTURE) IMPLANT
SUT VIC AB 0 CT2 27 (SUTURE) IMPLANT
SUTURE FIBERWR #2 38 T-5 BLUE (SUTURE) IMPLANT
SUTURE TAPE 1.3 40 TPR END (SUTURE) IMPLANT
SUTURE TAPE TIGERLINK 1.3MM BL (SUTURE) IMPLANT
SUTURETAPE 1.3 40 TPR END (SUTURE) IMPLANT
SUTURETAPE TIGERLINK 1.3MM BL (SUTURE) IMPLANT
TAPE FIBER 2MM 7IN #2 BLUE (SUTURE) IMPLANT
TOWEL GREEN STERILE FF (TOWEL DISPOSABLE) ×4 IMPLANT
TUBE CONNECTING 20X1/4 (TUBING) ×2 IMPLANT
TUBING ARTHROSCOPY IRRIG 16FT (MISCELLANEOUS) ×2 IMPLANT
WAND ABLATOR APOLLO I90 (BUR) ×2 IMPLANT

## 2024-01-22 NOTE — Interval H&P Note (Signed)
 History and Physical Interval Note:  01/22/2024 12:14 PM  Meghan Reed  has presented today for surgery, with the diagnosis of RIGHT ROTATOR CUFF IMPINGEMENT.  The various methods of treatment have been discussed with the patient and family. After consideration of risks, benefits and other options for treatment, the patient has consented to  Procedure(s): RIGHT SHOULDER ARTHROSCOPY WITH DISTAL CLAVICLE RESECTION / ACROMIOPLASTY/ BURSECTOMY (Right) SHOULDER ACROMIOPLASTY/BURSECTOMY (Right) as a surgical intervention.  The patient's history has been reviewed, patient examined, no change in status, stable for surgery.  I have reviewed the patient's chart and labs.  Questions were answered to the patient's satisfaction.     [name]

## 2024-01-22 NOTE — Op Note (Signed)
 Date of Surgery: 01/22/2024  INDICATIONS: [name] is a 38 y.o.-year-old female with right shoulder rotator cuff impingment.  The risk and benefits of the procedure were discussed in detail and documented in the pre-operative evaluation.   PREOPERATIVE DIAGNOSES: Right shoulder, subacromial impingement.  POSTOPERATIVE DIAGNOSIS: Same.  PROCEDURE: Arthroscopic extensive debridement - 29823 Subdeltoid Bursa, Supraspinatus Tendon, Anterior Labrum, and Superior Labrum Arthroscopic distal clavicle excision - 09811 Arthroscopic subacromial decompression - 91478  SURGEON: Benancio Deeds MD  ASSISTANT: Kerby Less, ATC  ANESTHESIA:  general  IV FLUIDS AND URINE: See anesthesia record.  ANTIBIOTICS: Ancef  ESTIMATED BLOOD LOSS: 5 mL.  IMPLANTS:  * No implants in log *  DRAINS: None  CULTURES: None  COMPLICATIONS: none  PROCEDURE:    OPERATIVE FINDING: Exam under anesthesia:   Examination under anesthesia revealed forward elevation of 150 degrees.  With the arm at the side, there was 65 degrees of external rotation.  There is a 1+ anterior load shift and a 1+ posterior load shift.    Arthroscopic findings demonstrated: Articular space: Rotator interval normal Chondral surfaces: Normal Biceps: Intact/normal Subscapularis: Intact Supraspinatus: undersurface fraying Infraspinatus: Intact    I identified the patient in the pre-operative holding area.  I marked the operative right shoulder with my initials. I reviewed the risks and benefits of the proposed surgical intervention and the patient wished to proceed.  Anesthesia was then performed with regional block.  The patient was transferred to the operative suite and placed in the beach chair position with all bony prominences padded.     SCDs were placed on bilateral lower extremity. Appropriate antibiotics was administered within 1 hour before incision.  Anesthesia was induced.  The operative extremity was  then prepped and draped in standard fashion. A time out was performed confirming the correct extremity, correct patient and correct procedure.   The arthroscope was introduced in the glenohumeral joint from a posterior portal.  An anterior portal was created.  The shoulder was examined and the above findings were noted.     With an arthroscopic shaver and a wand ablator, synovitis throughout the  shoulder was resected.  The arthroscopic shaver was used to excise torn portions of the labrum back to a stable margin. Specifically this was done for the anterior and superior labrum. The rotator interval was debrided using shaver and electrocautery.   The rotator cuff was then approached through the subacromial space. Anterior, lateral, and posterior portals were used.  Bursectomy was performed with an arthroscopic shaver and [employer] wand.  The soft tissues and 1 mm of bone on the undersurface of the acromion and clavicle were resected with the arthroscopic shaver and wand. The introducing the shaver anteriorly, the distal aspect of the clavicle was debrided the width of the shaver.  The shoulder was irrigated.  The arthroscopic instruments were removed.  Wounds were closed with 3-0 nylon sutures.  A sterile dressing was applied with xeroform, 4x8s, abdominal pad, and tape. An Flonnie Hailstone was placed and the upper extremity was placed in a shoulder immobilizer.  The patient tolerated the procedure well and was taken to the recovery room in stable condition.  All counts were correct in the case. The patient tolerated the procedure well and was taken to the recovery room in stable condition.    POSTOPERATIVE PLAN: She will be weight bearing and activity as tolerated when her nerve block wears off. She will be placed on aspirin for blood clot prevention. She will be seen as  needed by PT.  Benancio Deeds, MD 2:56 PM

## 2024-01-22 NOTE — Progress Notes (Signed)
Assisted Dr. Foster with right, interscalene , ultrasound guided block. Side rails up, monitors on throughout procedure. See vital signs in flow sheet. Tolerated Procedure well. 

## 2024-01-22 NOTE — Discharge Instructions (Addendum)
 Discharge Instructions    Attending Surgeon: [name], MD Office Phone Number: [phone]   Diagnosis and Procedures:    Surgeries Performed: Right shoulder arthroscopy with debridement, acromioplasty  Discharge Plan:    Diet: Resume usual diet. Begin with light or bland foods.  Drink plenty of fluids.  Activity:  You may discontinue your sling once your nerve block is worn off You are advised to go home directly from the hospital or surgical center. Restrict your activities.  GENERAL INSTRUCTIONS: 1.  Keep your surgical site elevated above your heart for at least 5-7 days or longer to prevent swelling. This will improve your comfort and your overall recovery following surgery.     2. Please call Dr. Serena Croissant office at [phone] with questions Monday-Friday during business hours. If no one answers, please leave a message and someone should get back to the patient within 24 hours. For emergencies please call 911 or proceed to the emergency room.   3. Patient to notify surgical team if experiences any of the following: Bowel/Bladder dysfunction, uncontrolled pain, nerve/muscle weakness, incision with increased drainage or redness, nausea/vomiting and Fever greater than 101.0 F.  Be alert for signs of infection including redness, streaking, odor, fever or chills. Be alert for excessive pain or bleeding and notify your surgeon immediately.  WOUND INSTRUCTIONS:   Leave your dressing/cast/splint in place until your post operative visit.  Keep it clean and dry.  Always keep the incision clean and dry until the staples/sutures are removed. If there is no drainage from the incision you should keep it open to air. If there is drainage from the incision you must keep it covered at all times until the drainage stops  Do not soak in a bath tub, hot tub, pool, lake or other body of water until 21 days after your surgery and your incision is completely dry and healed.  If you  have removable sutures (or staples) they must be removed 10-14 days (unless otherwise instructed) from the day of your surgery.     1)  Elevate the extremity as much as possible.  2)  Keep the dressing clean and dry.  3)  Please call us if the dressing becomes wet or dirty.  4)  If you are experiencing worsening pain or worsening swelling, please call.     MEDICATIONS: Resume all previous home medications at the previous prescribed dose and frequency unless otherwise noted Start taking the  pain medications on an as-needed basis as prescribed  Please taper down pain medication over the next week following surgery.  Ideally you should not require a refill of any narcotic pain medication.  Take pain medication with food to minimize nausea. In addition to the prescribed pain medication, you may take over-the-counter pain relievers such as Tylenol.  Do NOT take additional tylenol if your pain medication already has tylenol in it. No Tylenol until after 6:30pm today. Aspirin 325mg  daily per bottle instructions. Narcotic Policy: Per [facility] clinic policy, our goal is ensure optimal postoperative pain control with a multimodal pain management strategy. For all [facility] patients, our goal is to wean post-operative narcotic medications by 6 weeks post-operatively, and many times sooner. If this is not possible due to utilization of pain medication prior to surgery, your [facility] doctor will support your acute post-operative pain control for the first 6 weeks postoperatively, with a plan to transition you back to your primary pain team following that. Cyndia Skeeters will work to ensure a Therapist, occupational.  FOLLOWUP INSTRUCTIONS: 1. Follow up at the Physical Therapy Clinic 3-4 days following surgery. This appointment should be scheduled unless other arrangements have been made.The Physical Therapy scheduling number is [phone] if an appointment has not already been arranged.  2. Contact Dr.  Serena Croissant office during office hours at [phone] or the practice after hours line at [phone] for non-emergencies. For medical emergencies call 911.   Discharge Location: Home   Post Anesthesia Home Care Instructions  Activity: Get plenty of rest for the remainder of the day. A responsible individual must stay with you for 24 hours following the procedure.  For the next 24 hours, DO NOT: -Drive a car -Advertising copywriter -Drink alcoholic beverages -Take any medication unless instructed by your physician -Make any legal decisions or sign important papers.  Meals: Start with liquid foods such as gelatin or soup. Progress to regular foods as tolerated. Avoid greasy, spicy, heavy foods. If nausea and/or vomiting occur, drink only clear liquids until the nausea and/or vomiting subsides. Call your physician if vomiting continues.  Special Instructions/Symptoms: Your throat may feel dry or sore from the anesthesia or the breathing tube placed in your throat during surgery. If this causes discomfort, gargle with warm salt water. The discomfort should disappear within 24 hours.  If you had a scopolamine patch placed behind your ear for the management of post- operative nausea and/or vomiting:  1. The medication in the patch is effective for 72 hours, after which it should be removed.  Wrap patch in a tissue and discard in the trash. Wash hands thoroughly with soap and water. 2. You may remove the patch earlier than 72 hours if you experience unpleasant side effects which may include dry mouth, dizziness or visual disturbances. 3. Avoid touching the patch. Wash your hands with soap and water after contact with the patch.   Regional Anesthesia Blocks  1. You may not be able to move or feel the "blocked" extremity after a regional anesthetic block. This may last may last from 3-48 hours after placement, but it will go away. The length of time depends on the medication injected and your  individual response to the medication. As the nerves start to wake up, you may experience tingling as the movement and feeling returns to your extremity. If the numbness and inability to move your extremity has not gone away after 48 hours, please call your surgeon.   2. The extremity that is blocked will need to be protected until the numbness is gone and the strength has returned. Because you cannot feel it, you will need to take extra care to avoid injury. Because it may be weak, you may have difficulty moving it or using it. You may not know what position it is in without looking at it while the block is in effect.  3. For blocks in the legs and feet, returning to weight bearing and walking needs to be done carefully. You will need to wait until the numbness is entirely gone and the strength has returned. You should be able to move your leg and foot normally before you try and bear weight or walk. You will need someone to be with you when you first try to ensure you do not fall and possibly risk injury.  4. Bruising and tenderness at the needle site are common side effects and will resolve in a few days.  5. Persistent numbness or new problems with movement should be communicated to the surgeon or the [facility] [phone] [facility] [phone]).Information for Discharge Teaching: EXPAREL (bupivacaine liposome injectable suspension)   Pain relief is important to your recovery. The goal is to control your pain so you can move easier and return to your normal activities as soon as possible after your procedure. Your physician may use several types of medicines to manage pain, swelling, and more.  Your surgeon or anesthesiologist gave you EXPAREL(bupivacaine) to help control your pain after surgery.  EXPAREL is a local anesthetic designed to release slowly over an extended period of time to provide pain relief by numbing the tissue around the surgical site. EXPAREL  is designed to release pain medication over time and can control pain for up to 72 hours. Depending on how you respond to EXPAREL, you may require less pain medication during your recovery. EXPAREL can help reduce or eliminate the need for opioids during the first few days after surgery when pain relief is needed the most. EXPAREL is not an opioid and is not addictive. It does not cause sleepiness or sedation.   Important! A teal colored band has been placed on your arm with the date, time and amount of EXPAREL you have received. Please leave this armband in place for the full 96 hours following administration, and then you may remove the band. If you return to the hospital for any reason within 96 hours following the administration of EXPAREL, the armband provides important information that your health care providers to know, and alerts them that you have received this anesthetic.    Possible side effects of EXPAREL: Temporary loss of sensation or ability to move in the area where medication was injected. Nausea, vomiting, constipation Rarely, numbness and tingling in your mouth or lips, lightheadedness, or anxiety may occur. Call your doctor right away if you think you may be experiencing any of these sensations, or if you have other questions regarding possible side effects.  Follow all other discharge instructions given to you by your surgeon or nurse. Eat a healthy diet and drink plenty of water or other fluids.   Oxycodone 5mg  given today at 4:06 PM

## 2024-01-22 NOTE — Anesthesia Procedure Notes (Signed)
 Procedure Name: Intubation Date/Time: 01/22/2024 2:26 PM  Performed by: Roosvelt Harps, CRNAPre-anesthesia Checklist: Patient identified, Emergency Drugs available, Suction available and Patient being monitored Patient Re-evaluated:Patient Re-evaluated prior to induction Oxygen Delivery Method: Circle System Utilized Preoxygenation: Pre-oxygenation with 100% oxygen Induction Type: IV induction Ventilation: Mask ventilation without difficulty Laryngoscope Size: Mac and 3 Grade View: Grade I Tube type: Oral Tube size: 7.0 mm Number of attempts: 1 Airway Equipment and Method: Stylet and Oral airway Placement Confirmation: ETT inserted through vocal cords under direct vision, positive ETCO2 and breath sounds checked- equal and bilateral Secured at: 21 cm Tube secured with: Tape Dental Injury: Teeth and Oropharynx as per pre-operative assessment

## 2024-01-22 NOTE — Anesthesia Procedure Notes (Signed)
 Anesthesia Regional Block: Interscalene brachial plexus block   Pre-Anesthetic Checklist: , timeout performed,  Correct Patient, Correct Site, Correct Laterality,  Correct Procedure, Correct Position, site marked,  Risks and benefits discussed,  Surgical consent,  Pre-op evaluation,  At surgeon's request and post-op pain management  Laterality: Right  Prep: chloraprep       Needles:  Injection technique: Single-shot  Needle Type: Echogenic Stimulator Needle     Needle Length: 10cm  Needle Gauge: 21   Needle insertion depth: 6 cm   Additional Needles:   Procedures:,,,, ultrasound used (permanent image in chart),,   Motor weakness within 5 minutes.  Narrative:  Start time: 01/22/2024 1:52 PM End time: 01/22/2024 1:57 PM  Performed by: Personally  Anesthesiologist: Mal Amabile, MD  Additional Notes: Timeout performed. Patient sedated. Relevant anatomy ID'd using [place]. Incremental 2-5ml injection of LA with frequent aspiration. Patient tolerated procedure well.

## 2024-01-22 NOTE — Anesthesia Preprocedure Evaluation (Addendum)
 Anesthesia Evaluation  Patient identified by MRN, date of birth, ID band Patient awake    Reviewed: Allergy & Precautions, NPO status , Patient's Chart, lab work & pertinent test results  History of Anesthesia Complications (+) PONV, POST - OP SPINAL HEADACHE and history of anesthetic complications  Airway Mallampati: II  TM Distance: >3 FB     Dental no notable dental hx. 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 Left ventricular ejection fraction, by estimation, is 50 to 55%. The  left ventricle has low normal function. The left ventricle has no regional  wall motion abnormalities. Left ventricular diastolic parameters were  normal. The average left ventricular   global longitudinal strain is -18.0 %. The global longitudinal strain is  normal.   2. Right ventricular systolic function is normal. The right ventricular  size is normal. Tricuspid regurgitation signal is inadequate for assessing  PA pressure.   3. The mitral valve is grossly normal. No evidence of mitral valve  regurgitation.   4. The aortic valve is tricuspid. Aortic valve regurgitation is not  visualized.   5. Aortic no significant aortic root/ascending aortic aneurysm.   6. The inferior vena cava is normal in size with greater than 50%  respiratory variability, suggesting right atrial pressure of 3 mmHg.   EKG 11/13/23 NSR, non specific ST-T abnormality   Neuro/Psych  Headaches PSYCHIATRIC DISORDERS Anxiety     PTSD Neuromuscular disease    GI/Hepatic Neg liver ROS,GERD  Medicated,,  Endo/Other  Obesity  Renal/GU negative Renal ROS  negative genitourinary    Musculoskeletal  (+) Arthritis , Osteoarthritis,  Impingement syndrome right shoulder Hx/o cervical radiculopathy DDD lumbar spine   Abdominal  (+) + obese  Peds  Hematology  (+) Blood dyscrasia, anemia   Anesthesia Other Findings   Reproductive/Obstetrics                              Anesthesia Physical Anesthesia Plan  ASA: 2  Anesthesia Plan: General   Post-op Pain Management: Regional block* and Minimal or no pain anticipated   Induction: Intravenous  PONV Risk Score and Plan: 4 or greater and Treatment may vary due to age or medical condition, Ondansetron and Dexamethasone  Airway Management Planned: Oral ETT  Additional Equipment: None  Intra-op Plan:   Post-operative Plan: Extubation in OR  Informed Consent: I have reviewed the patients History and Physical, chart, labs and discussed the procedure including the risks, benefits and alternatives for the proposed anesthesia with the patient or authorized representative who has indicated his/her understanding and acceptance.     Dental advisory given  Plan Discussed with: Anesthesiologist and CRNA  Anesthesia Plan Comments:          Anesthesia Quick Evaluation

## 2024-01-22 NOTE — Anesthesia Postprocedure Evaluation (Signed)
 Anesthesia Post Note  Patient: Meghan Reed  Procedure(s) Performed: RIGHT SHOULDER ARTHROSCOPY WITH DISTAL CLAVICLE RESECTION / ACROMIOPLASTY/ BURSECTOMY (Right: Shoulder) SHOULDER ACROMIOPLASTY/BURSECTOMY (Right: Shoulder)     Patient location during evaluation: PACU Anesthesia Type: General Level of consciousness: awake and alert and oriented Pain management: pain level controlled Vital Signs Assessment: post-procedure vital signs reviewed and stable Respiratory status: spontaneous breathing, nonlabored ventilation and respiratory function stable Cardiovascular status: blood pressure returned to baseline and stable Postop Assessment: no apparent nausea or vomiting Anesthetic complications: no   No notable events documented.  Last Vitals:  Vitals:   01/22/24 1530 01/22/24 1545  BP: 108/75 111/80  Pulse: 81 76  Resp: (!) 21 19  Temp:    SpO2: 97% 97%    Last Pain:  Vitals:   01/22/24 1545  TempSrc:   PainSc: 0-No pain                 Abrie Egloff A.

## 2024-01-22 NOTE — Brief Op Note (Signed)
   Brief Op Note  Date of Surgery: 01/22/2024  Preoperative Diagnosis: RIGHT ROTATOR CUFF IMPINGEMENT  Postoperative Diagnosis: same  Procedure: Procedure(s): RIGHT SHOULDER ARTHROSCOPY WITH DISTAL CLAVICLE RESECTION / ACROMIOPLASTY/ BURSECTOMY SHOULDER ACROMIOPLASTY/BURSECTOMY  Implants: * No implants in log *  Surgeons: Surgeon(s): [name], MD  Anesthesia: General    Estimated Blood Loss: See anesthesia record  Complications: None  Condition to PACU: Stable  Benancio Deeds, MD 01/22/2024 2:56 PM

## 2024-01-22 NOTE — Transfer of Care (Signed)
 Immediate Anesthesia Transfer of Care Note  Patient: Meghan Reed  Procedure(s) Performed: RIGHT SHOULDER ARTHROSCOPY WITH DISTAL CLAVICLE RESECTION / ACROMIOPLASTY/ BURSECTOMY (Right: Shoulder) SHOULDER ACROMIOPLASTY/BURSECTOMY (Right: Shoulder)  Patient Location: PACU  Anesthesia Type:General and Regional  Level of Consciousness: awake  Airway & Oxygen Therapy: Patient Spontanous Breathing and Patient connected to face mask oxygen  Post-op Assessment: Report given to RN and Post -op Vital signs reviewed and stable  Post vital signs: Reviewed and stable  Last Vitals:  Vitals Value Taken Time  BP 108/72 01/22/24 1515  Temp    Pulse 78 01/22/24 1516  Resp 16 01/22/24 1516  SpO2 100 % 01/22/24 1516  Vitals shown include unfiled device data.  Last Pain:  Vitals:   01/22/24 1216  TempSrc: Temporal  PainSc: 5       Patients Stated Pain Goal: 5 (01/22/24 1216)  Complications: No notable events documented.

## 2024-01-23 ENCOUNTER — Inpatient Hospital Stay (HOSPITAL_COMMUNITY)
Admission: EM | Admit: 2024-01-23 | Discharge: 2024-01-25 | DRG: 176 | Disposition: A | Discharge status: Home / Self Care | Attending: Internal Medicine | Admitting: Internal Medicine

## 2024-01-23 ENCOUNTER — Emergency Department (HOSPITAL_COMMUNITY)

## 2024-01-23 ENCOUNTER — Encounter (HOSPITAL_BASED_OUTPATIENT_CLINIC_OR_DEPARTMENT_OTHER): Payer: Self-pay | Admitting: Orthopaedic Surgery

## 2024-01-23 ENCOUNTER — Observation Stay (HOSPITAL_BASED_OUTPATIENT_CLINIC_OR_DEPARTMENT_OTHER)

## 2024-01-23 ENCOUNTER — Other Ambulatory Visit: Payer: Self-pay

## 2024-01-23 DIAGNOSIS — I2693 Single subsegmental pulmonary embolism without acute cor pulmonale: Secondary | ICD-10-CM | POA: Diagnosis not present

## 2024-01-23 DIAGNOSIS — Z823 Family history of stroke: Secondary | ICD-10-CM

## 2024-01-23 DIAGNOSIS — Z79899 Other long term (current) drug therapy: Secondary | ICD-10-CM

## 2024-01-23 DIAGNOSIS — R079 Chest pain, unspecified: Secondary | ICD-10-CM

## 2024-01-23 DIAGNOSIS — Z8616 Personal history of COVID-19: Secondary | ICD-10-CM

## 2024-01-23 DIAGNOSIS — Z86711 Personal history of pulmonary embolism: Secondary | ICD-10-CM

## 2024-01-23 DIAGNOSIS — R072 Precordial pain: Secondary | ICD-10-CM

## 2024-01-23 DIAGNOSIS — Z8261 Family history of arthritis: Secondary | ICD-10-CM

## 2024-01-23 DIAGNOSIS — Z83438 Family history of other disorder of lipoprotein metabolism and other lipidemia: Secondary | ICD-10-CM

## 2024-01-23 DIAGNOSIS — Z9889 Other specified postprocedural states: Secondary | ICD-10-CM

## 2024-01-23 DIAGNOSIS — I2699 Other pulmonary embolism without acute cor pulmonale: Principal | ICD-10-CM | POA: Diagnosis present

## 2024-01-23 DIAGNOSIS — Z818 Family history of other mental and behavioral disorders: Secondary | ICD-10-CM

## 2024-01-23 DIAGNOSIS — Z7951 Long term (current) use of inhaled steroids: Secondary | ICD-10-CM

## 2024-01-23 DIAGNOSIS — Z833 Family history of diabetes mellitus: Secondary | ICD-10-CM

## 2024-01-23 DIAGNOSIS — Z6831 Body mass index (BMI) 31.0-31.9, adult: Secondary | ICD-10-CM

## 2024-01-23 DIAGNOSIS — Z90710 Acquired absence of both cervix and uterus: Secondary | ICD-10-CM

## 2024-01-23 DIAGNOSIS — Z800 Family history of malignant neoplasm of digestive organs: Secondary | ICD-10-CM

## 2024-01-23 DIAGNOSIS — E669 Obesity, unspecified: Secondary | ICD-10-CM | POA: Diagnosis present

## 2024-01-23 DIAGNOSIS — E88819 Insulin resistance, unspecified: Secondary | ICD-10-CM | POA: Diagnosis present

## 2024-01-23 DIAGNOSIS — Z886 Allergy status to analgesic agent status: Secondary | ICD-10-CM

## 2024-01-23 DIAGNOSIS — Z87891 Personal history of nicotine dependence: Secondary | ICD-10-CM

## 2024-01-23 DIAGNOSIS — Z9079 Acquired absence of other genital organ(s): Secondary | ICD-10-CM

## 2024-01-23 DIAGNOSIS — Z803 Family history of malignant neoplasm of breast: Secondary | ICD-10-CM

## 2024-01-23 DIAGNOSIS — J45909 Unspecified asthma, uncomplicated: Secondary | ICD-10-CM | POA: Diagnosis present

## 2024-01-23 DIAGNOSIS — Z8249 Family history of ischemic heart disease and other diseases of the circulatory system: Secondary | ICD-10-CM

## 2024-01-23 DIAGNOSIS — Z91018 Allergy to other foods: Secondary | ICD-10-CM

## 2024-01-23 DIAGNOSIS — Z7952 Long term (current) use of systemic steroids: Secondary | ICD-10-CM

## 2024-01-23 DIAGNOSIS — Z808 Family history of malignant neoplasm of other organs or systems: Secondary | ICD-10-CM

## 2024-01-23 DIAGNOSIS — D509 Iron deficiency anemia, unspecified: Secondary | ICD-10-CM | POA: Diagnosis present

## 2024-01-23 DIAGNOSIS — K219 Gastro-esophageal reflux disease without esophagitis: Secondary | ICD-10-CM | POA: Diagnosis present

## 2024-01-23 DIAGNOSIS — Z7982 Long term (current) use of aspirin: Secondary | ICD-10-CM

## 2024-01-23 DIAGNOSIS — Z91048 Other nonmedicinal substance allergy status: Secondary | ICD-10-CM

## 2024-01-23 DIAGNOSIS — Z801 Family history of malignant neoplasm of trachea, bronchus and lung: Secondary | ICD-10-CM

## 2024-01-23 DIAGNOSIS — J9811 Atelectasis: Secondary | ICD-10-CM | POA: Diagnosis present

## 2024-01-23 LAB — ECHOCARDIOGRAM COMPLETE BUBBLE STUDY
AR max vel: 2.25 cm2
AV Area VTI: 2.03 cm2
AV Area mean vel: 2.08 cm2
AV Mean grad: 2 mmHg
AV Peak grad: 3.2 mmHg
Ao pk vel: 0.89 m/s
Area-P 1/2: 2.52 cm2
Calc EF: 51.8 %
MV VTI: 2.17 cm2
S' Lateral: 2.5 cm
Single Plane A2C EF: 49.5 %
Single Plane A4C EF: 50.5 %

## 2024-01-23 LAB — TROPONIN I (HIGH SENSITIVITY)
Troponin I (High Sensitivity): <2 ng/L (ref <18)
Troponin I (High Sensitivity): <2 ng/L (ref <18)

## 2024-01-23 LAB — BASIC METABOLIC PANEL
Anion gap: 12 (ref 5–15)
BUN: 8 mg/dL (ref 6–20)
CO2: 18 mmol/L — ABNORMAL LOW (ref 22–32)
Calcium: 9.6 mg/dL (ref 8.9–10.3)
Chloride: 105 mmol/L (ref 98–111)
Creatinine, Ser: 0.88 mg/dL (ref 0.44–1.00)
GFR, Estimated: >60 mL/min (ref >60)
Glucose, Bld: 195 mg/dL — ABNORMAL HIGH (ref 70–99)
Potassium: 4.2 mmol/L (ref 3.5–5.1)
Sodium: 135 mmol/L (ref 135–145)

## 2024-01-23 LAB — CBC
HCT: 40.2 % (ref 36.0–46.0)
Hemoglobin: 13.7 g/dL (ref 12.0–15.0)
MCH: 30.9 pg (ref 26.0–34.0)
MCHC: 34.1 g/dL (ref 30.0–36.0)
MCV: 90.5 fL (ref 80.0–100.0)
Platelets: 279 10*3/uL (ref 150–400)
RBC: 4.44 MIL/uL (ref 3.87–5.11)
RDW: 12.5 % (ref 11.5–15.5)
WBC: 8.4 10*3/uL (ref 4.0–10.5)
nRBC: 0 % (ref 0.0–0.2)

## 2024-01-23 LAB — HEPARIN LEVEL (UNFRACTIONATED): Heparin Unfractionated: 0.4 [IU]/mL (ref 0.30–0.70)

## 2024-01-23 MED ORDER — SODIUM CHLORIDE 0.9% FLUSH
3.0000 mL | Freq: Two times a day (BID) | INTRAVENOUS | Status: DC
  Administered 2024-01-23 – 2024-01-25 (×5): 3 mL via INTRAVENOUS

## 2024-01-23 MED ORDER — HEPARIN (PORCINE) 25000 UT/250ML-% IV SOLN
1000.0000 [IU]/h | INTRAVENOUS | Status: DC
  Administered 2024-01-23 – 2024-01-24 (×2): 1200 [IU]/h via INTRAVENOUS
  Filled 2024-01-23 (×2): qty 250

## 2024-01-23 MED ORDER — ACETAMINOPHEN 325 MG PO TABS: 650.0000 mg | ORAL_TABLET | Freq: Four times a day (QID) | ORAL | Status: DC | PRN

## 2024-01-23 MED ORDER — ACETAMINOPHEN 650 MG RE SUPP: 650.0000 mg | Freq: Four times a day (QID) | RECTAL | Status: DC | PRN

## 2024-01-23 MED ORDER — PANTOPRAZOLE SODIUM 40 MG IV SOLR
40.0000 mg | INTRAVENOUS | Status: DC
  Administered 2024-01-23 – 2024-01-25 (×3): 40 mg via INTRAVENOUS
  Filled 2024-01-23 (×3): qty 10

## 2024-01-23 MED ORDER — IOHEXOL 350 MG/ML SOLN
75.0000 mL | Freq: Once | INTRAVENOUS | Status: AC | PRN
  Administered 2024-01-23: 75 mL via INTRAVENOUS

## 2024-01-23 MED ORDER — HYDROCODONE-ACETAMINOPHEN 5-325 MG PO TABS
1.0000 | ORAL_TABLET | ORAL | Status: DC | PRN
  Administered 2024-01-23 – 2024-01-25 (×7): 1 via ORAL
  Filled 2024-01-23 (×7): qty 1

## 2024-01-23 MED ORDER — MORPHINE SULFATE (PF) 2 MG/ML IV SOLN
2.0000 mg | INTRAVENOUS | Status: DC | PRN
  Administered 2024-01-23 (×2): 2 mg via INTRAVENOUS
  Filled 2024-01-23 (×2): qty 1

## 2024-01-23 NOTE — Assessment & Plan Note (Signed)
 2/2 to [facility] PE, will get echo with bubble study. Hem onc consult as OPT.

## 2024-01-23 NOTE — Progress Notes (Signed)
  Echocardiogram 2D Echocardiogram has been performed.  Ocie Doyne RDCS 01/23/2024, 3:46 PM

## 2024-01-23 NOTE — ED Provider Notes (Signed)
 [facility]-EMER[facility] DEPT [facility] Emergency Department Provider Note MRN:  [id number]  Arrival date & time: 01/23/24     Chief Complaint   Chest Pain and Shortness of Breath   History of Present Illness   Larose Batres is a 38 y.o. year-old female presents to the ED with chief complaint of left sided chest pain and SOB.  Had shoulder surgery yesterday for subacromial impingement.  States that after getting home and falling asleep, she woke up with left chest pain.  She reports hx of PE, but never found out why.  Not currently anticoagulated.  Denies fever, chills, or cough.  History provided by patient.   Review of Systems  Pertinent positive and negative review of systems noted in HPI.    Physical Exam   Vitals:   01/23/24 0500 01/23/24 0530  BP: 102/73 102/74  Pulse: 70 62  Resp: (!) 25 (!) 25  Temp:    SpO2: 99% 99%    CONSTITUTIONAL:  uncomfortable-appearing, NAD NEURO:  Alert and oriented x 3, CN 3-12 grossly intact EYES:  eyes equal and reactive ENT/NECK:  Supple, no stridor  CARDIO:  normal rate, regular rhythm, appears well-perfused, intact distal pulses PULM:  No respiratory distress, CTAB GI/GU:  non-distended,  MSK/SPINE:  No gross deformities, no edema, moves all extremities  SKIN:  no rash, atraumatic   *Additional and/or pertinent findings included in MDM below  Diagnostic and Interventional Summary    EKG Interpretation Date/Time:    Ventricular Rate:    PR Interval:    QRS Duration:    QT Interval:    QTC Calculation:   R Axis:      Text Interpretation:         Labs Reviewed  BASIC METABOLIC PANEL - Abnormal; Notable for the following components:      Result Value   CO2 18 (*)    Glucose, Bld 195 (*)    All other components within normal limits  CBC  TROPONIN I (HIGH SENSITIVITY)  TROPONIN I (HIGH SENSITIVITY)    CT Angio Chest PE W and/or Wo Contrast  Final Result    DG Chest 2 View  Final Result       Medications  heparin ADULT infusion 100 units/mL (25000 units/25mL) (1,200 Units/hr Intravenous New Bag/Given 01/23/24 0538)  iohexol (OMNIPAQUE) 350 MG/ML injection 75 mL (75 mLs Intravenous Contrast Given 01/23/24 0407)     Procedures  /  Critical Care .Critical Care  Performed by: Roxy Horseman, PA-C Authorized by: Roxy Horseman, PA-C   Critical care provider statement:    Critical care time (minutes):  48   Critical care was necessary to treat or prevent imminent or life-threatening deterioration of the following conditions:  Circulatory failure   Critical care was time spent personally by me on the following activities:  Development of treatment plan with patient or surrogate, discussions with consultants, evaluation of patient's response to treatment, examination of patient, ordering and review of laboratory studies, ordering and review of radiographic studies, ordering and performing treatments and interventions, pulse oximetry, re-evaluation of patient's condition and review of old charts   ED Course and Medical Decision Making  I have reviewed the triage vital signs, the nursing notes, and pertinent available records from the EMR.  Social Determinants Affecting Complexity of Care: Patient has no clinically significant social determinants affecting this chief complaint..   ED Course:    Medical Decision Making Patient here with left sided chest pain after surgery yesterday.  Hx of  PE.  Not anticoagulated.  No fever or cough.  CXR shows atelectasis.  Still having significant discomfort.  Will check PE study.  Received phone call from Dr. Grace Isaac, who informs me that patient has a left segmental PE.    Will start heparin.  Will admit.  Not hypoxic.  No marked hypotension.  Patient doesn't appear in distress.  Amount and/or Complexity of Data Reviewed Labs: ordered. Radiology: ordered and independent interpretation performed.    Details: Left segmental  PE  Risk Prescription drug management. Decision regarding hospitalization.         Consultants: I consulted with Hospitalist, Dr. Margo Aye, who is appreciated for admitting.   Treatment and Plan: Patient's exam and diagnostic results are concerning for PE.  Feel that patient will need admission to the hospital for further treatment and evaluation.  Patient discussed with attending physician, Dr. Wilkie Aye, who agrees with plan.  Final Clinical Impressions(s) / ED Diagnoses     ICD-10-CM   1. Acute pulmonary embolism without acute cor pulmonale, unspecified pulmonary embolism type [facility])  I26.99       ED Discharge Orders     None         Discharge Instructions Discussed with and Provided to Patient:   Discharge Instructions   None      Roxy Horseman, PA-C 01/23/24 1610    Shon Baton, MD 01/23/24 [phone]

## 2024-01-23 NOTE — Plan of Care (Signed)

## 2024-01-23 NOTE — ED Notes (Signed)
 Patient transported to X-ray
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
## 2024-01-23 NOTE — ED Triage Notes (Signed)
 Patient arrived with EMS from home reports persistent left chest pain "pinching" with SOB this morning , right shoulder surgery yesterday , no emesis or diaphoresis , history of PE .
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
## 2024-01-23 NOTE — Assessment & Plan Note (Signed)
 Cont heparin gtt PE protocol.  Eliquis once stable.

## 2024-01-23 NOTE — Assessment & Plan Note (Signed)
 Resolved

## 2024-01-23 NOTE — Assessment & Plan Note (Signed)
 PRN albuterol

## 2024-01-23 NOTE — ED Notes (Addendum)
 Patient transported to CT scan .
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
## 2024-01-23 NOTE — Assessment & Plan Note (Signed)
 Current BMI of 31 will get A1c added on.

## 2024-01-24 ENCOUNTER — Telehealth (HOSPITAL_BASED_OUTPATIENT_CLINIC_OR_DEPARTMENT_OTHER): Payer: Self-pay | Admitting: Orthopaedic Surgery

## 2024-01-24 ENCOUNTER — Observation Stay (HOSPITAL_COMMUNITY)

## 2024-01-24 ENCOUNTER — Other Ambulatory Visit (HOSPITAL_COMMUNITY): Payer: Self-pay

## 2024-01-24 ENCOUNTER — Ambulatory Visit: Payer: Medicaid Other | Admitting: Orthopedic Surgery

## 2024-01-24 DIAGNOSIS — Z808 Family history of malignant neoplasm of other organs or systems: Secondary | ICD-10-CM | POA: Diagnosis not present

## 2024-01-24 DIAGNOSIS — Z9889 Other specified postprocedural states: Secondary | ICD-10-CM | POA: Diagnosis not present

## 2024-01-24 DIAGNOSIS — Z6831 Body mass index (BMI) 31.0-31.9, adult: Secondary | ICD-10-CM | POA: Diagnosis not present

## 2024-01-24 DIAGNOSIS — Z90710 Acquired absence of both cervix and uterus: Secondary | ICD-10-CM | POA: Diagnosis not present

## 2024-01-24 DIAGNOSIS — J45909 Unspecified asthma, uncomplicated: Secondary | ICD-10-CM | POA: Diagnosis present

## 2024-01-24 DIAGNOSIS — I2699 Other pulmonary embolism without acute cor pulmonale: Secondary | ICD-10-CM | POA: Diagnosis present

## 2024-01-24 DIAGNOSIS — Z86711 Personal history of pulmonary embolism: Secondary | ICD-10-CM | POA: Diagnosis not present

## 2024-01-24 DIAGNOSIS — Z8261 Family history of arthritis: Secondary | ICD-10-CM | POA: Diagnosis not present

## 2024-01-24 DIAGNOSIS — Z8616 Personal history of COVID-19: Secondary | ICD-10-CM | POA: Diagnosis not present

## 2024-01-24 DIAGNOSIS — Z79899 Other long term (current) drug therapy: Secondary | ICD-10-CM | POA: Diagnosis not present

## 2024-01-24 DIAGNOSIS — E669 Obesity, unspecified: Secondary | ICD-10-CM | POA: Diagnosis present

## 2024-01-24 DIAGNOSIS — Z91048 Other nonmedicinal substance allergy status: Secondary | ICD-10-CM | POA: Diagnosis not present

## 2024-01-24 DIAGNOSIS — Z833 Family history of diabetes mellitus: Secondary | ICD-10-CM | POA: Diagnosis not present

## 2024-01-24 DIAGNOSIS — Z91018 Allergy to other foods: Secondary | ICD-10-CM | POA: Diagnosis not present

## 2024-01-24 DIAGNOSIS — Z83438 Family history of other disorder of lipoprotein metabolism and other lipidemia: Secondary | ICD-10-CM | POA: Diagnosis not present

## 2024-01-24 DIAGNOSIS — Z8249 Family history of ischemic heart disease and other diseases of the circulatory system: Secondary | ICD-10-CM | POA: Diagnosis not present

## 2024-01-24 DIAGNOSIS — J9811 Atelectasis: Secondary | ICD-10-CM | POA: Diagnosis present

## 2024-01-24 DIAGNOSIS — Z801 Family history of malignant neoplasm of trachea, bronchus and lung: Secondary | ICD-10-CM | POA: Diagnosis not present

## 2024-01-24 DIAGNOSIS — Z87891 Personal history of nicotine dependence: Secondary | ICD-10-CM | POA: Diagnosis not present

## 2024-01-24 DIAGNOSIS — Z818 Family history of other mental and behavioral disorders: Secondary | ICD-10-CM | POA: Diagnosis not present

## 2024-01-24 DIAGNOSIS — E88819 Insulin resistance, unspecified: Secondary | ICD-10-CM | POA: Diagnosis present

## 2024-01-24 DIAGNOSIS — D509 Iron deficiency anemia, unspecified: Secondary | ICD-10-CM | POA: Diagnosis present

## 2024-01-24 DIAGNOSIS — Z823 Family history of stroke: Secondary | ICD-10-CM | POA: Diagnosis not present

## 2024-01-24 DIAGNOSIS — K219 Gastro-esophageal reflux disease without esophagitis: Secondary | ICD-10-CM | POA: Diagnosis present

## 2024-01-24 DIAGNOSIS — Z886 Allergy status to analgesic agent status: Secondary | ICD-10-CM | POA: Diagnosis not present

## 2024-01-24 DIAGNOSIS — Z9079 Acquired absence of other genital organ(s): Secondary | ICD-10-CM | POA: Diagnosis not present

## 2024-01-24 LAB — COMPREHENSIVE METABOLIC PANEL
ALT: 18 U/L (ref 0–44)
AST: 17 U/L (ref 15–41)
Albumin: 3.2 g/dL — ABNORMAL LOW (ref 3.5–5.0)
Alkaline Phosphatase: 30 U/L — ABNORMAL LOW (ref 38–126)
Anion gap: 5 (ref 5–15)
BUN: 11 mg/dL (ref 6–20)
CO2: 24 mmol/L (ref 22–32)
Calcium: 8.7 mg/dL — ABNORMAL LOW (ref 8.9–10.3)
Chloride: 108 mmol/L (ref 98–111)
Creatinine, Ser: 0.9 mg/dL (ref 0.44–1.00)
GFR, Estimated: >60 mL/min (ref >60)
Glucose, Bld: 98 mg/dL (ref 70–99)
Potassium: 3.8 mmol/L (ref 3.5–5.1)
Sodium: 137 mmol/L (ref 135–145)
Total Bilirubin: 0.4 mg/dL (ref 0.0–1.2)
Total Protein: 6 g/dL — ABNORMAL LOW (ref 6.5–8.1)

## 2024-01-24 LAB — CBC
HCT: 36.3 % (ref 36.0–46.0)
Hemoglobin: 12.3 g/dL (ref 12.0–15.0)
MCH: 30.8 pg (ref 26.0–34.0)
MCHC: 33.9 g/dL (ref 30.0–36.0)
MCV: 91 fL (ref 80.0–100.0)
Platelets: 220 10*3/uL (ref 150–400)
RBC: 3.99 MIL/uL (ref 3.87–5.11)
RDW: 12.9 % (ref 11.5–15.5)
WBC: 8.8 10*3/uL (ref 4.0–10.5)
nRBC: 0 % (ref 0.0–0.2)

## 2024-01-24 LAB — HEPARIN LEVEL (UNFRACTIONATED): Heparin Unfractionated: 0.99 [IU]/mL — ABNORMAL HIGH (ref 0.30–0.70)

## 2024-01-24 MED ORDER — APIXABAN 5 MG PO TABS: 5.0000 mg | ORAL_TABLET | Freq: Two times a day (BID) | ORAL | Status: DC

## 2024-01-24 MED ORDER — APIXABAN 5 MG PO TABS
10.0000 mg | ORAL_TABLET | Freq: Two times a day (BID) | ORAL | Status: DC
  Filled 2024-01-24: qty 2

## 2024-01-24 MED ORDER — APIXABAN 5 MG PO TABS
10.0000 mg | ORAL_TABLET | Freq: Two times a day (BID) | ORAL | Status: DC
  Administered 2024-01-24 – 2024-01-25 (×3): 10 mg via ORAL
  Filled 2024-01-24 (×2): qty 2

## 2024-01-24 NOTE — Progress Notes (Signed)
 PHARMACY - ANTICOAGULATION  Pharmacy Consult for Heparin  Indication: PE Brief A/P: Heparin level subtherapeutic Decrease Heparin rate   Allergies  Allergen Reactions   Tomato Anaphylaxis   Aspirin Other (See Comments)    Told to avoid due to history of stomach ulcers   Nsaids Other (See Comments)    Told to avoid due to history of stomach ulcers   Tape Hives    Vital Signs: Temp: 98 F (36.7 C) (03/26 0001) Temp Source: Oral (03/25 2032) BP: 109/60 (03/26 0001) Pulse Rate: 67 (03/26 0001)  Labs: Recent Labs    01/23/24 0210 01/23/24 0415 01/23/24 1319 01/24/24 0438  HGB 13.7  --   --  12.3  HCT 40.2  --   --  36.3  PLT 279  --   --  220  HEPARINUNFRC  --   --  0.40 0.99*  CREATININE 0.88  --   --  0.90  TROPONINIHS <2 <2  --   --     Estimated Creatinine Clearance: 94.7 mL/min (by C-G formula based on SCr of 0.9 mg/dL).  Assessment: 38 y.o. female with PE for heparin  Goal of Therapy:  Heparin level 0.3-0.7 units/ml Monitor platelets by anticoagulation protocol: Yes   Plan:  Decrease Heparin 1000 units/hr Check heparin level in 8 hours.  Geannie Risen, PharmD, BCPS

## 2024-01-24 NOTE — Plan of Care (Signed)
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
## 2024-01-24 NOTE — Progress Notes (Signed)
 PROGRESS NOTE  Meghan Reed UEA:[id number] DOB: 15-Mar-1986 DOA: 01/23/2024 PCP: Alicia Amel, MD  HPI/Recap of past 24 hours: Meghan Reed is a 38 y.o. female with past medical history of allergies to tomato, aspirin, NSAIDs, adhesive tape, history of pulmonary embolism of anticoagulation, history of iron deficiency anemia on iron supplementation presenting 1 day after right shoulder surgery with complaints of pleuritic chest pain on the left side along with shortness of breath.  In the ED, CT angio showed left segmental PE.  Patient admitted for further management.   Today, patient denies any new complaints, still with some pleuritic chest pain, reports some pain from shoulder surgery.   Assessment/Plan: Principal Problem:   Chest pain Active Problems:   Asthma   Iron deficiency anemia   Insulin resistance   Acute pulmonary embolism (HCC)   Pleuritic chest pain 2/2 acute pulmonary embolism History of prior PE CTA chest showed single segmental PE to the left upper lobe Doppler BLE negative for DVT Echo with EF of 50 to 55%, no regional wall motion abnormality, left ventricular diastolic parameters were normal, right ventricular systolic function normal S/p heparin--->PO Eliquis  Supportive care Outpatient follow-up with Dr. Leonides Schanz, hematologist  Right shoulder surgery Had right shoulder surgery on 01/22/2024 Pain management as needed, dressing changes by RN Outpatient follow-up with surgery  Obesity Lifestyle modification advised       Estimated body mass index is 31.31 kg/m as calculated from the following:   Height as of this encounter: 5\' 6"  (1.676 m).   Weight as of this encounter: 88 kg.     Code Status: Full  Family Communication: None at bedside  Disposition Plan: Status is: Inpatient Remains inpatient appropriate because: Level of care      Consultants: None  Procedures: None  Antimicrobials: None  DVT  prophylaxis: Lovenox   Objective: Vitals:   01/23/24 2032 01/24/24 0001 01/24/24 0852 01/24/24 1246  BP: (!) 101/56 109/60 120/63   Pulse: 73 67 72   Resp: 17 14    Temp: 98.3 F (36.8 C) 98 F (36.7 C) 97.6 F (36.4 C)   TempSrc: Oral  Oral   SpO2: 99% 99% 100% 99%  Weight:      Height:        Intake/Output Summary (Last 24 hours) at 01/24/2024 1702 Last data filed at 01/24/2024 1500 Gross per 24 hour  Intake 733.69 ml  Output 0 ml  Net 733.69 ml   Filed Weights   01/23/24 1150  Weight: 88 kg    Exam: General: NAD  Cardiovascular: S1, S2 present Respiratory: CTAB Abdomen: Soft, nontender, nondistended, bowel sounds present Musculoskeletal: No bilateral pedal edema noted, right shoulder with dressing C/D/I Skin: Normal Psychiatry: Normal mood     Data Reviewed: CBC: Recent Labs  Lab 01/23/24 0210 01/24/24 0438  WBC 8.4 8.8  HGB 13.7 12.3  HCT 40.2 36.3  MCV 90.5 91.0  PLT 279 220   Basic Metabolic Panel: Recent Labs  Lab 01/23/24 0210 01/24/24 0438  NA 135 137  K 4.2 3.8  CL 105 108  CO2 18* 24  GLUCOSE 195* 98  BUN 8 11  CREATININE 0.88 0.90  CALCIUM 9.6 8.7*   GFR: Estimated Creatinine Clearance: 94.7 mL/min (by C-G formula based on SCr of 0.9 mg/dL). Liver Function Tests: Recent Labs  Lab 01/24/24 0438  AST 17  ALT 18  ALKPHOS 30*  BILITOT 0.4  PROT 6.0*  ALBUMIN 3.2*   No results for input(s): "  LIPASE", "AMYLASE" in the last 168 hours. No results for input(s): "AMMONIA" in the last 168 hours. Coagulation Profile: No results for input(s): "INR", "PROTIME" in the last 168 hours. Cardiac Enzymes: No results for input(s): "CKTOTAL", "CKMB", "CKMBINDEX", "TROPONINI" in the last 168 hours. BNP (last 3 results) No results for input(s): "PROBNP" in the last 8760 hours. HbA1C: No results for input(s): "HGBA1C" in the last 72 hours. CBG: No results for input(s): "GLUCAP" in the last 168 hours. Lipid Profile: No results for  input(s): "CHOL", "HDL", "LDLCALC", "TRIG", "CHOLHDL", "LDLDIRECT" in the last 72 hours. Thyroid Function Tests: No results for input(s): "TSH", "T4TOTAL", "FREET4", "T3FREE", "THYROIDAB" in the last 72 hours. Anemia Panel: No results for input(s): "VITAMINB12", "FOLATE", "FERRITIN", "TIBC", "IRON", "RETICCTPCT" in the last 72 hours. Urine analysis:    Component Value Date/Time   COLORURINE YELLOW 08/28/2023 1327   APPEARANCEUR CLEAR 08/28/2023 1327   APPEARANCEUR Clear 08/12/2019 1122   LABSPEC 1.031 (H) 08/28/2023 1327   PHURINE 6.5 08/28/2023 1327   GLUCOSEU NEGATIVE 08/28/2023 1327   HGBUR SMALL (A) 08/28/2023 1327   BILIRUBINUR negative 10/22/2023 1107   BILIRUBINUR negative 03/31/2023 1149   BILIRUBINUR Negative 08/12/2019 1122   KETONESUR negative 10/22/2023 1107   KETONESUR NEGATIVE 08/28/2023 1327   PROTEINUR negative 10/22/2023 1107   PROTEINUR TRACE (A) 08/28/2023 1327   UROBILINOGEN 0.2 10/22/2023 1107   UROBILINOGEN 0.2 05/31/2021 1347   NITRITE Negative 10/22/2023 1107   NITRITE NEGATIVE 08/28/2023 1327   LEUKOCYTESUR Negative 10/22/2023 1107   LEUKOCYTESUR NEGATIVE 08/28/2023 1327   Sepsis Labs: @LABRCNTIP (procalcitonin:4,lacticidven:4)  )No results found for this or any previous visit (from the past 240 hours).    Studies: VAS [place] LOWER EXTREMITY VENOUS (DVT) Result Date: 01/24/2024  Lower Venous DVT Study Patient Name:  Meghan Reed  Date of Exam:   01/24/2024 Medical Rec #: [id number]                       Accession #:    [id number] Date of Birth: 03/28/1986                       Patient Gender: F Patient Age:   51 years Exam Location:  [facility] Procedure:      VAS [place] LOWER EXTREMITY VENOUS (DVT) Referring Phys: [facility] Sharalee Witman --------------------------------------------------------------------------------  Indications: Pulmonary embolism.  Comparison Study: Previous study on 4.16.2024. Performing Technologist: Fernande Bras   Examination Guidelines: A complete evaluation includes B-mode imaging, spectral Doppler, color Doppler, and power Doppler as needed of all accessible portions of each vessel. Bilateral testing is considered an integral part of a complete examination. Limited examinations for reoccurring indications may be performed as noted. The reflux portion of the exam is performed with the patient in reverse Trendelenburg.  +---------+---------------+---------+-----------+----------+--------------+ RIGHT    CompressibilityPhasicitySpontaneityPropertiesThrombus Aging +---------+---------------+---------+-----------+----------+--------------+ CFV      Full           Yes      Yes                                 +---------+---------------+---------+-----------+----------+--------------+ SFJ      Full           Yes      Yes                                 +---------+---------------+---------+-----------+----------+--------------+  FV Prox  Full                                                        +---------+---------------+---------+-----------+----------+--------------+ FV Mid   Full                                                        +---------+---------------+---------+-----------+----------+--------------+ FV DistalFull                                                        +---------+---------------+---------+-----------+----------+--------------+ PFV      Full                                                        +---------+---------------+---------+-----------+----------+--------------+ POP      Full           Yes      Yes                                 +---------+---------------+---------+-----------+----------+--------------+ PTV      Full                                                        +---------+---------------+---------+-----------+----------+--------------+ PERO     Full                                                         +---------+---------------+---------+-----------+----------+--------------+   +---------+---------------+---------+-----------+----------+--------------+ LEFT     CompressibilityPhasicitySpontaneityPropertiesThrombus Aging +---------+---------------+---------+-----------+----------+--------------+ CFV      Full           Yes      Yes                                 +---------+---------------+---------+-----------+----------+--------------+ SFJ      Full           Yes      Yes                                 +---------+---------------+---------+-----------+----------+--------------+ FV Prox  Full                                                        +---------+---------------+---------+-----------+----------+--------------+  FV Mid   Full                                                        +---------+---------------+---------+-----------+----------+--------------+ FV DistalFull                                                        +---------+---------------+---------+-----------+----------+--------------+ PFV      Full                                                        +---------+---------------+---------+-----------+----------+--------------+ POP      Full           Yes      Yes                                 +---------+---------------+---------+-----------+----------+--------------+ PTV      Full                                                        +---------+---------------+---------+-----------+----------+--------------+ PERO     Full                                                        +---------+---------------+---------+-----------+----------+--------------+    Summary: BILATERAL: - No evidence of deep vein thrombosis seen in the lower extremities, bilaterally. -No evidence of popliteal cyst, bilaterally.   *See table(s) above for measurements and observations.    Preliminary     Scheduled Meds:  apixaban  10 mg Oral BID    Followed by   Melene Muller ON 01/31/2024] apixaban  5 mg Oral BID   pantoprazole (PROTONIX) IV  40 mg Intravenous Q24H   sodium chloride flush  3 mL Intravenous Q12H    Continuous Infusions:   LOS: 0 days     Briant Cedar, MD [facility]  If 7PM-7AM, please contact night-coverage [email or web address] 01/24/2024, 5:02 PM

## 2024-01-24 NOTE — Telephone Encounter (Signed)
**Note De-identified  [place] Obfuscation** Please advise 

## 2024-01-24 NOTE — Telephone Encounter (Signed)
 Patient states that she is still in the hospital but needs to know when to removed bandages and has other questions about the aftercare. Please contact patient by phone [phone]

## 2024-01-24 NOTE — Discharge Instructions (Signed)
Information on my medicine - ELIQUIS (apixaban)  Why was Eliquis prescribed for you? Eliquis was prescribed to treat blood clots that may have been found in the veins of your legs (deep vein thrombosis) or in your lungs (pulmonary embolism) and to reduce the risk of them occurring again.  What do You need to know about Eliquis ? The starting dose is 10 mg (two 5 mg tablets) taken TWICE daily for the FIRST SEVEN (7) DAYS, then  the dose is reduced to ONE 5 mg tablet taken TWICE daily.  Eliquis may be taken with or without food.   Try to take the dose about the same time in the morning and in the evening. If you have difficulty swallowing the tablet whole please discuss with your pharmacist how to take the medication safely.  Take Eliquis exactly as prescribed and DO NOT stop taking Eliquis without talking to the doctor who prescribed the medication.  Stopping may increase your risk of developing a new blood clot.  Refill your prescription before you run out.  After discharge, you should have regular check-up appointments with your healthcare provider that is prescribing your Eliquis.    What do you do if you miss a dose? If a dose of ELIQUIS is not taken at the scheduled time, take it as soon as possible on the same day and twice-daily administration should be resumed. The dose should not be doubled to make up for a missed dose.  Important Safety Information A possible side effect of Eliquis is bleeding. You should call your healthcare provider right away if you experience any of the following: Bleeding from an injury or your nose that does not stop. Unusual colored urine (red or dark brown) or unusual colored stools (red or black). Unusual bruising for unknown reasons. A serious fall or if you hit your head (even if there is no bleeding).  Some medicines may interact with Eliquis and might increase your risk of bleeding or clotting while on Eliquis. To help avoid this, consult  your healthcare provider or pharmacist prior to using any new prescription or non-prescription medications, including herbals, vitamins, non-steroidal anti-inflammatory drugs (NSAIDs) and supplements.  This website has more information on Eliquis (apixaban): http://www.eliquis.com/eliquis/home

## 2024-01-24 NOTE — TOC CM/SW Note (Signed)
 Transition of Care [facility]) - Inpatient Brief Assessment   Patient Details  Name: Meghan Reed MRN: [id number] Date of Birth: 27-May-1986  [facility] CM/SW Contact:    Tom-Johnson, Hershal Coria, RN Phone Number: 01/24/2024, 4:41 PM   Clinical Narrative:  Patient presented to the ED with c/o Lt Chest pain with Shortness of Breath. Patient underwent Rt Shoulder the day prior admit on 01/22/24. Heparin gtt transitioned to Eliquis.   From home with husband and four children. Both parents and brother supportive. Employed, independent with care and drive self prior to admit. Does not have DME's at home.  PCP is Alicia Amel, MD and uses [employer].   No TOC needs or recommendations noted at this time.  Patient not Medically ready for discharge.  CM will continue to follow as patient progresses with care towards discharge.              Transition of Care Asessment: Insurance and Status: Insurance coverage has been reviewed Patient has primary care physician: Yes Home environment has been reviewed: Yes Prior level of function:: Independent Prior/Current Home Services: No current home services Social Drivers of Health Review: SDOH reviewed no interventions necessary Readmission risk has been reviewed: Yes Transition of care needs: no transition of care needs at this time

## 2024-01-24 NOTE — TOC Benefit Eligibility Note (Signed)
 Pharmacy Patient Advocate Encounter  Insurance verification completed.   The patient is insured through [employer]   Ran test claim for [employer]. Currently a quantity of 74 is a 30 day supply and the co-pay is $4.00 .  This test claim was processed through [facility]- copay amounts may vary at other pharmacies due to pharmacy/plan contracts, or as the patient moves through the different stages of their insurance plan.

## 2024-01-25 ENCOUNTER — Encounter: Payer: Self-pay | Admitting: Hematology and Oncology

## 2024-01-25 ENCOUNTER — Other Ambulatory Visit (HOSPITAL_COMMUNITY): Payer: Self-pay

## 2024-01-25 ENCOUNTER — Telehealth: Payer: Self-pay | Admitting: Hematology and Oncology

## 2024-01-25 DIAGNOSIS — I2699 Other pulmonary embolism without acute cor pulmonale: Secondary | ICD-10-CM | POA: Diagnosis not present

## 2024-01-25 LAB — HEMOGLOBIN A1C
Hgb A1c MFr Bld: 4.7 % — ABNORMAL LOW (ref 4.8–5.6)
Mean Plasma Glucose: 88.19 mg/dL

## 2024-01-25 LAB — CBC
HCT: 36.4 % (ref 36.0–46.0)
Hemoglobin: 12.4 g/dL (ref 12.0–15.0)
MCH: 30.8 pg (ref 26.0–34.0)
MCHC: 34.1 g/dL (ref 30.0–36.0)
MCV: 90.5 fL (ref 80.0–100.0)
Platelets: 178 10*3/uL (ref 150–400)
RBC: 4.02 MIL/uL (ref 3.87–5.11)
RDW: 12.7 % (ref 11.5–15.5)
WBC: 5.9 10*3/uL (ref 4.0–10.5)
nRBC: 0.3 % — ABNORMAL HIGH (ref 0.0–0.2)

## 2024-01-25 LAB — BASIC METABOLIC PANEL WITH GFR
Anion gap: 5 (ref 5–15)
BUN: 12 mg/dL (ref 6–20)
CO2: 25 mmol/L (ref 22–32)
Calcium: 8.4 mg/dL — ABNORMAL LOW (ref 8.9–10.3)
Chloride: 107 mmol/L (ref 98–111)
Creatinine, Ser: 0.98 mg/dL (ref 0.44–1.00)
GFR, Estimated: >60 mL/min (ref >60)
Glucose, Bld: 92 mg/dL (ref 70–99)
Potassium: 3.8 mmol/L (ref 3.5–5.1)
Sodium: 137 mmol/L (ref 135–145)

## 2024-01-25 MED ORDER — APIXABAN (ELIQUIS) VTE STARTER PACK (10MG AND 5MG)
ORAL_TABLET | ORAL | 0 refills | Status: DC
  Filled 2024-01-25: qty 74, 30d supply, fill #0

## 2024-01-25 NOTE — Telephone Encounter (Signed)
 Patient scheduled follow up appointment from being in the hospital, I spoke with Nurse on duty and she confirmed to schedule as an EST. PT.

## 2024-01-25 NOTE — TOC Transition Note (Addendum)
 Transition of Care [facility]) - Discharge Note   Patient Details  Name: Meghan Reed MRN: [id number] Date of Birth: 09-Dec-1985  Transition of Care [facility]) CM/SW Contact:  Tom-Johnson, Hershal Coria, RN Phone Number: 01/25/2024, 11:25 AM   Clinical Narrative:     Patient is scheduled for discharge today.  Readmission Risk Assessment done. Outpatient f/u, hospital f/u and discharge instructions on AVS. Prescriptions sent to [facility] pharmacy and patient will receive meds prior discharge. No TOC needs or recommendations noted. Ivar Bury to transport at discharge.  No further TOC needs noted.       Final next level of care: Home/Self Care Barriers to Discharge: Barriers Resolved   Patient Goals and CMS Choice Patient states their goals for this hospitalization and ongoing recovery are:: To return home CMS Medicare.gov Compare Post Acute Care list provided to:: Patient Choice offered to / list presented to : NA      Discharge Placement                Patient to be transferred to facility by: Husband Name of family member notified: [facility] Additional resources added to the After Visit Summary for                  DME Arranged: N/A DME Agency: NA       HH Arranged: NA HH Agency: NA        Social Drivers of Health (SDOH) Interventions SDOH Screenings   Food Insecurity: No Food Insecurity (01/23/2024)  Housing: Low Risk  (01/23/2024)  Transportation Needs: No Transportation Needs (01/23/2024)  Utilities: Not At Risk (01/23/2024)  Alcohol Screen: Low Risk  (07/04/2023)  Depression (PHQ2-9): Low Risk  (01/18/2024)  Financial Resource Strain: Low Risk  (07/04/2023)  Physical Activity: Insufficiently Active (07/04/2023)  Social Connections: Moderately Isolated (07/04/2023)  Stress: Stress Concern Present (07/04/2023)  Tobacco Use: Medium Risk (01/23/2024)     Readmission Risk Interventions    01/24/2024    4:41 PM   Readmission Risk Prevention Plan  Post Dischage Appt Complete  Medication Screening Complete  Transportation Screening Complete

## 2024-01-25 NOTE — Plan of Care (Signed)

## 2024-01-25 NOTE — Discharge Summary (Signed)
 Physician Discharge Summary   Patient: Francely Craw MRN: [id number] DOB: 1985/11/27  Admit date:     01/23/2024  Discharge date: 01/25/24  Discharge Physician: Briant Cedar   PCP: Alicia Amel, MD   Recommendations at discharge:   Follow up with PCP in 1 week Follow up with Hematology  Discharge Diagnoses: Principal Problem:   Chest pain Active Problems:   Asthma   Iron deficiency anemia   Insulin resistance   Acute pulmonary embolism [facility])    Hospital Course: Nihal Doan is a 38 y.o. female with past medical history of allergies to tomato, aspirin, NSAIDs, adhesive tape, history of pulmonary embolism of anticoagulation, history of iron deficiency anemia on iron supplementation presenting 1 day after right shoulder surgery with complaints of pleuritic chest pain on the left side along with shortness of breath.  In the ED, CT angio showed left segmental PE.  Patient admitted for further management.    Today, pt denies any new complaints, ambulating without any issues. Advised patient to follow up with PCP as well as Hematology for anticoagulation duration. Advised to hold ASA while on DOAC.    Assessment and Plan:  Pleuritic chest pain 2/2 acute pulmonary embolism History of prior PE CTA chest showed single segmental PE to the left upper lobe Doppler BLE negative for DVT Echo with EF of 50 to 55%, no regional wall motion abnormality, left ventricular diastolic parameters were normal, right ventricular systolic function normal S/p heparin--->PO Eliquis  Supportive care Outpatient follow-up with Dr. Leonides Schanz, hematologist, to determine duration of anticoagulation given history Follow up with PCP   Right shoulder surgery Had right shoulder surgery on 01/22/2024 Pain management as needed, dressing changes by RN Outpatient follow-up with surgery   Obesity Lifestyle modification advised     Pain control - [facility]  Controlled Substance Reporting System database was reviewed. and patient was instructed, not to drive, operate heavy machinery, perform activities at heights, swimming or participation in water activities or provide baby-sitting services while on Pain, Sleep and Anxiety Medications; until their outpatient Physician has advised to do so again. Also recommended to not to take more than prescribed Pain, Sleep and Anxiety Medications.    Consultants: None Procedures performed: None Disposition: Home Diet recommendation:  Regular diet DISCHARGE MEDICATION: Allergies as of 01/25/2024       Reactions   Tomato Anaphylaxis   Aspirin Other (See Comments)   Told to avoid due to history of stomach ulcers   Nsaids Other (See Comments)   Told to avoid due to history of stomach ulcers   Tape Hives        Medication List     PAUSE taking these medications    aspirin EC 325 MG tablet Wait to take this until your doctor or other care provider tells you to start again. Take 1 tablet (325 mg total) by mouth daily.       TAKE these medications    albuterol (2.5 MG/3ML) 0.083% nebulizer solution Commonly known as: PROVENTIL Take 3 mLs (2.5 mg total) by nebulization every 6 (six) hours as needed for wheezing or shortness of breath.   Ventolin HFA 108 (90 Base) MCG/ACT inhaler Generic drug: albuterol Inhale 2 puffs into the lungs every 4 (four) hours as needed for wheezing or shortness of breath.   Apixaban Starter Pack (10mg  and 5mg ) Commonly known as: ELIQUIS STARTER PACK Take as directed on package: start with two-5mg  tablets twice daily for 7 days. On day 8,  switch to one-5mg  tablet twice daily.   cyclobenzaprine 10 MG tablet Commonly known as: FLEXERIL Take 1 tablet (10 mg total) by mouth every 8 (eight) hours as needed for muscle spasms.   desloratadine 5 MG tablet Commonly known as: CLARINEX Take 1 tablet (5 mg total) by mouth daily.   Emgality 120 MG/ML Soaj Generic drug:  Galcanezumab-gnlm Inject 120 mg into the skin every 30 days.   FeroSul 325 (65 Fe) MG tablet Generic drug: ferrous sulfate Take 1 tablet (325 mg total) by mouth daily with breakfast. Please take with a source of Vitamin C   loratadine 10 MG tablet Commonly known as: CLARITIN Take 1 tablet (10 mg total) by mouth daily.   omeprazole 20 MG tablet Commonly known as: PriLOSEC OTC Take 1 tablet (20 mg total) by mouth daily. What changed:  when to take this reasons to take this   oxyCODONE 5 MG immediate release tablet Commonly known as: Roxicodone Take 1 tablet (5 mg total) by mouth every 4 (four) hours as needed for severe pain (pain score 7-10) or breakthrough pain.   predniSONE 20 MG tablet Commonly known as: DELTASONE Take 2 tablets (40 mg total) by mouth daily with breakfast.   Symbicort 160-4.5 MCG/ACT inhaler Generic drug: budesonide-formoterol Inhale 2 puffs into the lungs 2 (two) times daily.   Ubrelvy 100 MG Tabs Generic drug: Ubrogepant Take 1 tablet (100 mg) by mouth daily as needed.        Follow-up Information     Alicia Amel, MD. Schedule an appointment as soon as possible for a visit in 1 week(s).   Specialty: Family Medicine Contact information: [address] [place] [address] [phone]                Discharge Exam: Ceasar Mons Weights   01/23/24 1150 01/25/24 0500  Weight: 88 kg 88 kg   General: NAD  Cardiovascular: S1, S2 present Respiratory: CTAB Abdomen: Soft, nontender, nondistended, bowel sounds present Musculoskeletal: No bilateral pedal edema noted Skin: Normal Psychiatry: Normal mood   Condition at discharge: stable  The results of significant diagnostics from this hospitalization (including imaging, microbiology, ancillary and laboratory) are listed below for reference.   Imaging Studies: VAS [place] LOWER EXTREMITY VENOUS (DVT) Result Date: 01/24/2024  Lower Venous DVT Study Patient Name:  ANAYELY CONSTANTINE  SCRUGGS-HERNANDEZ  Date of Exam:   01/24/2024 Medical Rec #: [id number]                       Accession #:    [id number] Date of Birth: 1986/03/28                       Patient Gender: F Patient Age:   56 years Exam Location:  [facility] Procedure:      VAS [place] LOWER EXTREMITY VENOUS (DVT) Referring Phys: [facility] Menaal Russum --------------------------------------------------------------------------------  Indications: Pulmonary embolism.  Comparison Study: Previous study on 4.16.2024. Performing Technologist: Fernande Bras  Examination Guidelines: A complete evaluation includes B-mode imaging, spectral Doppler, color Doppler, and power Doppler as needed of all accessible portions of each vessel. Bilateral testing is considered an integral part of a complete examination. Limited examinations for reoccurring indications may be performed as noted. The reflux portion of the exam is performed with the patient in reverse Trendelenburg.  +---------+---------------+---------+-----------+----------+--------------+ RIGHT    CompressibilityPhasicitySpontaneityPropertiesThrombus Aging +---------+---------------+---------+-----------+----------+--------------+ CFV      Full  Yes      Yes                                 +---------+---------------+---------+-----------+----------+--------------+ SFJ      Full           Yes      Yes                                 +---------+---------------+---------+-----------+----------+--------------+ FV Prox  Full                                                        +---------+---------------+---------+-----------+----------+--------------+ FV Mid   Full                                                        +---------+---------------+---------+-----------+----------+--------------+ FV DistalFull                                                        +---------+---------------+---------+-----------+----------+--------------+ PFV       Full                                                        +---------+---------------+---------+-----------+----------+--------------+ POP      Full           Yes      Yes                                 +---------+---------------+---------+-----------+----------+--------------+ PTV      Full                                                        +---------+---------------+---------+-----------+----------+--------------+ PERO     Full                                                        +---------+---------------+---------+-----------+----------+--------------+   +---------+---------------+---------+-----------+----------+--------------+ LEFT     CompressibilityPhasicitySpontaneityPropertiesThrombus Aging +---------+---------------+---------+-----------+----------+--------------+ CFV      Full           Yes      Yes                                 +---------+---------------+---------+-----------+----------+--------------+ SFJ      Full  Yes      Yes                                 +---------+---------------+---------+-----------+----------+--------------+ FV Prox  Full                                                        +---------+---------------+---------+-----------+----------+--------------+ FV Mid   Full                                                        +---------+---------------+---------+-----------+----------+--------------+ FV DistalFull                                                        +---------+---------------+---------+-----------+----------+--------------+ PFV      Full                                                        +---------+---------------+---------+-----------+----------+--------------+ POP      Full           Yes      Yes                                 +---------+---------------+---------+-----------+----------+--------------+ PTV      Full                                                         +---------+---------------+---------+-----------+----------+--------------+ PERO     Full                                                        +---------+---------------+---------+-----------+----------+--------------+     Summary: BILATERAL: - No evidence of deep vein thrombosis seen in the lower extremities, bilaterally. -No evidence of popliteal cyst, bilaterally.   *See table(s) above for measurements and observations. Electronically signed by Coral Else MD on 01/24/2024 at 10:04:20 PM.    Final    ECHOCARDIOGRAM COMPLETE BUBBLE STUDY Result Date: 01/23/2024    ECHOCARDIOGRAM REPORT   Patient Name:   [facility] SCRUGGS-HERNANDEZ Date of Exam: 01/23/2024 Medical Rec #:  [id number]                      Height:       66.0 in Accession #:    [id number]  Weight:       194.0 lb Date of Birth:  03-01-1986                      BSA:          1.974 m Patient Age:    37 years                       BP:           120/81 mmHg Patient Gender: F                              HR:           70 bpm. Exam Location:  Inpatient Procedure: 2D Echo, Cardiac Doppler and Color Doppler (Both Spectral and Color            Flow Doppler were utilized during procedure). Indications:    Pulmonary embolism, chest pain  History:        Patient has prior history of Echocardiogram examinations, most                 recent 04/19/2022. Signs/Symptoms:Chest Pain and Murmur; Risk                 Factors:Dyslipidemia.  Sonographer:    Vern Claude Referring Phys: [id number] EKTA V PATEL IMPRESSIONS  1. Left ventricular ejection fraction, by estimation, is 50 to 55%. The left ventricle has low normal function. The left ventricle has no regional wall motion abnormalities. Left ventricular diastolic parameters were normal.  2. Right ventricular systolic function is normal. The right ventricular size is mildly enlarged.  3. The mitral valve is normal in structure. No evidence of mitral valve regurgitation. No  evidence of mitral stenosis.  4. The aortic valve was not well visualized. Aortic valve regurgitation is not visualized. No aortic stenosis is present. Comparison(s): Prior images reviewed side by side. Mild RVOT dilation from prior. FINDINGS  Left Ventricle: Strain and 3D not performed. Left ventricular ejection fraction, by estimation, is 50 to 55%. The left ventricle has low normal function. The left ventricle has no regional wall motion abnormalities. Strain was performed and the global longitudinal strain is indeterminate. The left ventricular internal cavity size was normal in size. There is no left ventricular hypertrophy. Left ventricular diastolic parameters were normal. Right Ventricle: The right ventricular size is mildly enlarged. No increase in right ventricular wall thickness. Right ventricular systolic function is normal. Left Atrium: Left atrial size was normal in size. Right Atrium: Right atrial size was normal in size. Pericardium: There is no evidence of pericardial effusion. Mitral Valve: The mitral valve is normal in structure. No evidence of mitral valve regurgitation. No evidence of mitral valve stenosis. MV peak gradient, 1.7 mmHg. The mean mitral valve gradient is 1.0 mmHg. Tricuspid Valve: The tricuspid valve is normal in structure. Tricuspid valve regurgitation is not demonstrated. No evidence of tricuspid stenosis. Aortic Valve: The aortic valve was not well visualized. Aortic valve regurgitation is not visualized. No aortic stenosis is present. Aortic valve mean gradient measures 2.0 mmHg. Aortic valve peak gradient measures 3.2 mmHg. Aortic valve area, by VTI measures 2.03 cm. Pulmonic Valve: The pulmonic valve was normal in structure. Pulmonic valve regurgitation is not visualized. No evidence of pulmonic stenosis. Aorta: The aortic root, ascending aorta and aortic arch are all structurally normal, with no evidence of dilitation or obstruction. IAS/Shunts:  The atrial septum is grossly  normal. Additional Comments: 3D was performed not requiring image post processing on an independent workstation and was indeterminate.  LEFT VENTRICLE PLAX 2D LVIDd:         4.50 cm      Diastology LVIDs:         2.50 cm      LV e' medial:    11.40 cm/s LV PW:         0.70 cm      LV E/e' medial:  4.9 LV IVS:        0.60 cm      LV e' lateral:   15.00 cm/s LVOT diam:     2.00 cm      LV E/e' lateral: 3.7 LV SV:         38 LV SV Index:   19 LVOT Area:     3.14 cm  LV Volumes (MOD) LV vol d, MOD A2C: 101.0 ml LV vol d, MOD A4C: 129.0 ml LV vol s, MOD A2C: 51.0 ml LV vol s, MOD A4C: 63.8 ml LV SV MOD A2C:     50.0 ml LV SV MOD A4C:     129.0 ml LV SV MOD BP:      60.9 ml RIGHT VENTRICLE            IVC RV Basal diam:  3.20 cm    IVC diam: 1.10 cm RV Mid diam:    2.70 cm RV S prime:     8.60 cm/s LEFT ATRIUM           Index        RIGHT ATRIUM          Index LA diam:      2.90 cm 1.47 cm/m   RA Area:     9.84 cm LA Vol (A2C): 33.7 ml 17.07 ml/m  RA Volume:   18.40 ml 9.32 ml/m LA Vol (A4C): 24.6 ml 12.46 ml/m  AORTIC VALVE                    PULMONIC VALVE AV Area (Vmax):    2.25 cm     PV Vmax:       0.71 m/s AV Area (Vmean):   2.08 cm     PV Peak grad:  2.0 mmHg AV Area (VTI):     2.03 cm AV Vmax:           89.10 cm/s AV Vmean:          68.500 cm/s AV VTI:            0.187 m AV Peak Grad:      3.2 mmHg AV Mean Grad:      2.0 mmHg LVOT Vmax:         63.90 cm/s LVOT Vmean:        45.300 cm/s LVOT VTI:          0.121 m LVOT/AV VTI ratio: 0.65  AORTA Ao Root diam: 3.60 cm Ao Asc diam:  3.70 cm MITRAL VALVE MV Area (PHT): 2.52 cm    SHUNTS MV Area VTI:   2.17 cm    Systemic VTI:  0.12 m MV Peak grad:  1.7 mmHg    Systemic Diam: 2.00 cm MV Mean grad:  1.0 mmHg MV Vmax:       0.65 m/s MV Vmean:      40.9 cm/s MV Decel Time: 301 msec MV E velocity: 55.50  cm/s MV A velocity: 38.30 cm/s MV E/A ratio:  1.45 Riley Lam MD Electronically signed by Riley Lam MD Signature Date/Time: 01/23/2024/4:04:11  PM    Final    CT Angio Chest PE W and/or Wo Contrast Result Date: 01/23/2024 CLINICAL DATA:  Left chest pain with shortness of breath EXAM: CT ANGIOGRAPHY CHEST WITH CONTRAST TECHNIQUE: Multidetector CT imaging of the chest was performed using the standard protocol during bolus administration of intravenous contrast. Multiplanar CT image reconstructions and MIPs were obtained to evaluate the vascular anatomy. RADIATION DOSE REDUCTION: This exam was performed according to the departmental dose-optimization program which includes automated exposure control, adjustment of the mA and/or kV according to patient size and/or use of iterative reconstruction technique. CONTRAST:  75mL OMNIPAQUE IOHEXOL 350 MG/ML SOLN COMPARISON:  11/08/2023 FINDINGS: Cardiovascular: Satisfactory opacification of the pulmonary arteries. Pulmonary artery filling defect in the left upper lobe affecting apical segmental branch. No additional pulmonary artery filling defect is seen. No cardiomegaly or pericardial effusion. No right heart dilatation. Mediastinum/Nodes: Negative for mass or adenopathy. Lungs/Pleura: Bands of atelectasis in the right lung with elevated diaphragm. No edema, effusion, or pneumothorax. Upper Abdomen: Elevated right diaphragm due to intrathoracic volume loss. Musculoskeletal: Gas around the right shoulder correlating with history of surgery yesterday. No acute osseous finding. Critical Value/emergent results were called by telephone at the time of interpretation on 01/23/2024 at 5:23 am to provider Roxy Horseman , who verbally acknowledged these results. Review of the MIP images confirms the above findings. IMPRESSION: Positive for single segmental pulmonary embolism to the left upper lobe. Electronically Signed   By: Tiburcio Pea M.D.   On: 01/23/2024 05:24   DG Chest 2 View Result Date: 01/23/2024 CLINICAL DATA:  Chest pain and shortness of breath. Right shoulder surgery yesterday. EXAM: CHEST - 2 VIEW  COMPARISON:  11/08/2023 FINDINGS: Heart size and pulmonary vascularity are normal. Mild elevation of right hemidiaphragm with linear opacity in the right mid lung likely atelectasis. No pleural effusion or pneumothorax. Mediastinal contours appear intact. Visualized bones are nondisplaced. IMPRESSION: Elevation of the right hemidiaphragm with probable linear atelectasis in the right mid lung. Electronically Signed   By: Burman Nieves M.D.   On: 01/23/2024 03:09    Microbiology: Results for orders placed or performed during the hospital encounter of 11/08/23  Resp panel by RT-PCR (RSV, Flu A&B, Covid) Anterior Nasal Swab     Status: None   Collection Time: 11/08/23  1:58 PM   Specimen: Anterior Nasal Swab  Result Value Ref Range Status   SARS Coronavirus 2 by RT PCR NEGATIVE NEGATIVE Final    Comment: (NOTE) SARS-CoV-2 target nucleic acids are NOT DETECTED.  The SARS-CoV-2 RNA is generally detectable in upper respiratory specimens during the acute phase of infection. The lowest concentration of SARS-CoV-2 viral copies this assay can detect is 138 copies/mL. A negative result does not preclude SARS-Cov-2 infection and should not be used as the sole basis for treatment or other patient management decisions. A negative result may occur with  improper specimen collection/handling, submission of specimen other than nasopharyngeal swab, presence of viral mutation(s) within the areas targeted by this assay, and inadequate number of viral copies(<138 copies/mL). A negative result must be combined with clinical observations, patient history, and epidemiological information. The expected result is Negative.  Fact Sheet for Patients:  BloggerCourse.com  Fact Sheet for Healthcare Providers:  SeriousBroker.it  This test is no t yet approved or cleared by the [place] FDA and  has been  authorized for detection and/or diagnosis of SARS-CoV-2  by FDA under an Emergency Use Authorization (EUA). This EUA will remain  in effect (meaning this test can be used) for the duration of the COVID-19 declaration under Section 564(b)(1) of the Act, 21 U.S.C.section 360bbb-3(b)(1), unless the authorization is terminated  or revoked sooner.       Influenza A by PCR NEGATIVE NEGATIVE Final   Influenza B by PCR NEGATIVE NEGATIVE Final    Comment: (NOTE) The Xpert Xpress SARS-CoV-2/FLU/RSV plus assay is intended as an aid in the diagnosis of influenza from Nasopharyngeal swab specimens and should not be used as a sole basis for treatment. Nasal washings and aspirates are unacceptable for Xpert Xpress SARS-CoV-2/FLU/RSV testing.  Fact Sheet for Patients: BloggerCourse.com  Fact Sheet for Healthcare Providers: SeriousBroker.it  This test is not yet approved or cleared by the [place] FDA and has been authorized for detection and/or diagnosis of SARS-CoV-2 by FDA under an Emergency Use Authorization (EUA). This EUA will remain in effect (meaning this test can be used) for the duration of the COVID-19 declaration under Section 564(b)(1) of the Act, 21 U.S.C. section 360bbb-3(b)(1), unless the authorization is terminated or revoked.     Resp Syncytial Virus by PCR NEGATIVE NEGATIVE Final    Comment: (NOTE) Fact Sheet for Patients: BloggerCourse.com  Fact Sheet for Healthcare Providers: SeriousBroker.it  This test is not yet approved or cleared by the [place] FDA and has been authorized for detection and/or diagnosis of SARS-CoV-2 by FDA under an Emergency Use Authorization (EUA). This EUA will remain in effect (meaning this test can be used) for the duration of the COVID-19 declaration under Section 564(b)(1) of the Act, 21 U.S.C. section 360bbb-3(b)(1), unless the authorization is terminated or revoked.  Performed at [facility], [address] [place], [place] [address]     Labs: CBC: Recent Labs  Lab 01/23/24 0210 01/24/24 0438 01/25/24 0414  WBC 8.4 8.8 5.9  HGB 13.7 12.3 12.4  HCT 40.2 36.3 36.4  MCV 90.5 91.0 90.5  PLT 279 220 178   Basic Metabolic Panel: Recent Labs  Lab 01/23/24 0210 01/24/24 0438 01/25/24 0414  NA 135 137 137  K 4.2 3.8 3.8  CL 105 108 107  CO2 18* 24 25  GLUCOSE 195* 98 92  BUN 8 11 12   CREATININE 0.88 0.90 0.98  CALCIUM 9.6 8.7* 8.4*   Liver Function Tests: Recent Labs  Lab 01/24/24 0438  AST 17  ALT 18  ALKPHOS 30*  BILITOT 0.4  PROT 6.0*  ALBUMIN 3.2*   CBG: No results for input(s): "GLUCAP" in the last 168 hours.  Discharge time spent: greater than 30 minutes.  Signed: Briant Cedar, MD [facility] 01/25/2024

## 2024-01-26 ENCOUNTER — Inpatient Hospital Stay: Attending: Hematology and Oncology

## 2024-01-26 ENCOUNTER — Encounter: Payer: Self-pay | Admitting: Hematology and Oncology

## 2024-01-26 ENCOUNTER — Other Ambulatory Visit: Payer: Self-pay | Admitting: Hematology and Oncology

## 2024-01-26 ENCOUNTER — Inpatient Hospital Stay (HOSPITAL_BASED_OUTPATIENT_CLINIC_OR_DEPARTMENT_OTHER): Admitting: Hematology and Oncology

## 2024-01-26 ENCOUNTER — Telehealth: Payer: Self-pay

## 2024-01-26 VITALS — BP 115/81 | HR 72 | Temp 97.9°F | Resp 14 | Wt 197.9 lb

## 2024-01-26 DIAGNOSIS — Z86711 Personal history of pulmonary embolism: Secondary | ICD-10-CM

## 2024-01-26 DIAGNOSIS — D500 Iron deficiency anemia secondary to blood loss (chronic): Secondary | ICD-10-CM

## 2024-01-26 DIAGNOSIS — Z79899 Other long term (current) drug therapy: Secondary | ICD-10-CM | POA: Insufficient documentation

## 2024-01-26 DIAGNOSIS — Z801 Family history of malignant neoplasm of trachea, bronchus and lung: Secondary | ICD-10-CM | POA: Insufficient documentation

## 2024-01-26 DIAGNOSIS — Z803 Family history of malignant neoplasm of breast: Secondary | ICD-10-CM | POA: Diagnosis not present

## 2024-01-26 DIAGNOSIS — Z87891 Personal history of nicotine dependence: Secondary | ICD-10-CM | POA: Diagnosis not present

## 2024-01-26 DIAGNOSIS — Z808 Family history of malignant neoplasm of other organs or systems: Secondary | ICD-10-CM | POA: Insufficient documentation

## 2024-01-26 LAB — CBC WITH DIFFERENTIAL (CANCER CENTER ONLY)
Abs Immature Granulocytes: 0.02 10*3/uL (ref 0.00–0.07)
Basophils Absolute: 0.1 10*3/uL (ref 0.0–0.1)
Basophils Relative: 1 %
Eosinophils Absolute: 0.2 10*3/uL (ref 0.0–0.5)
Eosinophils Relative: 4 %
HCT: 39.4 % (ref 36.0–46.0)
Hemoglobin: 13.2 g/dL (ref 12.0–15.0)
Immature Granulocytes: 0 %
Lymphocytes Relative: 33 %
Lymphs Abs: 1.8 10*3/uL (ref 0.7–4.0)
MCH: 30.8 pg (ref 26.0–34.0)
MCHC: 33.5 g/dL (ref 30.0–36.0)
MCV: 92.1 fL (ref 80.0–100.0)
Monocytes Absolute: 0.4 10*3/uL (ref 0.1–1.0)
Monocytes Relative: 7 %
Neutro Abs: 3 10*3/uL (ref 1.7–7.7)
Neutrophils Relative %: 55 %
Platelet Count: 240 10*3/uL (ref 150–400)
RBC: 4.28 MIL/uL (ref 3.87–5.11)
RDW: 12.5 % (ref 11.5–15.5)
WBC Count: 5.4 10*3/uL (ref 4.0–10.5)
nRBC: 0 % (ref 0.0–0.2)

## 2024-01-26 LAB — IRON AND IRON BINDING CAPACITY (CC-WL,HP ONLY)
Iron: 72 ug/dL (ref 28–170)
Saturation Ratios: 23 % (ref 10.4–31.8)
TIBC: 312 ug/dL (ref 250–450)
UIBC: 240 ug/dL (ref 148–442)

## 2024-01-26 LAB — CMP (CANCER CENTER ONLY)
ALT: 55 U/L — ABNORMAL HIGH (ref 0–44)
AST: 73 U/L — ABNORMAL HIGH (ref 15–41)
Albumin: 4.1 g/dL (ref 3.5–5.0)
Alkaline Phosphatase: 39 U/L (ref 38–126)
Anion gap: 4 — ABNORMAL LOW (ref 5–15)
BUN: 16 mg/dL (ref 6–20)
CO2: 30 mmol/L (ref 22–32)
Calcium: 9.2 mg/dL (ref 8.9–10.3)
Chloride: 106 mmol/L (ref 98–111)
Creatinine: 0.79 mg/dL (ref 0.44–1.00)
GFR, Estimated: >60 mL/min (ref >60)
Glucose, Bld: 94 mg/dL (ref 70–99)
Potassium: 4.1 mmol/L (ref 3.5–5.1)
Sodium: 140 mmol/L (ref 135–145)
Total Bilirubin: 0.3 mg/dL (ref 0.0–1.2)
Total Protein: 6.9 g/dL (ref 6.5–8.1)

## 2024-01-26 LAB — RETIC PANEL
Immature Retic Fract: 5.2 % (ref 2.3–15.9)
RBC.: 4.23 MIL/uL (ref 3.87–5.11)
Retic Count, Absolute: 68.5 10*3/uL (ref 19.0–186.0)
Retic Ct Pct: 1.6 % (ref 0.4–3.1)
Reticulocyte Hemoglobin: 34.8 pg (ref >27.9)

## 2024-01-26 LAB — FERRITIN: Ferritin: 121 ng/mL (ref 11–307)

## 2024-01-26 NOTE — Transitions of Care (Post Inpatient/ED Visit) (Signed)
 01/26/2024  Name: Meghan Reed MRN: [id number] DOB: 01-15-1986  Today's TOC FU Call Status: Today's TOC FU Call Status:: Successful TOC FU Call Completed TOC FU Call Complete Date: 01/26/24 Patient's Name and Date of Birth confirmed.  Transition Care Management Follow-up Telephone Call Date of Discharge: 01/25/24 Discharge Facility: [facility]) Type of Discharge: Inpatient Admission Primary Inpatient Discharge Diagnosis:: PE How have you been since you were released from the hospital?: Better Any questions or concerns?: No  Items Reviewed: Did you receive and understand the discharge instructions provided?: Yes Medications obtained,verified, and reconciled?: Yes (Medications Reviewed) Any new allergies since your discharge?: No Dietary orders reviewed?: Yes Do you have support at home?: Yes People in Home: spouse  Medications Reviewed Today: Medications Reviewed Today     Reviewed by Karena Addison, LPN (Licensed Practical Nurse) on 01/26/24 at [phone]  Med List Status: <None>   Medication Order Taking? Sig Documenting Provider Last Dose Status Informant  albuterol (PROVENTIL) (2.5 MG/3ML) 0.083% nebulizer solution [id number] No Take 3 mLs (2.5 mg total) by nebulization every 6 (six) hours as needed for wheezing or shortness of breath. Elenore Paddy, NP Past Week Active Self, Pharmacy Records  albuterol (VENTOLIN HFA) 108 (90 Base) MCG/ACT inhaler [id number] No Inhale 2 puffs into the lungs every 4 (four) hours as needed for wheezing or shortness of breath. Elenore Paddy, NP Past Week Active Self, Pharmacy Records  APIXABAN Everlene Balls) VTE STARTER PACK (10MG  AND 5MG ) [id number]  Take as directed on package: start with two-5mg  tablets twice daily for 7 days. On day 8, switch to one-5mg  tablet twice daily. Briant Cedar, MD  Active   aspirin EC 325 MG tablet [id number] No Take 1 tablet (325 mg total) by mouth daily. [name], MD Unknown Active Self,  Pharmacy Records           Med Note Nedra Hai, NICOLE   Tue Jan 23, 2024  6:48 AM) For future use pending surgery  budesonide-formoterol (SYMBICORT) 160-4.5 MCG/ACT inhaler [id number] No Inhale 2 puffs into the lungs 2 (two) times daily. Elenore Paddy, NP 01/22/2024 Morning Active Self, Pharmacy Records  cyclobenzaprine (FLEXERIL) 10 MG tablet [id number] No Take 1 tablet (10 mg total) by mouth every 8 (eight) hours as needed for muscle spasms. Alicia Amel, MD Past Week Active Self, Pharmacy Records  desloratadine (CLARINEX) 5 MG tablet [id number] No Take 1 tablet (5 mg total) by mouth daily. Ashok Croon, MD Unknown Active Self, Pharmacy Records  ferrous sulfate 325 (65 FE) MG tablet [id number] No Take 1 tablet (325 mg total) by mouth daily with breakfast. Please take with a source of Vitamin C Jaci Standard, MD Past Week Active Self, Pharmacy Records  Galcanezumab-gnlm [facility]) 120 MG/ML Ivory Broad [id number] No Inject 120 mg into the skin every 30 days. Lomax, Amy, NP Past Week Active Self, Pharmacy Records  loratadine (CLARITIN) 10 MG tablet [id number] No Take 1 tablet (10 mg total) by mouth daily. Eyvonne Mechanic, PA-C Unknown Active Self, Pharmacy Records  omeprazole (PRILOSEC OTC) 20 MG tablet [id number] No Take 1 tablet (20 mg total) by mouth daily.  Patient taking differently: Take 20 mg by mouth daily as needed (acid reflux).   Autry-Lott, Randa Evens, DO Past Week Active Self, Pharmacy Records  oxyCODONE (ROXICODONE) 5 MG immediate release tablet [id number] No Take 1 tablet (5 mg total) by mouth every 4 (four) hours as needed for severe pain (pain score 7-10) or breakthrough pain. [name], MD Taking Active Self,  Pharmacy Records           Med Note (LEE, NICOLE   Tue Jan 23, 2024  6:52 AM) Post op medication; not started  predniSONE (DELTASONE) 20 MG tablet [id number] No Take 2 tablets (40 mg total) by mouth daily with breakfast. Elenore Paddy, NP Unknown Active Self, Pharmacy Records            Med Note (LEE, NICOLE   Tue Jan 23, 2024  6:52 AM) On hand for asthma exacerbation  Ubrogepant (UBRELVY) 100 MG TABS [id number] No Take 1 tablet (100 mg) by mouth daily as needed. Lomax, Amy, NP Past Week Active Self, Pharmacy Records            Home Care and Equipment/Supplies: Were Home Health Services Ordered?: NA Any new equipment or medical supplies ordered?: NA  Functional Questionnaire: Do you need assistance with bathing/showering or dressing?: No Do you need assistance with meal preparation?: No Do you need assistance with eating?: No Do you have difficulty maintaining continence: No Do you need assistance with getting out of bed/getting out of a chair/moving?: No Do you have difficulty managing or taking your medications?: No  Follow up appointments reviewed: PCP Follow-up appointment confirmed?: No (sent message to [facility] ADMIN to schedule) Specialist Hospital Follow-up appointment confirmed?: Yes Date of Specialist follow-up appointment?: 01/26/24 Follow-Up Specialty Provider:: hemo Do you need transportation to your follow-up appointment?: No Do you understand care options if your condition(s) worsen?: Yes-patient verbalized understanding    SIGNATURE Karena Addison, LPN [facility] Nurse Health Advisor Direct Dial [phone]

## 2024-01-26 NOTE — Progress Notes (Signed)
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 01/25/24  4:14 AM  Result Value Ref Range   WBC 5.9 4.0 - 10.5 K/uL   RBC 4.02 3.87 - 5.11 MIL/uL   Hemoglobin 12.4 12.0 - 15.0 g/dL   HCT 44.0 34.7 - 42.5 %   MCV 90.5 80.0 - 100.0 fL   MCH 30.8 26.0 - 34.0 pg   MCHC 34.1 30.0 - 36.0 g/dL   RDW 95.6 38.7 - 56.4 %   Platelets 178 150 - 400 K/uL   nRBC 0.3 (H) 0.0 - 0.2 %     Comment: Performed at [facility], [address], [place], [place] [address]  Hemoglobin A1c     Status: Abnormal   Collection Time: 01/25/24  4:14 AM  Result Value Ref Range   Hgb A1c MFr Bld 4.7 (L) 4.8 - 5.6 %    Comment: (NOTE) Pre diabetes:          5.7%-6.4%  Diabetes:              >6.4%  Glycemic control for   <7.0% adults with diabetes    Mean Plasma Glucose 88.19 mg/dL    Comment: Performed at [facility], [address], [place], [place] [address]  Basic metabolic panel     Status: Abnormal   Collection Time: 01/25/24  4:14 AM  Result Value Ref Range   Sodium 137 135 - 145 mmol/L   Potassium 3.8 3.5 - 5.1 mmol/L   Chloride 107 98 - 111 mmol/L   CO2 25 22 - 32 mmol/L   Glucose, Bld 92 70 - 99 mg/dL    Comment: Glucose reference range applies only to samples taken after fasting for at least 8 hours.   BUN 12 6 - 20 mg/dL   Creatinine, Ser 6.04 0.44 - 1.00 mg/dL   Calcium 8.4 (L) 8.9 - 10.3 mg/dL   GFR, Estimated >54 >09 mL/min    Comment: (NOTE) Calculated using the CKD-EPI Creatinine Equation (2021)    Anion gap 5 5 - 15    Comment: Performed at [facility], [address], [place], [place] [address]  Retic Panel     Status: None   Collection Time: 01/26/24  8:27 AM  Result Value Ref Range   Retic Ct Pct 1.6 0.4 - 3.1 %   RBC. 4.23 3.87 - 5.11 MIL/uL   Retic Count, Absolute 68.5 19.0 - 186.0 K/uL   Immature Retic Fract 5.2 2.3 - 15.9 %   Reticulocyte Hemoglobin 34.8 >27.9 pg    Comment:        Given the high negative predictive value of a RET-He result > 32 pg iron deficiency is essentially excluded. If this patient is anemic other etiologies should be considered. Performed at [facility], [address] [place], [place] [address]   Iron and Iron Binding Capacity ([facility] only)     Status: None   Collection Time: 01/26/24  8:28 AM  Result Value Ref Range   Iron 72 28 - 170 ug/dL   TIBC 562 130 - 865  ug/dL   Saturation Ratios 23 10.4 - 31.8 %   UIBC 240 148 - 442 ug/dL    Comment: Performed at [facility], [address] [place], [place] [address]  Ferritin     Status: None   Collection Time: 01/26/24  8:28 AM  Result Value Ref Range   Ferritin 121 11 - 307 ng/mL    Comment: Performed at [facility], [address], [place], [place] [address]  CMP (Cancer Center only)     Status: Abnormal   Collection Time: 01/26/24  8:28 AM  Result Value Ref Range   Sodium 140 135 - 145 mmol/L   Potassium 4.1 3.5 - 5.1 mmol/L   Chloride 106 98 - 111 mmol/L   CO2 30 22 - 32 mmol/L   Glucose, Bld 94 70 - 99 mg/dL    Comment: Glucose reference range applies only to samples taken after fasting for at least 8 hours.   BUN 16 6 - 20 mg/dL   Creatinine 8.41 3.24 - 1.00 mg/dL   Calcium 9.2 8.9 - 40.1 mg/dL   Total Protein 6.9 6.5 - 8.1 g/dL  Albumin 4.1 3.5 - 5.0 g/dL   AST 73 (H) 15 - 41 U/L   ALT 55 (H) 0 - 44 U/L   Alkaline Phosphatase 39 38 - 126 U/L   Total Bilirubin 0.3 0.0 - 1.2 mg/dL   GFR, Estimated >96 >29 mL/min    Comment: (NOTE) Calculated using the CKD-EPI Creatinine Equation (2021)    Anion gap 4 (L) 5 - 15    Comment: Performed at [facility], [address] [place], [place] [address]  CBC with Differential (Cancer Center Only)     Status: None   Collection Time: 01/26/24  8:28 AM  Result Value Ref Range   WBC Count 5.4 4.0 - 10.5 K/uL   RBC 4.28 3.87 - 5.11 MIL/uL   Hemoglobin 13.2 12.0 - 15.0 g/dL   HCT 32.4 40.1 - 02.7 %   MCV 92.1 80.0 - 100.0 fL   MCH 30.8 26.0 - 34.0 pg   MCHC 33.5 30.0 - 36.0 g/dL   RDW 25.3 66.4 - 40.3 %   Platelet Count 240 150 - 400 K/uL   nRBC 0.0 0.0 - 0.2 %   Neutrophils Relative % 55 %   Neutro Abs 3.0 1.7 - 7.7 K/uL   Lymphocytes Relative 33 %   Lymphs Abs 1.8 0.7 - 4.0 K/uL   Monocytes Relative 7 %   Monocytes Absolute 0.4 0.1 - 1.0 K/uL   Eosinophils Relative 4  %   Eosinophils Absolute 0.2 0.0 - 0.5 K/uL   Basophils Relative 1 %   Basophils Absolute 0.1 0.0 - 0.1 K/uL   Immature Granulocytes 0 %   Abs Immature Granulocytes 0.02 0.00 - 0.07 K/uL    Comment: Performed at [facility], [address], [place], [place] [address]    RADIOGRAPHIC STUDIES:  VAS [place] LOWER EXTREMITY VENOUS (DVT) Result Date: 01/24/2024  Lower Venous DVT Study Patient Name:  ALLAYA ABBASI SCRUGGS-HERNANDEZ  Date of Exam:   01/24/2024 Medical Rec #: [id number]                       Accession #:    [id number] Date of Birth: [birth year]                       Patient Gender: F Patient Age:   38 years Exam Location:  [facility] Procedure:      VAS [place] LOWER EXTREMITY VENOUS (DVT) Referring Phys: [facility] EZENDUKA --------------------------------------------------------------------------------  Indications: Pulmonary embolism.  Comparison Study: Previous study on 4.16.2024. Performing Technologist: Fernande Bras  Examination Guidelines: A complete evaluation includes B-mode imaging, spectral Doppler, color Doppler, and power Doppler as needed of all accessible portions of each vessel. Bilateral testing is considered an integral part of a complete examination. Limited examinations for reoccurring indications may be performed as noted. The reflux portion of the exam is performed with the patient in reverse Trendelenburg.  +---------+---------------+---------+-----------+----------+--------------+ RIGHT    CompressibilityPhasicitySpontaneityPropertiesThrombus Aging +---------+---------------+---------+-----------+----------+--------------+ CFV      Full           Yes      Yes                                 +---------+---------------+---------+-----------+----------+--------------+ SFJ      Full           Yes      Yes                                 +---------+---------------+---------+-----------+----------+--------------+  FV Prox  Full                                                         +---------+---------------+---------+-----------+----------+--------------+ FV Mid   Full                                                        +---------+---------------+---------+-----------+----------+--------------+ FV DistalFull                                                        +---------+---------------+---------+-----------+----------+--------------+ PFV      Full                                                        +---------+---------------+---------+-----------+----------+--------------+ POP      Full           Yes      Yes                                 +---------+---------------+---------+-----------+----------+--------------+ PTV      Full                                                        +---------+---------------+---------+-----------+----------+--------------+ PERO     Full                                                        +---------+---------------+---------+-----------+----------+--------------+   +---------+---------------+---------+-----------+----------+--------------+ LEFT     CompressibilityPhasicitySpontaneityPropertiesThrombus Aging +---------+---------------+---------+-----------+----------+--------------+ CFV      Full           Yes      Yes                                 +---------+---------------+---------+-----------+----------+--------------+ SFJ      Full           Yes      Yes                                 +---------+---------------+---------+-----------+----------+--------------+ FV Prox  Full                                                        +---------+---------------+---------+-----------+----------+--------------+  FV Mid   Full                                                        +---------+---------------+---------+-----------+----------+--------------+ FV DistalFull                                                         +---------+---------------+---------+-----------+----------+--------------+ PFV      Full                                                        +---------+---------------+---------+-----------+----------+--------------+ POP      Full           Yes      Yes                                 +---------+---------------+---------+-----------+----------+--------------+ PTV      Full                                                        +---------+---------------+---------+-----------+----------+--------------+ PERO     Full                                                        +---------+---------------+---------+-----------+----------+--------------+     Summary: BILATERAL: - No evidence of deep vein thrombosis seen in the lower extremities, bilaterally. -No evidence of popliteal cyst, bilaterally.   *See table(s) above for measurements and observations. Electronically signed by Coral Else MD on 01/24/2024 at 10:04:20 PM.    Final    ECHOCARDIOGRAM COMPLETE BUBBLE STUDY Result Date: 01/23/2024    ECHOCARDIOGRAM REPORT   Patient Name:   [facility] SCRUGGS-HERNANDEZ Date of Exam: 01/23/2024 Medical Rec #:  [id number]                      Height:       66.0 in Accession #:    [id number]                     Weight:       194.0 lb Date of Birth:  14-Apr-1986                      BSA:          1.974 m Patient Age:    37 years                       BP:           120/81 mmHg Patient Gender: F  HR:           70 bpm. Exam Location:  Inpatient Procedure: 2D Echo, Cardiac Doppler and Color Doppler (Both Spectral and Color            Flow Doppler were utilized during procedure). Indications:    Pulmonary embolism, chest pain  History:        Patient has prior history of Echocardiogram examinations, most                 recent 04/19/2022. Signs/Symptoms:Chest Pain and Murmur; Risk                 Factors:Dyslipidemia.  Sonographer:    Vern Claude Referring Phys:  [id number] EKTA V PATEL IMPRESSIONS  1. Left ventricular ejection fraction, by estimation, is 50 to 55%. The left ventricle has low normal function. The left ventricle has no regional wall motion abnormalities. Left ventricular diastolic parameters were normal.  2. Right ventricular systolic function is normal. The right ventricular size is mildly enlarged.  3. The mitral valve is normal in structure. No evidence of mitral valve regurgitation. No evidence of mitral stenosis.  4. The aortic valve was not well visualized. Aortic valve regurgitation is not visualized. No aortic stenosis is present. Comparison(s): Prior images reviewed side by side. Mild RVOT dilation from prior. FINDINGS  Left Ventricle: Strain and 3D not performed. Left ventricular ejection fraction, by estimation, is 50 to 55%. The left ventricle has low normal function. The left ventricle has no regional wall motion abnormalities. Strain was performed and the global longitudinal strain is indeterminate. The left ventricular internal cavity size was normal in size. There is no left ventricular hypertrophy. Left ventricular diastolic parameters were normal. Right Ventricle: The right ventricular size is mildly enlarged. No increase in right ventricular wall thickness. Right ventricular systolic function is normal. Left Atrium: Left atrial size was normal in size. Right Atrium: Right atrial size was normal in size. Pericardium: There is no evidence of pericardial effusion. Mitral Valve: The mitral valve is normal in structure. No evidence of mitral valve regurgitation. No evidence of mitral valve stenosis. MV peak gradient, 1.7 mmHg. The mean mitral valve gradient is 1.0 mmHg. Tricuspid Valve: The tricuspid valve is normal in structure. Tricuspid valve regurgitation is not demonstrated. No evidence of tricuspid stenosis. Aortic Valve: The aortic valve was not well visualized. Aortic valve regurgitation is not visualized. No aortic stenosis is present.  Aortic valve mean gradient measures 2.0 mmHg. Aortic valve peak gradient measures 3.2 mmHg. Aortic valve area, by VTI measures 2.03 cm. Pulmonic Valve: The pulmonic valve was normal in structure. Pulmonic valve regurgitation is not visualized. No evidence of pulmonic stenosis. Aorta: The aortic root, ascending aorta and aortic arch are all structurally normal, with no evidence of dilitation or obstruction. IAS/Shunts: The atrial septum is grossly normal. Additional Comments: 3D was performed not requiring image post processing on an independent workstation and was indeterminate.  LEFT VENTRICLE PLAX 2D LVIDd:         4.50 cm      Diastology LVIDs:         2.50 cm      LV e' medial:    11.40 cm/s LV PW:         0.70 cm      LV E/e' medial:  4.9 LV IVS:        0.60 cm      LV e' lateral:   15.00 cm/s LVOT diam:  2.00 cm      LV E/e' lateral: 3.7 LV SV:         38 LV SV Index:   19 LVOT Area:     3.14 cm  LV Volumes (MOD) LV vol d, MOD A2C: 101.0 ml LV vol d, MOD A4C: 129.0 ml LV vol s, MOD A2C: 51.0 ml LV vol s, MOD A4C: 63.8 ml LV SV MOD A2C:     50.0 ml LV SV MOD A4C:     129.0 ml LV SV MOD BP:      60.9 ml RIGHT VENTRICLE            IVC RV Basal diam:  3.20 cm    IVC diam: 1.10 cm RV Mid diam:    2.70 cm RV S prime:     8.60 cm/s LEFT ATRIUM           Index        RIGHT ATRIUM          Index LA diam:      2.90 cm 1.47 cm/m   RA Area:     9.84 cm LA Vol (A2C): 33.7 ml 17.07 ml/m  RA Volume:   18.40 ml 9.32 ml/m LA Vol (A4C): 24.6 ml 12.46 ml/m  AORTIC VALVE                    PULMONIC VALVE AV Area (Vmax):    2.25 cm     PV Vmax:       0.71 m/s AV Area (Vmean):   2.08 cm     PV Peak grad:  2.0 mmHg AV Area (VTI):     2.03 cm AV Vmax:           89.10 cm/s AV Vmean:          68.500 cm/s AV VTI:            0.187 m AV Peak Grad:      3.2 mmHg AV Mean Grad:      2.0 mmHg LVOT Vmax:         63.90 cm/s LVOT Vmean:        45.300 cm/s LVOT VTI:          0.121 m LVOT/AV VTI ratio: 0.65  AORTA Ao Root diam: 3.60  cm Ao Asc diam:  3.70 cm MITRAL VALVE MV Area (PHT): 2.52 cm    SHUNTS MV Area VTI:   2.17 cm    Systemic VTI:  0.12 m MV Peak grad:  1.7 mmHg    Systemic Diam: 2.00 cm MV Mean grad:  1.0 mmHg MV Vmax:       0.65 m/s MV Vmean:      40.9 cm/s MV Decel Time: 301 msec MV E velocity: 55.50 cm/s MV A velocity: 38.30 cm/s MV E/A ratio:  1.45 Riley Lam MD Electronically signed by Riley Lam MD Signature Date/Time: 01/23/2024/4:04:11 PM    Final    CT Angio Chest PE W and/or Wo Contrast Result Date: 01/23/2024 CLINICAL DATA:  Left chest pain with shortness of breath EXAM: CT ANGIOGRAPHY CHEST WITH CONTRAST TECHNIQUE: Multidetector CT imaging of the chest was performed using the standard protocol during bolus administration of intravenous contrast. Multiplanar CT image reconstructions and MIPs were obtained to evaluate the vascular anatomy. RADIATION DOSE REDUCTION: This exam was performed according to the departmental dose-optimization program which includes automated exposure control, adjustment of the mA and/or kV according to patient size and/or use  of iterative reconstruction technique. CONTRAST:  75mL OMNIPAQUE IOHEXOL 350 MG/ML SOLN COMPARISON:  11/08/2023 FINDINGS: Cardiovascular: Satisfactory opacification of the pulmonary arteries. Pulmonary artery filling defect in the left upper lobe affecting apical segmental branch. No additional pulmonary artery filling defect is seen. No cardiomegaly or pericardial effusion. No right heart dilatation. Mediastinum/Nodes: Negative for mass or adenopathy. Lungs/Pleura: Bands of atelectasis in the right lung with elevated diaphragm. No edema, effusion, or pneumothorax. Upper Abdomen: Elevated right diaphragm due to intrathoracic volume loss. Musculoskeletal: Gas around the right shoulder correlating with history of surgery yesterday. No acute osseous finding. Critical Value/emergent results were called by telephone at the time of interpretation on  01/23/2024 at 5:23 am to provider Roxy Horseman , who verbally acknowledged these results. Review of the MIP images confirms the above findings. IMPRESSION: Positive for single segmental pulmonary embolism to the left upper lobe. Electronically Signed   By: Tiburcio Pea M.D.   On: 01/23/2024 05:24   DG Chest 2 View Result Date: 01/23/2024 CLINICAL DATA:  Chest pain and shortness of breath. Right shoulder surgery yesterday. EXAM: CHEST - 2 VIEW COMPARISON:  11/08/2023 FINDINGS: Heart size and pulmonary vascularity are normal. Mild elevation of right hemidiaphragm with linear opacity in the right mid lung likely atelectasis. No pleural effusion or pneumothorax. Mediastinal contours appear intact. Visualized bones are nondisplaced. IMPRESSION: Elevation of the right hemidiaphragm with probable linear atelectasis in the right mid lung. Electronically Signed   By: Burman Nieves M.D.   On: 01/23/2024 03:09    ASSESSMENT & PLAN Elynore Dolinski 38 y.o. 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  She has been taking her iron prescription as prescribed and does not currently have any symptoms of recurrent VTE.     #Pulmonary Embolism in Setting of Pregnancy # Post Operative Pulmonary Embolism  --patient continued lovenox until 12/12/2019 (6 weeks post partum). Completed full course of anticoagulation -- Patient had a tubal ligation, no anticipated pregnancies in the future.   -- Patient underwent shoulder surgery on 01/22/2024, subsequently developed PE on 01/23/2024. --Recommend 3 to 6 months of anticoagulation therapy.  Plan to see the patient back in 3 months to determine if she requires to her  74-month course. -- Patient notes that she is interested in indefinite anticoagulation given the fact that she is prone to clotting at such young age.  Both blood clots were technically provoked, however if anticoagulation would be reasonable because she does have a proclivity for bleeding. --Will discuss further next visit 3 months.   #Anemia in the Post Partum Period, resolved.  #Peripartum Hemorrhage --Labs today show white blood cell count 5.4, hemoglobin 13.2, MCV 92.1, platelets 240 --continue to monitor   #Thrombocytopenia s/p Post Partum Hemorrhage, resolved.  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  All questions were answered. The patient knows to call the clinic with any problems, questions or concerns.  A total of more than 40 minutes were spent on this encounter and over half of that time was spent on counseling and coordination of care  as outlined above.   Ulysees Barns, MD Department of Hematology/Oncology [facility] Phone: [phone] Pager: [phone] Email: [email or web address]  01/26/2024 2:42 PM

## 2024-01-31 ENCOUNTER — Ambulatory Visit: Admitting: Student

## 2024-01-31 ENCOUNTER — Encounter: Payer: Self-pay | Admitting: Student

## 2024-01-31 ENCOUNTER — Other Ambulatory Visit (HOSPITAL_COMMUNITY): Payer: Self-pay

## 2024-01-31 VITALS — BP 104/86 | HR 77 | Ht 66.0 in | Wt 200.2 lb

## 2024-01-31 DIAGNOSIS — I2699 Other pulmonary embolism without acute cor pulmonale: Secondary | ICD-10-CM

## 2024-01-31 DIAGNOSIS — R7401 Elevation of levels of liver transaminase levels: Secondary | ICD-10-CM | POA: Insufficient documentation

## 2024-01-31 DIAGNOSIS — M25511 Pain in right shoulder: Secondary | ICD-10-CM | POA: Diagnosis not present

## 2024-01-31 DIAGNOSIS — M25519 Pain in unspecified shoulder: Secondary | ICD-10-CM | POA: Insufficient documentation

## 2024-01-31 MED ORDER — OXYCODONE HCL 5 MG PO TABS
5.0000 mg | ORAL_TABLET | Freq: Two times a day (BID) | ORAL | 0 refills | Status: AC | PRN
  Filled 2024-01-31: qty 10, 5d supply, fill #0

## 2024-01-31 NOTE — Assessment & Plan Note (Addendum)
 Transaminits noted on CMP on 01/26/24 with elevated AST (73) and ALT (55) Repeat CMP in 4-6 weeks Avoid hepatotoxic medications

## 2024-01-31 NOTE — Assessment & Plan Note (Signed)
 Continue Eliquis starter pack. F/u with oncology as scheduled.

## 2024-01-31 NOTE — Progress Notes (Signed)
    SUBJECTIVE:   CHIEF COMPLAINT / HPI:   Meghan Reed is a 38 year old female here for hospital follow-up. She was admitted 3/25-3/27 for provoked acute pulmonary embolism after having shoulder surgery for rotator cuff impingement on 01/22/2024. CTA showed segmental PE to left upper lobe, negative DVT bilateral lower extremities.  She was started on oral Eliquis starter pack.  She is currently taking 10 mg twice a day for 7 days (01/25/2019 5-02/01/2024) and will thereafter take 5 mg twice daily. Plan for outpatient follow-up with Dr. Leonides Schanz (hematology).  She has a history of PE in 2020, during pregnancy.  Today, she feels okay. Still has some shortness of breath. Also having some congestion, and knows that the pollen worsens her breathing. Cannot take ibuprofen for pain, ortho said she can ask [place] about giving some short-term narcotic for pain relief because she will not be seeing them for office visit for a while  She is not taking aspirin anymore (this was taken after her surgery for  She is using her incentive spirometer at home.  PERTINENT  PMH / PSH:    OBJECTIVE:   BP 104/86   Pulse 77   Ht 5\' 6"  (1.676 m)   Wt 200 lb 3.2 oz (90.8 kg)   LMP  (LMP Unknown)   SpO2 100%   BMI 32.31 kg/m   General: NAD, well appearing, in good spirits Cardiac: RRR Neuro: A&O Respiratory: normal WOB on RA. No wheezing or crackles on auscultation, good lung sounds throughout Extremities:Wearing sling on right arm  ASSESSMENT/PLAN:   Acute pulmonary embolism (HCC) Continue Eliquis starter pack. F/u with oncology as scheduled.  Transaminitis Transaminits noted on CMP on 01/26/24 with elevated AST (73) and ALT (55) Repeat CMP in 4-6 weeks Avoid hepatotoxic medications   Shoulder pain S/p rotator cuff repair. Prescribed short  5-day course of oxycodone 5 mg #10 tablets for pain management, to be taken every 12 hours as needed for severe pain (on Eliquis right now and  has transaminitis on recent labs) F/u with orthopedic surgery on 02/05/2024    Darral Dash, DO [facility]

## 2024-01-31 NOTE — Patient Instructions (Addendum)
 It was great seeing you today.  As we discussed, -You may take the pain medicine I prescribed as needed every 12 hours for up to 5 days -Continue to mobilize and get up and walk around, use your incentive spirometer   If you have any questions or concerns, please feel free to call the clinic.   Have a wonderful day,  Dr. Darral Dash [facility] [phone]

## 2024-01-31 NOTE — Assessment & Plan Note (Signed)
 S/p rotator cuff repair. Prescribed short  5-day course of oxycodone 5 mg #10 tablets for pain management, to be taken every 12 hours as needed for severe pain (on Eliquis right now and has transaminitis on recent labs) F/u with orthopedic surgery on 02/05/2024

## 2024-02-03 ENCOUNTER — Other Ambulatory Visit (HOSPITAL_COMMUNITY): Payer: Self-pay

## 2024-02-05 ENCOUNTER — Ambulatory Visit (HOSPITAL_BASED_OUTPATIENT_CLINIC_OR_DEPARTMENT_OTHER): Payer: Medicaid Other | Admitting: Orthopaedic Surgery

## 2024-02-05 DIAGNOSIS — M7541 Impingement syndrome of right shoulder: Secondary | ICD-10-CM
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
## 2024-02-19 ENCOUNTER — Encounter (HOSPITAL_BASED_OUTPATIENT_CLINIC_OR_DEPARTMENT_OTHER): Payer: Self-pay | Admitting: Orthopaedic Surgery

## 2024-02-19 ENCOUNTER — Other Ambulatory Visit (HOSPITAL_COMMUNITY): Payer: Self-pay

## 2024-02-19 ENCOUNTER — Other Ambulatory Visit: Payer: Self-pay | Admitting: Student

## 2024-02-19 ENCOUNTER — Other Ambulatory Visit: Payer: Self-pay

## 2024-02-19 DIAGNOSIS — M5412 Radiculopathy, cervical region: Secondary | ICD-10-CM

## 2024-02-27 ENCOUNTER — Encounter: Payer: Self-pay | Admitting: Hematology and Oncology

## 2024-02-27 ENCOUNTER — Encounter (HOSPITAL_BASED_OUTPATIENT_CLINIC_OR_DEPARTMENT_OTHER): Payer: Self-pay | Admitting: Orthopaedic Surgery

## 2024-02-28 ENCOUNTER — Other Ambulatory Visit (HOSPITAL_COMMUNITY): Payer: Self-pay

## 2024-02-29 ENCOUNTER — Other Ambulatory Visit: Payer: Self-pay | Admitting: *Deleted

## 2024-02-29 ENCOUNTER — Other Ambulatory Visit (HOSPITAL_COMMUNITY): Payer: Self-pay

## 2024-02-29 MED ORDER — APIXABAN 5 MG PO TABS
5.0000 mg | ORAL_TABLET | Freq: Two times a day (BID) | ORAL | 2 refills | Status: DC
  Filled 2024-02-29: qty 60, 30d supply, fill #0

## 2024-03-01 ENCOUNTER — Other Ambulatory Visit (HOSPITAL_COMMUNITY): Payer: Self-pay

## 2024-03-01 ENCOUNTER — Telehealth: Payer: Self-pay | Admitting: *Deleted

## 2024-03-01 ENCOUNTER — Other Ambulatory Visit: Payer: Self-pay | Admitting: Hematology and Oncology

## 2024-03-01 ENCOUNTER — Encounter: Payer: Self-pay | Admitting: *Deleted

## 2024-03-01 MED ORDER — APIXABAN 5 MG PO TABS
5.0000 mg | ORAL_TABLET | Freq: Two times a day (BID) | ORAL | 5 refills | Status: DC
  Filled 2024-03-01: qty 60, 30d supply, fill #0
  Filled 2024-04-16: qty 60, 30d supply, fill #1

## 2024-03-01 NOTE — Telephone Encounter (Signed)
 TCT patient to discuss the need for a letter of accommodation for her job due to side effects from her eliquis .  Meghan Reed provided the needed information. A letter was written and sent to her via MYChart and by mail.

## 2024-03-04 ENCOUNTER — Encounter: Payer: Self-pay | Admitting: Primary Care

## 2024-03-04 ENCOUNTER — Ambulatory Visit: Admitting: Primary Care

## 2024-03-04 VITALS — BP 100/65 | HR 87 | Temp 97.7°F | Ht 66.5 in | Wt 200.8 lb

## 2024-03-04 DIAGNOSIS — J4540 Moderate persistent asthma, uncomplicated: Secondary | ICD-10-CM | POA: Diagnosis not present

## 2024-03-04 DIAGNOSIS — Z86711 Personal history of pulmonary embolism: Secondary | ICD-10-CM

## 2024-03-04 DIAGNOSIS — R000 Tachycardia, unspecified: Secondary | ICD-10-CM

## 2024-03-04 DIAGNOSIS — I2699 Other pulmonary embolism without acute cor pulmonale: Secondary | ICD-10-CM

## 2024-03-04 DIAGNOSIS — Z7901 Long term (current) use of anticoagulants: Secondary | ICD-10-CM | POA: Diagnosis not present

## 2024-03-04 DIAGNOSIS — Z87891 Personal history of nicotine dependence: Secondary | ICD-10-CM

## 2024-03-04 NOTE — Patient Instructions (Signed)
-  PULMONARY EMBOLISM: A pulmonary embolism is a blood clot in the lungs. You have a recent left segmental pulmonary embolism and are currently on Eliquis . Continue taking Eliquis  as prescribed. Stay hydrated and change positions slowly to manage dizziness. Use a water pick for oral hygiene and avoid activities that could cause injury. Monitor for signs of abnormal bleeding, such as in urine or stool. We will perform an EKG to assess your heart rhythm and check your oxygen levels in the office. Follow up with hematology in June to determine your long-term anticoagulation plan.  -PALPITATIONS: Palpitations are feelings of having a fast-beating, fluttering, or pounding heart. You have intermittent palpitations and an increased heart rate, particularly after physical activity. We will perform an EKG to assess your heart rhythm. Discuss these symptoms with cardiology during your upcoming follow-up.  -ASTHMA: Asthma is a condition in which your airways narrow and swell and may produce extra mucus. Your asthma is managed with Symbicort  twice daily and albuterol  as needed for flare-ups or pre-exercise. Continue using Symbicort  twice daily and albuterol  as needed. Monitor your symptoms and adjust treatment as necessary.  INSTRUCTIONS: Follow up with hematology in June to determine your long-term anticoagulation plan. Discuss your palpitations and heart rate symptoms with cardiology during your upcoming follow-up.  Follow-up New pulmonary MD 3 months - Recommend Dr. Dione Franks or Dr. Diania Fortes
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
## 2024-03-06 ENCOUNTER — Ambulatory Visit (INDEPENDENT_AMBULATORY_CARE_PROVIDER_SITE_OTHER): Admitting: Student

## 2024-03-06 ENCOUNTER — Other Ambulatory Visit: Payer: Self-pay

## 2024-03-06 ENCOUNTER — Ambulatory Visit (HOSPITAL_BASED_OUTPATIENT_CLINIC_OR_DEPARTMENT_OTHER): Attending: Orthopaedic Surgery | Admitting: Physical Therapy

## 2024-03-06 ENCOUNTER — Ambulatory Visit (INDEPENDENT_AMBULATORY_CARE_PROVIDER_SITE_OTHER): Admitting: Orthopaedic Surgery

## 2024-03-06 ENCOUNTER — Encounter (HOSPITAL_BASED_OUTPATIENT_CLINIC_OR_DEPARTMENT_OTHER): Payer: Self-pay | Admitting: Physical Therapy

## 2024-03-06 ENCOUNTER — Encounter: Payer: Self-pay | Admitting: Student

## 2024-03-06 VITALS — BP 102/69 | HR 82 | Ht 66.0 in | Wt 201.2 lb

## 2024-03-06 DIAGNOSIS — M7541 Impingement syndrome of right shoulder: Secondary | ICD-10-CM | POA: Diagnosis not present

## 2024-03-06 DIAGNOSIS — M25511 Pain in right shoulder: Secondary | ICD-10-CM | POA: Diagnosis present

## 2024-03-06 DIAGNOSIS — E785 Hyperlipidemia, unspecified: Secondary | ICD-10-CM

## 2024-03-06 DIAGNOSIS — R002 Palpitations: Secondary | ICD-10-CM | POA: Insufficient documentation

## 2024-03-06 DIAGNOSIS — M25611 Stiffness of right shoulder, not elsewhere classified: Secondary | ICD-10-CM | POA: Insufficient documentation

## 2024-03-06 DIAGNOSIS — M6281 Muscle weakness (generalized): Secondary | ICD-10-CM | POA: Insufficient documentation

## 2024-03-06 DIAGNOSIS — I2699 Other pulmonary embolism without acute cor pulmonale: Secondary | ICD-10-CM | POA: Insufficient documentation

## 2024-03-06 DIAGNOSIS — Z0000 Encounter for general adult medical examination without abnormal findings: Secondary | ICD-10-CM

## 2024-03-06 DIAGNOSIS — R7989 Other specified abnormal findings of blood chemistry: Secondary | ICD-10-CM

## 2024-03-06 DIAGNOSIS — Z23 Encounter for immunization: Secondary | ICD-10-CM | POA: Diagnosis present

## 2024-03-06 MED ORDER — LIDOCAINE HCL 1 % IJ SOLN
4.0000 mL | INTRAMUSCULAR | Status: AC | PRN
  Administered 2024-03-06: 4 mL

## 2024-03-06 MED ORDER — TRIAMCINOLONE ACETONIDE 40 MG/ML IJ SUSP
80.0000 mg | INTRAMUSCULAR | Status: AC | PRN
  Administered 2024-03-06: 80 mg via INTRA_ARTICULAR

## 2024-03-06 NOTE — Patient Instructions (Addendum)
 It is always great to see you!  We'll give you your pneumonia vaccine early due to your asthma.  I'm glad heme/onc is following you for your PE. The plan for full-dose x6 months followed by maintenance dosing makes sense to me.  I'm repeating your liver labs and lipids today. I'll touch base later this week either by phone or mychart with these results.   Alexa Andrews, MD
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
## 2024-03-06 NOTE — Progress Notes (Signed)
 03/11/24 ALL: Meghan Reed presents for second Botox . She reports initial improvement in headache days after last procedure but then had shoulder surgery resulting complicated by pulmonary embolism. Now on lifelong anticoagulation. She tolerated procedure well. Ubrelvy  and Advil  help with abortive therapy.   12/05/2023 ALL: Meghan Reed presents for first Botox  procedure. She continues Emgality  and Ubrelvy . She averages at least 10-15 migraine days a month. She gets some relief with Ubrelvy  and Advil .     Consent Form Botulism Toxin Injection For Chronic Migraine    Reviewed orally with patient, additionally signature is on file:  Botulism toxin has been approved by the Federal drug administration for treatment of chronic migraine. Botulism toxin does not cure chronic migraine and it may not be effective in some patients.  The administration of botulism toxin is accomplished by injecting a small amount of toxin into the muscles of the neck and head. Dosage must be titrated for each individual. Any benefits resulting from botulism toxin tend to wear off after 3 months with a repeat injection required if benefit is to be maintained. Injections are usually done every 3-4 months with maximum effect peak achieved by about 2 or 3 weeks. Botulism toxin is expensive and you should be sure of what costs you will incur resulting from the injection.  The side effects of botulism toxin use for chronic migraine may include:   -Transient, and usually mild, facial weakness with facial injections  -Transient, and usually mild, head or neck weakness with head/neck injections  -Reduction or loss of forehead facial animation due to forehead muscle weakness  -Eyelid drooping  -Dry eye  -Pain at the site of injection or bruising at the site of injection  -Double vision  -Potential unknown long term risks   Contraindications: You should not have Botox  if you are pregnant, nursing, allergic to albumin, have an  infection, skin condition, or muscle weakness at the site of the injection, or have myasthenia gravis, Lambert-Eaton syndrome, or ALS.  It is also possible that as with any injection, there may be an allergic reaction or no effect from the medication. Reduced effectiveness after repeated injections is sometimes seen and rarely infection at the injection site may occur. All care will be taken to prevent these side effects. If therapy is given over a long time, atrophy and wasting in the muscle injected may occur. Occasionally the patient's become refractory to treatment because they develop antibodies to the toxin. In this event, therapy needs to be modified.  I have read the above information and consent to the administration of botulism toxin.    BOTOX  PROCEDURE NOTE FOR MIGRAINE HEADACHE  Contraindications and precautions discussed with patient(above). Aseptic procedure was observed and patient tolerated procedure. Procedure performed by Terrilyn Fick, FNP-C.   The condition has existed for more than 6 months, and pt does not have a diagnosis of ALS, Myasthenia Gravis or Lambert-Eaton Syndrome.  Risks and benefits of injections discussed and pt agrees to proceed with the procedure.  Written consent obtained  These injections are medically necessary. Pt  receives good benefits from these injections. These injections do not cause sedations or hallucinations which the oral therapies may cause.   Description of procedure:  The patient was placed in a sitting position. The standard protocol was used for Botox  as follows, with 5 units of Botox  injected at each site:  -Procerus muscle, midline injection  -Corrugator muscle, bilateral injection  -Frontalis muscle, bilateral injection, with 2 sites each side, medial injection was  performed in the upper one third of the frontalis muscle, in the region vertical from the medial inferior edge of the superior orbital rim. The lateral injection was again in  the upper one third of the forehead vertically above the lateral limbus of the cornea, 1.5 cm lateral to the medial injection site.  -Temporalis muscle injection, 4 sites, bilaterally. The first injection was 3 cm above the tragus of the ear, second injection site was 1.5 cm to 3 cm up from the first injection site in line with the tragus of the ear. The third injection site was 1.5-3 cm forward between the first 2 injection sites. The fourth injection site was 1.5 cm posterior to the second injection site. 5th site laterally in the temporalis  muscleat the level of the outer canthus.  -Occipitalis muscle injection, 3 sites, bilaterally. The first injection was done one half way between the occipital protuberance and the tip of the mastoid process behind the ear. The second injection site was done lateral and superior to the first, 1 fingerbreadth from the first injection. The third injection site was 1 fingerbreadth superiorly and medially from the first injection site.  -Cervical paraspinal muscle injection, 2 sites, bilaterally. The first injection site was 1 cm from the midline of the cervical spine, 3 cm inferior to the lower border of the occipital protuberance. The second injection site was 1.5 cm superiorly and laterally to the first injection site.  -Trapezius muscle injection was performed at 3 sites, bilaterally. The first injection site was in the upper trapezius muscle halfway between the inflection point of the neck, and the acromion. The second injection site was one half way between the acromion and the first injection site. The third injection was done between the first injection site and the inflection point of the neck.   Will return for repeat injection in 3 months.   A total of 200 units of Botox  was prepared, 155 units of Botox  was injected as documented above, any Botox  not injected was wasted. The patient tolerated the procedure well, there were no complications of the above  procedure.

## 2024-03-07 ENCOUNTER — Encounter: Payer: Self-pay | Admitting: Student

## 2024-03-07 LAB — COMPREHENSIVE METABOLIC PANEL WITH GFR
ALT: 30 IU/L (ref 0–32)
AST: 23 IU/L (ref 0–40)
Albumin: 4.3 g/dL (ref 3.9–4.9)
Alkaline Phosphatase: 53 IU/L (ref 44–121)
BUN/Creatinine Ratio: 13 (ref 9–23)
BUN: 10 mg/dL (ref 6–20)
Bilirubin Total: 0.3 mg/dL (ref 0.0–1.2)
CO2: 22 mmol/L (ref 20–29)
Calcium: 9.6 mg/dL (ref 8.7–10.2)
Chloride: 103 mmol/L (ref 96–106)
Creatinine, Ser: 0.78 mg/dL (ref 0.57–1.00)
Globulin, Total: 2.3 g/dL (ref 1.5–4.5)
Glucose: 79 mg/dL (ref 70–99)
Potassium: 4.8 mmol/L (ref 3.5–5.2)
Sodium: 140 mmol/L (ref 134–144)
Total Protein: 6.6 g/dL (ref 6.0–8.5)
eGFR: 100 mL/min/{1.73_m2} (ref >59)

## 2024-03-07 LAB — LIPID PANEL
Chol/HDL Ratio: 3.6 ratio (ref 0.0–4.4)
Cholesterol, Total: 231 mg/dL — ABNORMAL HIGH (ref 100–199)
HDL: 65 mg/dL (ref >39)
LDL Chol Calc (NIH): 149 mg/dL — ABNORMAL HIGH (ref 0–99)
Triglycerides: 99 mg/dL (ref 0–149)
VLDL Cholesterol Cal: 17 mg/dL (ref 5–40)

## 2024-03-11 ENCOUNTER — Other Ambulatory Visit (HOSPITAL_COMMUNITY): Payer: Self-pay

## 2024-03-11 ENCOUNTER — Encounter (HOSPITAL_BASED_OUTPATIENT_CLINIC_OR_DEPARTMENT_OTHER): Payer: Self-pay

## 2024-03-11 ENCOUNTER — Ambulatory Visit (HOSPITAL_BASED_OUTPATIENT_CLINIC_OR_DEPARTMENT_OTHER)

## 2024-03-11 ENCOUNTER — Ambulatory Visit (INDEPENDENT_AMBULATORY_CARE_PROVIDER_SITE_OTHER): Payer: Medicaid Other | Admitting: Family Medicine

## 2024-03-11 DIAGNOSIS — G43709 Chronic migraine without aura, not intractable, without status migrainosus: Secondary | ICD-10-CM

## 2024-03-11 MED ORDER — ONABOTULINUMTOXINA 200 UNITS IJ SOLR
155.0000 [IU] | Freq: Once | INTRAMUSCULAR | Status: AC
  Administered 2024-03-11: 155 [IU] via INTRAMUSCULAR

## 2024-03-11 NOTE — Progress Notes (Signed)
 Botox - 200 units x 1 vial Lot: [id number] Expiration: 07/2026 NDC: 6295-2841-32  Bacteriostatic 0.9% Sodium Chloride - 4 mL  Lot: [id number]  Expiration: 08/2024 NDC: 7253-6644-03  Dx: K74.259 B/B  Witnessed by Mace Sawyer

## 2024-03-15 ENCOUNTER — Other Ambulatory Visit

## 2024-03-15 ENCOUNTER — Other Ambulatory Visit: Payer: Self-pay

## 2024-03-15 ENCOUNTER — Other Ambulatory Visit: Payer: Self-pay | Admitting: Family Medicine

## 2024-03-15 ENCOUNTER — Other Ambulatory Visit (HOSPITAL_COMMUNITY): Payer: Self-pay

## 2024-03-15 DIAGNOSIS — M79605 Pain in left leg: Secondary | ICD-10-CM

## 2024-03-15 DIAGNOSIS — Z86711 Personal history of pulmonary embolism: Secondary | ICD-10-CM

## 2024-03-15 LAB — D-DIMER, QUANTITATIVE: D-Dimer, Quant: <0.19 ug{FEU}/mL (ref <0.50)

## 2024-03-16 ENCOUNTER — Ambulatory Visit: Payer: Self-pay | Admitting: Family Medicine

## 2024-03-17 NOTE — Progress Notes (Signed)
 Cardiology Office Note    Date:  03/19/2024  ID:  Meghan Reed, DOB [birth year], MRN [id number] PCP:  Limmie Ren, MD  Cardiologist:  Ahmad Alert, MD  Electrophysiologist:  None   Chief Complaint: Palpitations   History of Present Illness: Meghan Reed    Meghan Reed is a 38 y.o. 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 Alroy Aspen on 01/03/22 for palpitations.  Patient reported she been having palpitations since November 2022, episode started with dizziness.  Patient reported an episode in which she became extremely dizzy and had nystagmus like sensation when she closed her eyes that lasted a minute or so.  She also reported a sharp shooting sharp pain in her chest as well as a constant chest pressure that had been present for 2 weeks.  Patient underwent an exercise treadmill test on 01/11/2022, no ST deviation was noted, there were no arrhythmias during the test.  ECG was negative for ischemia.  It was felt that her chest pain was related to acid reflux and she was started on omeprazole  20 mg daily.  Cardiac monitor revealed sinus rhythm including bradycardia and tachycardia, very rare PACs and rare PVCs.  She triggered the device several times her symptoms including chest comfort and dizziness each of which corresponded with normal sinus rhythm with no arrhythmias.  She was last seen in clinic on 04/05/2022, she reported feeling well with only occasional palpitations.  On chart review patient was admitted from 3/25 through 01/25/2024 for a provoked acute pulmonary embolism after having shoulder surgery for rotator cuff impingement on 01/22/2024.  CTA showed segmental PE to the left upper lobe, negative DVT bilateral lower extremities.  Echocardiogram on 01/23/2024 indicated LVEF of 50 to 55%, no RWMA, diastolic parameters were normal, RV systolic  function was normal, RV size was mildly enlarged, mitral valve was normal in structure, no evidence of mitral regurgitation or stenosis, no aortic stenosis was present. She was started on oral Eliquis  and is now being followed by Dr. Rosaline Coma with hematology.  Today she presents regarding palpitations,, patient reports following her surgery prior to being diagnosed with her pulmonary embolism she had a short fluttering sensation in her chest followed by a pinching sensation that resulted in her presenting to the emergency department where she was diagnosed with PE.  She notes that since her diagnosis she has regularly had palpitations that can occur at any time, at rest or with exertion and typically last a few seconds.  She notes the only associated symptom is some increased shortness of breath.  She denies any chest pain, dizziness, lightheadedness, presyncope or syncope.  She denies any lower extremity edema, orthopnea or PND.  She does note when she lays down that the palpitations are more pronounced and she feels as though she can hear her heartbeat in her ears.  Patient denies recent increases in caffeine intake, notes that she drinks 1 cup of coffee 3 days a week before work, regularly drinks water.  She denies any smoking or recreational drug use.  She reports she occasionally will consume alcohol.  She does note she has problems with insomnia and typically will only get 2-3 uninterrupted hours of sleep, then will wake frequently from sleep until in the morning.  She reports that she is walking regularly and is working with physical therapy.  Labwork independently reviewed: 03/06/2024: Sodium 140, potassium 4.8, creatinine 0.78,  AST 23, ALT 30 01/26/2024: Hemoglobin 13.2, hematocrit 39.4 ROS: .   Today she denies lower extremity edema, fatigue, melena, hematuria, hemoptysis, diaphoresis, weakness, presyncope, syncope, orthopnea, and PND.  All other systems are reviewed and otherwise negative. Studies  Reviewed: Meghan Reed   EKG:  EKG is ordered today, personally reviewed, demonstrating  EKG Interpretation Date/Time:  Tuesday Mar 19 2024 13:17:55 EDT Ventricular Rate:  88 PR Interval:  154 QRS Duration:  76 QT Interval:  370 QTC Calculation: 447 R Axis:   8  Text Interpretation: Normal sinus rhythm Nonspecific ST and T wave abnormality Confirmed by Briscoe Daniello [phone]) on 03/19/2024 1:25:47 PM   CV Studies: Cardiac studies reviewed are outlined and summarized above. Otherwise please see EMR for full report. Cardiac Studies & Procedures   ______________________________________________________________________________________________   STRESS TESTS  EXERCISE TOLERANCE TEST (ETT) 01/11/2022  Narrative   Exercise capacity was normal. Stage 3 was reached after exercising for 8 min and 0 sec. Maximum HR of 162 bpm. MPHR 88.0 %. Peak METS 10.1 . The patient experienced no angina during the test. The patient reported dizziness during the stress test. Onset of symptoms occurred at stage 3 of the protocol. Symptoms began at minute 7:30 during stress and ended at minute 1 during recovery.   No ST deviation was noted. There were no arrhythmias during stress. The ECG was negative for ischemia.   Prior study not available for comparison.   ECHOCARDIOGRAM  ECHOCARDIOGRAM COMPLETE BUBBLE STUDY 01/23/2024  Narrative ECHOCARDIOGRAM REPORT    Patient Name:   Meghan Reed Date of Exam: 01/23/2024 Medical Rec #:  [id number]                      Height:       66.0 in Accession #:    [id number]                     Weight:       194.0 lb Date of Birth:  July 10, 1986                      BSA:          1.974 m Patient Age:    37 years                       BP:           120/81 mmHg Patient Gender: F                              HR:           70 bpm. Exam Location:  Inpatient  Procedure: 2D Echo, Cardiac Doppler and Color Doppler (Both Spectral and Color Flow Doppler were utilized during  procedure).  Indications:    Pulmonary embolism, chest pain  History:        Patient has prior history of Echocardiogram examinations, most recent 04/19/2022. Signs/Symptoms:Chest Pain and Murmur; Risk Factors:Dyslipidemia.  Sonographer:    Aldon Ambrosia Referring Phys: [id number] EKTA V PATEL  IMPRESSIONS   1. Left ventricular ejection fraction, by estimation, is 50 to 55%. The left ventricle has low normal function. The left ventricle has no regional wall motion abnormalities. Left ventricular diastolic parameters were normal. 2. Right ventricular systolic function is normal. The right ventricular size is mildly enlarged. 3. The mitral valve is normal in structure. No evidence of  mitral valve regurgitation. No evidence of mitral stenosis. 4. The aortic valve was not well visualized. Aortic valve regurgitation is not visualized. No aortic stenosis is present.  Comparison(s): Prior images reviewed side by side. Mild RVOT dilation from prior.  FINDINGS Left Ventricle: Strain and 3D not performed. Left ventricular ejection fraction, by estimation, is 50 to 55%. The left ventricle has low normal function. The left ventricle has no regional wall motion abnormalities. Strain was performed and the global longitudinal strain is indeterminate. The left ventricular internal cavity size was normal in size. There is no left ventricular hypertrophy. Left ventricular diastolic parameters were normal.  Right Ventricle: The right ventricular size is mildly enlarged. No increase in right ventricular wall thickness. Right ventricular systolic function is normal.  Left Atrium: Left atrial size was normal in size.  Right Atrium: Right atrial size was normal in size.  Pericardium: There is no evidence of pericardial effusion.  Mitral Valve: The mitral valve is normal in structure. No evidence of mitral valve regurgitation. No evidence of mitral valve stenosis. MV peak gradient, 1.7 mmHg. The mean mitral valve  gradient is 1.0 mmHg.  Tricuspid Valve: The tricuspid valve is normal in structure. Tricuspid valve regurgitation is not demonstrated. No evidence of tricuspid stenosis.  Aortic Valve: The aortic valve was not well visualized. Aortic valve regurgitation is not visualized. No aortic stenosis is present. Aortic valve mean gradient measures 2.0 mmHg. Aortic valve peak gradient measures 3.2 mmHg. Aortic valve area, by VTI measures 2.03 cm.  Pulmonic Valve: The pulmonic valve was normal in structure. Pulmonic valve regurgitation is not visualized. No evidence of pulmonic stenosis.  Aorta: The aortic root, ascending aorta and aortic arch are all structurally normal, with no evidence of dilitation or obstruction.  IAS/Shunts: The atrial septum is grossly normal.  Additional Comments: 3D was performed not requiring image post processing on an independent workstation and was indeterminate.   LEFT VENTRICLE PLAX 2D LVIDd:         4.50 cm      Diastology LVIDs:         2.50 cm      LV e' medial:    11.40 cm/s LV PW:         0.70 cm      LV E/e' medial:  4.9 LV IVS:        0.60 cm      LV e' lateral:   15.00 cm/s LVOT diam:     2.00 cm      LV E/e' lateral: 3.7 LV SV:         38 LV SV Index:   19 LVOT Area:     3.14 cm  LV Volumes (MOD) LV vol d, MOD A2C: 101.0 ml LV vol d, MOD A4C: 129.0 ml LV vol s, MOD A2C: 51.0 ml LV vol s, MOD A4C: 63.8 ml LV SV MOD A2C:     50.0 ml LV SV MOD A4C:     129.0 ml LV SV MOD BP:      60.9 ml  RIGHT VENTRICLE            IVC RV Basal diam:  3.20 cm    IVC diam: 1.10 cm RV Mid diam:    2.70 cm RV S prime:     8.60 cm/s  LEFT ATRIUM           Index        RIGHT ATRIUM  Index LA diam:      2.90 cm 1.47 cm/m   RA Area:     9.84 cm LA Vol (A2C): 33.7 ml 17.07 ml/m  RA Volume:   18.40 ml 9.32 ml/m LA Vol (A4C): 24.6 ml 12.46 ml/m AORTIC VALVE                    PULMONIC VALVE AV Area (Vmax):    2.25 cm     PV Vmax:       0.71 m/s AV Area  (Vmean):   2.08 cm     PV Peak grad:  2.0 mmHg AV Area (VTI):     2.03 cm AV Vmax:           89.10 cm/s AV Vmean:          68.500 cm/s AV VTI:            0.187 m AV Peak Grad:      3.2 mmHg AV Mean Grad:      2.0 mmHg LVOT Vmax:         63.90 cm/s LVOT Vmean:        45.300 cm/s LVOT VTI:          0.121 m LVOT/AV VTI ratio: 0.65  AORTA Ao Root diam: 3.60 cm Ao Asc diam:  3.70 cm  MITRAL VALVE MV Area (PHT): 2.52 cm    SHUNTS MV Area VTI:   2.17 cm    Systemic VTI:  0.12 m MV Peak grad:  1.7 mmHg    Systemic Diam: 2.00 cm MV Mean grad:  1.0 mmHg MV Vmax:       0.65 m/s MV Vmean:      40.9 cm/s MV Decel Time: 301 msec MV E velocity: 55.50 cm/s MV A velocity: 38.30 cm/s MV E/A ratio:  1.45  Gloriann Larger MD Electronically signed by Gloriann Larger MD Signature Date/Time: 01/23/2024/4:04:11 PM    Final    MONITORS  LONG TERM MONITOR (3-14 DAYS) 01/04/2022  Narrative  Sinus rhythm including sinus bradycardia and sinus tachycardia.  She had very rare premature atrial contractions and rare premature ventricular contractions  She triggered the device several times for symptoms including chest discomfort and dizziness. Each time she hit the trigger, she had normal sinus rhythm with no arrhythmias.   Patch Wear Time:  3 days and 0 hours (2023-02-23T21:00:57-0500 to 2023-02-26T21:20:55-0500)  Patient had a min HR of 55 bpm, max HR of 172 bpm, and avg HR of 89 bpm. Predominant underlying rhythm was Sinus Rhythm. Isolated SVEs were rare (<1.0%), and no SVE Couplets or SVE Triplets were present. Isolated VEs were rare (<1.0%), and no VE Couplets or VE Triplets were present.       ______________________________________________________________________________________________       Current Reported Medications:.    Current Meds  Medication Sig   apixaban  (ELIQUIS ) 5 MG TABS tablet Take 1 tablet (5 mg total) by mouth 2 (two) times daily.    budesonide -formoterol  (SYMBICORT ) 160-4.5 MCG/ACT inhaler Inhale 2 puffs into the lungs 2 (two) times daily.   cyclobenzaprine  (FLEXERIL ) 10 MG tablet Take 1 tablet (10 mg total) by mouth every 8 (eight) hours as needed for muscle spasms.   desloratadine  (CLARINEX ) 5 MG tablet Take 1 tablet (5 mg total) by mouth daily.   ferrous sulfate  325 (65 FE) MG tablet Take 1 tablet (325 mg total) by mouth daily with breakfast. Please take with a source of Vitamin C   Galcanezumab -gnlm (EMGALITY )  120 MG/ML SOAJ Inject 120 mg into the skin every 30 days.   loratadine  (CLARITIN ) 10 MG tablet Take 1 tablet (10 mg total) by mouth daily.    Physical Exam:    VS:  BP 102/84 (BP Location: Left Arm, Patient Position: Sitting, Cuff Size: Normal)   Pulse 88   Ht 5' 6.5" (1.689 m)   Wt 196 lb 3.2 oz (89 kg)   LMP  (LMP Unknown)   SpO2 98%   BMI 31.19 kg/m    Wt Readings from Last 3 Encounters:  03/19/24 196 lb 3.2 oz (89 kg)  03/19/24 195 lb 12.8 oz (88.8 kg)  03/06/24 201 lb 3.2 oz (91.3 kg)    GEN: Well nourished, well developed in no acute distress NECK: No JVD; No carotid bruits CARDIAC: RRR, no murmurs, rubs, gallops RESPIRATORY:  Clear to auscultation without rales, wheezing or rhonchi  ABDOMEN: Soft, non-tender, non-distended EXTREMITIES:  No edema; No acute deformity     Asessement and Plan:.    Palpitations: Patient with history of palpitations in 2023, cardiac monitor at that time was reassuring.  Patient with pulmonary embolism diagnosed in 12/2023, on Eliquis .  Echocardiogram on 01/23/2024 indicated LVEF of 50 to 55%, no RWMA, diastolic parameters were normal, RV systolic function was normal, RV size was mildly enlarged, mitral valve was normal in structure, no evidence of mitral regurgitation or stenosis, no aortic stenosis was present.  Today she reports palpitations that occur multiple times a week lasting for a few seconds since she was diagnosed with a pulmonary embolism.  She notes that the  short fluttering sensation in her chest with associated shortness of breath.  She denies any chest pain, dizziness, lightheadedness, presyncope or syncope.  She drinks 1 cup of coffee 3 days a week, denies any smoking or recreational drug use, occasionally consumes alcohol, notes problems with insomnia.  Encouraged patient to stay well-hydrated, decrease caffeine intake.  Check magnesium  level and TSH, recent BMET reassuring.  Patient agreeable to 2-week ZIO monitor.  Reviewed ED precautions.  Continue Eliquis  per Dr. Rosaline Coma for PE.   Pulmonary embolism: Patient with history of pulmonary as well as him in 2020 in setting of pregnancy.  She had recurrence following shoulder surgery for rotator cuff impingement in 12/2023.  Patient reports that she is tolerating Eliquis  well, her breathing is slowly improving, notes increased palpitations as noted above.  She is also followed by pulmonology. She is now on lifelong anticoagulation and followed by hematology.   Disposition: F/u with Lurae Hornbrook, NP in 6-8 weeks   Signed, Montrell Cessna D Treyshawn Muldrew, NP

## 2024-03-19 ENCOUNTER — Encounter: Payer: Self-pay | Admitting: Cardiology

## 2024-03-19 ENCOUNTER — Encounter (HOSPITAL_BASED_OUTPATIENT_CLINIC_OR_DEPARTMENT_OTHER): Payer: Self-pay

## 2024-03-19 ENCOUNTER — Ambulatory Visit
Admission: RE | Admit: 2024-03-19 | Discharge: 2024-03-19 | Disposition: A | Discharge status: Home / Self Care | Source: Ambulatory Visit | Attending: Family Medicine | Admitting: Family Medicine

## 2024-03-19 ENCOUNTER — Ambulatory Visit (INDEPENDENT_AMBULATORY_CARE_PROVIDER_SITE_OTHER): Admitting: Cardiology

## 2024-03-19 ENCOUNTER — Ambulatory Visit (HOSPITAL_BASED_OUTPATIENT_CLINIC_OR_DEPARTMENT_OTHER): Payer: Self-pay

## 2024-03-19 ENCOUNTER — Ambulatory Visit: Attending: Cardiology

## 2024-03-19 ENCOUNTER — Ambulatory Visit (INDEPENDENT_AMBULATORY_CARE_PROVIDER_SITE_OTHER): Admitting: Family Medicine

## 2024-03-19 VITALS — BP 100/70 | HR 88 | Temp 97.6°F | Ht 67.0 in | Wt 195.8 lb

## 2024-03-19 VITALS — BP 102/84 | HR 88 | Ht 66.5 in | Wt 196.2 lb

## 2024-03-19 DIAGNOSIS — R002 Palpitations: Secondary | ICD-10-CM

## 2024-03-19 DIAGNOSIS — I2699 Other pulmonary embolism without acute cor pulmonale: Secondary | ICD-10-CM

## 2024-03-19 DIAGNOSIS — M25562 Pain in left knee: Secondary | ICD-10-CM

## 2024-03-19 DIAGNOSIS — E785 Hyperlipidemia, unspecified: Secondary | ICD-10-CM

## 2024-03-19 DIAGNOSIS — M6281 Muscle weakness (generalized): Secondary | ICD-10-CM

## 2024-03-19 DIAGNOSIS — M25511 Pain in right shoulder: Secondary | ICD-10-CM

## 2024-03-19 DIAGNOSIS — M25611 Stiffness of right shoulder, not elsewhere classified: Secondary | ICD-10-CM

## 2024-03-19 NOTE — Patient Instructions (Addendum)
 I have ordered an x-ray of the knee. You can get this done now at [facility] across the street.  In the meantime, I recommend wearing a soft brace (found at any pharmacy) and taking tylenol  orally and voltaren  gel (over the counter) to put on the knee for pain.  I will send a referral to sports medicine in the meantime.  Let me know if this gets worse before I get back in touch with you!

## 2024-03-19 NOTE — Progress Notes (Unsigned)
 Enrolled for [employer] to mail a ZIO XT long term holter monitor to the patients address on file.   Dr. Elease Hashimoto to read.
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
## 2024-03-19 NOTE — Patient Instructions (Signed)
 Medication Instructions:  No changes *If you need a refill on your cardiac medications before your next appointment, please call your pharmacy*  Lab Work: Today we are going to have yo stop on the 1st floor to get a TSH and Mag levels drawn If you have labs (blood work) drawn today and your tests are completely normal, you will receive your results only by: MyChart Message (if you have MyChart) OR A paper copy in the mail If you have any lab test that is abnormal or we need to change your treatment, we will call you to review the results.  Testing/Procedures: Meghan Reed- Long Term Monitor Instructions  Your physician has requested you wear a ZIO patch monitor for 14 days.  This is a single patch monitor. [employer] supplies one patch monitor per enrollment. Additional stickers are not available. Please do not apply patch if you will be having a Nuclear Stress Test,  Echocardiogram, Cardiac CT, MRI, or Chest Xray during the period you would be wearing the  monitor. The patch cannot be worn during these tests. You cannot remove and re-apply the  ZIO XT patch monitor.  Your ZIO patch monitor will be mailed 3 day USPS to your address on file. It may take 3-5 days  to receive your monitor after you have been enrolled.  Once you have received your monitor, please review the enclosed instructions. Your monitor  has already been registered assigning a specific monitor serial # to you.  Billing and Patient Assistance Program Information  We have supplied [employer] with any of your insurance information on file for billing purposes. [employer] offers a sliding scale Patient Assistance Program for patients that do not have  insurance, or whose insurance does not completely cover the cost of the ZIO monitor.  You must apply for the Patient Assistance Program to qualify for this discounted rate.  To apply, please call [employer] at [phone], select option 4, select option 2, ask to apply for  Patient  Assistance Program. Meghan Reed will ask your household income, and how many people  are in your household. They will quote your out-of-pocket cost based on that information.  [employer] will also be able to set up a 66-month, interest-free payment plan if needed.  Applying the monitor   Shave hair from upper left chest.  Hold abrader disc by orange tab. Rub abrader in 40 strokes over the upper left chest as  indicated in your monitor instructions.  Clean area with 4 enclosed alcohol pads. Let dry.  Apply patch as indicated in monitor instructions. Patch will be placed under collarbone on left  side of chest with arrow pointing upward.  Rub patch adhesive wings for 2 minutes. Remove white label marked "1". Remove the white  label marked "2". Rub patch adhesive wings for 2 additional minutes.  While looking in a mirror, press and release button in center of patch. A small green light will  flash 3-4 times. This will be your only indicator that the monitor has been turned on.  Do not shower for the first 24 hours. You may shower after the first 24 hours.  Press the button if you feel a symptom. You will hear a small click. Record Date, Time and  Symptom in the Patient Logbook.  When you are ready to remove the patch, follow instructions on the last 2 pages of Patient  Logbook. Stick patch monitor onto the last page of Patient Logbook.  Place Patient Logbook in the blue and white box.  Use locking tab on box and tape box closed  securely. The blue and white box has prepaid postage on it. Please place it in the mailbox as  soon as possible. Your physician should have your test results approximately 7 days after the  monitor has been mailed back to [facility].  Call [facility] Customer Care at [phone] if you have questions regarding  your ZIO XT patch monitor. Call them immediately if you see an orange light blinking on your  monitor.  If your monitor falls off in less than 4 days,  contact our Monitor department at [phone].  If your monitor becomes loose or falls off after 4 days call [employer] at [phone] for  suggestions on securing your monitor   Follow-Up: At [facility], you and your health needs are our priority.  As part of our continuing mission to provide you with exceptional heart care, our providers are all part of one team.  This team includes your primary Cardiologist (physician) and Advanced Practice Providers or APPs (Physician Assistants and Nurse Practitioners) who all work together to provide you with the care you need, when you need it.  Your next appointment:   6-8 week(s)  Provider:   Katlyn West, NP   We recommend signing up for the patient portal called "MyChart".  Sign up information is provided on this After Visit Summary.  MyChart is used to connect with patients for Virtual Visits (Telemedicine).  Patients are able to view lab/test results, encounter notes, upcoming appointments, etc.  Non-urgent messages can be sent to your provider as well.   To learn more about what you can do with MyChart, go to [email or web address].

## 2024-03-19 NOTE — Progress Notes (Signed)
    SUBJECTIVE:   CHIEF COMPLAINT / HPI:   Leg swelling and pain Previously obtained BLE US  without DVT. She was on a scooter outside with her kids recently, and she feels she may have gotten off the scooter a wrong way. Symptoms initially started in the L calf and knee, but now the calf tightness/swelling is better and the knee is continuing to hurt. Has a history of a bad fall in 2014 in [place] on her L knee resulting in meniscal/ligamentous injury at that time. Any pressure or movement of the knee hurts (9/10) and it is even hurting at rest. Pain is at the back of the knee and inside the knee. She tried a muscle relaxer without much relief. She feels the pain and swelling has continued despite ibuprofen .  PERTINENT  PMH / PSH: Pelvic congestion syndrome, acute PE, chronic migraine, degenerative disc disease  OBJECTIVE:   BP 100/70   Pulse 88   Temp 97.6 F (36.4 C)   Ht 5\' 7"  (1.702 m)   Wt 195 lb 12.8 oz (88.8 kg)   LMP  (LMP Unknown)   SpO2 97%   BMI 30.67 kg/m   General: Alert and oriented, in NAD Skin: Warm, dry, and intact HEENT: NCAT, EOM grossly normal, midline nasal septum Respiratory: Breathing and speaking comfortably on RA Extremities: Left knee with tenderness to palpation along the medial and lateral joint lines, range of motion limited due to pain, pain with valgus and varus stress testing, negative anterior/posterior drawers, positive Thessaly test Neurological: No gross focal deficit Psychiatric: Appropriate mood and affect   ASSESSMENT/PLAN:   Assessment & Plan Acute pain of left knee Given persistent pain, reduced range of motion, and mechanism of injury, will collect x-rays today to rule out bony abnormality.  Less likely acute DVT given patient is on lifelong Eliquis  for her prior PE and no significant calf pain/erythema/swelling.  Recommended she wear a soft brace and take Tylenol  and Voltaren  gel for pain.  Sports medicine referral sent for further evaluation  of potential meniscal injury with ultrasound.   Genetta Kenning, MD [facility]

## 2024-03-20 ENCOUNTER — Ambulatory Visit: Payer: Self-pay | Admitting: Cardiology

## 2024-03-20 LAB — TSH: TSH: 0.824 u[IU]/mL (ref 0.450–4.500)

## 2024-03-20 LAB — MAGNESIUM: Magnesium: 2.5 mg/dL — ABNORMAL HIGH (ref 1.6–2.3)

## 2024-03-20 NOTE — Telephone Encounter (Signed)
 Left message to call back

## 2024-03-20 NOTE — Telephone Encounter (Signed)
-----   Message from Lorrin Rotter sent at 03/20/2024 12:53 PM EDT ----- Please let Meghan Reed know that her TSH was normal.  Her magnesium  was slightly elevated.  Recommend that she stop any magnesium  supplements or electrolyte beverages such as liquid IV.  Recommend she also reduce intake of high magnesium  foods such as beans, potatoes, nuts and spinach.  She will need a repeat magnesium  level in 2 weeks for monitoring.

## 2024-03-21 ENCOUNTER — Emergency Department (HOSPITAL_BASED_OUTPATIENT_CLINIC_OR_DEPARTMENT_OTHER): Admitting: Radiology

## 2024-03-21 ENCOUNTER — Emergency Department (HOSPITAL_BASED_OUTPATIENT_CLINIC_OR_DEPARTMENT_OTHER)
Admission: EM | Admit: 2024-03-21 | Discharge: 2024-03-21 | Disposition: A | Discharge status: Home / Self Care | Attending: Emergency Medicine | Admitting: Emergency Medicine

## 2024-03-21 ENCOUNTER — Encounter: Payer: Self-pay | Admitting: Neurology

## 2024-03-21 ENCOUNTER — Other Ambulatory Visit: Payer: Self-pay

## 2024-03-21 ENCOUNTER — Encounter: Payer: Self-pay | Admitting: Hematology and Oncology

## 2024-03-21 DIAGNOSIS — R0789 Other chest pain: Secondary | ICD-10-CM | POA: Insufficient documentation

## 2024-03-21 DIAGNOSIS — R002 Palpitations: Secondary | ICD-10-CM | POA: Insufficient documentation

## 2024-03-21 DIAGNOSIS — Z7901 Long term (current) use of anticoagulants: Secondary | ICD-10-CM | POA: Diagnosis not present

## 2024-03-21 LAB — BASIC METABOLIC PANEL WITH GFR
Anion gap: 12 (ref 5–15)
BUN: 14 mg/dL (ref 6–20)
CO2: 25 mmol/L (ref 22–32)
Calcium: 9.6 mg/dL (ref 8.9–10.3)
Chloride: 102 mmol/L (ref 98–111)
Creatinine, Ser: 0.92 mg/dL (ref 0.44–1.00)
GFR, Estimated: >60 mL/min (ref >60)
Glucose, Bld: 87 mg/dL (ref 70–99)
Potassium: 3.5 mmol/L (ref 3.5–5.1)
Sodium: 139 mmol/L (ref 135–145)

## 2024-03-21 LAB — CBC
HCT: 41 % (ref 36.0–46.0)
Hemoglobin: 13.9 g/dL (ref 12.0–15.0)
MCH: 30.6 pg (ref 26.0–34.0)
MCHC: 33.9 g/dL (ref 30.0–36.0)
MCV: 90.3 fL (ref 80.0–100.0)
Platelets: 306 10*3/uL (ref 150–400)
RBC: 4.54 MIL/uL (ref 3.87–5.11)
RDW: 12.5 % (ref 11.5–15.5)
WBC: 9.7 10*3/uL (ref 4.0–10.5)
nRBC: 0 % (ref 0.0–0.2)

## 2024-03-21 LAB — D-DIMER, QUANTITATIVE: D-Dimer, Quant: <0.27 ug{FEU}/mL (ref 0.00–0.50)

## 2024-03-21 LAB — TROPONIN T, HIGH SENSITIVITY: Troponin T High Sensitivity: <15 ng/L (ref <19)

## 2024-03-21 MED ORDER — DIAZEPAM 2 MG PO TABS
2.0000 mg | ORAL_TABLET | Freq: Once | ORAL | Status: AC
  Administered 2024-03-21: 2 mg via ORAL
  Filled 2024-03-21: qty 1

## 2024-03-21 MED ORDER — ACETAMINOPHEN 500 MG PO TABS
1000.0000 mg | ORAL_TABLET | Freq: Once | ORAL | Status: AC
  Administered 2024-03-21: 1000 mg via ORAL
  Filled 2024-03-21: qty 2

## 2024-03-21 NOTE — ED Triage Notes (Signed)
 Pt POV reporting L side chest pressure and SOB that began today at lunch. Dx PE in March, recurrent blood clots, taking eliquis  and followed by hematologist. Pt slightly labored in triage.

## 2024-03-21 NOTE — ED Notes (Signed)
 Reviewed discharge instructions and follow up care with pt. Pt verbalized understanding and had no further questions. Pt exited ED without complications.

## 2024-03-21 NOTE — ED Notes (Signed)
 Patient transported to X-ray

## 2024-03-21 NOTE — ED Notes (Signed)
 ED Provider at bedside.

## 2024-03-21 NOTE — ED Provider Notes (Signed)
 [place] EMERGENCY DEPARTMENT AT [facility] Provider Note   CSN: [id number] Arrival date & time: 03/21/24  2025     History  Chief Complaint  Patient presents with   Chest Pain    Meghan Reed is a 38 y.o. female who presents emergency department with a chief complaint of chest pain.  She had sudden onset while at work today.  She has been having left lower leg pain for the past week.  She had an informal ultrasound to rule out Baker's cyst of the left knee which she has had in the past.  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  She also had a D-dimer at work that was negative. Patient has had a past medical history of 3 previous blood clots.  Most recent was in March when she had a shoulder surgery.  She also is followed by hematology and has had no obvious coagulopathy findings.  She takes Eliquis  daily and although she missed some doses at the beginning has been compliant with medications since.  She complains of chest pain in the left upper quadrant which is localized to point area.  It is aching.  It is not sharp or Rolitta or pleuritic.  She also has sensation of throat closing.  She is going to be on Zio patch for palpitations has had associated palpitations today without loss of consciousness or severe shortness of breath.   Chest Pain      Home Medications Prior to Admission medications   Medication Sig Start Date End Date Taking? Authorizing Provider  albuterol  (PROVENTIL ) (2.5 MG/3ML) 0.083% nebulizer solution Take 3 mLs (2.5 mg total) by nebulization every 6 (six) hours as needed for wheezing or shortness of breath. Patient not taking: Reported on 03/19/2024 01/18/24   Zorita Hiss, NP  albuterol  (VENTOLIN  HFA) 108 [phone] Base) MCG/ACT inhaler Inhale 2 puffs into the lungs every 4 (four) hours as needed for wheezing or shortness of breath. Patient not taking: Reported on 03/19/2024 01/18/24   Zorita Hiss, NP  apixaban  (ELIQUIS ) 5  MG TABS tablet Take 1 tablet (5 mg total) by mouth 2 (two) times daily. 03/01/24   Ander Bame, MD  budesonide -formoterol  (SYMBICORT ) 160-4.5 MCG/ACT inhaler Inhale 2 puffs into the lungs 2 (two) times daily. 01/18/24   Zorita Hiss, NP  cyclobenzaprine  (FLEXERIL ) 10 MG tablet Take 1 tablet (10 mg total) by mouth every 8 (eight) hours as needed for muscle spasms. 07/04/23   Limmie Ren, MD  desloratadine  (CLARINEX ) 5 MG tablet Take 1 tablet (5 mg total) by mouth daily. 06/15/23   Soldatova, Liuba, MD  ferrous sulfate  325 (65 FE) MG tablet Take 1 tablet (325 mg total) by mouth daily with breakfast. Please take with a source of Vitamin C 02/16/23   Dorsey, John T IV, MD  Galcanezumab -gnlm (EMGALITY ) 120 MG/ML SOAJ Inject 120 mg into the skin every 30 days. 05/23/23   Lomax, Amy, NP  loratadine  (CLARITIN ) 10 MG tablet Take 1 tablet (10 mg total) by mouth daily. 06/20/23   Hedges, Susana Enter, PA-C  omeprazole  (PRILOSEC  OTC) 20 MG tablet Take 1 tablet (20 mg total) by mouth daily. Patient not taking: Reported on 03/19/2024 02/25/22   Autry-Lott, Jeneane Miracle, DO  predniSONE  (DELTASONE ) 20 MG tablet Take 2 tablets (40 mg total) by mouth daily with breakfast. Patient not taking: Reported on 03/19/2024 01/18/24   Zorita Hiss, NP  Ubrogepant  (UBRELVY ) 100 MG TABS Take 1 tablet (100 mg) by  mouth daily as needed. Patient not taking: Reported on 03/19/2024 05/23/23   Lomax, Amy, NP      Allergies    Tomato, Aspirin , Nsaids, and Tape    Review of Systems   Review of Systems  Cardiovascular:  Positive for chest pain.    Physical Exam Updated Vital Signs BP 114/83   Pulse (!) 107   Temp 97.9 F (36.6 C)   Resp 18   Ht 5' 6.5" (1.689 m)   Wt 88.5 kg   LMP  (LMP Unknown)   SpO2 100%   BMI 31.00 kg/m  Physical Exam Vitals and nursing note reviewed.  Constitutional:      General: She is not in acute distress.    Appearance: She is well-developed. She is not diaphoretic.  HENT:     Head: Normocephalic  and atraumatic.     Right Ear: External ear normal.     Left Ear: External ear normal.     Nose: Nose normal.     Mouth/Throat:     Mouth: Mucous membranes are moist.  Eyes:     General: No scleral icterus.    Conjunctiva/sclera: Conjunctivae normal.  Cardiovascular:     Rate and Rhythm: Normal rate and regular rhythm.     Heart sounds: Normal heart sounds. No murmur heard.    No friction rub. No gallop.  Pulmonary:     Effort: Pulmonary effort is normal. No respiratory distress.     Breath sounds: Normal breath sounds.  Abdominal:     General: Bowel sounds are normal. There is no distension.     Palpations: Abdomen is soft. There is no mass.     Tenderness: There is no abdominal tenderness. There is no guarding.  Musculoskeletal:     Cervical back: Normal range of motion.  Skin:    General: Skin is warm and dry.  Neurological:     Mental Status: She is alert and oriented to person, place, and time.  Psychiatric:        Behavior: Behavior normal.     ED Results / Procedures / Treatments   Labs (all labs ordered are listed, but only abnormal results are displayed) Labs Reviewed  BASIC METABOLIC PANEL WITH GFR  CBC  TROPONIN T, HIGH SENSITIVITY    EKG None  Radiology DG Chest 2 View Result Date: 03/21/2024 CLINICAL DATA:  Chest pain EXAM: CHEST - 2 VIEW COMPARISON:  01/23/2024 FINDINGS: The heart size and mediastinal contours are within normal limits. Both lungs are clear. The visualized skeletal structures are unremarkable. IMPRESSION: No active cardiopulmonary disease. Electronically Signed   By: Esmeralda Hedge M.D.   On: 03/21/2024 21:08    Procedures Procedures    Medications Ordered in ED Medications - No data to display  ED Course/ Medical Decision Making/ A&P                                 Medical Decision Making Given the large differential diagnosis for Meghan Reed, the decision making in this case is of high complexity.  After  evaluating all of the data points in this case, the presentation of Meghan Reed is NOT consistent with Acute Coronary Syndrome (ACS) and/or myocardial ischemia, pulmonary embolism, aortic dissection; Borhaave's, significant arrythmia, pneumothorax, cardiac tamponade, or other emergent cardiopulmonary condition.  Further, the presentation of Sritha Chauncey Reed is NOT consistent with pericarditis, myocarditis, cholecystitis, pancreatitis, mediastinitis, endocarditis, new valvular  disease.  Additionally, the presentation of Latayvia Mandujano Scruggs-Hernandezis NOT consistent with flail chest, cardiac contusion, ARDS, or significant intra-thoracic or intra-abdominal bleeding.  Moreover, this presentation is NOT consistent with pneumonia, sepsis, or pyelonephritis.  Sxs are NOT consistent with ACS   Strict return and follow-up precautions have been given by me personally or by detailed written instruction given verbally by nursing staff using the teach back method to the patient/family/caregiver(s).  Data Reviewed/Counseling: I have reviewed the patient's vital signs, nursing notes, and other relevant tests/information. I had a detailed discussion regarding the historical points, exam findings, and any diagnostic results supporting the discharge diagnosis. I also discussed the need for outpatient follow-up and the need to return to the ED if symptoms worsen or if there are any questions or concerns that arise at home.    Amount and/or Complexity of Data Reviewed Labs: ordered.    Details: Labs reviewed, Negative troponin and negative D-dimer Radiology: ordered and independent interpretation performed.    Details: I personally visualized and interpreted the images using our PACS system. Acute findings include:  Negative CXR  ECG/medicine tests:     Details: Sinus tachycardia  Risk OTC drugs. Prescription drug management.           Final Clinical  Impression(s) / ED Diagnoses Final diagnoses:  None    Rx / DC Orders ED Discharge Orders     None         Tama Fails, PA-C 03/22/24 1101    Tonya Fredrickson, MD 03/23/24 1026

## 2024-03-21 NOTE — Telephone Encounter (Signed)
 The patient has been notified of the result and verbalized understanding.  All questions (if any) were answered. Marnell Sinks Hymera, RN 03/21/2024 2:40 PM   Ordered lab work for patient to come back in 2 weeks.

## 2024-03-21 NOTE — Discharge Instructions (Signed)

## 2024-03-25 ENCOUNTER — Ambulatory Visit: Payer: Self-pay | Admitting: Family Medicine

## 2024-03-26 ENCOUNTER — Ambulatory Visit: Admitting: Family Medicine

## 2024-03-28 ENCOUNTER — Encounter (HOSPITAL_BASED_OUTPATIENT_CLINIC_OR_DEPARTMENT_OTHER): Payer: Self-pay | Admitting: Physical Therapy

## 2024-03-28 ENCOUNTER — Ambulatory Visit (HOSPITAL_BASED_OUTPATIENT_CLINIC_OR_DEPARTMENT_OTHER): Payer: Self-pay | Admitting: Physical Therapy

## 2024-03-28 DIAGNOSIS — M25511 Pain in right shoulder: Secondary | ICD-10-CM | POA: Diagnosis not present

## 2024-03-28 DIAGNOSIS — M6281 Muscle weakness (generalized): Secondary | ICD-10-CM

## 2024-03-28 DIAGNOSIS — M25611 Stiffness of right shoulder, not elsewhere classified: Secondary | ICD-10-CM
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
## 2024-03-29 ENCOUNTER — Encounter: Payer: Self-pay | Admitting: Hematology and Oncology

## 2024-03-29 ENCOUNTER — Encounter: Payer: Self-pay | Admitting: Family Medicine

## 2024-03-29 ENCOUNTER — Other Ambulatory Visit (HOSPITAL_COMMUNITY): Payer: Self-pay

## 2024-03-29 ENCOUNTER — Ambulatory Visit: Admitting: Family Medicine

## 2024-03-29 VITALS — BP 112/82 | HR 83 | Ht 66.5 in

## 2024-03-29 DIAGNOSIS — R051 Acute cough: Secondary | ICD-10-CM | POA: Diagnosis not present

## 2024-03-29 DIAGNOSIS — J208 Acute bronchitis due to other specified organisms: Secondary | ICD-10-CM

## 2024-03-29 DIAGNOSIS — B9689 Other specified bacterial agents as the cause of diseases classified elsewhere: Secondary | ICD-10-CM | POA: Diagnosis not present

## 2024-03-29 MED ORDER — AZITHROMYCIN 250 MG PO TABS
ORAL_TABLET | ORAL | 0 refills | Status: AC
  Filled 2024-03-29: qty 6, 5d supply, fill #0

## 2024-03-29 MED ORDER — HYDROCODONE BIT-HOMATROP MBR 5-1.5 MG/5ML PO SOLN
5.0000 mL | Freq: Three times a day (TID) | ORAL | 0 refills | Status: DC | PRN
Start: 2024-03-29 — End: 2024-05-21
  Filled 2024-03-29: qty 120, 8d supply, fill #0

## 2024-03-29 MED ORDER — METHYLPREDNISOLONE ACETATE 80 MG/ML IJ SUSP
80.0000 mg | Freq: Once | INTRAMUSCULAR | Status: AC
Start: 2024-03-29 — End: 2024-03-29
  Administered 2024-03-29: 80 mg via INTRAMUSCULAR

## 2024-03-29 MED ORDER — IPRATROPIUM-ALBUTEROL 0.5-2.5 (3) MG/3ML IN SOLN
3.0000 mL | Freq: Once | RESPIRATORY_TRACT | Status: AC
  Administered 2024-03-29: 3 mL via RESPIRATORY_TRACT

## 2024-03-29 NOTE — Patient Instructions (Signed)
 I have sent in azithromycin  for you to take.  Take 2 tablets today, then 1 tablet daily for the next 4 days.  I have sent in hydrocodone  cough syrup for you to take 5 mL once daily in the evening as needed for cough.  This medication may make you sleepy.  Do not drive or operate heavy machinery while taking this medication.  You have received a steroid injection in the office today.  You have received a nebulizer treatment in the office today.  This may increase your heart rate some and make you a little bit jittery.   Follow-up with me for new or worsening symptoms.

## 2024-03-29 NOTE — Progress Notes (Signed)
 Acute Office Visit  Subjective:     Patient ID: Meghan Reed, female    DOB: [birth year], 38 y.o.   MRN: [id number]  Chief Complaint  Patient presents with   Acute Visit    HPI Patient is in today for evaluation of cough, nasal congestion, fatigue, SOB, for the last week. Was seen in the ER on 03/21/24 for atypical chest pain and shortness of breath with reassuring workup, troponin less than 15, D-dimer 0.27. Has tried OTC cough and cold with little relief.  Has also been using albuterol , Clarinex , Symbicort  with little relief.  Has prednisone  on hand, has not been using this. History PE, on Eliquis . Denies known sick contacts, Denies abdominal pain, nausea, vomiting, diarrhea, rash, fever, chills, other symptoms.  Medical hx as outlined below.  ROS Per HPI      Objective:    BP 112/82 (BP Location: Left Arm, Patient Position: Sitting)   Pulse 83   Ht 5' 6.5" (1.689 m)   LMP  (LMP Unknown)   SpO2 97%   BMI 31.00 kg/m    Physical Exam Vitals and nursing note reviewed.  Constitutional:      General: She is not in acute distress.    Appearance: Normal appearance.     Comments: Appears fatigued  HENT:     Head: Normocephalic and atraumatic.     Right Ear: External ear normal.     Left Ear: External ear normal.     Nose: No congestion.     Mouth/Throat:     Mouth: Mucous membranes are moist.     Pharynx: Oropharynx is clear. No oropharyngeal exudate or posterior oropharyngeal erythema.  Eyes:     Extraocular Movements: Extraocular movements intact.  Cardiovascular:     Rate and Rhythm: Regular rhythm. Tachycardia present.     Heart sounds: Normal heart sounds.  Pulmonary:     Effort: Pulmonary effort is normal. No respiratory distress.     Breath sounds: Examination of the right-middle field reveals wheezing. Examination of the left-middle field reveals wheezing. Examination of the right-lower field reveals decreased breath sounds. Examination of  the left-lower field reveals decreased breath sounds. Decreased breath sounds and wheezing present. No rhonchi or rales.     Comments: Productive cough in office Musculoskeletal:     Cervical back: Normal range of motion and neck supple.  Lymphadenopathy:     Cervical: No cervical adenopathy.  Skin:    General: Skin is warm and dry.  Neurological:     General: No focal deficit present.     Mental Status: She is alert and oriented to person, place, and time.    Lung exam prior to neb tx: Decreased breath sounds to bilat lower lobes, wheezing in upper lobes, dry cough O2 sat: 97 HR: 98 Lung sounds post neb tx: mild wheezing to bilat lower lobes, upper lobes clear, cough improved O2 sat: 98 HR: 84  Patient tolerated procedure well, good results, decreased work of breathing   No results found for any visits on 03/29/24.      Assessment & Plan:   Acute bacterial bronchitis -     Azithromycin ; Take 2 tablets on day 1, then 1 tablet daily on days 2 through 5  Dispense: 6 tablet; Refill: 0 -     methylPREDNISolone  Acetate -     Ipratropium-Albuterol   Acute cough -     HYDROcodone  Bit-Homatrop MBr; Take 5 mLs by mouth every 8 (eight) hours as needed for cough.  Dispense: 120 mL; Refill: 0 -     methylPREDNISolone  Acetate     Meds ordered this encounter  Medications   azithromycin  (ZITHROMAX ) 250 MG tablet    Sig: Take 2 tablets on day 1, then 1 tablet daily on days 2 through 5    Dispense:  6 tablet    Refill:  0   HYDROcodone  bit-homatropine (HYCODAN) 5-1.5 MG/5ML syrup    Sig: Take 5 mLs by mouth every 8 (eight) hours as needed for cough.    Dispense:  120 mL    Refill:  0   methylPREDNISolone  acetate (DEPO-MEDROL ) injection 80 mg   ipratropium-albuterol  (DUONEB) 0.5-2.5 (3) MG/3ML nebulizer solution 3 mL    Return if symptoms worsen or fail to improve.  Wellington Half, FNP

## 2024-04-01 ENCOUNTER — Encounter (HOSPITAL_BASED_OUTPATIENT_CLINIC_OR_DEPARTMENT_OTHER): Payer: Self-pay

## 2024-04-01 ENCOUNTER — Ambulatory Visit (HOSPITAL_BASED_OUTPATIENT_CLINIC_OR_DEPARTMENT_OTHER): Attending: Orthopaedic Surgery

## 2024-04-01 DIAGNOSIS — M6281 Muscle weakness (generalized): Secondary | ICD-10-CM | POA: Insufficient documentation

## 2024-04-01 DIAGNOSIS — M25511 Pain in right shoulder: Secondary | ICD-10-CM | POA: Diagnosis present

## 2024-04-01 DIAGNOSIS — M25611 Stiffness of right shoulder, not elsewhere classified: Secondary | ICD-10-CM | POA: Insufficient documentation
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
## 2024-04-04 ENCOUNTER — Telehealth: Payer: Self-pay

## 2024-04-04 NOTE — Telephone Encounter (Signed)
 Notified the pt regarding her work accommodation form being completed. Pt requested that her hardcopy be mailed to her. No questions or concerns at this time.

## 2024-04-05 ENCOUNTER — Encounter (HOSPITAL_BASED_OUTPATIENT_CLINIC_OR_DEPARTMENT_OTHER): Payer: Self-pay

## 2024-04-05 ENCOUNTER — Ambulatory Visit (HOSPITAL_BASED_OUTPATIENT_CLINIC_OR_DEPARTMENT_OTHER)

## 2024-04-05 DIAGNOSIS — M25611 Stiffness of right shoulder, not elsewhere classified: Secondary | ICD-10-CM

## 2024-04-05 DIAGNOSIS — M6281 Muscle weakness (generalized): Secondary | ICD-10-CM

## 2024-04-05 DIAGNOSIS — M25511 Pain in right shoulder: Secondary | ICD-10-CM
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
## 2024-04-09 ENCOUNTER — Encounter: Payer: Self-pay | Admitting: *Deleted

## 2024-04-09 ENCOUNTER — Ambulatory Visit (HOSPITAL_BASED_OUTPATIENT_CLINIC_OR_DEPARTMENT_OTHER): Admitting: Physical Therapy

## 2024-04-09 ENCOUNTER — Encounter (HOSPITAL_BASED_OUTPATIENT_CLINIC_OR_DEPARTMENT_OTHER): Payer: Self-pay | Admitting: Physical Therapy

## 2024-04-09 DIAGNOSIS — M6281 Muscle weakness (generalized): Secondary | ICD-10-CM

## 2024-04-09 DIAGNOSIS — M25511 Pain in right shoulder: Secondary | ICD-10-CM | POA: Diagnosis not present

## 2024-04-09 DIAGNOSIS — M25611 Stiffness of right shoulder, not elsewhere classified: Secondary | ICD-10-CM

## 2024-04-12 ENCOUNTER — Encounter (HOSPITAL_BASED_OUTPATIENT_CLINIC_OR_DEPARTMENT_OTHER): Payer: Self-pay

## 2024-04-12 ENCOUNTER — Ambulatory Visit (HOSPITAL_BASED_OUTPATIENT_CLINIC_OR_DEPARTMENT_OTHER)

## 2024-04-12 DIAGNOSIS — R002 Palpitations: Secondary | ICD-10-CM | POA: Diagnosis not present

## 2024-04-12 DIAGNOSIS — I2699 Other pulmonary embolism without acute cor pulmonale: Secondary | ICD-10-CM | POA: Diagnosis not present
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
## 2024-04-16 ENCOUNTER — Ambulatory Visit (HOSPITAL_BASED_OUTPATIENT_CLINIC_OR_DEPARTMENT_OTHER): Admitting: Physical Therapy

## 2024-04-16 ENCOUNTER — Other Ambulatory Visit: Payer: Self-pay

## 2024-04-16 DIAGNOSIS — M6281 Muscle weakness (generalized): Secondary | ICD-10-CM

## 2024-04-16 DIAGNOSIS — M25511 Pain in right shoulder: Secondary | ICD-10-CM | POA: Diagnosis not present

## 2024-04-16 DIAGNOSIS — M25611 Stiffness of right shoulder, not elsewhere classified: Secondary | ICD-10-CM
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
## 2024-04-17 ENCOUNTER — Encounter (HOSPITAL_BASED_OUTPATIENT_CLINIC_OR_DEPARTMENT_OTHER): Payer: Self-pay | Admitting: Physical Therapy

## 2024-04-19 ENCOUNTER — Ambulatory Visit (HOSPITAL_BASED_OUTPATIENT_CLINIC_OR_DEPARTMENT_OTHER)

## 2024-04-19 ENCOUNTER — Encounter (HOSPITAL_BASED_OUTPATIENT_CLINIC_OR_DEPARTMENT_OTHER): Payer: Self-pay

## 2024-04-19 DIAGNOSIS — M25511 Pain in right shoulder: Secondary | ICD-10-CM

## 2024-04-19 DIAGNOSIS — M6281 Muscle weakness (generalized): Secondary | ICD-10-CM

## 2024-04-19 DIAGNOSIS — M25611 Stiffness of right shoulder, not elsewhere classified: Secondary | ICD-10-CM
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
## 2024-04-23 ENCOUNTER — Ambulatory Visit (HOSPITAL_BASED_OUTPATIENT_CLINIC_OR_DEPARTMENT_OTHER)

## 2024-04-23 ENCOUNTER — Encounter (HOSPITAL_BASED_OUTPATIENT_CLINIC_OR_DEPARTMENT_OTHER): Payer: Self-pay

## 2024-04-23 DIAGNOSIS — M25511 Pain in right shoulder: Secondary | ICD-10-CM | POA: Diagnosis not present

## 2024-04-23 DIAGNOSIS — M6281 Muscle weakness (generalized): Secondary | ICD-10-CM

## 2024-04-23 DIAGNOSIS — M25611 Stiffness of right shoulder, not elsewhere classified: Secondary | ICD-10-CM
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
## 2024-04-24 NOTE — Telephone Encounter (Signed)
 Left message to call back

## 2024-04-24 NOTE — Telephone Encounter (Signed)
-----   Message from Katlyn D West sent at 04/13/2024  8:10 PM EDT ----- Please let Meghan Reed know that her cardiac monitor showed her predominant rhythm was normal sinus rhythm. She had one short and fast episode of beats originating from the top chambers of  the heart, lasting a total of 5 beats. Overall good results! Continue current medications and follow up as planned, will discuss further on follow up.  ----- Message ----- From: Alveta Aleene PARAS, MD Sent: 04/12/2024   5:16 PM EDT To: Katlyn D West, NP

## 2024-04-25 ENCOUNTER — Telehealth: Payer: Self-pay | Admitting: Cardiology

## 2024-04-25 NOTE — Telephone Encounter (Signed)
 Called patient and gave instruction from previous phone note

## 2024-04-25 NOTE — Telephone Encounter (Signed)
 Pt returning your call from yesterday

## 2024-04-25 NOTE — Telephone Encounter (Signed)
 Called patient advised of below they verbalized understanding.

## 2024-04-25 NOTE — Telephone Encounter (Deleted)
 Called patient back advised of below and patient verbalized understanding

## 2024-04-26 ENCOUNTER — Ambulatory Visit (HOSPITAL_BASED_OUTPATIENT_CLINIC_OR_DEPARTMENT_OTHER): Admitting: Orthopaedic Surgery

## 2024-04-26 ENCOUNTER — Other Ambulatory Visit (HOSPITAL_COMMUNITY): Payer: Self-pay

## 2024-04-26 ENCOUNTER — Encounter (HOSPITAL_BASED_OUTPATIENT_CLINIC_OR_DEPARTMENT_OTHER): Payer: Self-pay

## 2024-04-26 ENCOUNTER — Ambulatory Visit (HOSPITAL_BASED_OUTPATIENT_CLINIC_OR_DEPARTMENT_OTHER)

## 2024-04-26 DIAGNOSIS — M25552 Pain in left hip: Secondary | ICD-10-CM

## 2024-04-26 DIAGNOSIS — M25551 Pain in right hip: Secondary | ICD-10-CM
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
## 2024-04-28 NOTE — Progress Notes (Deleted)
 Cardiology Office Note    Date:  04/29/2024  ID:  Meghan Reed, DOB 20-Oct-1986, MRN [id number] PCP:  Marlee Lynwood NOVAK, MD  Cardiologist:  Aleene Passe, MD  Electrophysiologist:  None   Chief Complaint: ***  History of Present Illness: Meghan Reed    Meghan Reed is a 38 y.o. 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 Passe on 01/03/22 for palpitations.  Patient reported she been having palpitations since November 2022, episode started with dizziness.  Patient reported an episode in which she became extremely dizzy and had nystagmus like sensation when she closed her eyes that lasted a minute or so.  She also reported a sharp shooting sharp pain in her chest as well as a constant chest pressure that had been present for 2 weeks.  Patient underwent an exercise treadmill test on 01/11/2022, no ST deviation was noted, there were no arrhythmias during the test.  ECG was negative for ischemia.  It was felt that her chest pain was related to acid reflux and she was started on omeprazole  20 mg daily.  Cardiac monitor revealed sinus rhythm including bradycardia and tachycardia, very rare PACs and rare PVCs.  She triggered the device several times her symptoms including chest comfort and dizziness each of which corresponded with normal sinus rhythm with no arrhythmias.  She was seen in clinic on 04/05/2022, she reported feeling well with only occasional palpitations.  On chart review patient was admitted from 3/25 through 01/25/2024 for a provoked acute pulmonary embolism after having shoulder surgery for rotator cuff impingement on 01/22/2024.  CTA showed segmental PE to the left upper lobe, negative DVT bilateral lower extremities.  Echocardiogram on 01/23/2024 indicated LVEF of 50 to 55%, no RWMA, diastolic parameters were normal, RV systolic function was normal,  RV size was mildly enlarged, mitral valve was normal in structure, no evidence of mitral regurgitation or stenosis, no aortic stenosis was present. She was started on oral Eliquis  and is now being followed by Dr. Federico with hematology.  Patient was last in the clinic on 03/19/2024 for increased palpitations.  Patient reported that following her surgery prior to being diagnosed with pulmonary embolism she had a short fluttering sensation in her chest followed by pinching sensation that resulted in her presenting to Emergency Department where she was diagnosed with PE.  Patient reported that following her diagnosis she regularly had palpitations that could occur at any time, at rest or with exertion and typically lasted a few seconds.  She noted the only associated symptom was some increased shortness of breath.  She denied any chest pain, dizziness, lightheadedness, presyncope or syncope.  Patient was agreeable to a 2-week ZIO monitor.  Cardiac monitor worn for 11 days and 6 hours showed an average heart rate of 93 bpm, predominant underlying rhythm was sinus rhythm, ranging from 58 to 164 bpm, there was 1 run of supraventricular tachycardia lasting 5 beats with a max rate of 133 bpm.  There were rare PACs and rare PVCs.  Today she presents for follow up. She reports that she   Palpitations:   Pulmonary embolism:    Labwork independently reviewed: 04/29/2024: Hemoglobin 14.0, hematocrit 40.7, sodium 137, potassium 4.5, creatinine 0.93, AST 13, ALT 13 ROS: .   *** denies chest pain, shortness of breath, lower extremity edema, fatigue, palpitations, melena, hematuria, hemoptysis, diaphoresis, weakness, presyncope, syncope, orthopnea, and PND.  All  other systems are reviewed and otherwise negative.  Studies Reviewed: Meghan Reed    EKG:  EKG is ordered today, personally reviewed, demonstrating ***     CV Studies: Cardiac studies reviewed are outlined and summarized above. Otherwise please see EMR for full  report. Cardiac Studies & Procedures   ______________________________________________________________________________________________   STRESS TESTS  EXERCISE TOLERANCE TEST (ETT) 01/11/2022  Narrative   Exercise capacity was normal. Stage 3 was reached after exercising for 8 min and 0 sec. Maximum HR of 162 bpm. MPHR 88.0 %. Peak METS 10.1 . The patient experienced no angina during the test. The patient reported dizziness during the stress test. Onset of symptoms occurred at stage 3 of the protocol. Symptoms began at minute 7:30 during stress and ended at minute 1 during recovery.   No ST deviation was noted. There were no arrhythmias during stress. The ECG was negative for ischemia.   Prior study not available for comparison.   ECHOCARDIOGRAM  ECHOCARDIOGRAM COMPLETE BUBBLE STUDY 01/23/2024  Narrative ECHOCARDIOGRAM REPORT    Patient Name:   Meghan Reed Date of Exam: 01/23/2024 Medical Rec #:  [id number]                      Height:       66.0 in Accession #:    [id number]                     Weight:       194.0 lb Date of Birth:  Sep 01, 1986                      BSA:          1.974 m Patient Age:    37 years                       BP:           120/81 mmHg Patient Gender: F                              HR:           70 bpm. Exam Location:  Inpatient  Procedure: 2D Echo, Cardiac Doppler and Color Doppler (Both Spectral and Color Flow Doppler were utilized during procedure).  Indications:    Pulmonary embolism, chest pain  History:        Patient has prior history of Echocardiogram examinations, most recent 04/19/2022. Signs/Symptoms:Chest Pain and Murmur; Risk Factors:Dyslipidemia.  Sonographer:    Sabrina Gentry Referring Phys: [id number] EKTA V PATEL  IMPRESSIONS   1. Left ventricular ejection fraction, by estimation, is 50 to 55%. The left ventricle has low normal function. The left ventricle has no regional wall motion abnormalities. Left ventricular  diastolic parameters were normal. 2. Right ventricular systolic function is normal. The right ventricular size is mildly enlarged. 3. The mitral valve is normal in structure. No evidence of mitral valve regurgitation. No evidence of mitral stenosis. 4. The aortic valve was not well visualized. Aortic valve regurgitation is not visualized. No aortic stenosis is present.  Comparison(s): Prior images reviewed side by side. Mild RVOT dilation from prior.  FINDINGS Left Ventricle: Strain and 3D not performed. Left ventricular ejection fraction, by estimation, is 50 to 55%. The left ventricle has low normal function. The left ventricle has no regional wall motion abnormalities. Strain was performed and the global longitudinal strain  is indeterminate. The left ventricular internal cavity size was normal in size. There is no left ventricular hypertrophy. Left ventricular diastolic parameters were normal.  Right Ventricle: The right ventricular size is mildly enlarged. No increase in right ventricular wall thickness. Right ventricular systolic function is normal.  Left Atrium: Left atrial size was normal in size.  Right Atrium: Right atrial size was normal in size.  Pericardium: There is no evidence of pericardial effusion.  Mitral Valve: The mitral valve is normal in structure. No evidence of mitral valve regurgitation. No evidence of mitral valve stenosis. MV peak gradient, 1.7 mmHg. The mean mitral valve gradient is 1.0 mmHg.  Tricuspid Valve: The tricuspid valve is normal in structure. Tricuspid valve regurgitation is not demonstrated. No evidence of tricuspid stenosis.  Aortic Valve: The aortic valve was not well visualized. Aortic valve regurgitation is not visualized. No aortic stenosis is present. Aortic valve mean gradient measures 2.0 mmHg. Aortic valve peak gradient measures 3.2 mmHg. Aortic valve area, by VTI measures 2.03 cm.  Pulmonic Valve: The pulmonic valve was normal in structure.  Pulmonic valve regurgitation is not visualized. No evidence of pulmonic stenosis.  Aorta: The aortic root, ascending aorta and aortic arch are all structurally normal, with no evidence of dilitation or obstruction.  IAS/Shunts: The atrial septum is grossly normal.  Additional Comments: 3D was performed not requiring image post processing on an independent workstation and was indeterminate.   LEFT VENTRICLE PLAX 2D LVIDd:         4.50 cm      Diastology LVIDs:         2.50 cm      LV e' medial:    11.40 cm/s LV PW:         0.70 cm      LV E/e' medial:  4.9 LV IVS:        0.60 cm      LV e' lateral:   15.00 cm/s LVOT diam:     2.00 cm      LV E/e' lateral: 3.7 LV SV:         38 LV SV Index:   19 LVOT Area:     3.14 cm  LV Volumes (MOD) LV vol d, MOD A2C: 101.0 ml LV vol d, MOD A4C: 129.0 ml LV vol s, MOD A2C: 51.0 ml LV vol s, MOD A4C: 63.8 ml LV SV MOD A2C:     50.0 ml LV SV MOD A4C:     129.0 ml LV SV MOD BP:      60.9 ml  RIGHT VENTRICLE            IVC RV Basal diam:  3.20 cm    IVC diam: 1.10 cm RV Mid diam:    2.70 cm RV S prime:     8.60 cm/s  LEFT ATRIUM           Index        RIGHT ATRIUM          Index LA diam:      2.90 cm 1.47 cm/m   RA Area:     9.84 cm LA Vol (A2C): 33.7 ml 17.07 ml/m  RA Volume:   18.40 ml 9.32 ml/m LA Vol (A4C): 24.6 ml 12.46 ml/m AORTIC VALVE                    PULMONIC VALVE AV Area (Vmax):    2.25 cm     PV Vmax:  0.71 m/s AV Area (Vmean):   2.08 cm     PV Peak grad:  2.0 mmHg AV Area (VTI):     2.03 cm AV Vmax:           89.10 cm/s AV Vmean:          68.500 cm/s AV VTI:            0.187 m AV Peak Grad:      3.2 mmHg AV Mean Grad:      2.0 mmHg LVOT Vmax:         63.90 cm/s LVOT Vmean:        45.300 cm/s LVOT VTI:          0.121 m LVOT/AV VTI ratio: 0.65  AORTA Ao Root diam: 3.60 cm Ao Asc diam:  3.70 cm  MITRAL VALVE MV Area (PHT): 2.52 cm    SHUNTS MV Area VTI:   2.17 cm    Systemic VTI:  0.12 m MV Peak  grad:  1.7 mmHg    Systemic Diam: 2.00 cm MV Mean grad:  1.0 mmHg MV Vmax:       0.65 m/s MV Vmean:      40.9 cm/s MV Decel Time: 301 msec MV E velocity: 55.50 cm/s MV A velocity: 38.30 cm/s MV E/A ratio:  1.45  Stanly Leavens MD Electronically signed by Stanly Leavens MD Signature Date/Time: 01/23/2024/4:04:11 PM    Final    MONITORS  LONG TERM MONITOR (3-14 DAYS) 04/09/2024  Narrative   Predominat rhythm is normal sinus rhythm   There was 1 run of Supraventricular tachycardia.   The episode lasted 5 beats with a max HR of 133 and avg HR of 121   Rare PACs, rare PACs   Patch Wear Time:  11 days and 6 hours (2025-05-23T00:21:19-0400 to 2025-06-03T06:59:54-0400)  Patient had a min HR of 58 bpm, max HR of 164 bpm, and avg HR of 93 bpm. Predominant underlying rhythm was Sinus Rhythm. 1 run of Supraventricular Tachycardia occurred lasting 5 beats with a max rate of 133 bpm (avg 121 bpm). Isolated SVEs were rare (<1.0%), SVE Couplets were rare (<1.0%), and no SVE Triplets were present. Isolated VEs were rare (<1.0%), and no VE Couplets or VE Triplets were present.       ______________________________________________________________________________________________       Current Reported Medications:.    No outpatient medications have been marked as taking for the 05/01/24 encounter (Appointment) with Sye Schroepfer D, NP.    Physical Exam:    VS:  LMP  (LMP Unknown)    Wt Readings from Last 3 Encounters:  04/29/24 194 lb 4.8 oz (88.1 kg)  03/21/24 195 lb (88.5 kg)  03/19/24 196 lb 3.2 oz (89 kg)    GEN: Well nourished, well developed in no acute distress NECK: No JVD; No carotid bruits CARDIAC: ***RRR, no murmurs, rubs, gallops RESPIRATORY:  Clear to auscultation without rales, wheezing or rhonchi  ABDOMEN: Soft, non-tender, non-distended EXTREMITIES:  No edema; No acute deformity     Asessement and Plan:.     ***     Disposition: F/u with  ***  Signed, Braydn Carneiro D Kaelan Emami, NP

## 2024-04-29 ENCOUNTER — Other Ambulatory Visit (HOSPITAL_COMMUNITY): Payer: Self-pay

## 2024-04-29 ENCOUNTER — Inpatient Hospital Stay (HOSPITAL_BASED_OUTPATIENT_CLINIC_OR_DEPARTMENT_OTHER): Admitting: Hematology and Oncology

## 2024-04-29 ENCOUNTER — Encounter (HOSPITAL_BASED_OUTPATIENT_CLINIC_OR_DEPARTMENT_OTHER): Payer: Self-pay | Admitting: Orthopaedic Surgery

## 2024-04-29 ENCOUNTER — Other Ambulatory Visit: Payer: Self-pay | Admitting: Hematology and Oncology

## 2024-04-29 ENCOUNTER — Other Ambulatory Visit: Payer: Self-pay

## 2024-04-29 ENCOUNTER — Inpatient Hospital Stay: Attending: Hematology and Oncology

## 2024-04-29 VITALS — BP 108/78 | HR 83 | Temp 97.7°F | Resp 15 | Wt 194.3 lb

## 2024-04-29 DIAGNOSIS — Z86711 Personal history of pulmonary embolism: Secondary | ICD-10-CM | POA: Diagnosis present

## 2024-04-29 DIAGNOSIS — M79606 Pain in leg, unspecified: Secondary | ICD-10-CM | POA: Insufficient documentation

## 2024-04-29 DIAGNOSIS — D500 Iron deficiency anemia secondary to blood loss (chronic): Secondary | ICD-10-CM | POA: Diagnosis not present

## 2024-04-29 LAB — CBC WITH DIFFERENTIAL (CANCER CENTER ONLY)
Abs Immature Granulocytes: 0.01 10*3/uL (ref 0.00–0.07)
Basophils Absolute: 0.1 10*3/uL (ref 0.0–0.1)
Basophils Relative: 1 %
Eosinophils Absolute: 0.1 10*3/uL (ref 0.0–0.5)
Eosinophils Relative: 1 %
HCT: 40.7 % (ref 36.0–46.0)
Hemoglobin: 14 g/dL (ref 12.0–15.0)
Immature Granulocytes: 0 %
Lymphocytes Relative: 33 %
Lymphs Abs: 2.1 10*3/uL (ref 0.7–4.0)
MCH: 30.4 pg (ref 26.0–34.0)
MCHC: 34.4 g/dL (ref 30.0–36.0)
MCV: 88.5 fL (ref 80.0–100.0)
Monocytes Absolute: 0.4 10*3/uL (ref 0.1–1.0)
Monocytes Relative: 7 %
Neutro Abs: 3.6 10*3/uL (ref 1.7–7.7)
Neutrophils Relative %: 58 %
Platelet Count: 238 10*3/uL (ref 150–400)
RBC: 4.6 MIL/uL (ref 3.87–5.11)
RDW: 12.7 % (ref 11.5–15.5)
WBC Count: 6.3 10*3/uL (ref 4.0–10.5)
nRBC: 0 % (ref 0.0–0.2)

## 2024-04-29 LAB — CMP (CANCER CENTER ONLY)
ALT: 13 U/L (ref 0–44)
AST: 13 U/L — ABNORMAL LOW (ref 15–41)
Albumin: 4.3 g/dL (ref 3.5–5.0)
Alkaline Phosphatase: 39 U/L (ref 38–126)
Anion gap: 5 (ref 5–15)
BUN: 12 mg/dL (ref 6–20)
CO2: 29 mmol/L (ref 22–32)
Calcium: 9.5 mg/dL (ref 8.9–10.3)
Chloride: 103 mmol/L (ref 98–111)
Creatinine: 0.93 mg/dL (ref 0.44–1.00)
GFR, Estimated: >60 mL/min (ref >60)
Glucose, Bld: 91 mg/dL (ref 70–99)
Potassium: 4.5 mmol/L (ref 3.5–5.1)
Sodium: 137 mmol/L (ref 135–145)
Total Bilirubin: 0.5 mg/dL (ref 0.0–1.2)
Total Protein: 7.2 g/dL (ref 6.5–8.1)

## 2024-04-29 MED ORDER — APIXABAN 2.5 MG PO TABS
2.5000 mg | ORAL_TABLET | Freq: Two times a day (BID) | ORAL | 3 refills | Status: DC
Start: 2024-04-29 — End: 2024-08-22
  Filled 2024-04-29 – 2024-05-22 (×3): qty 180, 90d supply, fill #0
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
## 2024-04-30 ENCOUNTER — Encounter: Payer: Self-pay | Admitting: Hematology and Oncology

## 2024-04-30 ENCOUNTER — Ambulatory Visit (HOSPITAL_BASED_OUTPATIENT_CLINIC_OR_DEPARTMENT_OTHER): Attending: Orthopaedic Surgery

## 2024-04-30 ENCOUNTER — Encounter (HOSPITAL_BASED_OUTPATIENT_CLINIC_OR_DEPARTMENT_OTHER): Payer: Self-pay

## 2024-04-30 DIAGNOSIS — M6281 Muscle weakness (generalized): Secondary | ICD-10-CM | POA: Insufficient documentation

## 2024-04-30 DIAGNOSIS — M25611 Stiffness of right shoulder, not elsewhere classified: Secondary | ICD-10-CM | POA: Insufficient documentation

## 2024-04-30 DIAGNOSIS — M25511 Pain in right shoulder: Secondary | ICD-10-CM | POA: Diagnosis present
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
## 2024-05-01 ENCOUNTER — Ambulatory Visit: Admitting: Cardiology

## 2024-05-01 DIAGNOSIS — R002 Palpitations: Secondary | ICD-10-CM

## 2024-05-01 DIAGNOSIS — I2699 Other pulmonary embolism without acute cor pulmonale: Secondary | ICD-10-CM

## 2024-05-02 ENCOUNTER — Ambulatory Visit (HOSPITAL_BASED_OUTPATIENT_CLINIC_OR_DEPARTMENT_OTHER): Admitting: Physical Therapy

## 2024-05-06 ENCOUNTER — Encounter (HOSPITAL_BASED_OUTPATIENT_CLINIC_OR_DEPARTMENT_OTHER): Payer: Self-pay

## 2024-05-08 ENCOUNTER — Other Ambulatory Visit (HOSPITAL_COMMUNITY): Payer: Self-pay

## 2024-05-12 ENCOUNTER — Ambulatory Visit
Admission: RE | Admit: 2024-05-12 | Discharge: 2024-05-12 | Disposition: A | Discharge status: Home / Self Care | Source: Ambulatory Visit | Attending: Orthopaedic Surgery | Admitting: Orthopaedic Surgery

## 2024-05-12 DIAGNOSIS — M25551 Pain in right hip: Secondary | ICD-10-CM

## 2024-05-12 DIAGNOSIS — M25552 Pain in left hip: Secondary | ICD-10-CM

## 2024-05-14 ENCOUNTER — Encounter: Payer: Self-pay | Admitting: Internal Medicine

## 2024-05-14 ENCOUNTER — Ambulatory Visit (INDEPENDENT_AMBULATORY_CARE_PROVIDER_SITE_OTHER): Admitting: Internal Medicine

## 2024-05-14 ENCOUNTER — Other Ambulatory Visit (HOSPITAL_COMMUNITY): Payer: Self-pay

## 2024-05-14 VITALS — BP 130/88 | HR 88 | Temp 98.0°F | Ht 66.5 in

## 2024-05-14 DIAGNOSIS — M25552 Pain in left hip: Secondary | ICD-10-CM | POA: Diagnosis not present

## 2024-05-14 DIAGNOSIS — M25551 Pain in right hip: Secondary | ICD-10-CM

## 2024-05-14 DIAGNOSIS — Z7901 Long term (current) use of anticoagulants: Secondary | ICD-10-CM | POA: Insufficient documentation

## 2024-05-14 DIAGNOSIS — S73199D Other sprain of unspecified hip, subsequent encounter: Secondary | ICD-10-CM

## 2024-05-14 DIAGNOSIS — M25559 Pain in unspecified hip: Secondary | ICD-10-CM | POA: Insufficient documentation

## 2024-05-14 DIAGNOSIS — S73199A Other sprain of unspecified hip, initial encounter: Secondary | ICD-10-CM | POA: Insufficient documentation

## 2024-05-14 MED ORDER — KETOROLAC TROMETHAMINE 60 MG/2ML IM SOLN
60.0000 mg | Freq: Once | INTRAMUSCULAR | Status: AC
  Administered 2024-05-14: 60 mg via INTRAMUSCULAR

## 2024-05-14 MED ORDER — PREDNISONE 10 MG PO TABS
ORAL_TABLET | ORAL | 1 refills | Status: AC
  Filled 2024-05-14: qty 38, 17d supply, fill #0

## 2024-05-14 MED ORDER — HYDROCODONE-ACETAMINOPHEN 5-325 MG PO TABS
1.0000 | ORAL_TABLET | Freq: Four times a day (QID) | ORAL | 0 refills | Status: DC | PRN
  Filled 2024-05-14: qty 20, 5d supply, fill #0

## 2024-05-14 NOTE — Addendum Note (Signed)
 Addended by: ROSALVA LEX RAMAN on: 05/14/2024 02:55 PM   Modules accepted: Orders

## 2024-05-14 NOTE — Assessment & Plan Note (Signed)
 Severe R hip>> L hip pain x 3 weeks - pain 8/10, 10/10 w/ROM MRI w/B hip labrum tear Not sleeping, no injury Prednisone  10 mg: take 4 tabs a day x 3 days; then 3 tabs a day x 4 days; then 2 tabs a day x 4 days, then 1 tab a day x 6 days, then stop. Take pc. On Prilosec . On Eliquis  Norco prn  Potential benefits of a short term opioids use as well as potential risks (i.e. addiction risk, apnea etc) and complications (i.e. Somnolence, constipation and others) were explained to the patient and were aknowledged. Toradol  60 mg IM now F/u w/Dr Genelle next week Steroid hip inj may be an option

## 2024-05-14 NOTE — Progress Notes (Signed)
 Subjective:  Patient ID: Hadassah Clemencia Bay, female    DOB: 11-10-1985  Age: 38 y.o. MRN: [id number]  CC: Hip Pain (Right side, possibly both )   HPI Hadassah Clemencia Scruggs-Hernandez presents for R hip>> L hip pain x 3 weeks - pain 8/10 10/10 w/ROM. Not sleeping, no injury MRI w/B hip labrum tear    Outpatient Medications Prior to Visit  Medication Sig Dispense Refill   albuterol  (PROVENTIL ) (2.5 MG/3ML) 0.083% nebulizer solution Take 3 mLs (2.5 mg total) by nebulization every 6 (six) hours as needed for wheezing or shortness of breath. 180 mL 1   albuterol  (VENTOLIN  HFA) 108 (90 Base) MCG/ACT inhaler Inhale 2 puffs into the lungs every 4 (four) hours as needed for wheezing or shortness of breath. 18 g 6   apixaban  (ELIQUIS ) 2.5 MG TABS tablet Take 1 tablet (2.5 mg total) by mouth 2 (two) times daily. 180 tablet 3   budesonide -formoterol  (SYMBICORT ) 160-4.5 MCG/ACT inhaler Inhale 2 puffs into the lungs 2 (two) times daily. 10.2 g 2   cyclobenzaprine  (FLEXERIL ) 10 MG tablet Take 1 tablet (10 mg total) by mouth every 8 (eight) hours as needed for muscle spasms. 60 tablet 0   desloratadine  (CLARINEX ) 5 MG tablet Take 1 tablet (5 mg total) by mouth daily. 90 tablet 3   ferrous sulfate  325 (65 FE) MG tablet Take 1 tablet (325 mg total) by mouth daily with breakfast. Please take with a source of Vitamin C 90 tablet 3   Galcanezumab -gnlm (EMGALITY ) 120 MG/ML SOAJ Inject 120 mg into the skin every 30 days. 3 mL 3   loratadine  (CLARITIN ) 10 MG tablet Take 1 tablet (10 mg total) by mouth daily. 30 tablet 3   omeprazole  (PRILOSEC  OTC) 20 MG tablet Take 1 tablet (20 mg total) by mouth daily. 30 tablet 3   Ubrogepant  (UBRELVY ) 100 MG TABS Take 1 tablet (100 mg) by mouth daily as needed. 8 tablet 11   predniSONE  (DELTASONE ) 20 MG tablet Take 2 tablets (40 mg total) by mouth daily with breakfast. 10 tablet 0   HYDROcodone  bit-homatropine (HYCODAN) 5-1.5 MG/5ML syrup Take 5 mLs by mouth every 8  (eight) hours as needed for cough. 120 mL 0   No facility-administered medications prior to visit.    ROS: Review of Systems  Constitutional:  Negative for activity change, appetite change, chills, fatigue and unexpected weight change.  HENT:  Negative for congestion, mouth sores and sinus pressure.   Eyes:  Negative for visual disturbance.  Respiratory:  Negative for cough and chest tightness.   Gastrointestinal:  Negative for abdominal pain and nausea.  Genitourinary:  Negative for difficulty urinating, frequency and vaginal pain.  Musculoskeletal:  Negative for back pain and gait problem.  Skin:  Negative for pallor and rash.  Neurological:  Negative for dizziness, tremors, weakness, numbness and headaches.  Psychiatric/Behavioral:  Negative for confusion and sleep disturbance.     Objective:  BP 130/88 (BP Location: Left Arm, Patient Position: Sitting, Cuff Size: Normal)   Pulse 88   Temp 98 F (36.7 C) (Oral)   Ht 5' 6.5 (1.689 m)   LMP  (LMP Unknown)   SpO2 97%   BMI 30.89 kg/m   BP Readings from Last 3 Encounters:  05/14/24 130/88  04/29/24 108/78  03/29/24 112/82    Wt Readings from Last 3 Encounters:  04/29/24 194 lb 4.8 oz (88.1 kg)  03/21/24 195 lb (88.5 kg)  03/19/24 196 lb 3.2 oz (89 kg)    Physical  Exam Constitutional:      General: She is not in acute distress.    Appearance: She is well-developed.  HENT:     Head: Normocephalic.     Right Ear: External ear normal.     Left Ear: External ear normal.     Nose: Nose normal.  Eyes:     General:        Right eye: No discharge.        Left eye: No discharge.     Conjunctiva/sclera: Conjunctivae normal.     Pupils: Pupils are equal, round, and reactive to light.  Neck:     Thyroid: No thyromegaly.     Vascular: No JVD.     Trachea: No tracheal deviation.  Cardiovascular:     Rate and Rhythm: Normal rate and regular rhythm.     Heart sounds: Normal heart sounds.  Pulmonary:     Effort: No  respiratory distress.     Breath sounds: No stridor. No wheezing.  Abdominal:     General: Bowel sounds are normal. There is no distension.     Palpations: Abdomen is soft. There is no mass.     Tenderness: There is no abdominal tenderness. There is no guarding or rebound.  Musculoskeletal:        General: Tenderness present. No deformity.     Cervical back: Normal range of motion and neck supple. No rigidity.     Right lower leg: No edema.     Left lower leg: No edema.  Lymphadenopathy:     Cervical: No cervical adenopathy.  Skin:    Findings: No erythema or rash.  Neurological:     Mental Status: She is oriented to person, place, and time.     Cranial Nerves: No cranial nerve deficit.     Motor: No abnormal muscle tone.     Coordination: Coordination normal.     Deep Tendon Reflexes: Reflexes normal.  Psychiatric:        Behavior: Behavior normal.        Thought Content: Thought content normal.        Judgment: Judgment normal.   R hip - severe pain w/any ROM Str leg elev (-) B Knees OK Lat hips ok   Lab Results  Component Value Date   WBC 6.3 04/29/2024   HGB 14.0 04/29/2024   HCT 40.7 04/29/2024   PLT 238 04/29/2024   GLUCOSE 91 04/29/2024   CHOL 231 (H) 03/06/2024   TRIG 99 03/06/2024   HDL 65 03/06/2024   LDLCALC 149 (H) 03/06/2024   ALT 13 04/29/2024   AST 13 (L) 04/29/2024   NA 137 04/29/2024   K 4.5 04/29/2024   CL 103 04/29/2024   CREATININE 0.93 04/29/2024   BUN 12 04/29/2024   CO2 29 04/29/2024   TSH 0.824 03/19/2024   INR 1.5 (H) 10/31/2019   HGBA1C 4.7 (L) 01/25/2024    MR HIP RIGHT WO CONTRAST Result Date: 05/13/2024 CLINICAL DATA:  Bilateral hip pain. EXAM: MR OF THE RIGHT HIP WITHOUT CONTRAST TECHNIQUE: Multiplanar, multisequence MR imaging was performed. No intravenous contrast was administered. COMPARISON:  None Available. FINDINGS: Bones: No hip fracture, dislocation or avascular necrosis. No periosteal reaction or bone destruction. No  aggressive osseous lesion. Normal sacrum and sacroiliac joints. No SI joint widening or erosive changes. Articular cartilage and labrum Articular cartilage:  No chondral defect. Labrum:  Nondisplaced right anterosuperior labral tear. Joint or bursal effusion Joint effusion:  No hip joint effusion.  No SI joint effusion. Bursae:  No bursal fluid. Muscles and tendons Flexors: Normal. Extensors: Normal. Abductors: Normal. Adductors: Normal. Gluteals: Normal. Hamstrings: Normal. Other findings No pelvic free fluid. No fluid collection or hematoma. No inguinal lymphadenopathy. No inguinal hernia. IMPRESSION: 1. Nondisplaced right anterosuperior labral tear. No focal chondral abnormality. 2. No hip fracture, dislocation or avascular necrosis. Electronically Signed   By: Julaine Blanch M.D.   On: 05/13/2024 08:24   MR HIP LEFT WO CONTRAST Result Date: 05/13/2024 CLINICAL DATA:  Bilateral hip pain. EXAM: MR OF THE LEFT HIP WITHOUT CONTRAST TECHNIQUE: Multiplanar, multisequence MR imaging was performed. No intravenous contrast was administered. COMPARISON:  None Available. FINDINGS: Bones: No hip fracture, dislocation or avascular necrosis. No periosteal reaction or bone destruction. No aggressive osseous lesion. Normal sacrum and sacroiliac joints. No SI joint widening or erosive changes. Articular cartilage and labrum Articular cartilage:  No chondral defect. Labrum:  Nondisplaced left anterosuperior labral tear. Joint or bursal effusion Joint effusion:  No hip joint effusion.  No SI joint effusion. Bursae:  No bursal fluid. Muscles and tendons Flexors: Normal. Extensors: Normal. Abductors: Normal. Adductors: Normal. Gluteals: Normal. Hamstrings: Normal. Other findings No pelvic free fluid. No fluid collection or hematoma. No inguinal lymphadenopathy. No inguinal hernia. IMPRESSION: 1. Nondisplaced left anterosuperior labral tear. No focal chondral abnormality. 2. No hip fracture, dislocation or avascular necrosis.  Electronically Signed   By: Julaine Blanch M.D.   On: 05/13/2024 08:22    Assessment & Plan:   Problem List Items Addressed This Visit     Hip pain - Primary   Severe R hip>> L hip pain x 3 weeks - pain 8/10, 10/10 w/ROM MRI w/B hip labrum tear Not sleeping, no injury Prednisone  10 mg: take 4 tabs a day x 3 days; then 3 tabs a day x 4 days; then 2 tabs a day x 4 days, then 1 tab a day x 6 days, then stop. Take pc. On Prilosec . On Eliquis  Norco prn  Potential benefits of a short term opioids use as well as potential risks (i.e. addiction risk, apnea etc) and complications (i.e. Somnolence, constipation and others) were explained to the patient and were aknowledged. Toradol  60 mg IM now F/u w/Dr Genelle next week Steroid hip inj may be an option       Labral tear of hip joint   Severe R hip>> L hip pain x 3 weeks - pain 8/10, 10/10 w/ROM MRI w/B hip labrum tear Not sleeping, no injury Prednisone  10 mg: take 4 tabs a day x 3 days; then 3 tabs a day x 4 days; then 2 tabs a day x 4 days, then 1 tab a day x 6 days, then stop. Take pc. On Prilosec . On Eliquis  Norco prn  Potential benefits of a short term opioids use as well as potential risks (i.e. addiction risk, apnea etc) and complications (i.e. Somnolence, constipation and others) were explained to the patient and were aknowledged. Toradol  60 mg IM now F/u w/Dr Genelle next week Steroid hip inj may be an option      Anticoagulated   Acknowledged. On Prilosec  to protect the stomach. On Eliquis          Meds ordered this encounter  Medications   predniSONE  (DELTASONE ) 10 MG tablet    Sig: Prednisone  10 mg: take 4 tabs a day x 3 days; then 3 tabs a day x 4 days; then 2 tabs a day x 4 days, then 1 tab a day x 6  days, then stop. Take pc.    Dispense:  38 tablet    Refill:  1   HYDROcodone -acetaminophen  (NORCO/VICODIN) 5-325 MG tablet    Sig: Take 1 tablet by mouth every 6 (six) hours as needed.    Dispense:  20 tablet    Refill:   0      Follow-up: No follow-ups on file.  Marolyn Noel, MD

## 2024-05-14 NOTE — Assessment & Plan Note (Signed)
 Acknowledged. On Prilosec  to protect the stomach. On Eliquis 

## 2024-05-16 ENCOUNTER — Other Ambulatory Visit (HOSPITAL_COMMUNITY): Payer: Self-pay

## 2024-05-16 ENCOUNTER — Other Ambulatory Visit: Payer: Self-pay

## 2024-05-17 IMAGING — MR MR LUMBAR SPINE W/O CM
4 of 5 series · 26 of 48 positions shown · non-contrast
Comparison: Lumbar radiographs January 31, 2022.

CLINICAL DATA: Myelopathy, acute, lumbar spine

EXAM:
MRI LUMBAR SPINE WITHOUT CONTRAST
TECHNIQUE: Multiplanar, multisequence MR imaging of the lumbar spine was
performed. No intravenous contrast was administered.

[Series 3: T2 · sagittal · 4.0mm · 1.09mm/px · 5 of 15 slices shown (1 of 2)]
[im 1/15]
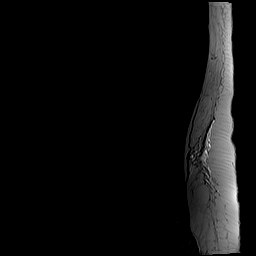
[im 4/15]
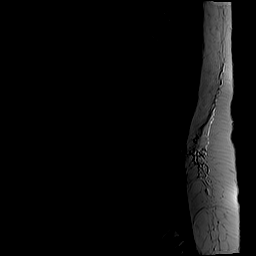
[im 8/15]
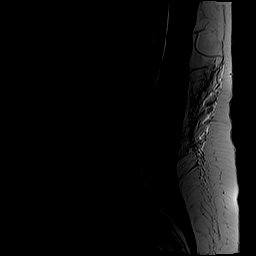
[im 11/15]
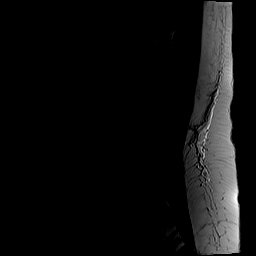
[im 15/15]
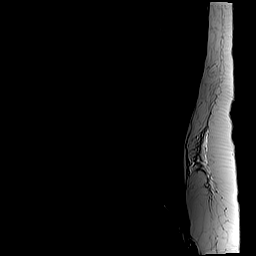

[Series 5: T1 · sagittal · 4.0mm · 1.09mm/px · 6 of 15 slices shown (1 of 2)]
[im 1/15]
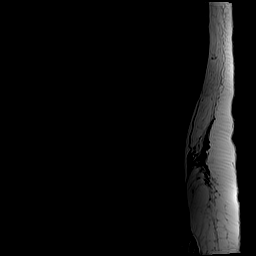
[im 3/15]
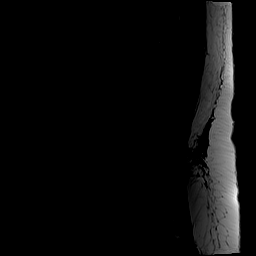
[im 6/15]
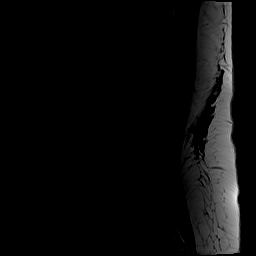
[im 9/15]
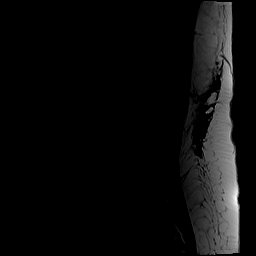
[im 12/15]
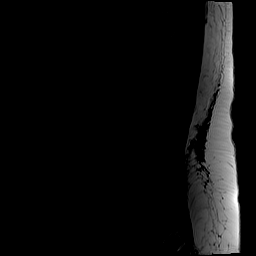
[im 15/15]
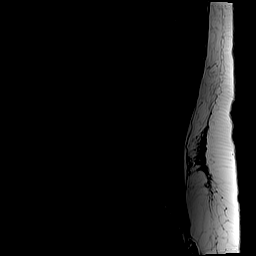

[Series 6: T2 · axial · 4.0mm · 0.39mm/px · z∈[-100,+121]mm · 10 of 42 slices shown (2 of 2)]
[im 3/42]
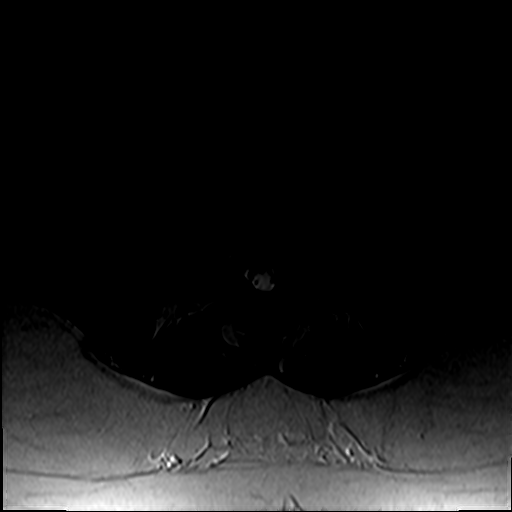
[im 6/42]
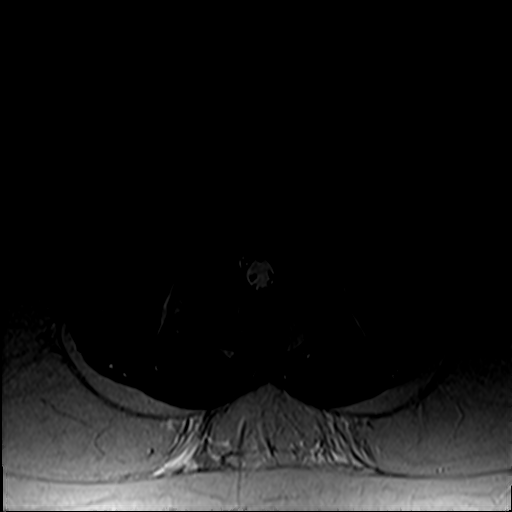
[im 9/42]
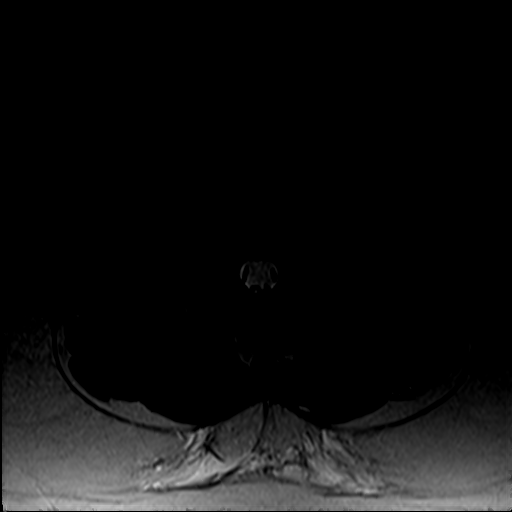
[im 14/42]
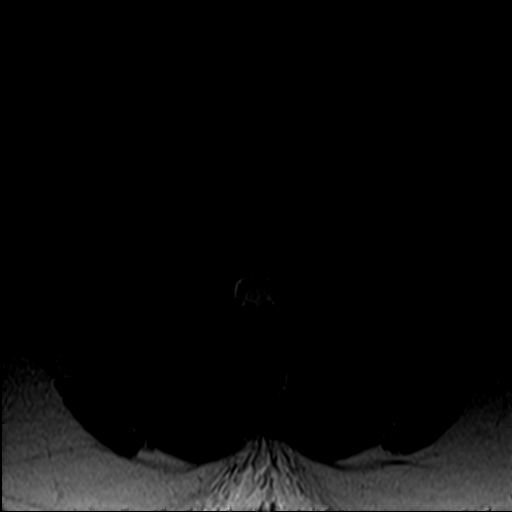
[im 20/42]
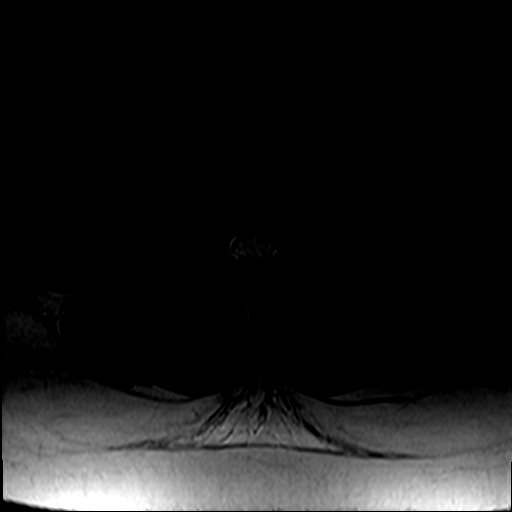
[im 22/42]
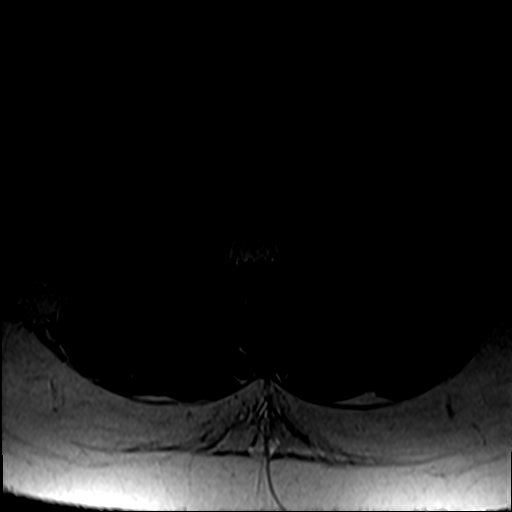
[im 25/42]
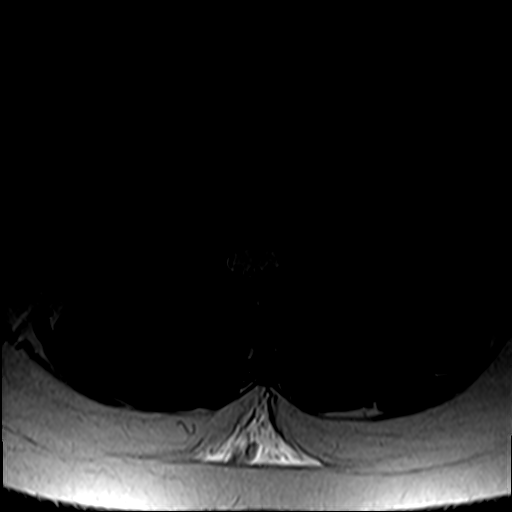
[im 31/42]
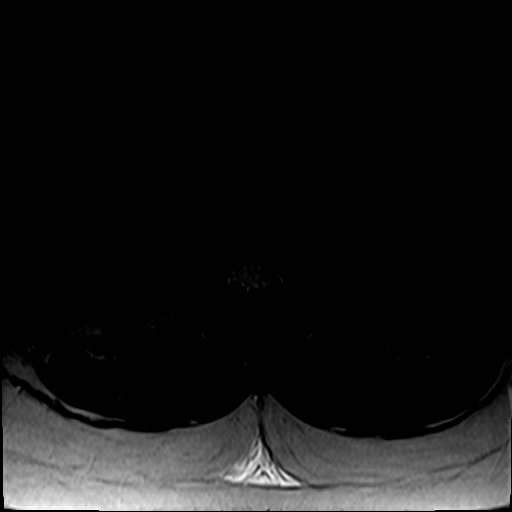
[im 36/42]
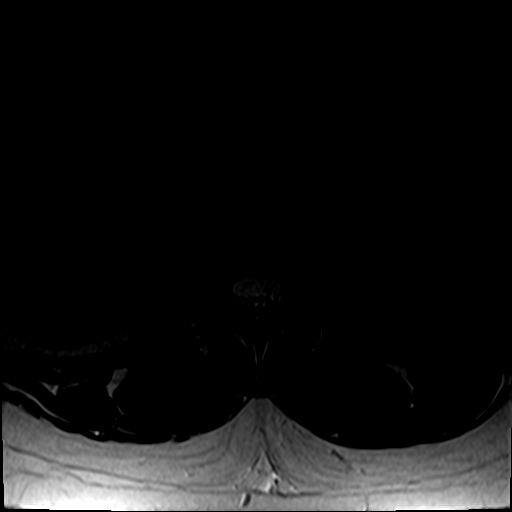
[im 42/42]
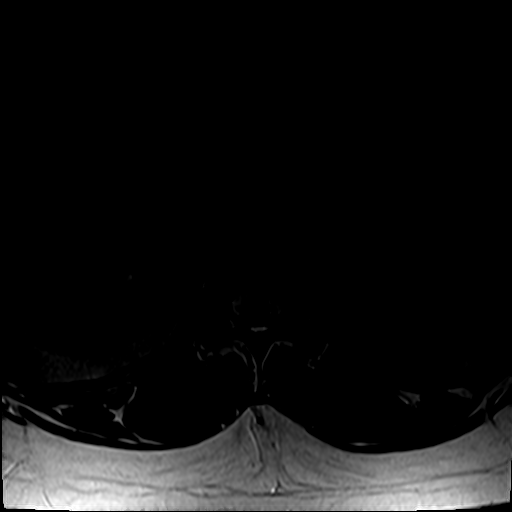

[Series 7: T1 · axial · 4.0mm · 0.39mm/px · z∈[-100,+93]mm · 5 of 42 slices shown (2 of 2)]
[im 3/42]
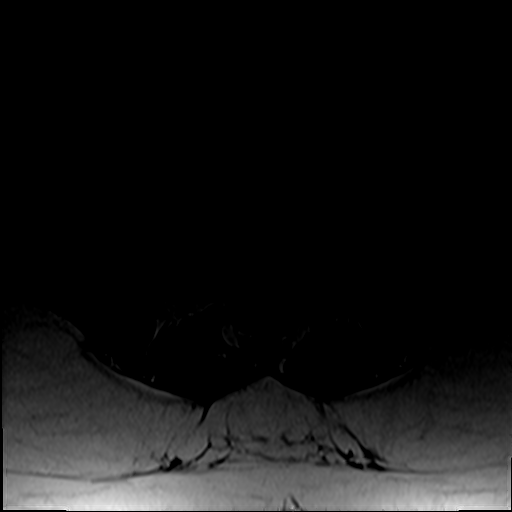
[im 6/42]
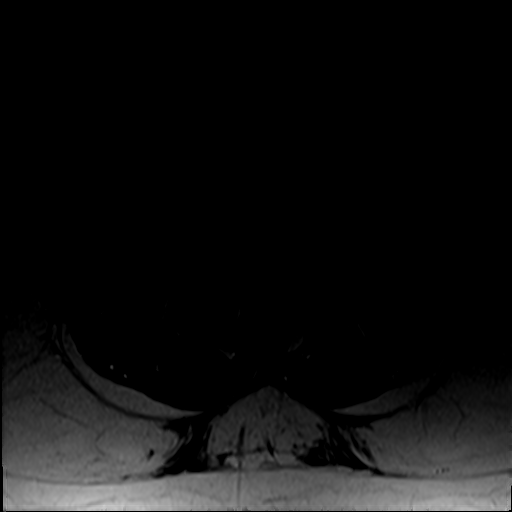
[im 9/42]
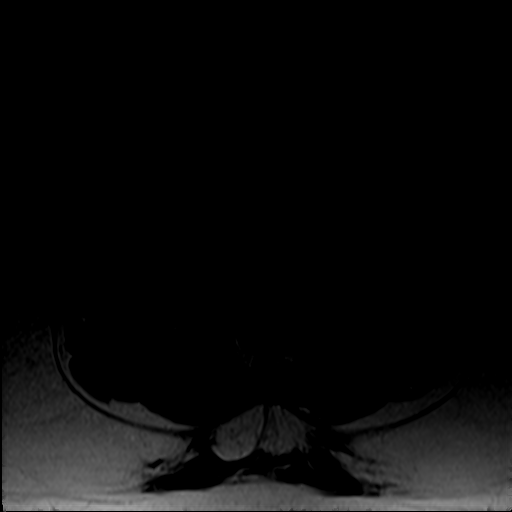
[im 22/42]
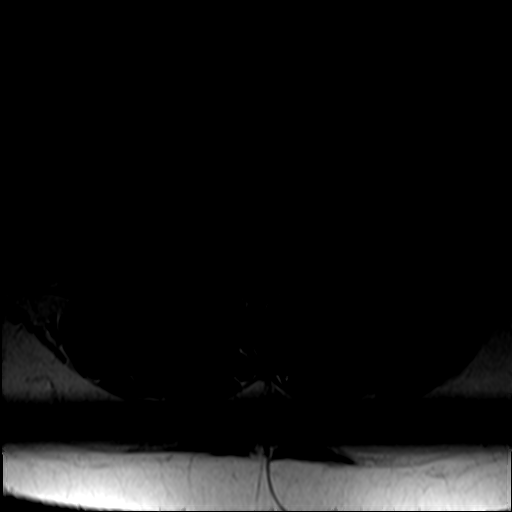
[im 36/42]
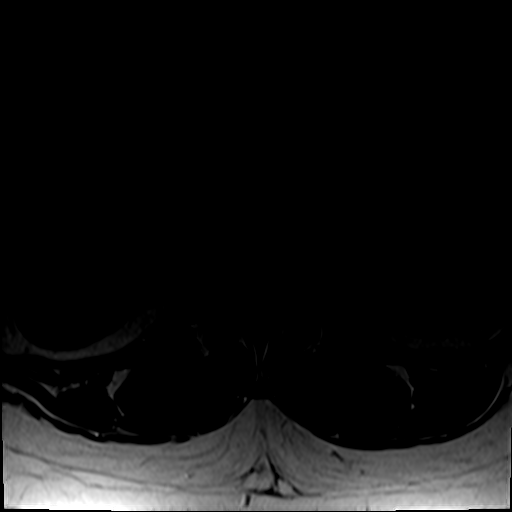

[26 of 48 positions shown; findings below may reference images not displayed]

FINDINGS: Segmentation: Standard segmentation is assumed. The inferior-most
fully formed intervertebral disc is labeled L5-S1.

Alignment:  No substantial sagittal subluxation.

Vertebrae: Vertebral body heights are maintained. No focal marrow
edema to suggest acute fracture or discitis/osteomyelitis. No
suspicious lesions. Benign vertebral venous malformation at T11,
partially imaged.

Conus medullaris and cauda equina: Conus extends to the superior L1
level. Conus appears normal.

Paraspinal and other soft tissues: Unremarkable.

Disc levels:

T12-L1: No significant disc protrusion, foraminal stenosis, or canal
stenosis.

L1-L2: No significant disc protrusion, foraminal stenosis, or canal
stenosis.

L2-L3: No significant disc protrusion, foraminal stenosis, or canal
stenosis.

L3-L4: No significant disc protrusion, foraminal stenosis, or canal
stenosis.

L4-L5: Mild disc bulging and mild facet arthropathy. No significant
canal or foraminal stenosis.

L5-S1: Mild disc desiccation and height loss. Mild disc bulging and
mild facet arthropathy without significant canal or foraminal
stenosis.
IMPRESSION: Mild lower lumbar degenerative change without significant canal or
foraminal stenosis.
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
## 2024-05-21 NOTE — Patient Instructions (Incomplete)

## 2024-05-22 ENCOUNTER — Ambulatory Visit: Payer: Medicaid Other | Admitting: Family Medicine

## 2024-05-22 ENCOUNTER — Ambulatory Visit (HOSPITAL_BASED_OUTPATIENT_CLINIC_OR_DEPARTMENT_OTHER): Payer: Self-pay | Admitting: Orthopaedic Surgery

## 2024-05-22 ENCOUNTER — Ambulatory Visit (HOSPITAL_BASED_OUTPATIENT_CLINIC_OR_DEPARTMENT_OTHER): Admitting: Orthopaedic Surgery

## 2024-05-22 ENCOUNTER — Encounter: Payer: Self-pay | Admitting: Hematology and Oncology

## 2024-05-22 ENCOUNTER — Other Ambulatory Visit (HOSPITAL_COMMUNITY): Payer: Self-pay

## 2024-05-22 ENCOUNTER — Other Ambulatory Visit (HOSPITAL_BASED_OUTPATIENT_CLINIC_OR_DEPARTMENT_OTHER): Payer: Self-pay

## 2024-05-22 DIAGNOSIS — M25551 Pain in right hip: Secondary | ICD-10-CM | POA: Diagnosis not present

## 2024-05-22 DIAGNOSIS — G43709 Chronic migraine without aura, not intractable, without status migrainosus: Secondary | ICD-10-CM

## 2024-05-22 MED ORDER — OXYCODONE HCL 5 MG PO TABS
5.0000 mg | ORAL_TABLET | ORAL | 0 refills | Status: DC | PRN
  Filled 2024-05-22: qty 10, 2d supply, fill #0

## 2024-05-22 MED ORDER — IBUPROFEN 800 MG PO TABS
800.0000 mg | ORAL_TABLET | Freq: Three times a day (TID) | ORAL | 0 refills | Status: AC
  Filled 2024-05-22: qty 30, 10d supply, fill #0

## 2024-05-22 MED ORDER — ACETAMINOPHEN 500 MG PO TABS
500.0000 mg | ORAL_TABLET | Freq: Three times a day (TID) | ORAL | 0 refills | Status: AC
  Filled 2024-05-22: qty 30, 10d supply, fill #0
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
## 2024-05-23 ENCOUNTER — Encounter: Payer: Self-pay | Admitting: Family Medicine

## 2024-05-23 ENCOUNTER — Other Ambulatory Visit (HOSPITAL_COMMUNITY): Payer: Self-pay

## 2024-05-23 ENCOUNTER — Encounter (HOSPITAL_BASED_OUTPATIENT_CLINIC_OR_DEPARTMENT_OTHER): Payer: Self-pay

## 2024-05-23 ENCOUNTER — Ambulatory Visit: Admitting: Family Medicine

## 2024-05-23 VITALS — BP 115/75 | HR 97 | Ht 66.0 in | Wt 197.2 lb

## 2024-05-23 DIAGNOSIS — G43709 Chronic migraine without aura, not intractable, without status migrainosus: Secondary | ICD-10-CM

## 2024-05-23 MED ORDER — NURTEC 75 MG PO TBDP
75.0000 mg | ORAL_TABLET | Freq: Every day | ORAL | 11 refills | Status: AC | PRN
  Filled 2024-05-23: qty 8, 30d supply, fill #0

## 2024-05-23 NOTE — Patient Instructions (Addendum)
 Below is our plan:  We will continue Emgality  every 30 days. Switch Ubrelvy  to Nurtec as needed.   Please make sure you are staying well hydrated. I recommend 50-60 ounces daily. Well balanced diet and regular exercise encouraged. Consistent sleep schedule with 6-8 hours recommended.   Please continue follow up with care team as directed.   Follow up with me for Botox  in August   You may receive a survey regarding today's visit. I encourage you to leave honest feed back as I do use this information to improve patient care. Thank you for seeing me today!   GENERAL HEADACHE INFORMATION:   Natural supplements: Magnesium  Oxide or Magnesium  Glycinate 500 mg at bed (up to 800 mg daily) Coenzyme Q10 300 mg in AM Vitamin B2- 200 mg twice a day   Add 1 supplement at a time since even natural supplements can have undesirable side effects. You can sometimes buy supplements cheaper (especially Coenzyme Q10) at www.WebmailGuide.co.za or at [facility].  Migraine with aura: There is increased risk for stroke in women with migraine with aura and a contraindication for the combined contraceptive pill for use by women who have migraine with aura. The risk for women with migraine without aura is lower. However other risk factors like smoking are far more likely to increase stroke risk than migraine. There is a recommendation for no smoking and for the use of OCPs without estrogen such as progestogen only pills particularly for women with migraine with aura.SABRA People who have migraine headaches with auras may be 3 times more likely to have a stroke caused by a blood clot, compared to migraine patients who don't see auras. Women who take hormone-replacement therapy may be 30 percent more likely to suffer a clot-based stroke than women not taking medication containing estrogen. Other risk factors like smoking and high blood pressure may be  much more important.    Vitamins and herbs that show potential:   Magnesium :  Magnesium  (250 mg twice a day or 500 mg at bed) has a relaxant effect on smooth muscles such as blood vessels. Individuals suffering from frequent or daily headache usually have low magnesium  levels which can be increase with daily supplementation of 400-750 mg. Three trials found 40-90% average headache reduction  when used as a preventative. Magnesium  may help with headaches are aura, the best evidence for magnesium  is for migraine with aura is its thought to stop the cortical spreading depression we believe is the pathophysiology of migraine aura.Magnesium  also demonstrated the benefit in menstrually related migraine.  Magnesium  is part of the messenger system in the serotonin cascade and it is a good muscle relaxant.  It is also useful for constipation which can be a side effect of other medications used to treat migraine. Good sources include nuts, whole grains, and tomatoes. Side Effects: loose stool/diarrhea  Riboflavin (vitamin B 2) 200 mg twice a day. This vitamin assists nerve cells in the production of ATP a principal energy storing molecule.  It is necessary for many chemical reactions in the body.  There have been at least 3 clinical trials of riboflavin using 400 mg per day all of which suggested that migraine frequency can be decreased.  All 3 trials showed significant improvement in over half of migraine sufferers.  The supplement is found in bread, cereal, milk, meat, and poultry.  Most Americans get more riboflavin than the recommended daily allowance, however riboflavin deficiency is not necessary for the supplements to help prevent headache. Side effects: energizing,  green urine   Coenzyme Q10: This is present in almost all cells in the body and is critical component for the conversion of energy.  Recent studies have shown that a nutritional supplement of CoQ10 can reduce the frequency of migraine attacks by improving the energy production of cells as with riboflavin.  Doses of 150 mg twice a  day have been shown to be effective.   Melatonin: Increasing evidence shows correlation between melatonin secretion and headache conditions.  Melatonin supplementation has decreased headache intensity and duration.  It is widely used as a sleep aid.  Sleep is natures way of dealing with migraine.  A dose of 3 mg is recommended to start for headaches including cluster headache. Higher doses up to 15 mg has been reviewed for use in Cluster headache and have been used. The rationale behind using melatonin for cluster is that many theories regarding the cause of Cluster headache center around the disruption of the normal circadian rhythm in the brain.  This helps restore the normal circadian rhythm.   HEADACHE DIET: Foods and beverages which may trigger migraine Note that only 20% of headache patients are food sensitive. You will know if you are food sensitive if you get a headache consistently 20 minutes to 2 hours after eating a certain food. Only cut out a food if it causes headaches, otherwise you might remove foods you enjoy! What matters most for diet is to eat a well balanced healthy diet full of vegetables and low fat protein, and to not miss meals.   Chocolate, other sweets ALL cheeses except cottage and cream cheese Dairy products, yogurt, sour cream, ice cream Liver Meat extracts (Bovril, Marmite, meat tenderizers) Meats or fish which have undergone aging, fermenting, pickling or smoking. These include: Hotdogs,salami,Lox,sausage, mortadellas,smoked salmon, pepperoni, Pickled herring Pods of broad bean (English beans, Chinese pea pods, [place] (fava) beans, lima and navy beans Ripe avocado, ripe banana Yeast extracts or active yeast preparations such as Brewer's or Fleishman's (commercial bakes goods are permitted) Tomato based foods, pizza (lasagna, etc.)   MSG (monosodium glutamate) is disguised as many things; look for these common aliases: Monopotassium glutamate Autolysed  yeast Hydrolysed protein Sodium caseinate "flavorings" "all natural preservatives Nutrasweet   Avoid all other foods that convincingly provoke headaches.   Resources: The Dizzy Bluford Aid Your Headache Diet, migrainestrong.com  https://zamora-andrews.com/   Caffeine and Migraine For patients that have migraine, caffeine intake more than 3 days per week can lead to dependency and increased migraine frequency. I would recommend cutting back on your caffeine intake as best you can. The recommended amount of caffeine is 200-300 mg daily, although migraine patients may experience dependency at even lower doses. While you may notice an increase in headache temporarily, cutting back will be helpful for headaches in the long run. For more information on caffeine and migraine, visit: https://americanmigrainefoundation.org/resource-library/caffeine-and-migraine/   Headache Prevention Strategies:   1. Maintain a headache diary; learn to identify and avoid triggers.  - This can be a simple note where you log when you had a headache, associated symptoms, and medications used - There are several smartphone apps developed to help track migraines: Migraine Buddy, Migraine Monitor, Curelator N1-Headache App   Common triggers include: Emotional triggers: Emotional/Upset family or friends Emotional/Upset occupation Business reversal/success Anticipation anxiety Crisis-serious Post-crisis periodNew job/position   Physical triggers: Vacation Day Weekend Strenuous Exercise High Altitude Location New Move Menstrual Day Physical Illness Oversleep/Not enough sleep Weather changes Light: Photophobia or light sesnitivity treatment involves a balance  between desensitization and reduction in overly strong input. Use dark polarized glasses outside, but not inside. Avoid bright or fluorescent light, but do not dim environment to the point that going into a  normally lit room hurts. Consider FL-41 tint lenses, which reduce the most irritating wavelengths without blocking too much light.  These can be obtained at axonoptics.com or theraspecs.com Foods: see list above.   2. Limit use of acute treatments (over-the-counter medications, triptans, etc.) to no more than 2 days per week or 10 days per month to prevent medication overuse headache (rebound headache).     3. Follow a regular schedule (including weekends and holidays): Don't skip meals. Eat a balanced diet. 8 hours of sleep nightly. Minimize stress. Exercise 30 minutes per day. Being overweight is associated with a 5 times increased risk of chronic migraine. Keep well hydrated and drink 6-8 glasses of water per day.   4. Initiate non-pharmacologic measures at the earliest onset of your headache. Rest and quiet environment. Relax and reduce stress. Breathe2Relax is a free app that can instruct you on    some simple relaxtion and breathing techniques. Http://Dawnbuse.com is a    free website that provides teaching videos on relaxation.  Also, there are  many apps that   can be downloaded for "mindful" relaxation.  An app called YOGA NIDRA will help walk you through mindfulness. Another app called Calm can be downloaded to give you a structured mindfulness guide with daily reminders and skill development. Headspace for guided meditation Mindfulness Based Stress Reduction Online Course: www.palousemindfulness.com Cold compresses.   5. Don't wait!! Take the maximum allowable dosage of prescribed medication at the first sign of migraine.   6. Compliance:  Take prescribed medication regularly as directed and at the first sign of a migraine.   7. Communicate:  Call your physician when problems arise, especially if your headaches change, increase in frequency/severity, or become associated with neurological symptoms (weakness, numbness, slurred speech, etc.). Proceed to emergency room if you experience  new or worsening symptoms or symptoms do not resolve, if you have new neurologic symptoms or if headache is severe, or for any concerning symptom.   8. Headache/pain management therapies: Consider various complementary methods, including medication, behavioral therapy, psychological counselling, biofeedback, massage therapy, acupuncture, dry needling, and other modalities.  Such measures may reduce the need for medications. Counseling for pain management, where patients learn to function and ignore/minimize their pain, seems to work very well.   9. Recommend changing family's attention and focus away from patient's headaches. Instead, emphasize daily activities. If first question of day is 'How are your headaches/Do you have a headache today?', then patient will constantly think about headaches, thus making them worse. Goal is to re-direct attention away from headaches, toward daily activities and other distractions.   10. Helpful Websites: www.AmericanHeadacheSociety.org PatentHood.ch www.headaches.org TightMarket.nl www.achenet.org
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
## 2024-05-24 ENCOUNTER — Other Ambulatory Visit (HOSPITAL_COMMUNITY): Payer: Self-pay

## 2024-05-24 DIAGNOSIS — Z0289 Encounter for other administrative examinations: Secondary | ICD-10-CM

## 2024-05-28 ENCOUNTER — Telehealth: Payer: Self-pay

## 2024-05-28 ENCOUNTER — Encounter: Payer: Self-pay | Admitting: Hematology and Oncology

## 2024-05-28 ENCOUNTER — Other Ambulatory Visit (HOSPITAL_COMMUNITY): Payer: Self-pay

## 2024-05-28 NOTE — Telephone Encounter (Signed)
 Pharmacy Patient Advocate Encounter   Received notification from Fax that prior authorization for Nurtec 75MG  dispersible tablets is required/requested.   Insurance verification completed.   The patient is insured through [facility] [place] .   Per test claim: PA required; PA submitted to above mentioned insurance via CoverMyMeds Key/confirmation #/EOC [facility] Status is pending

## 2024-05-29 NOTE — Progress Notes (Signed)
 06/10/24 ALL: Meghan Reed returns for Botox . She continues Emgality  monthly. We switched Ubrelvy  to Nurtec recently due to intractable migraines. She has used samples that were very effective. Rx has not been approved yet. She reports less migraines since weather has cooled off.   03/11/2024 ALL: Meghan Reed presents for second Botox . She reports initial improvement in headache days after last procedure but then had shoulder surgery resulting complicated by pulmonary embolism. Now on lifelong anticoagulation. She tolerated procedure well. Ubrelvy  and Advil  help with abortive therapy.   12/05/2023 ALL: Meghan Reed presents for first Botox  procedure. She continues Emgality  and Ubrelvy . She averages at least 10-15 migraine days a month. She gets some relief with Ubrelvy  and Advil .     Consent Form Botulism Toxin Injection For Chronic Migraine    Reviewed orally with patient, additionally signature is on file:  Botulism toxin has been approved by the Federal drug administration for treatment of chronic migraine. Botulism toxin does not cure chronic migraine and it may not be effective in some patients.  The administration of botulism toxin is accomplished by injecting a small amount of toxin into the muscles of the neck and head. Dosage must be titrated for each individual. Any benefits resulting from botulism toxin tend to wear off after 3 months with a repeat injection required if benefit is to be maintained. Injections are usually done every 3-4 months with maximum effect peak achieved by about 2 or 3 weeks. Botulism toxin is expensive and you should be sure of what costs you will incur resulting from the injection.  The side effects of botulism toxin use for chronic migraine may include:   -Transient, and usually mild, facial weakness with facial injections  -Transient, and usually mild, head or neck weakness with head/neck injections  -Reduction or loss of forehead facial animation due to forehead muscle  weakness  -Eyelid drooping  -Dry eye  -Pain at the site of injection or bruising at the site of injection  -Double vision  -Potential unknown long term risks   Contraindications: You should not have Botox  if you are pregnant, nursing, allergic to albumin, have an infection, skin condition, or muscle weakness at the site of the injection, or have myasthenia gravis, Lambert-Eaton syndrome, or ALS.  It is also possible that as with any injection, there may be an allergic reaction or no effect from the medication. Reduced effectiveness after repeated injections is sometimes seen and rarely infection at the injection site may occur. All care will be taken to prevent these side effects. If therapy is given over a long time, atrophy and wasting in the muscle injected may occur. Occasionally the patient's become refractory to treatment because they develop antibodies to the toxin. In this event, therapy needs to be modified.  I have read the above information and consent to the administration of botulism toxin.    BOTOX  PROCEDURE NOTE FOR MIGRAINE HEADACHE  Contraindications and precautions discussed with patient(above). Aseptic procedure was observed and patient tolerated procedure. Procedure performed by Greig Forbes, FNP-C.   The condition has existed for more than 6 months, and pt does not have a diagnosis of ALS, Myasthenia Gravis or Lambert-Eaton Syndrome.  Risks and benefits of injections discussed and pt agrees to proceed with the procedure.  Written consent obtained  These injections are medically necessary. Pt  receives good benefits from these injections. These injections do not cause sedations or hallucinations which the oral therapies may cause.   Description of procedure:  The patient was placed  in a sitting position. The standard protocol was used for Botox  as follows, with 5 units of Botox  injected at each site:  -Procerus muscle, midline injection  -Corrugator muscle, bilateral  injection  -Frontalis muscle, bilateral injection, with 2 sites each side, medial injection was performed in the upper one third of the frontalis muscle, in the region vertical from the medial inferior edge of the superior orbital rim. The lateral injection was again in the upper one third of the forehead vertically above the lateral limbus of the cornea, 1.5 cm lateral to the medial injection site.  -Temporalis muscle injection, 4 sites, bilaterally. The first injection was 3 cm above the tragus of the ear, second injection site was 1.5 cm to 3 cm up from the first injection site in line with the tragus of the ear. The third injection site was 1.5-3 cm forward between the first 2 injection sites. The fourth injection site was 1.5 cm posterior to the second injection site. 5th site laterally in the temporalis  muscleat the level of the outer canthus.  -Occipitalis muscle injection, 3 sites, bilaterally. The first injection was done one half way between the occipital protuberance and the tip of the mastoid process behind the ear. The second injection site was done lateral and superior to the first, 1 fingerbreadth from the first injection. The third injection site was 1 fingerbreadth superiorly and medially from the first injection site.  -Cervical paraspinal muscle injection, 2 sites, bilaterally. The first injection site was 1 cm from the midline of the cervical spine, 3 cm inferior to the lower border of the occipital protuberance. The second injection site was 1.5 cm superiorly and laterally to the first injection site.  -Trapezius muscle injection was performed at 3 sites, bilaterally. The first injection site was in the upper trapezius muscle halfway between the inflection point of the neck, and the acromion. The second injection site was one half way between the acromion and the first injection site. The third injection was done between the first injection site and the inflection point of the  neck.   Will return for repeat injection in 3 months.   A total of 200 units of Botox  was prepared, 155 units of Botox  was injected as documented above, any Botox  not injected was wasted. The patient tolerated the procedure well, there were no complications of the above procedure.

## 2024-05-29 NOTE — Telephone Encounter (Signed)
 Pharmacy Patient Advocate Encounter  Received notification from [facility] Medicaid that Prior Authorization for Nurtec 75MG  dispersible tablets  has been APPROVED from 05/28/2024 to 05/28/2025   PA #/Case ID/Reference #: [id number]

## 2024-05-30 ENCOUNTER — Telehealth: Payer: Self-pay | Admitting: *Deleted

## 2024-05-30 ENCOUNTER — Telehealth: Payer: Self-pay

## 2024-05-30 ENCOUNTER — Other Ambulatory Visit (HOSPITAL_COMMUNITY): Payer: Self-pay

## 2024-05-30 MED ORDER — HYDROCODONE-ACETAMINOPHEN 5-325 MG PO TABS
1.0000 | ORAL_TABLET | Freq: Four times a day (QID) | ORAL | 0 refills | Status: DC | PRN
  Filled 2024-05-30: qty 40, 5d supply, fill #0

## 2024-05-30 NOTE — Telephone Encounter (Signed)
 Done - Norco Rx (Oxy - post-op mesds) Thx

## 2024-05-30 NOTE — Telephone Encounter (Signed)
 [facility] paperwork filled out and placed in mail records box in pod.   Copy made

## 2024-05-30 NOTE — Telephone Encounter (Signed)
 Spoke w pt and as ortho dr is out, she is requesting medication for her hip

## 2024-05-30 NOTE — Addendum Note (Signed)
 Addended by: Kevyn Wengert V on: 05/30/2024 09:33 AM   Modules accepted: Orders

## 2024-05-30 NOTE — Telephone Encounter (Signed)
Notified pt, pt verbalized understanding.  

## 2024-06-05 ENCOUNTER — Other Ambulatory Visit (HOSPITAL_COMMUNITY): Payer: Self-pay

## 2024-06-05 ENCOUNTER — Encounter: Payer: Self-pay | Admitting: Internal Medicine

## 2024-06-05 ENCOUNTER — Ambulatory Visit (INDEPENDENT_AMBULATORY_CARE_PROVIDER_SITE_OTHER): Admitting: Internal Medicine

## 2024-06-05 VITALS — BP 106/80 | HR 79 | Temp 98.3°F | Ht 66.0 in | Wt 199.0 lb

## 2024-06-05 DIAGNOSIS — J4540 Moderate persistent asthma, uncomplicated: Secondary | ICD-10-CM | POA: Diagnosis not present

## 2024-06-05 DIAGNOSIS — I2609 Other pulmonary embolism with acute cor pulmonale: Secondary | ICD-10-CM | POA: Diagnosis not present

## 2024-06-05 DIAGNOSIS — J3089 Other allergic rhinitis: Secondary | ICD-10-CM | POA: Diagnosis not present

## 2024-06-05 MED ORDER — AZELASTINE HCL 0.1 % NA SOLN
1.0000 | Freq: Two times a day (BID) | NASAL | 12 refills | Status: AC
  Filled 2024-06-05: qty 30, 90d supply, fill #0

## 2024-06-05 MED ORDER — CETIRIZINE HCL 10 MG PO TABS
10.0000 mg | ORAL_TABLET | Freq: Every day | ORAL | 3 refills | Status: AC
  Filled 2024-06-05: qty 30, 30d supply, fill #0
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
## 2024-06-05 NOTE — Patient Instructions (Addendum)
 It was a pleasure to see you today!  Please schedule follow up with myself in 4 months.  If my schedule is not open yet, we will contact you with a reminder closer to that time. Please call [phone] if you haven't heard from us  a month before, and always call us  sooner if issues or concerns arise. You can also send us  a message through MyChart, but but aware that this is not to be used for urgent issues and it may take up to 5-7 days to receive a reply. Please be aware that you will likely be able to view your results before I have a chance to respond to them. Please give us  5 business days to respond to any non-urgent results.    For your asthma I want you to continue the Symbicort  2 puffs twice a day, gargle after use You can use the albuterol  inhaler up to 4 times a day as needed for symptoms of chest tightness wheezing, shortness of breath, cough  For your allergies Lets switch you to cetirizine  1 pill once a day We can also add Astelin  nasal spray see instructions below Astelin  - 1 spray on each side of your nose twice a day for first week, then 1 spray on each side.   Instructions for use: If you also use a saline nasal spray or rinse, use that first. Position the head with the chin slightly tucked. Use the right hand to spray into the left nostril and the right hand to spray into the left nostril.   Point the bottle away from the septum of your nose (cartilage that divides the two sides of your nose).  Hold the nostril closed on the opposite side from where you will spray Spray once and gently sniff to pull the medicine into the higher parts of your nose.  Don't sniff too hard as the medicine will drain down the back of your throat instead. Repeat with a second spray on the same side if prescribed. Repeat on the other side of your nose.  For your history of DVT/PE Continue the Eliquis  2.5 mg twice a day.  Follow-up with Dr. Federico as scheduled.

## 2024-06-10 ENCOUNTER — Ambulatory Visit (INDEPENDENT_AMBULATORY_CARE_PROVIDER_SITE_OTHER): Admitting: Family Medicine

## 2024-06-10 ENCOUNTER — Encounter: Payer: Self-pay | Admitting: Family Medicine

## 2024-06-10 VITALS — BP 117/75

## 2024-06-10 DIAGNOSIS — G43709 Chronic migraine without aura, not intractable, without status migrainosus: Secondary | ICD-10-CM

## 2024-06-10 MED ORDER — ONABOTULINUMTOXINA 200 UNITS IJ SOLR
155.0000 [IU] | Freq: Once | INTRAMUSCULAR | Status: AC
  Administered 2024-06-10 (×2): 155 [IU] via INTRAMUSCULAR

## 2024-06-10 NOTE — Progress Notes (Signed)
 SABRA  Botox - 200 units x 1 vial Lot: [id number] Expiration: 07/2026 NDC: 9976-6078-97  Bacteriostatic 0.9% Sodium Chloride - 30 mL  Lot: [id number] Expiration: OCT-31-2026 NDC: 9590-8033-97  Dx: G43.709 B/B Witnessed by Briant Golden, CMA

## 2024-06-12 NOTE — Telephone Encounter (Signed)
 Pt requesting refill of medication, thanks!

## 2024-06-13 ENCOUNTER — Other Ambulatory Visit (HOSPITAL_COMMUNITY): Payer: Self-pay

## 2024-06-13 MED ORDER — HYDROCODONE-ACETAMINOPHEN 5-325 MG PO TABS
1.0000 | ORAL_TABLET | Freq: Four times a day (QID) | ORAL | 0 refills | Status: DC | PRN
  Filled 2024-06-13: qty 40, 5d supply, fill #0

## 2024-06-13 NOTE — Telephone Encounter (Signed)
 Okay. Thank you.

## 2024-06-13 NOTE — Addendum Note (Signed)
 Addended by: Zali Kamaka V on: 06/13/2024 07:45 AM   Modules accepted: Orders

## 2024-06-14 ENCOUNTER — Telehealth: Payer: Self-pay | Admitting: Orthopaedic Surgery

## 2024-06-14 NOTE — Telephone Encounter (Signed)
 [employer] form faxed

## 2024-06-14 NOTE — Telephone Encounter (Signed)
 Pt came in and filled out a ROI form and paid her $20.00 (via [employer]) form fee for [facility] paperwork.  Admin forms (ROI and Form Request INFO) was placed with copies of the receipt in the tray.

## 2024-06-16 NOTE — Progress Notes (Unsigned)
 Cardiology Office Note    Date:  06/17/2024  ID:  Meghan Reed, DOB 10-23-1986, MRN [id number] PCP:  Jerrie Gathers, DO  Cardiologist:  Aleene Passe, MD (Inactive)  Electrophysiologist:  None   Chief Complaint: Follow up for palpitations   History of Present Illness: Meghan Reed    Meghan Reed is a 38 y.o. 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   Patient was first evaluated by Dr. Passe on 01/03/22 for palpitations.  Patient reported she been having palpitations since November 2022, episode started with dizziness.  Patient reported an episode in which she became extremely dizzy and had nystagmus like sensation when she closed her eyes that lasted a minute or so.  She also reported a sharp shooting sharp pain in her chest as well as a constant chest pressure that had been present for 2 weeks.  Patient underwent an exercise treadmill test on 01/11/2022, no ST deviation was noted, there were no arrhythmias during the test.  ECG was negative for ischemia.  It was felt that her chest pain was related to acid reflux and she was started on omeprazole  20 mg daily.  Cardiac monitor revealed sinus rhythm including bradycardia and tachycardia, very rare PACs and rare PVCs.  She triggered the device several times her symptoms including chest comfort and dizziness each of which corresponded with normal sinus rhythm with no arrhythmias.  She was seen in clinic on 04/05/2022, she reported feeling well with only occasional palpitations.  On chart review patient was admitted from 3/25 through 01/25/2024 for a provoked acute pulmonary embolism after having shoulder surgery for rotator cuff impingement on 01/22/2024.  CTA showed segmental PE to the left upper lobe, negative DVT bilateral lower extremities.  Echocardiogram on 01/23/2024 indicated LVEF of 50 to 55%, no RWMA, diastolic parameters were  normal, RV systolic function was normal, RV size was mildly enlarged, mitral valve was normal in structure, no evidence of mitral regurgitation or stenosis, no aortic stenosis was present. She was started on oral Eliquis  and is now being followed by Dr. Federico with hematology.  Patient was last in clinic on 03/19/2024 regarding increased palpitations, patient reported that following her surgery prior to being diagnosed with her pulmonary embolism she had a short fluttering sensation in her chest followed by pinching sensation that resulted in her presented to the emergency department where she was diagnosed with PE.  She reported that since her diagnosis she regularly had palpitations that could occur at any time, at rest or with exertion and typically lasted a few seconds.  She also endorsed some increase shortness of breath.  Patient wore cardiac monitor for 11 days and 6 hours, predominant rhythm was normal sinus rhythm, there was 1 run of supraventricular tachycardia lasting 5 beats with a max rate of 133 and average of 121, she had rare PACs and PVCs.  Today she presents for follow-up.  She reports that she has been doing well overall. She continues to work at [employer], tolerates overall well, notes some fatigue in the morning, she and Dr. Federico feel this is likely related to low iron levels.  She reports that her palpitations have been significantly improving, she only notes some she becomes anxious or has prolonged exertion, resolves with rest, notes an occasional pinching sensation similar to prior chest discomfort leading to normal exercise stress test.  Patient denies any significant changes and feels that she is doing very  well overall.  She denies any increased shortness of breath, lower extremity edema, orthopnea or PND. She is able to achieve greater than 4 METs of activity.  ROS: .   Today she denies shortness of breath, lower extremity edema, fatigue, melena, hematuria, hemoptysis,  diaphoresis, weakness, presyncope, syncope, orthopnea, and PND.  All other systems are reviewed and otherwise negative. Studies Reviewed: Meghan Reed   EKG:  EKG is ordered today, personally reviewed, demonstrating  EKG Interpretation Date/Time:  Monday June 17 2024 10:05:46 EDT Ventricular Rate:  81 PR Interval:  170 QRS Duration:  76 QT Interval:  362 QTC Calculation: 420 R Axis:   2  Text Interpretation: Normal sinus rhythm Nonspecific ST and T wave abnormality Confirmed by Chloe Bluett [phone]) on 06/17/2024 12:19:06 PM   CV Studies: Cardiac studies reviewed are outlined and summarized above. Otherwise please see EMR for full report. Cardiac Studies & Procedures   ______________________________________________________________________________________________   STRESS TESTS  EXERCISE TOLERANCE TEST (ETT) 01/11/2022  Interpretation Summary   Exercise capacity was normal. Stage 3 was reached after exercising for 8 min and 0 sec. Maximum HR of 162 bpm. MPHR 88.0 %. Peak METS 10.1 . The patient experienced no angina during the test. The patient reported dizziness during the stress test. Onset of symptoms occurred at stage 3 of the protocol. Symptoms began at minute 7:30 during stress and ended at minute 1 during recovery.   No ST deviation was noted. There were no arrhythmias during stress. The ECG was negative for ischemia.   Prior study not available for comparison.   ECHOCARDIOGRAM  ECHOCARDIOGRAM COMPLETE BUBBLE STUDY 01/23/2024  Narrative ECHOCARDIOGRAM REPORT    Patient Name:   Meghan Reed Date of Exam: 01/23/2024 Medical Rec #:  [id number]                      Height:       66.0 in Accession #:    [id number]                     Weight:       194.0 lb Date of Birth:  09-21-1986                      BSA:          1.974 m Patient Age:    37 years                       BP:           120/81 mmHg Patient Gender: F                              HR:           70 bpm. Exam  Location:  Inpatient  Procedure: 2D Echo, Cardiac Doppler and Color Doppler (Both Spectral and Color Flow Doppler were utilized during procedure).  Indications:    Pulmonary embolism, chest pain  History:        Patient has prior history of Echocardiogram examinations, most recent 04/19/2022. Signs/Symptoms:Chest Pain and Murmur; Risk Factors:Dyslipidemia.  Sonographer:    Sabrina Gentry Referring Phys: [id number] EKTA V PATEL  IMPRESSIONS   1. Left ventricular ejection fraction, by estimation, is 50 to 55%. The left ventricle has low normal function. The left ventricle has no regional wall motion abnormalities. Left ventricular diastolic parameters were normal. 2. Right  ventricular systolic function is normal. The right ventricular size is mildly enlarged. 3. The mitral valve is normal in structure. No evidence of mitral valve regurgitation. No evidence of mitral stenosis. 4. The aortic valve was not well visualized. Aortic valve regurgitation is not visualized. No aortic stenosis is present.  Comparison(s): Prior images reviewed side by side. Mild RVOT dilation from prior.  FINDINGS Left Ventricle: Strain and 3D not performed. Left ventricular ejection fraction, by estimation, is 50 to 55%. The left ventricle has low normal function. The left ventricle has no regional wall motion abnormalities. Strain was performed and the global longitudinal strain is indeterminate. The left ventricular internal cavity size was normal in size. There is no left ventricular hypertrophy. Left ventricular diastolic parameters were normal.  Right Ventricle: The right ventricular size is mildly enlarged. No increase in right ventricular wall thickness. Right ventricular systolic function is normal.  Left Atrium: Left atrial size was normal in size.  Right Atrium: Right atrial size was normal in size.  Pericardium: There is no evidence of pericardial effusion.  Mitral Valve: The mitral valve is normal in  structure. No evidence of mitral valve regurgitation. No evidence of mitral valve stenosis. MV peak gradient, 1.7 mmHg. The mean mitral valve gradient is 1.0 mmHg.  Tricuspid Valve: The tricuspid valve is normal in structure. Tricuspid valve regurgitation is not demonstrated. No evidence of tricuspid stenosis.  Aortic Valve: The aortic valve was not well visualized. Aortic valve regurgitation is not visualized. No aortic stenosis is present. Aortic valve mean gradient measures 2.0 mmHg. Aortic valve peak gradient measures 3.2 mmHg. Aortic valve area, by VTI measures 2.03 cm.  Pulmonic Valve: The pulmonic valve was normal in structure. Pulmonic valve regurgitation is not visualized. No evidence of pulmonic stenosis.  Aorta: The aortic root, ascending aorta and aortic arch are all structurally normal, with no evidence of dilitation or obstruction.  IAS/Shunts: The atrial septum is grossly normal.  Additional Comments: 3D was performed not requiring image post processing on an independent workstation and was indeterminate.   LEFT VENTRICLE PLAX 2D LVIDd:         4.50 cm      Diastology LVIDs:         2.50 cm      LV e' medial:    11.40 cm/s LV PW:         0.70 cm      LV E/e' medial:  4.9 LV IVS:        0.60 cm      LV e' lateral:   15.00 cm/s LVOT diam:     2.00 cm      LV E/e' lateral: 3.7 LV SV:         38 LV SV Index:   19 LVOT Area:     3.14 cm  LV Volumes (MOD) LV vol d, MOD A2C: 101.0 ml LV vol d, MOD A4C: 129.0 ml LV vol s, MOD A2C: 51.0 ml LV vol s, MOD A4C: 63.8 ml LV SV MOD A2C:     50.0 ml LV SV MOD A4C:     129.0 ml LV SV MOD BP:      60.9 ml  RIGHT VENTRICLE            IVC RV Basal diam:  3.20 cm    IVC diam: 1.10 cm RV Mid diam:    2.70 cm RV S prime:     8.60 cm/s  LEFT ATRIUM  Index        RIGHT ATRIUM          Index LA diam:      2.90 cm 1.47 cm/m   RA Area:     9.84 cm LA Vol (A2C): 33.7 ml 17.07 ml/m  RA Volume:   18.40 ml 9.32 ml/m LA Vol  (A4C): 24.6 ml 12.46 ml/m AORTIC VALVE                    PULMONIC VALVE AV Area (Vmax):    2.25 cm     PV Vmax:       0.71 m/s AV Area (Vmean):   2.08 cm     PV Peak grad:  2.0 mmHg AV Area (VTI):     2.03 cm AV Vmax:           89.10 cm/s AV Vmean:          68.500 cm/s AV VTI:            0.187 m AV Peak Grad:      3.2 mmHg AV Mean Grad:      2.0 mmHg LVOT Vmax:         63.90 cm/s LVOT Vmean:        45.300 cm/s LVOT VTI:          0.121 m LVOT/AV VTI ratio: 0.65  AORTA Ao Root diam: 3.60 cm Ao Asc diam:  3.70 cm  MITRAL VALVE MV Area (PHT): 2.52 cm    SHUNTS MV Area VTI:   2.17 cm    Systemic VTI:  0.12 m MV Peak grad:  1.7 mmHg    Systemic Diam: 2.00 cm MV Mean grad:  1.0 mmHg MV Vmax:       0.65 m/s MV Vmean:      40.9 cm/s MV Decel Time: 301 msec MV E velocity: 55.50 cm/s MV A velocity: 38.30 cm/s MV E/A ratio:  1.45  Stanly Leavens MD Electronically signed by Stanly Leavens MD Signature Date/Time: 01/23/2024/4:04:11 PM    Final    MONITORS  LONG TERM MONITOR (3-14 DAYS) 04/09/2024  Narrative   Predominat rhythm is normal sinus rhythm   There was 1 run of Supraventricular tachycardia.   The episode lasted 5 beats with a max HR of 133 and avg HR of 121   Rare PACs, rare PACs   Patch Wear Time:  11 days and 6 hours (2025-05-23T00:21:19-0400 to 2025-06-03T06:59:54-0400)  Patient had a min HR of 58 bpm, max HR of 164 bpm, and avg HR of 93 bpm. Predominant underlying rhythm was Sinus Rhythm. 1 run of Supraventricular Tachycardia occurred lasting 5 beats with a max rate of 133 bpm (avg 121 bpm). Isolated SVEs were rare (<1.0%), SVE Couplets were rare (<1.0%), and no SVE Triplets were present. Isolated VEs were rare (<1.0%), and no VE Couplets or VE Triplets were present.       ______________________________________________________________________________________________       Current Reported Medications:.    Current Meds  Medication Sig    albuterol  (PROVENTIL ) (2.5 MG/3ML) 0.083% nebulizer solution Take 3 mLs (2.5 mg total) by nebulization every 6 (six) hours as needed for wheezing or shortness of breath.   albuterol  (VENTOLIN  HFA) 108 (90 Base) MCG/ACT inhaler Inhale 2 puffs into the lungs every 4 (four) hours as needed for wheezing or shortness of breath.   apixaban  (ELIQUIS ) 2.5 MG TABS tablet Take 1 tablet (2.5 mg total) by mouth 2 (two) times daily.  azelastine  (ASTELIN ) 0.1 % nasal spray Place 1 spray into both nostrils 2 (two) times daily. Use in each nostril as directed   budesonide -formoterol  (SYMBICORT ) 160-4.5 MCG/ACT inhaler Inhale 2 puffs into the lungs 2 (two) times daily.   cetirizine  (ZYRTEC ) 10 MG tablet Take 1 tablet (10 mg total) by mouth daily.   cyclobenzaprine  (FLEXERIL ) 10 MG tablet Take 1 tablet (10 mg total) by mouth every 8 (eight) hours as needed for muscle spasms.   Galcanezumab -gnlm (EMGALITY ) 120 MG/ML SOAJ Inject 120 mg into the skin every 30 days.   oxyCODONE  (ROXICODONE ) 5 MG immediate release tablet Take 1 tablet (5 mg total) by mouth every 4 (four) hours as needed for severe pain (pain score 7-10) or breakthrough pain.   Rimegepant Sulfate  (NURTEC) 75 MG TBDP Dissolve 1 tablet (75 mg total) by mouth daily as needed (take for abortive therapy of migraine, no more than 1 tablet in 24 hours or 10 per month).   [DISCONTINUED] ferrous sulfate  325 (65 FE) MG tablet Take 1 tablet (325 mg total) by mouth daily with breakfast. Please take with a source of Vitamin C   [DISCONTINUED] omeprazole  (PRILOSEC  OTC) 20 MG tablet Take 1 tablet (20 mg total) by mouth daily.   Physical Exam:    VS:  BP 106/78   Pulse 86   Ht 5' 6 (1.676 m)   Wt 196 lb 12.8 oz (89.3 kg)   LMP  (LMP Unknown)   SpO2 92%   BMI 31.76 kg/m    Wt Readings from Last 3 Encounters:  06/17/24 196 lb 12.8 oz (89.3 kg)  06/05/24 199 lb (90.3 kg)  05/23/24 197 lb 3.2 oz (89.4 kg)    GEN: Well nourished, well developed in no acute  distress NECK: No JVD; No carotid bruits CARDIAC: RRR, no murmurs, rubs, gallops RESPIRATORY:  Clear to auscultation without rales, wheezing or rhonchi  ABDOMEN: Soft, non-tender, non-distended EXTREMITIES:  No edema; No acute deformity     Asessement and Plan:.    Palpitations: Patient with history of palpitations in 2023, cardiac monitor at time was reassuring.  Patient diagnosed with pulmonary embolism in 12/2023, on Eliquis .  Echo on 01/23/2024 indicated LVEF 50 to 55%, no RWMA, diastolic murmurs were normal, RV systolic function was normal, RV size is mildly enlarged, mitral valve was normal in structure, no evidence of mitral regurgitation or stenosis, no aortic stenosis present.  At last visit in 02/2024 she endorsed short fluttering sensation to her chest associated with shortness of breath.  2-week cardiac monitor reassuring as noted above.  Today she reports that she has had improvement in palpitations in recent weeks, note these only occur with prolonged exertion or increased anxiety, again notes improvement and is reassured with cardiac monitor. Discussed beta blocker if she has ongoing palpitations, through shared decision making elected not to start at this time. She will continue to monitor and notify the office of any worsening symptoms. Encouraged to continue with increased hydration and decreased caffeine intake.   Pulmonary embolism: Patient with history of pulmonary embolism in 2020 in setting of pregnancy.  She had recurrence following shoulder surgery for rotator cuff impingement in 12/2023.  She denies any bleeding problems on Eliquis .  She is now followed by hematology and is on lifelong anticoagulation.  Preoperative cardiac evaluation: She is pending hip arthroscopy in September.  She has remained stable from a cardiac standpoint and is able to achieve greater than 4 METS of activity.  Patient can proceed with procedure at  appropriate cardiovascular risk without any further  cardiovascular testing.  Recommendations regarding holding of Eliquis  will need to come from hematology.   Disposition: F/u as needed.   Signed, Meleane Selinger D Zamarah Ullmer, NP

## 2024-06-17 ENCOUNTER — Ambulatory Visit: Attending: Cardiology | Admitting: Cardiology

## 2024-06-17 ENCOUNTER — Encounter: Payer: Self-pay | Admitting: Hematology and Oncology

## 2024-06-17 ENCOUNTER — Encounter: Payer: Self-pay | Admitting: Cardiology

## 2024-06-17 ENCOUNTER — Other Ambulatory Visit: Payer: Self-pay | Admitting: Hematology and Oncology

## 2024-06-17 ENCOUNTER — Other Ambulatory Visit: Payer: Self-pay

## 2024-06-17 ENCOUNTER — Other Ambulatory Visit (HOSPITAL_COMMUNITY): Payer: Self-pay

## 2024-06-17 VITALS — BP 106/78 | HR 86 | Ht 66.0 in | Wt 196.8 lb

## 2024-06-17 DIAGNOSIS — R002 Palpitations: Secondary | ICD-10-CM | POA: Insufficient documentation

## 2024-06-17 DIAGNOSIS — I2699 Other pulmonary embolism without acute cor pulmonale: Secondary | ICD-10-CM | POA: Diagnosis not present

## 2024-06-17 DIAGNOSIS — Z01810 Encounter for preprocedural cardiovascular examination: Secondary | ICD-10-CM | POA: Diagnosis present

## 2024-06-17 MED ORDER — OMEPRAZOLE 20 MG PO CPDR
20.0000 mg | DELAYED_RELEASE_CAPSULE | Freq: Every day | ORAL | 2 refills | Status: AC
Start: 2024-06-17 — End: ?
  Filled 2024-06-17: qty 90, 90d supply, fill #0

## 2024-06-17 MED ORDER — FERROUS SULFATE 325 (65 FE) MG PO TABS
325.0000 mg | ORAL_TABLET | Freq: Every day | ORAL | 3 refills | Status: AC
  Filled 2024-06-17: qty 90, 90d supply, fill #0

## 2024-06-17 NOTE — Patient Instructions (Signed)
 Medication Instructions:  No changes *If you need a refill on your cardiac medications before your next appointment, please call your pharmacy*  Lab Work: No labs  Testing/Procedures: No testing  Follow-Up: At [facility], you and your health needs are our priority.  As part of our continuing mission to provide you with exceptional heart care, our providers are all part of one team.  This team includes your primary Cardiologist (physician) and Advanced Practice Providers or APPs (Physician Assistants and Nurse Practitioners) who all work together to provide you with the care you need, when you need it.  Your next appointment:   As needed  We recommend signing up for the patient portal called MyChart.  Sign up information is provided on this After Visit Summary.  MyChart is used to connect with patients for Virtual Visits (Telemedicine).  Patients are able to view lab/test results, encounter notes, upcoming appointments, etc.  Non-urgent messages can be sent to your provider as well.   To learn more about what you can do with MyChart, go to [email or web address].

## 2024-06-18 ENCOUNTER — Other Ambulatory Visit: Payer: Self-pay

## 2024-06-18 ENCOUNTER — Telehealth: Payer: Self-pay | Admitting: Hematology and Oncology

## 2024-06-18 DIAGNOSIS — D500 Iron deficiency anemia secondary to blood loss (chronic): Secondary | ICD-10-CM

## 2024-06-19 ENCOUNTER — Inpatient Hospital Stay: Attending: Hematology and Oncology

## 2024-06-19 DIAGNOSIS — D500 Iron deficiency anemia secondary to blood loss (chronic): Secondary | ICD-10-CM | POA: Insufficient documentation

## 2024-06-19 LAB — CBC WITH DIFFERENTIAL (CANCER CENTER ONLY)
Abs Immature Granulocytes: 0.01 K/uL (ref 0.00–0.07)
Basophils Absolute: 0 K/uL (ref 0.0–0.1)
Basophils Relative: 1 %
Eosinophils Absolute: 0.1 K/uL (ref 0.0–0.5)
Eosinophils Relative: 2 %
HCT: 36.8 % (ref 36.0–46.0)
Hemoglobin: 12.8 g/dL (ref 12.0–15.0)
Immature Granulocytes: 0 %
Lymphocytes Relative: 36 %
Lymphs Abs: 2 K/uL (ref 0.7–4.0)
MCH: 31 pg (ref 26.0–34.0)
MCHC: 34.8 g/dL (ref 30.0–36.0)
MCV: 89.1 fL (ref 80.0–100.0)
Monocytes Absolute: 0.4 K/uL (ref 0.1–1.0)
Monocytes Relative: 7 %
Neutro Abs: 3 K/uL (ref 1.7–7.7)
Neutrophils Relative %: 54 %
Platelet Count: 227 K/uL (ref 150–400)
RBC: 4.13 MIL/uL (ref 3.87–5.11)
RDW: 12.3 % (ref 11.5–15.5)
WBC Count: 5.5 K/uL (ref 4.0–10.5)
nRBC: 0 % (ref 0.0–0.2)

## 2024-06-19 LAB — CMP (CANCER CENTER ONLY)
ALT: 14 U/L (ref 0–44)
AST: 15 U/L (ref 15–41)
Albumin: 4.4 g/dL (ref 3.5–5.0)
Alkaline Phosphatase: 35 U/L — ABNORMAL LOW (ref 38–126)
Anion gap: 5 (ref 5–15)
BUN: 9 mg/dL (ref 6–20)
CO2: 27 mmol/L (ref 22–32)
Calcium: 9.3 mg/dL (ref 8.9–10.3)
Chloride: 106 mmol/L (ref 98–111)
Creatinine: 0.71 mg/dL (ref 0.44–1.00)
GFR, Estimated: >60 mL/min (ref >60)
Glucose, Bld: 89 mg/dL (ref 70–99)
Potassium: 4.2 mmol/L (ref 3.5–5.1)
Sodium: 138 mmol/L (ref 135–145)
Total Bilirubin: 0.5 mg/dL (ref 0.0–1.2)
Total Protein: 7 g/dL (ref 6.5–8.1)

## 2024-06-19 LAB — IRON AND IRON BINDING CAPACITY (CC-WL,HP ONLY)
Iron: 106 ug/dL (ref 28–170)
Saturation Ratios: 33 % — ABNORMAL HIGH (ref 10.4–31.8)
TIBC: 322 ug/dL (ref 250–450)
UIBC: 216 ug/dL (ref 148–442)

## 2024-06-19 LAB — FERRITIN: Ferritin: 134 ng/mL (ref 11–307)

## 2024-06-24 ENCOUNTER — Encounter (HOSPITAL_BASED_OUTPATIENT_CLINIC_OR_DEPARTMENT_OTHER): Payer: Self-pay | Admitting: Orthopaedic Surgery

## 2024-06-24 ENCOUNTER — Other Ambulatory Visit: Payer: Self-pay

## 2024-06-24 NOTE — Progress Notes (Addendum)
   06/24/24 1423  PAT Phone Screen  Is the patient taking a GLP-1 receptor agonist? No  Do You Have Diabetes? No  Do You Have Hypertension? No  Have You Ever Been to the ER for Asthma? No  Have You Taken Oral Steroids in the Past 3 Months? No  Do you Take Phenteramine or any Other Diet Drugs? No  Recent  Lab Work, EKG, CXR? Yes  Where was this test performed? 06/17/24 EKG NSR  Cardiologist Name previous pt of Dr Alveta for palpitations,  Now seen by MARLA Mose NP for surgical clearance 06/17/24  Have you ever had tests on your heart? Yes  What cardiac tests were performed? Echo;Other (comment)  What date/year were cardiac tests completed? 01/23/24 Echo EF 50-55% Long term monitor: 04/09/24 NSR w/ 1 run of SVT x 5b  Results viewable: [facility] Media Tab  Any Recent Hospitalizations? No  Height 5' 6 (1.676 m)  Weight 89.3 kg  Pat Appointment Scheduled No  Reason for No Appointment Not Needed   Pt w/ hx of PE x 2 on Eliquis . Per pt she is to hold Eliquis  on morning of surgery only according to both Dr Federico with Heme/Onc and Dr Genelle.  April at Dr Danetta office notified and will request written instructions from Dr Federico.  Chart and cardiac clearance reviewed with Dr Paul.

## 2024-06-26 NOTE — Progress Notes (Signed)
 Patient provided with CHG soap along with education on application. Patient understanding of information       Enhanced Recovery after Surgery for Orthopedics Enhanced Recovery after Surgery is a protocol used to improve the stress on your body and your recovery after surgery.  Patient Instructions  The night before surgery:  No food after midnight. ONLY clear liquids after midnight  The day of surgery (if you do NOT have diabetes):  Drink ONE (1) Pre-Surgery Clear Ensure as directed.   This drink was given to you during your hospital  pre-op appointment visit. The pre-op nurse will instruct you on the time to drink the  Pre-Surgery Ensure depending on your surgery time. Finish the drink at the designated time by the pre-op nurse.  Nothing else to drink after completing the  Pre-Surgery Clear Ensure.  The day of surgery (if you have diabetes): Drink ONE (1) Gatorade 2 (G2) as directed. This drink was given to you during your hospital  pre-op appointment visit.  The pre-op nurse will instruct you on the time to drink the   Gatorade 2 (G2) depending on your surgery time. Color of the Gatorade may vary. Red is not allowed. Nothing else to drink after completing the  Gatorade 2 (G2).         If you have questions, please contact your surgeon's office.

## 2024-06-27 ENCOUNTER — Other Ambulatory Visit (HOSPITAL_COMMUNITY): Payer: Self-pay

## 2024-07-01 ENCOUNTER — Encounter (HOSPITAL_BASED_OUTPATIENT_CLINIC_OR_DEPARTMENT_OTHER): Payer: Self-pay | Admitting: Orthopaedic Surgery

## 2024-07-01 NOTE — Anesthesia Preprocedure Evaluation (Signed)
 Anesthesia Evaluation  Patient identified by MRN, date of birth, ID band Patient awake    Reviewed: Allergy  & Precautions, NPO status , Patient's Chart, lab work & pertinent test results  History of Anesthesia Complications (+) PONV, POST - OP SPINAL HEADACHE and history of anesthetic complications  Airway Mallampati: II  TM Distance: >3 FB     Dental no notable dental hx. (+) Teeth Intact, Dental Advisory Given   Pulmonary asthma , former smoker, PE Hx/o PTE 2020 during pregnancy   Pulmonary exam normal breath sounds clear to auscultation       Cardiovascular negative cardio ROS Normal cardiovascular exam+ Valvular Problems/Murmurs  Rhythm:Regular Rate:Normal     Neuro/Psych  Headaches PSYCHIATRIC DISORDERS Anxiety      Neuromuscular disease    GI/Hepatic Neg liver ROS,GERD  Medicated,,  Endo/Other  Obesity   Renal/GU negative Renal ROS  negative genitourinary   Musculoskeletal  (+) Arthritis , Osteoarthritis,  Torn acetabular labrum right hip   Abdominal  (+) + obese  Peds  Hematology  (+) Blood dyscrasia, anemia Eliquis  therapy- last dose 8/30   Anesthesia Other Findings   Reproductive/Obstetrics                              Anesthesia Physical Anesthesia Plan  ASA: 2  Anesthesia Plan: General   Post-op Pain Management: Ofirmev  IV (intra-op)* and Precedex    Induction: Intravenous  PONV Risk Score and Plan: 4 or greater and Treatment may vary due to age or medical condition, Midazolam , Ondansetron  and Dexamethasone   Airway Management Planned: LMA and Oral ETT  Additional Equipment: None  Intra-op Plan:   Post-operative Plan: Extubation in OR  Informed Consent: I have reviewed the patients History and Physical, chart, labs and discussed the procedure including the risks, benefits and alternatives for the proposed anesthesia with the patient or authorized representative who  has indicated his/her understanding and acceptance.     Dental advisory given  Plan Discussed with: CRNA and Anesthesiologist  Anesthesia Plan Comments:          Anesthesia Quick Evaluation

## 2024-07-02 ENCOUNTER — Ambulatory Visit (HOSPITAL_BASED_OUTPATIENT_CLINIC_OR_DEPARTMENT_OTHER)
Admission: RE | Admit: 2024-07-02 | Discharge: 2024-07-02 | Disposition: A | Discharge status: Home / Self Care | Attending: Orthopaedic Surgery | Admitting: Orthopaedic Surgery

## 2024-07-02 ENCOUNTER — Ambulatory Visit (HOSPITAL_BASED_OUTPATIENT_CLINIC_OR_DEPARTMENT_OTHER): Payer: Self-pay | Admitting: Anesthesiology

## 2024-07-02 ENCOUNTER — Ambulatory Visit (HOSPITAL_COMMUNITY)

## 2024-07-02 ENCOUNTER — Other Ambulatory Visit: Payer: Self-pay

## 2024-07-02 ENCOUNTER — Encounter (HOSPITAL_BASED_OUTPATIENT_CLINIC_OR_DEPARTMENT_OTHER)
Admission: RE | Disposition: A | Discharge status: Home / Self Care | Payer: Self-pay | Source: Home / Self Care | Attending: Orthopaedic Surgery

## 2024-07-02 ENCOUNTER — Encounter (HOSPITAL_BASED_OUTPATIENT_CLINIC_OR_DEPARTMENT_OTHER): Payer: Self-pay | Admitting: Orthopaedic Surgery

## 2024-07-02 DIAGNOSIS — Z6830 Body mass index (BMI) 30.0-30.9, adult: Secondary | ICD-10-CM | POA: Diagnosis not present

## 2024-07-02 DIAGNOSIS — M25351 Other instability, right hip: Secondary | ICD-10-CM | POA: Diagnosis not present

## 2024-07-02 DIAGNOSIS — M1990 Unspecified osteoarthritis, unspecified site: Secondary | ICD-10-CM | POA: Insufficient documentation

## 2024-07-02 DIAGNOSIS — Z87891 Personal history of nicotine dependence: Secondary | ICD-10-CM | POA: Insufficient documentation

## 2024-07-02 DIAGNOSIS — Z7901 Long term (current) use of anticoagulants: Secondary | ICD-10-CM | POA: Insufficient documentation

## 2024-07-02 DIAGNOSIS — X58XXXA Exposure to other specified factors, initial encounter: Secondary | ICD-10-CM | POA: Diagnosis not present

## 2024-07-02 DIAGNOSIS — S73191A Other sprain of right hip, initial encounter: Secondary | ICD-10-CM | POA: Diagnosis not present

## 2024-07-02 DIAGNOSIS — J45909 Unspecified asthma, uncomplicated: Secondary | ICD-10-CM | POA: Diagnosis not present

## 2024-07-02 DIAGNOSIS — M25551 Pain in right hip: Secondary | ICD-10-CM

## 2024-07-02 DIAGNOSIS — E669 Obesity, unspecified: Secondary | ICD-10-CM | POA: Diagnosis not present

## 2024-07-02 DIAGNOSIS — Z7951 Long term (current) use of inhaled steroids: Secondary | ICD-10-CM | POA: Diagnosis not present

## 2024-07-02 DIAGNOSIS — K219 Gastro-esophageal reflux disease without esophagitis: Secondary | ICD-10-CM | POA: Diagnosis not present

## 2024-07-02 DIAGNOSIS — Z86711 Personal history of pulmonary embolism: Secondary | ICD-10-CM | POA: Diagnosis not present

## 2024-07-02 SURGERY — ARTHROSCOPY, HIP, WITH LABRUM REPAIR
Anesthesia: General | Site: Hip | Laterality: Right

## 2024-07-02 MED ORDER — PHENYLEPHRINE HCL (PRESSORS) 10 MG/ML IV SOLN
INTRAVENOUS | Status: DC | PRN
  Administered 2024-07-02 (×3): 160 ug via INTRAVENOUS
  Administered 2024-07-02: 80 ug via INTRAVENOUS

## 2024-07-02 MED ORDER — PROPOFOL 10 MG/ML IV BOLUS
INTRAVENOUS | Status: AC
  Filled 2024-07-02: qty 20

## 2024-07-02 MED ORDER — HYDROMORPHONE HCL 1 MG/ML IJ SOLN
INTRAMUSCULAR | Status: AC
  Filled 2024-07-02: qty 0.5

## 2024-07-02 MED ORDER — SUGAMMADEX SODIUM 200 MG/2ML IV SOLN
INTRAVENOUS | Status: DC | PRN
  Administered 2024-07-02: 350 mg via INTRAVENOUS

## 2024-07-02 MED ORDER — CEFAZOLIN SODIUM-DEXTROSE 2-4 GM/100ML-% IV SOLN
INTRAVENOUS | Status: AC
  Filled 2024-07-02: qty 100

## 2024-07-02 MED ORDER — DROPERIDOL 2.5 MG/ML IJ SOLN: 0.6250 mg | Freq: Once | INTRAMUSCULAR | Status: DC | PRN

## 2024-07-02 MED ORDER — GABAPENTIN 300 MG PO CAPS
ORAL_CAPSULE | ORAL | Status: AC
Start: 2024-07-02 — End: 2024-07-02
  Filled 2024-07-02: qty 1

## 2024-07-02 MED ORDER — LIDOCAINE HCL (CARDIAC) PF 100 MG/5ML IV SOSY
PREFILLED_SYRINGE | INTRAVENOUS | Status: DC | PRN
  Administered 2024-07-02: 80 mg via INTRAVENOUS

## 2024-07-02 MED ORDER — HYDROMORPHONE HCL 1 MG/ML IJ SOLN
INTRAMUSCULAR | Status: AC
Start: 2024-07-02 — End: 2024-07-02
  Filled 2024-07-02: qty 0.5

## 2024-07-02 MED ORDER — ONDANSETRON HCL 4 MG/2ML IJ SOLN: 4.0000 mg | Freq: Once | INTRAMUSCULAR | Status: DC | PRN

## 2024-07-02 MED ORDER — MIDAZOLAM HCL 2 MG/2ML IJ SOLN
INTRAMUSCULAR | Status: AC
  Filled 2024-07-02: qty 2

## 2024-07-02 MED ORDER — ACETAMINOPHEN 500 MG PO TABS
ORAL_TABLET | ORAL | Status: AC
  Filled 2024-07-02: qty 2

## 2024-07-02 MED ORDER — ACETAMINOPHEN 500 MG PO TABS
1000.0000 mg | ORAL_TABLET | Freq: Once | ORAL | Status: AC
  Administered 2024-07-02: 1000 mg via ORAL

## 2024-07-02 MED ORDER — MIDAZOLAM HCL 5 MG/5ML IJ SOLN
INTRAMUSCULAR | Status: DC | PRN
  Administered 2024-07-02: 2 mg via INTRAVENOUS

## 2024-07-02 MED ORDER — ONDANSETRON HCL 4 MG/2ML IJ SOLN
INTRAMUSCULAR | Status: AC
  Filled 2024-07-02: qty 2

## 2024-07-02 MED ORDER — HYDROMORPHONE HCL 1 MG/ML IJ SOLN
0.2500 mg | INTRAMUSCULAR | Status: DC | PRN
  Administered 2024-07-02 (×4): 0.5 mg via INTRAVENOUS

## 2024-07-02 MED ORDER — ONDANSETRON HCL 4 MG/2ML IJ SOLN
INTRAMUSCULAR | Status: DC | PRN
  Administered 2024-07-02: 4 mg via INTRAVENOUS

## 2024-07-02 MED ORDER — OXYCODONE HCL 5 MG/5ML PO SOLN: 5.0000 mg | Freq: Once | ORAL | Status: AC | PRN

## 2024-07-02 MED ORDER — ROCURONIUM BROMIDE 100 MG/10ML IV SOLN
INTRAVENOUS | Status: DC | PRN
Start: 2024-07-02 — End: 2024-07-02
  Administered 2024-07-02: 70 mg via INTRAVENOUS

## 2024-07-02 MED ORDER — CEFAZOLIN SODIUM-DEXTROSE 2-4 GM/100ML-% IV SOLN
2.0000 g | INTRAVENOUS | Status: AC
  Administered 2024-07-02: 2 g via INTRAVENOUS

## 2024-07-02 MED ORDER — PROPOFOL 10 MG/ML IV BOLUS
INTRAVENOUS | Status: DC | PRN
  Administered 2024-07-02: 180 mg via INTRAVENOUS

## 2024-07-02 MED ORDER — FENTANYL CITRATE (PF) 100 MCG/2ML IJ SOLN
INTRAMUSCULAR | Status: AC
  Filled 2024-07-02: qty 2

## 2024-07-02 MED ORDER — ROCURONIUM BROMIDE 10 MG/ML (PF) SYRINGE
PREFILLED_SYRINGE | INTRAVENOUS | Status: AC
  Filled 2024-07-02: qty 10

## 2024-07-02 MED ORDER — FENTANYL CITRATE (PF) 100 MCG/2ML IJ SOLN
INTRAMUSCULAR | Status: DC | PRN
  Administered 2024-07-02: 100 ug via INTRAVENOUS

## 2024-07-02 MED ORDER — GABAPENTIN 300 MG PO CAPS
300.0000 mg | ORAL_CAPSULE | Freq: Once | ORAL | Status: AC
  Administered 2024-07-02: 300 mg via ORAL

## 2024-07-02 MED ORDER — SODIUM CHLORIDE 0.9 % IR SOLN
Status: DC | PRN
  Administered 2024-07-02: 6000 mL

## 2024-07-02 MED ORDER — LACTATED RINGERS IV SOLN: INTRAVENOUS | Status: DC

## 2024-07-02 MED ORDER — TRANEXAMIC ACID-NACL 1000-0.7 MG/100ML-% IV SOLN: 1000.0000 mg | INTRAVENOUS | Status: DC

## 2024-07-02 MED ORDER — OXYCODONE HCL 5 MG PO TABS
5.0000 mg | ORAL_TABLET | Freq: Once | ORAL | Status: AC | PRN
  Administered 2024-07-02: 5 mg via ORAL

## 2024-07-02 MED ORDER — DEXAMETHASONE SODIUM PHOSPHATE 4 MG/ML IJ SOLN
INTRAMUSCULAR | Status: DC | PRN
  Administered 2024-07-02: 5 mg via INTRAVENOUS

## 2024-07-02 MED ORDER — DEXAMETHASONE SODIUM PHOSPHATE 10 MG/ML IJ SOLN
INTRAMUSCULAR | Status: AC
  Filled 2024-07-02: qty 1

## 2024-07-02 MED ORDER — LIDOCAINE 2% (20 MG/ML) 5 ML SYRINGE
INTRAMUSCULAR | Status: AC
  Filled 2024-07-02: qty 5

## 2024-07-02 MED ORDER — OXYCODONE HCL 5 MG PO TABS
ORAL_TABLET | ORAL | Status: AC
Start: 2024-07-02 — End: 2024-07-02
  Filled 2024-07-02: qty 1

## 2024-07-02 MED ORDER — BUPIVACAINE HCL 0.25 % IJ SOLN
INTRAMUSCULAR | Status: DC | PRN
  Administered 2024-07-02: 20 mL

## 2024-07-02 SURGICAL SUPPLY — 55 items
ANCHOR SUT 1.4 FLEX (Anchor) IMPLANT
BLADE SAMURAI STR FULL RADIUS (BLADE) IMPLANT
BLADE SURG 11 STRL SS (BLADE) ×2 IMPLANT
BUR ROUND HI FLUTE 8 4X19 (BURR) IMPLANT
CANISTER SUCT 1200ML W/VALVE (MISCELLANEOUS) ×2 IMPLANT
CANNULA OBTURATOR FLOWPORT ST5 (CANNULA) IMPLANT
CHLORAPREP W/TINT 26 (MISCELLANEOUS) ×2 IMPLANT
COOLER ICEMAN CLASSIC (MISCELLANEOUS) ×2 IMPLANT
COVER BACK TABLE 60X90IN (DRAPES) ×2 IMPLANT
COVER MAYO STAND STRL (DRAPES) ×4 IMPLANT
DERMABOND ADVANCED .7 DNX12 (GAUZE/BANDAGES/DRESSINGS) IMPLANT
DISSECTOR 4.2MMX19CM HL (MISCELLANEOUS) ×2 IMPLANT
DRAPE C-ARM 42X72 X-RAY (DRAPES) ×2 IMPLANT
DRAPE STERI IOBAN 125X83 (DRAPES) IMPLANT
DRAPE U-SHAPE 47X51 STRL (DRAPES) ×4 IMPLANT
DRSG TEGADERM 4X4.75 (GAUZE/BANDAGES/DRESSINGS) ×6 IMPLANT
FEE RENTAL EQUIP HIP INSTR KIT (INSTRUMENTS) IMPLANT
GAUZE PAD ABD 8X10 STRL (GAUZE/BANDAGES/DRESSINGS) IMPLANT
GAUZE SPONGE 4X4 12PLY STRL (GAUZE/BANDAGES/DRESSINGS) ×2 IMPLANT
GAUZE XEROFORM 1X8 LF (GAUZE/BANDAGES/DRESSINGS) ×2 IMPLANT
GLOVE BIO SURGEON STRL SZ 6 (GLOVE) ×4 IMPLANT
GLOVE BIO SURGEON STRL SZ7.5 (GLOVE) ×4 IMPLANT
GLOVE BIOGEL PI IND STRL 6.5 (GLOVE) ×2 IMPLANT
GLOVE BIOGEL PI IND STRL 8 (GLOVE) ×2 IMPLANT
GOWN STRL REUS W/ TWL LRG LVL3 (GOWN DISPOSABLE) ×4 IMPLANT
GOWN STRL REUS W/TWL XL LVL3 (GOWN DISPOSABLE) ×2 IMPLANT
INSTRUMENT ORTHO TEXT HIP FEM (INSTRUMENTS) IMPLANT
KIT PATIENT POSITION MEDIUM (KITS) IMPLANT
KIT PORTAL ENTRY HIP ACCESS (KITS) IMPLANT
MANIFOLD NEPTUNE II (INSTRUMENTS) ×2 IMPLANT
NDL HYPO 22X1.5 SAFETY MO (MISCELLANEOUS) IMPLANT
NDL INJECTOR II CARTRIDGE (MISCELLANEOUS) IMPLANT
NDL SPNL 18GX3.5 QUINCKE PK (NEEDLE) ×2 IMPLANT
NDL SUT 6 .5 CRC .975X.05 MAYO (NEEDLE) IMPLANT
NEEDLE HYPO 22X1.5 SAFETY MO (MISCELLANEOUS) IMPLANT
NEEDLE INJECTOR II CARTRIDGE (MISCELLANEOUS) ×1 IMPLANT
NEEDLE SPNL 18GX3.5 QUINCKE PK (NEEDLE) ×1 IMPLANT
PACK BASIN DAY SURGERY FS (CUSTOM PROCEDURE TRAY) ×2 IMPLANT
PAD COLD SHLDR WRAP-ON (PAD) ×2 IMPLANT
PASSER SUT 1.5D CRESCENT (INSTRUMENTS) IMPLANT
SPIKE FLUID TRANSFER (MISCELLANEOUS) IMPLANT
SPONGE T-LAP 18X18 ~~LOC~~+RFID (SPONGE) IMPLANT
SUCTION TUBE FRAZIER 10FR DISP (SUCTIONS) IMPLANT
SUT ETHILON 3 0 PS 1 (SUTURE) ×2 IMPLANT
SUT VIC AB 0 CT1 27XBRD ANBCTR (SUTURE) IMPLANT
SUT VIC AB 2-0 CT1 TAPERPNT 27 (SUTURE) IMPLANT
SUT XBRAID 1.4 BLUE (SUTURE) IMPLANT
SUTURE FIBERWR #2 38 T-5 BLUE (SUTURE) IMPLANT
SUTURE TAPE 1.3 FIBERLOP 20 ST (SUTURE) IMPLANT
SYR 20ML LL LF (SYRINGE) IMPLANT
SYR 50ML LL SCALE MARK (SYRINGE) ×2 IMPLANT
TOWEL GREEN STERILE FF (TOWEL DISPOSABLE) ×4 IMPLANT
TUBE CONNECTING 20X1/4 (TUBING) ×6 IMPLANT
TUBING ARTHROSCOPY IRRIG 16FT (MISCELLANEOUS) ×2 IMPLANT
WAND APOLLO RF 50D ABLATOR (BUR) ×2 IMPLANT

## 2024-07-02 NOTE — Op Note (Signed)
 Date of Surgery: 07/02/2024  INDICATIONS: [name] is a 38 y.o.-year-old female with right hip instability.  The risk and benefits of the procedure were discussed in detail and documented in the pre-operative evaluation.   PREOPERATIVE DIAGNOSIS: 1. Right hip instability with labral tear  POSTOPERATIVE DIAGNOSIS: Same.  PROCEDURE: 1. Right hip labral repari  SURGEON: Elspeth LITTIE Parker MD  ASSISTANT: Conley Dawson, ATC  ANESTHESIA:  general  IV FLUIDS AND URINE: See anesthesia record.  ANTIBIOTICS: Ancef   ESTIMATED BLOOD LOSS: 10 mL.  IMPLANTS:  * No implants in log *  DRAINS: None  CULTURES: None  COMPLICATIONS: none  DESCRIPTION OF PROCEDURE:   Cartilage Intact femoral and acetabular cartilage   Labrum Normplastic/torn appearing   Boundaries of labral tear Convention (3 o'clock anterior, 9 o'clock posterior) Anterior boundary: 3 o'clock Posterior boundary: 1 o'clock   OPERATIVE REPORT:  The patient was brought to the operating room, placed supine on the operating table, and bony prominences were padded.  The traction boots were applied with padding to ensure that safe traction could be applied through the feet.  The contralateral limb was abducted maximally and light traction was applied.  The operative leg was brought into neutral position.  The flouroscopic c-arm was brought between the legs for an AP image.  The patient was prepped and draped in a sterile fashion.  Time-out was performed and landmarks were identified. Traction was obtained and care was taken to ensure the least amount of force necessary to allow safe access to the joint of 8-2mm.  This was checked with fluoroscopy.    Next we placed an anterolateral portal under the assistance of fluoroscopy.  First, fluoroscopy was used to estimate the trajectory and starting point.  A 5mm incision with a #11 blade was made and a straight hemostat was used to dilate the portal through the appropriate  tract.  We then placed a 14-gauge hypodermic needle with careful technique to be as close to the femoral head as possible and parallel to the sorcele to ensure no iatrogenic damage to the labrum.  This released the negative pressure environment and the amount of traction was adjusted to maintain the 8-46mm of distraction.  A nitinol wire was placed through the needle and flouroscopy was used to ensure it extended to the medial wall of the acetabulum.  The Flowport from [employer] was placed over the wire and the nitinol wire was retracted to just inside the capsule during insertion of the dilator and cannula to minimize the risk of breakage. The arthroscope was placed next and we visualized the anterior triangle.     We then placed the anterior portal under direct visualization using the technique described above.  This was safely placed as well without damage to the labrum or femoral head.  We then switched our arthroscope to the anterior portal to ensure we were not through the labrum - we were safely through the capsule only.  We then proceeded with periportal capsulotomies utilizing the Samurai blade in each portal without connecting the two.  We identified the anterior inferior iliac spine proximally, the psoas tendon medially and the rectus tendon laterally as landmarks.  We then proceeded with a diagnostic arthroscopy - the results can be found in the findings section above.    We then used the radiofrequency device to clear the superior acetabulum and expose the subspinous region.  Next we exposed the acetabular rim leaving the chondral labral junction intact. The acetabular rim/subspinous region  was reshaped with 5.5 mm bur.   When adequate reshaping was obtained we then proceeded with the labral repair. We placed 2 anchors at the 1:00 and 2:30 positions. The sutures were passed using the crescent Nanopass from [employer].  This resulted in anatomic labral repair.  We debrided the loose  cartilage at the rim and residual degenerative labral tissue.  Traction was let down with total traction time of 24 minutes.     Finally, we performed a complete capsular closure with tape suture.  She was replaced in the anterior and posterior limb of the reported capsulotomy with excellent apposition. We then removed the arthroscope and closed the incisions with 3-0 nylon simple stitches.  A sterile dressing was applied..  The patient was awakened from anesthesia and transferred to PACU in stable condition. Postoperative care includes:       POSTOPERATIVE PLAN:    Weight bearing as tolerated operative extremity Formal physical therapy will begin immediately within the first weeks of surgery ASA 325 Daily for DVT prophylaxis      Elspeth LITTIE Parker, MD 11:17 AM

## 2024-07-02 NOTE — Anesthesia Postprocedure Evaluation (Signed)
 Anesthesia Post Note  Patient: Meghan Reed  Procedure(s) Performed: ARTHROSCOPY, HIP, WITH LABRUM REPAIR (Right: Hip)     Patient location during evaluation: PACU Anesthesia Type: General Level of consciousness: awake and alert and oriented Pain management: pain level controlled Vital Signs Assessment: post-procedure vital signs reviewed and stable Respiratory status: spontaneous breathing, nonlabored ventilation and respiratory function stable Cardiovascular status: blood pressure returned to baseline and stable Postop Assessment: no apparent nausea or vomiting Anesthetic complications: no   No notable events documented.  Last Vitals:  Vitals:   07/02/24 1200 07/02/24 1215  BP: 106/81 106/75  Pulse: 90 83  Resp: 14 17  Temp:    SpO2: 100% 100%    Last Pain:  Vitals:   07/02/24 1206  TempSrc:   PainSc: 7                  Yizel Canby A.

## 2024-07-02 NOTE — Anesthesia Procedure Notes (Signed)
 Procedure Name: Intubation Date/Time: 07/02/2024 10:18 AM  Performed by: Burnard Rosaline HERO, CRNAPre-anesthesia Checklist: Patient identified, Emergency Drugs available, Suction available and Patient being monitored Patient Re-evaluated:Patient Re-evaluated prior to induction Oxygen Delivery Method: Circle system utilized Preoxygenation: Pre-oxygenation with 100% oxygen Induction Type: IV induction Ventilation: Mask ventilation without difficulty Laryngoscope Size: Mac and 4 Grade View: Grade I Tube type: Oral Tube size: 7.0 mm Number of attempts: 1 Airway Equipment and Method: Stylet and Oral airway Placement Confirmation: ETT inserted through vocal cords under direct vision, positive ETCO2, breath sounds checked- equal and bilateral and CO2 detector Secured at: 22 cm Tube secured with: Tape Dental Injury: Teeth and Oropharynx as per pre-operative assessment

## 2024-07-02 NOTE — Discharge Instructions (Addendum)
 Discharge Instructions    Attending Surgeon: Elspeth Parker, MD Office Phone Number: [phone]   Diagnosis and Procedures:    Surgeries Performed: Right hip labral repair  Discharge Plan:    Diet: Resume usual diet. Begin with light or bland foods.  Drink plenty of fluids.  Activity:  Weight bearing as tolerated right leg. You are advised to go home directly from the hospital or surgical center. Restrict your activities.  GENERAL INSTRUCTIONS: 1.  Please apply ice to your wound to help with swelling and inflammation. This will improve your comfort and your overall recovery following surgery.     2. Please call Dr. Danetta office at [phone] with questions Monday-Friday during business hours. If no one answers, please leave a message and someone should get back to the patient within 24 hours. For emergencies please call 911 or proceed to the emergency room.   3. Patient to notify surgical team if experiences any of the following: Bowel/Bladder dysfunction, uncontrolled pain, nerve/muscle weakness, incision with increased drainage or redness, nausea/vomiting and Fever greater than 101.0 F.  Be alert for signs of infection including redness, streaking, odor, fever or chills. Be alert for excessive pain or bleeding and notify your surgeon immediately.  WOUND INSTRUCTIONS:   Leave your dressing, cast, or splint in place until your post operative visit.  Keep it clean and dry.  Always keep the incision clean and dry until the staples/sutures are removed. If there is no drainage from the incision you should keep it open to air. If there is drainage from the incision you must keep it covered at all times until the drainage stops  Do not soak in a bath tub, hot tub, pool, lake or other body of water until 21 days after your surgery and your incision is completely dry and healed.  If you have removable sutures (or staples) they must be removed 10-14 days (unless otherwise  instructed) from the day of your surgery.     1)  Elevate the extremity as much as possible.  2)  Keep the dressing clean and dry.  3)  Please call us  if the dressing becomes wet or dirty.  4)  If you are experiencing worsening pain or worsening swelling, please call.     MEDICATIONS: Resume all previous home medications at the previous prescribed dose and frequency unless otherwise noted Start taking the  pain medications on an as-needed basis as prescribed  Please taper down pain medication over the next week following surgery.  Ideally you should not require a refill of any narcotic pain medication.  Take pain medication with food to minimize nausea. In addition to the prescribed pain medication, you may take over-the-counter pain relievers such as Tylenol .  Do NOT take additional tylenol  if your pain medication already has tylenol  in it.  Okay to resume home Apixaban  on post op day 0. Narcotic policy: Per [facility] clinic policy, our goal is ensure optimal postoperative pain control with a multimodal pain management strategy. For all [facility] patients, our goal is to wean post-operative narcotic medications by 6 weeks post-operatively, and many times sooner. If this is not possible due to utilization of pain medication prior to surgery, your [facility] doctor will support your acute post-operative pain control for the first 6 weeks postoperatively, with a plan to transition you back to your primary pain team following that. Maralee will work to ensure a Therapist, occupational.       FOLLOWUP INSTRUCTIONS: 1. Follow up at  the Physical Therapy Clinic 3-4 days following surgery. This appointment should be scheduled unless other arrangements have been made.The Physical Therapy scheduling number is [phone] if an appointment has not already been arranged.  2. Contact Dr. Danetta office during office hours at [phone] or the practice after hours line at [phone] for non-emergencies.  For medical emergencies call 911.   Discharge Location: Home   No Tylenol  before 3:00pm   Post Anesthesia Home Care Instructions  Activity: Get plenty of rest for the remainder of the day. A responsible individual must stay with you for 24 hours following the procedure.  For the next 24 hours, DO NOT: -Drive a car -Advertising copywriter -Drink alcoholic beverages -Take any medication unless instructed by your physician -Make any legal decisions or sign important papers.  Meals: Start with liquid foods such as gelatin or soup. Progress to regular foods as tolerated. Avoid greasy, spicy, heavy foods. If nausea and/or vomiting occur, drink only clear liquids until the nausea and/or vomiting subsides. Call your physician if vomiting continues.  Special Instructions/Symptoms: Your throat may feel dry or sore from the anesthesia or the breathing tube placed in your throat during surgery. If this causes discomfort, gargle with warm salt water. The discomfort should disappear within 24 hours.

## 2024-07-02 NOTE — Brief Op Note (Signed)
   Brief Op Note  Date of Surgery: 07/02/2024  Preoperative Diagnosis: RIGHT HIP LABRAL TEAR  Postoperative Diagnosis: same  Procedure: Procedure(s): ARTHROSCOPY, HIP, WITH LABRUM REPAIR  Implants: * No implants in log *  Surgeons: Surgeon(s): Genelle Standing, MD  Anesthesia: General    Estimated Blood Loss: See anesthesia record  Complications: None  Condition to PACU: Stable  Standing LITTIE Genelle, MD 07/02/2024 11:17 AM'

## 2024-07-02 NOTE — Transfer of Care (Signed)
 Immediate Anesthesia Transfer of Care Note  Patient: Meghan Reed  Procedure(s) Performed: ARTHROSCOPY, HIP, WITH LABRUM REPAIR (Right: Hip)  Patient Location: PACU  Anesthesia Type:General  Level of Consciousness: awake and patient cooperative  Airway & Oxygen Therapy: Patient Spontanous Breathing and Patient connected to face mask oxygen  Post-op Assessment: Report given to RN and Post -op Vital signs reviewed and stable  Post vital signs: Reviewed and stable  Last Vitals:  Vitals Value Taken Time  BP 117/79 07/02/24 11:25  Temp    Pulse 102 07/02/24 11:27  Resp 10 07/02/24 11:27  SpO2 100 % 07/02/24 11:27  Vitals shown include unfiled device data.  Last Pain:  Vitals:   07/02/24 0851  TempSrc: Temporal  PainSc: 8       Patients Stated Pain Goal: 3 (07/02/24 0851)  Complications: No notable events documented.
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
## 2024-07-04 ENCOUNTER — Other Ambulatory Visit: Payer: Self-pay

## 2024-07-04 ENCOUNTER — Ambulatory Visit (HOSPITAL_BASED_OUTPATIENT_CLINIC_OR_DEPARTMENT_OTHER): Attending: Orthopaedic Surgery | Admitting: Physical Therapy

## 2024-07-04 DIAGNOSIS — R262 Difficulty in walking, not elsewhere classified: Secondary | ICD-10-CM | POA: Insufficient documentation

## 2024-07-04 DIAGNOSIS — M25551 Pain in right hip: Secondary | ICD-10-CM | POA: Diagnosis present

## 2024-07-04 DIAGNOSIS — M25651 Stiffness of right hip, not elsewhere classified: Secondary | ICD-10-CM | POA: Diagnosis present

## 2024-07-04 DIAGNOSIS — M6281 Muscle weakness (generalized): Secondary | ICD-10-CM | POA: Diagnosis present
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
## 2024-07-05 ENCOUNTER — Encounter (HOSPITAL_BASED_OUTPATIENT_CLINIC_OR_DEPARTMENT_OTHER): Payer: Self-pay | Admitting: Physical Therapy

## 2024-07-12 ENCOUNTER — Ambulatory Visit (HOSPITAL_BASED_OUTPATIENT_CLINIC_OR_DEPARTMENT_OTHER): Admitting: Physical Therapy

## 2024-07-12 ENCOUNTER — Encounter (HOSPITAL_BASED_OUTPATIENT_CLINIC_OR_DEPARTMENT_OTHER): Payer: Self-pay | Admitting: Physical Therapy

## 2024-07-12 ENCOUNTER — Ambulatory Visit (HOSPITAL_BASED_OUTPATIENT_CLINIC_OR_DEPARTMENT_OTHER): Admitting: Orthopaedic Surgery

## 2024-07-12 DIAGNOSIS — M25651 Stiffness of right hip, not elsewhere classified: Secondary | ICD-10-CM

## 2024-07-12 DIAGNOSIS — M25551 Pain in right hip: Secondary | ICD-10-CM | POA: Diagnosis not present

## 2024-07-12 DIAGNOSIS — R262 Difficulty in walking, not elsewhere classified: Secondary | ICD-10-CM

## 2024-07-12 DIAGNOSIS — M6281 Muscle weakness (generalized): Secondary | ICD-10-CM
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
## 2024-07-15 ENCOUNTER — Encounter (HOSPITAL_BASED_OUTPATIENT_CLINIC_OR_DEPARTMENT_OTHER): Payer: Self-pay | Admitting: Orthopaedic Surgery

## 2024-07-19 ENCOUNTER — Encounter (HOSPITAL_BASED_OUTPATIENT_CLINIC_OR_DEPARTMENT_OTHER): Payer: Self-pay | Admitting: Physical Therapy

## 2024-07-19 ENCOUNTER — Ambulatory Visit (HOSPITAL_BASED_OUTPATIENT_CLINIC_OR_DEPARTMENT_OTHER): Admitting: Physical Therapy

## 2024-07-19 DIAGNOSIS — M25651 Stiffness of right hip, not elsewhere classified: Secondary | ICD-10-CM

## 2024-07-19 DIAGNOSIS — M6281 Muscle weakness (generalized): Secondary | ICD-10-CM

## 2024-07-19 DIAGNOSIS — M25551 Pain in right hip: Secondary | ICD-10-CM

## 2024-07-19 DIAGNOSIS — R262 Difficulty in walking, not elsewhere classified: Secondary | ICD-10-CM
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
## 2024-07-23 ENCOUNTER — Other Ambulatory Visit (HOSPITAL_BASED_OUTPATIENT_CLINIC_OR_DEPARTMENT_OTHER): Payer: Self-pay | Admitting: Orthopaedic Surgery

## 2024-07-23 ENCOUNTER — Telehealth: Payer: Self-pay | Admitting: Orthopaedic Surgery

## 2024-07-23 NOTE — Telephone Encounter (Signed)
 [place] form faxed from landline [phone].

## 2024-07-26 ENCOUNTER — Encounter (HOSPITAL_BASED_OUTPATIENT_CLINIC_OR_DEPARTMENT_OTHER): Payer: Self-pay | Admitting: Physical Therapy

## 2024-07-26 ENCOUNTER — Ambulatory Visit (HOSPITAL_BASED_OUTPATIENT_CLINIC_OR_DEPARTMENT_OTHER): Admitting: Physical Therapy

## 2024-07-26 DIAGNOSIS — M25651 Stiffness of right hip, not elsewhere classified: Secondary | ICD-10-CM

## 2024-07-26 DIAGNOSIS — M6281 Muscle weakness (generalized): Secondary | ICD-10-CM

## 2024-07-26 DIAGNOSIS — M25551 Pain in right hip: Secondary | ICD-10-CM | POA: Diagnosis not present

## 2024-07-26 DIAGNOSIS — R262 Difficulty in walking, not elsewhere classified: Secondary | ICD-10-CM
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
## 2024-07-27 ENCOUNTER — Encounter (HOSPITAL_BASED_OUTPATIENT_CLINIC_OR_DEPARTMENT_OTHER): Payer: Self-pay | Admitting: Emergency Medicine

## 2024-07-27 ENCOUNTER — Other Ambulatory Visit: Payer: Self-pay

## 2024-07-27 ENCOUNTER — Emergency Department (HOSPITAL_BASED_OUTPATIENT_CLINIC_OR_DEPARTMENT_OTHER)

## 2024-07-27 ENCOUNTER — Emergency Department (HOSPITAL_BASED_OUTPATIENT_CLINIC_OR_DEPARTMENT_OTHER)
Admission: EM | Admit: 2024-07-27 | Discharge: 2024-07-27 | Disposition: A | Discharge status: Home / Self Care | Attending: Emergency Medicine | Admitting: Emergency Medicine

## 2024-07-27 DIAGNOSIS — R5383 Other fatigue: Secondary | ICD-10-CM | POA: Insufficient documentation

## 2024-07-27 DIAGNOSIS — R110 Nausea: Secondary | ICD-10-CM | POA: Diagnosis not present

## 2024-07-27 DIAGNOSIS — R1013 Epigastric pain: Secondary | ICD-10-CM | POA: Diagnosis not present

## 2024-07-27 DIAGNOSIS — R197 Diarrhea, unspecified: Secondary | ICD-10-CM | POA: Diagnosis present

## 2024-07-27 DIAGNOSIS — Z7901 Long term (current) use of anticoagulants: Secondary | ICD-10-CM | POA: Diagnosis not present

## 2024-07-27 LAB — C-REACTIVE PROTEIN: CRP: 0.5 mg/dL (ref <1.0)

## 2024-07-27 LAB — URINALYSIS, ROUTINE W REFLEX MICROSCOPIC
Bacteria, UA: NONE SEEN
Bilirubin Urine: NEGATIVE
Glucose, UA: NEGATIVE mg/dL
Ketones, ur: 15 mg/dL — AB
Leukocytes,Ua: NEGATIVE
Nitrite: NEGATIVE
Protein, ur: NEGATIVE mg/dL
Specific Gravity, Urine: 1.013 (ref 1.005–1.030)
pH: 6 (ref 5.0–8.0)

## 2024-07-27 LAB — CBG MONITORING, ED: Glucose-Capillary: 109 mg/dL — ABNORMAL HIGH (ref 70–99)

## 2024-07-27 LAB — COMPREHENSIVE METABOLIC PANEL WITH GFR
ALT: 18 U/L (ref 0–44)
AST: 19 U/L (ref 15–41)
Albumin: 4.7 g/dL (ref 3.5–5.0)
Alkaline Phosphatase: 49 U/L (ref 38–126)
Anion gap: 13 (ref 5–15)
BUN: 9 mg/dL (ref 6–20)
CO2: 26 mmol/L (ref 22–32)
Calcium: 10 mg/dL (ref 8.9–10.3)
Chloride: 101 mmol/L (ref 98–111)
Creatinine, Ser: 0.78 mg/dL (ref 0.44–1.00)
GFR, Estimated: >60 mL/min (ref >60)
Glucose, Bld: 89 mg/dL (ref 70–99)
Potassium: 3.5 mmol/L (ref 3.5–5.1)
Sodium: 140 mmol/L (ref 135–145)
Total Bilirubin: 0.6 mg/dL (ref 0.0–1.2)
Total Protein: 7.7 g/dL (ref 6.5–8.1)

## 2024-07-27 LAB — CBC
HCT: 40 % (ref 36.0–46.0)
Hemoglobin: 13.7 g/dL (ref 12.0–15.0)
MCH: 30.6 pg (ref 26.0–34.0)
MCHC: 34.3 g/dL (ref 30.0–36.0)
MCV: 89.5 fL (ref 80.0–100.0)
Platelets: 263 K/uL (ref 150–400)
RBC: 4.47 MIL/uL (ref 3.87–5.11)
RDW: 12.3 % (ref 11.5–15.5)
WBC: 7.1 K/uL (ref 4.0–10.5)
nRBC: 0 % (ref 0.0–0.2)

## 2024-07-27 LAB — LIPASE, BLOOD: Lipase: 42 U/L (ref 11–51)

## 2024-07-27 LAB — SEDIMENTATION RATE: Sed Rate: 17 mm/h (ref 0–22)

## 2024-07-27 MED ORDER — ONDANSETRON 4 MG PO TBDP
4.0000 mg | ORAL_TABLET | Freq: Three times a day (TID) | ORAL | 0 refills | Status: AC | PRN
  Filled 2024-07-27: qty 12, 4d supply, fill #0

## 2024-07-27 MED ORDER — ONDANSETRON HCL 4 MG/2ML IJ SOLN
4.0000 mg | Freq: Once | INTRAMUSCULAR | Status: AC
  Administered 2024-07-27: 4 mg via INTRAVENOUS
  Filled 2024-07-27: qty 2

## 2024-07-27 MED ORDER — IOHEXOL 300 MG/ML  SOLN
100.0000 mL | Freq: Once | INTRAMUSCULAR | Status: AC | PRN
  Administered 2024-07-27: 100 mL via INTRAVENOUS

## 2024-07-27 MED ORDER — LACTATED RINGERS IV BOLUS
1000.0000 mL | Freq: Once | INTRAVENOUS | Status: AC
  Administered 2024-07-27: 1000 mL via INTRAVENOUS

## 2024-07-27 NOTE — ED Provider Notes (Signed)
 [place] EMERGENCY DEPARTMENT AT [facility] Provider Note   CSN: [id number] Arrival date & time: 07/27/24  1358     Patient presents with: Nausea   Meghan Reed is a 38 y.o. female.  {Add pertinent medical, surgical, social history, OB history to HPI:2343} 38 year old female with a history of hysterectomy and PE on Eliquis  who presents to the emergency department with diarrhea, nausea, and generalized fatigue.  Patient reports that over the past few weeks she has been having 4-5 loose stools per day.  Often appear oily and yellow.  Has had diffuse intermittent abdominal pains as well.  Started developing generalized fatigue and is very nauseous but no vomiting.  No fevers.  No sick contacts.  No travel out of the country recently.  No history of inflammatory bowel disease.  No marijuana use.       Prior to Admission medications   Medication Sig Start Date End Date Taking? Authorizing Provider  albuterol  (PROVENTIL ) (2.5 MG/3ML) 0.083% nebulizer solution Take 3 mLs (2.5 mg total) by nebulization every 6 (six) hours as needed for wheezing or shortness of breath. 01/18/24   Elnor Lauraine BRAVO, NP  albuterol  (VENTOLIN  HFA) 108 (90 Base) MCG/ACT inhaler Inhale 2 puffs into the lungs every 4 (four) hours as needed for wheezing or shortness of breath. 01/18/24   Elnor Lauraine BRAVO, NP  apixaban  (ELIQUIS ) 2.5 MG TABS tablet Take 1 tablet (2.5 mg total) by mouth 2 (two) times daily. 04/29/24   Dorsey, John T IV, MD  azelastine  (ASTELIN ) 0.1 % nasal spray Place 1 spray into both nostrils 2 (two) times daily. Use in each nostril as directed 06/05/24   Desai, Nikita S, MD  budesonide -formoterol  (SYMBICORT ) 160-4.5 MCG/ACT inhaler Inhale 2 puffs into the lungs 2 (two) times daily. 01/18/24   Elnor Lauraine BRAVO, NP  cetirizine  (ZYRTEC ) 10 MG tablet Take 1 tablet (10 mg total) by mouth daily. 06/05/24   Meade Verdon RAMAN, MD  cyclobenzaprine  (FLEXERIL ) 10 MG tablet Take 1 tablet (10 mg total) by  mouth every 8 (eight) hours as needed for muscle spasms. 07/04/23   Marlee Lynwood NOVAK, MD  ferrous sulfate  (FEROSUL) 325 (65 FE) MG tablet Take 1 tablet (325 mg total) by mouth daily with breakfast. Please take with a source of Vitamin C 06/17/24   Dorsey, John T IV, MD  Galcanezumab -gnlm (EMGALITY ) 120 MG/ML SOAJ Inject 120 mg into the skin every 30 days. 05/23/23   Lomax, Amy, NP  omeprazole  (PRILOSEC ) 20 MG capsule Take 1 capsule (20 mg total) by mouth daily. 06/17/24   West, Katlyn D, NP  oxyCODONE  (ROXICODONE ) 5 MG immediate release tablet Take 1 tablet (5 mg total) by mouth every 4 (four) hours as needed for severe pain (pain score 7-10) or breakthrough pain. 05/22/24   Genelle Standing, MD  Rimegepant Sulfate  (NURTEC) 75 MG TBDP Dissolve 1 tablet (75 mg total) by mouth daily as needed (take for abortive therapy of migraine, no more than 1 tablet in 24 hours or 10 per month). 05/23/24   Lomax, Amy, NP    Allergies: Tomato, Aspirin , Bee pollen, Bee venom, Nsaids, and Tape    Review of Systems  Updated Vital Signs BP 115/89   Pulse 86   Temp 98 F (36.7 C) (Oral)   Resp 16   Ht 5' 6 (1.676 m)   Wt 84.4 kg   LMP  (LMP Unknown)   SpO2 100%   BMI 30.02 kg/m   Physical Exam Vitals and nursing note  reviewed.  Constitutional:      General: She is not in acute distress.    Appearance: She is well-developed.  HENT:     Head: Normocephalic and atraumatic.     Right Ear: External ear normal.     Left Ear: External ear normal.     Nose: Nose normal.  Eyes:     Extraocular Movements: Extraocular movements intact.     Conjunctiva/sclera: Conjunctivae normal.     Pupils: Pupils are equal, round, and reactive to light.  Cardiovascular:     Rate and Rhythm: Normal rate and regular rhythm.     Heart sounds: No murmur heard. Pulmonary:     Effort: Pulmonary effort is normal. No respiratory distress.     Breath sounds: Normal breath sounds.  Abdominal:     General: Abdomen is flat. There is no  distension.     Palpations: Abdomen is soft. There is no mass.     Tenderness: There is abdominal tenderness (Epigastrium). There is no guarding.  Musculoskeletal:     Cervical back: Normal range of motion and neck supple.  Skin:    General: Skin is warm and dry.  Neurological:     Mental Status: She is alert and oriented to person, place, and time. Mental status is at baseline.  Psychiatric:        Mood and Affect: Mood normal.     (all labs ordered are listed, but only abnormal results are displayed) Labs Reviewed  CBG MONITORING, ED - Abnormal; Notable for the following components:      Result Value   Glucose-Capillary 109 (*)    All other components within normal limits  LIPASE, BLOOD  COMPREHENSIVE METABOLIC PANEL WITH GFR  CBC  URINALYSIS, ROUTINE W REFLEX MICROSCOPIC    EKG: None  Radiology: No results found.  {Document cardiac monitor, telemetry assessment procedure when appropriate:32947} Procedures   Medications Ordered in the ED - No data to display    {Click here for ABCD2, HEART and other calculators REFRESH Note before signing:1}                              Medical Decision Making Amount and/or Complexity of Data Reviewed Labs: ordered. Radiology: ordered.  Risk Prescription drug management.   ***  {Document critical care time when appropriate  Document review of labs and clinical decision tools ie CHADS2VASC2, etc  Document your independent review of radiology images and any outside records  Document your discussion with family members, caretakers and with consultants  Document social determinants of health affecting pt's care  Document your decision making why or why not admission, treatments were needed:32947:::1}   Final diagnoses:  None    ED Discharge Orders     None

## 2024-07-27 NOTE — ED Triage Notes (Signed)
 Pt via POV c/o nausea, fatigue, decreased appetite. Pt had hip surgery 9/5 and has not felt well since. Pt also reports oily, yellow stool.

## 2024-07-27 NOTE — Discharge Instructions (Signed)
 You were seen for your nausea and diarrhea in the emergency department.  At home, please take the Zofran  for your nausea and vomiting. Please be sure to stay well-hydrated.  Follow-up with your primary doctor in 2-3 days regarding your visit.  Return immediately to the emergency department if you experience any of the following: fainting, abdominal pain, high fevers, or any other concerning symptoms.  Thank you for visiting our Emergency Department. It was a pleasure taking care of you today.

## 2024-07-28 ENCOUNTER — Other Ambulatory Visit (HOSPITAL_COMMUNITY): Payer: Self-pay

## 2024-07-29 ENCOUNTER — Ambulatory Visit (HOSPITAL_COMMUNITY): Admission: EM | Admit: 2024-07-29 | Discharge: 2024-07-29 | Disposition: A | Discharge status: Home / Self Care

## 2024-07-29 ENCOUNTER — Encounter (HOSPITAL_COMMUNITY): Payer: Self-pay

## 2024-07-29 DIAGNOSIS — R10819 Abdominal tenderness, unspecified site: Secondary | ICD-10-CM | POA: Insufficient documentation

## 2024-07-29 LAB — POCT URINALYSIS DIP (MANUAL ENTRY)
Bilirubin, UA: NEGATIVE
Glucose, UA: NEGATIVE mg/dL
Ketones, POC UA: NEGATIVE mg/dL
Leukocytes, UA: NEGATIVE
Nitrite, UA: NEGATIVE
Spec Grav, UA: 1.025 (ref 1.010–1.025)
Urobilinogen, UA: 0.2 U/dL
pH, UA: 6.5 (ref 5.0–8.0)
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
## 2024-07-29 NOTE — Discharge Instructions (Addendum)
 Wait for results of vaginal swab to come back prior to initiating treatment given mild symptoms.  We will call you over the phone with results. Consider taking over-the-counter Azo to see if this helps with symptom management Follow-up with primary care provider or urgent care as needed

## 2024-07-29 NOTE — ED Triage Notes (Addendum)
 Patient c/o lower abdominal pain, whte vaginal discharge with an odor x 1 week. Patient added that she was having urinary frequency.

## 2024-07-30 LAB — CERVICOVAGINAL ANCILLARY ONLY
Bacterial Vaginitis (gardnerella): POSITIVE — AB
Candida Glabrata: NEGATIVE
Candida Vaginitis: NEGATIVE
Chlamydia: NEGATIVE
Comment: NEGATIVE
Comment: NEGATIVE
Comment: NEGATIVE
Comment: NEGATIVE
Comment: NEGATIVE
Comment: NORMAL
Neisseria Gonorrhea: NEGATIVE
Trichomonas: NEGATIVE

## 2024-07-31 ENCOUNTER — Ambulatory Visit (HOSPITAL_COMMUNITY): Payer: Self-pay

## 2024-07-31 ENCOUNTER — Encounter (HOSPITAL_BASED_OUTPATIENT_CLINIC_OR_DEPARTMENT_OTHER): Payer: Self-pay | Admitting: Physical Therapy

## 2024-07-31 ENCOUNTER — Ambulatory Visit (HOSPITAL_BASED_OUTPATIENT_CLINIC_OR_DEPARTMENT_OTHER): Attending: Orthopaedic Surgery | Admitting: Physical Therapy

## 2024-07-31 ENCOUNTER — Other Ambulatory Visit (HOSPITAL_COMMUNITY): Payer: Self-pay

## 2024-07-31 DIAGNOSIS — M25551 Pain in right hip: Secondary | ICD-10-CM | POA: Diagnosis present

## 2024-07-31 DIAGNOSIS — M6281 Muscle weakness (generalized): Secondary | ICD-10-CM | POA: Insufficient documentation

## 2024-07-31 DIAGNOSIS — M25651 Stiffness of right hip, not elsewhere classified: Secondary | ICD-10-CM | POA: Diagnosis present

## 2024-07-31 DIAGNOSIS — R262 Difficulty in walking, not elsewhere classified: Secondary | ICD-10-CM | POA: Insufficient documentation

## 2024-07-31 MED ORDER — FLUCONAZOLE 150 MG PO TABS
150.0000 mg | ORAL_TABLET | Freq: Once | ORAL | 0 refills | Status: AC
  Filled 2024-07-31: qty 2, 2d supply, fill #0

## 2024-07-31 MED ORDER — METRONIDAZOLE 500 MG PO TABS
500.0000 mg | ORAL_TABLET | Freq: Two times a day (BID) | ORAL | 0 refills | Status: AC
  Filled 2024-07-31: qty 14, 7d supply, fill #0
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
## 2024-08-02 ENCOUNTER — Encounter (HOSPITAL_BASED_OUTPATIENT_CLINIC_OR_DEPARTMENT_OTHER): Payer: Self-pay

## 2024-08-02 ENCOUNTER — Ambulatory Visit (HOSPITAL_BASED_OUTPATIENT_CLINIC_OR_DEPARTMENT_OTHER)

## 2024-08-02 DIAGNOSIS — M25551 Pain in right hip: Secondary | ICD-10-CM

## 2024-08-02 DIAGNOSIS — M6281 Muscle weakness (generalized): Secondary | ICD-10-CM

## 2024-08-02 DIAGNOSIS — M25651 Stiffness of right hip, not elsewhere classified: Secondary | ICD-10-CM

## 2024-08-02 DIAGNOSIS — R262 Difficulty in walking, not elsewhere classified: Secondary | ICD-10-CM
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
## 2024-08-06 ENCOUNTER — Telehealth: Payer: Self-pay | Admitting: Family Medicine

## 2024-08-06 NOTE — Telephone Encounter (Signed)
 Submitted auth request to [facility] over the phone with pharmacy rep Schuyler. She states shara is pending clinical review, faxed OV notes to [phone].

## 2024-08-07 ENCOUNTER — Encounter (HOSPITAL_BASED_OUTPATIENT_CLINIC_OR_DEPARTMENT_OTHER): Payer: Self-pay

## 2024-08-07 ENCOUNTER — Ambulatory Visit (HOSPITAL_BASED_OUTPATIENT_CLINIC_OR_DEPARTMENT_OTHER)

## 2024-08-09 ENCOUNTER — Encounter (HOSPITAL_BASED_OUTPATIENT_CLINIC_OR_DEPARTMENT_OTHER): Payer: Self-pay | Admitting: Physical Therapy

## 2024-08-09 ENCOUNTER — Ambulatory Visit (HOSPITAL_BASED_OUTPATIENT_CLINIC_OR_DEPARTMENT_OTHER): Admitting: Physical Therapy

## 2024-08-09 DIAGNOSIS — M25551 Pain in right hip: Secondary | ICD-10-CM

## 2024-08-09 DIAGNOSIS — M25651 Stiffness of right hip, not elsewhere classified: Secondary | ICD-10-CM

## 2024-08-09 DIAGNOSIS — M6281 Muscle weakness (generalized): Secondary | ICD-10-CM

## 2024-08-09 DIAGNOSIS — R262 Difficulty in walking, not elsewhere classified: Secondary | ICD-10-CM
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
## 2024-08-14 ENCOUNTER — Ambulatory Visit (HOSPITAL_BASED_OUTPATIENT_CLINIC_OR_DEPARTMENT_OTHER): Admitting: Physical Therapy

## 2024-08-14 ENCOUNTER — Encounter (HOSPITAL_BASED_OUTPATIENT_CLINIC_OR_DEPARTMENT_OTHER): Payer: Self-pay | Admitting: Physical Therapy

## 2024-08-14 ENCOUNTER — Ambulatory Visit
Admission: EM | Admit: 2024-08-14 | Discharge: 2024-08-14 | Disposition: A | Discharge status: Home / Self Care | Attending: Family Medicine | Admitting: Family Medicine

## 2024-08-14 ENCOUNTER — Other Ambulatory Visit (HOSPITAL_COMMUNITY): Payer: Self-pay

## 2024-08-14 ENCOUNTER — Ambulatory Visit (INDEPENDENT_AMBULATORY_CARE_PROVIDER_SITE_OTHER): Admitting: Orthopaedic Surgery

## 2024-08-14 DIAGNOSIS — M6281 Muscle weakness (generalized): Secondary | ICD-10-CM

## 2024-08-14 DIAGNOSIS — B370 Candidal stomatitis: Secondary | ICD-10-CM

## 2024-08-14 DIAGNOSIS — M25551 Pain in right hip: Secondary | ICD-10-CM

## 2024-08-14 DIAGNOSIS — R262 Difficulty in walking, not elsewhere classified: Secondary | ICD-10-CM

## 2024-08-14 DIAGNOSIS — M25651 Stiffness of right hip, not elsewhere classified: Secondary | ICD-10-CM

## 2024-08-14 MED ORDER — LIDOCAINE HCL 1 % IJ SOLN
4.0000 mL | INTRAMUSCULAR | Status: AC | PRN
  Administered 2024-08-14: 4 mL

## 2024-08-14 MED ORDER — FLUCONAZOLE 100 MG PO TABS
ORAL_TABLET | ORAL | 0 refills | Status: DC
  Filled 2024-08-14: qty 15, 14d supply, fill #0

## 2024-08-14 MED ORDER — TRIAMCINOLONE ACETONIDE 40 MG/ML IJ SUSP
80.0000 mg | INTRAMUSCULAR | Status: AC | PRN
  Administered 2024-08-14: 80 mg via INTRA_ARTICULAR
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
## 2024-08-14 NOTE — ED Triage Notes (Signed)
 Pt present with c/o possible thrush. States she was taking diflucan  and states it has not improved her symptom. Pt states she wants something different to help relieve the soreness in her throat.
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
## 2024-08-16 ENCOUNTER — Encounter (HOSPITAL_BASED_OUTPATIENT_CLINIC_OR_DEPARTMENT_OTHER): Payer: Self-pay | Admitting: Physical Therapy

## 2024-08-16 ENCOUNTER — Ambulatory Visit (HOSPITAL_BASED_OUTPATIENT_CLINIC_OR_DEPARTMENT_OTHER): Payer: Self-pay | Admitting: Physical Therapy

## 2024-08-16 DIAGNOSIS — R262 Difficulty in walking, not elsewhere classified: Secondary | ICD-10-CM

## 2024-08-16 DIAGNOSIS — M6281 Muscle weakness (generalized): Secondary | ICD-10-CM

## 2024-08-16 DIAGNOSIS — M25551 Pain in right hip: Secondary | ICD-10-CM | POA: Diagnosis not present

## 2024-08-16 DIAGNOSIS — M25651 Stiffness of right hip, not elsewhere classified: Secondary | ICD-10-CM
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
## 2024-08-20 NOTE — Therapy (Unsigned)
  Pt arrived at PT with complaints of calf pain. No streaking or redness noted, but pt has a history of DVT's and is currently not taking meds. Pt was advised to go upstairs for a US  to assess calf.

## 2024-08-21 ENCOUNTER — Ambulatory Visit (HOSPITAL_BASED_OUTPATIENT_CLINIC_OR_DEPARTMENT_OTHER): Admitting: Physical Therapy

## 2024-08-21 ENCOUNTER — Ambulatory Visit (HOSPITAL_BASED_OUTPATIENT_CLINIC_OR_DEPARTMENT_OTHER)
Admission: RE | Admit: 2024-08-21 | Discharge: 2024-08-21 | Disposition: A | Discharge status: Home / Self Care | Source: Ambulatory Visit | Attending: Student | Admitting: Student

## 2024-08-21 ENCOUNTER — Ambulatory Visit (INDEPENDENT_AMBULATORY_CARE_PROVIDER_SITE_OTHER): Admitting: Student

## 2024-08-21 DIAGNOSIS — M79662 Pain in left lower leg: Secondary | ICD-10-CM | POA: Insufficient documentation

## 2024-08-21 DIAGNOSIS — M25551 Pain in right hip: Secondary | ICD-10-CM

## 2024-08-21 DIAGNOSIS — M25651 Stiffness of right hip, not elsewhere classified: Secondary | ICD-10-CM

## 2024-08-21 DIAGNOSIS — R262 Difficulty in walking, not elsewhere classified: Secondary | ICD-10-CM

## 2024-08-21 DIAGNOSIS — M6281 Muscle weakness (generalized): Secondary | ICD-10-CM
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
## 2024-08-22 ENCOUNTER — Emergency Department (HOSPITAL_BASED_OUTPATIENT_CLINIC_OR_DEPARTMENT_OTHER)

## 2024-08-22 ENCOUNTER — Encounter: Payer: Self-pay | Admitting: Hematology and Oncology

## 2024-08-22 ENCOUNTER — Emergency Department (HOSPITAL_BASED_OUTPATIENT_CLINIC_OR_DEPARTMENT_OTHER): Admitting: Radiology

## 2024-08-22 ENCOUNTER — Telehealth: Payer: Self-pay | Admitting: *Deleted

## 2024-08-22 ENCOUNTER — Other Ambulatory Visit (HOSPITAL_BASED_OUTPATIENT_CLINIC_OR_DEPARTMENT_OTHER): Payer: Self-pay

## 2024-08-22 ENCOUNTER — Other Ambulatory Visit: Payer: Self-pay

## 2024-08-22 ENCOUNTER — Emergency Department (HOSPITAL_BASED_OUTPATIENT_CLINIC_OR_DEPARTMENT_OTHER)
Admission: EM | Admit: 2024-08-22 | Discharge: 2024-08-22 | Disposition: A | Discharge status: Home / Self Care | Attending: Emergency Medicine | Admitting: Emergency Medicine

## 2024-08-22 ENCOUNTER — Encounter (HOSPITAL_BASED_OUTPATIENT_CLINIC_OR_DEPARTMENT_OTHER): Payer: Self-pay | Admitting: Emergency Medicine

## 2024-08-22 DIAGNOSIS — J45909 Unspecified asthma, uncomplicated: Secondary | ICD-10-CM | POA: Diagnosis not present

## 2024-08-22 DIAGNOSIS — I82452 Acute embolism and thrombosis of left peroneal vein: Secondary | ICD-10-CM | POA: Diagnosis not present

## 2024-08-22 DIAGNOSIS — M79605 Pain in left leg: Secondary | ICD-10-CM | POA: Diagnosis present

## 2024-08-22 DIAGNOSIS — Z7901 Long term (current) use of anticoagulants: Secondary | ICD-10-CM | POA: Diagnosis not present

## 2024-08-22 LAB — CBC
HCT: 41.6 % (ref 36.0–46.0)
Hemoglobin: 14.3 g/dL (ref 12.0–15.0)
MCH: 31.2 pg (ref 26.0–34.0)
MCHC: 34.4 g/dL (ref 30.0–36.0)
MCV: 90.8 fL (ref 80.0–100.0)
Platelets: 296 K/uL (ref 150–400)
RBC: 4.58 MIL/uL (ref 3.87–5.11)
RDW: 12.8 % (ref 11.5–15.5)
WBC: 8.8 K/uL (ref 4.0–10.5)
nRBC: 0 % (ref 0.0–0.2)

## 2024-08-22 LAB — BASIC METABOLIC PANEL WITH GFR
Anion gap: 10 (ref 5–15)
BUN: 9 mg/dL (ref 6–20)
CO2: 27 mmol/L (ref 22–32)
Calcium: 10.4 mg/dL — ABNORMAL HIGH (ref 8.9–10.3)
Chloride: 99 mmol/L (ref 98–111)
Creatinine, Ser: 0.87 mg/dL (ref 0.44–1.00)
GFR, Estimated: >60 mL/min (ref >60)
Glucose, Bld: 96 mg/dL (ref 70–99)
Potassium: 3.8 mmol/L (ref 3.5–5.1)
Sodium: 135 mmol/L (ref 135–145)

## 2024-08-22 LAB — TROPONIN T, HIGH SENSITIVITY: Troponin T High Sensitivity: <15 ng/L (ref 0–19)

## 2024-08-22 MED ORDER — APIXABAN (ELIQUIS) VTE STARTER PACK (10MG AND 5MG)
ORAL_TABLET | ORAL | 0 refills | Status: DC
  Filled 2024-08-22: qty 74, 30d supply, fill #0

## 2024-08-22 MED ORDER — IOHEXOL 350 MG/ML SOLN
100.0000 mL | Freq: Once | INTRAVENOUS | Status: AC | PRN
  Administered 2024-08-22: 100 mL via INTRAVENOUS

## 2024-08-22 NOTE — Telephone Encounter (Signed)
 Received call from pt. She states she was diagnosed with a LE DVT yesterday. She was told to contact Dr. Federico regarding her anticoagulant. She is currently on Eliquis  2.5 mg BID. Today she states she is feeling SOB. Advised that she go to the ED due to the SOB, that it is best to evaluate that in the ED. Advised that I would let Dr. Federico know of her situation and get a follow up appt made for her. Pt voiced understanding. She will go to the ED @ [facility]. Discussed situation with Dr. Federico. He agreed with going to the ED. He also recommends she increase her Eliquis  back to 5 mg BID for now. Pt made aware of this.

## 2024-08-22 NOTE — ED Triage Notes (Signed)
 Pt c/o shob x 2 days, palpitations x 3 days, RT side CP starting last night, recent dx with dvt in LLE. Also reports numbness and tingling in LLE. Pt ambulatory to triage.

## 2024-08-22 NOTE — ED Provider Notes (Signed)
 [place] [facility] AT [facility] Provider Note   CSN: [id number] Arrival date & time: 08/22/24  1137     Patient presents with: Shortness of Breath   Meghan Reed is a 38 y.o. female.   Pt is a 38 yo female with pmhx significant for migraines, anemia, asthma, PTSD, GERD, and PE/DVT.  Pt has been on Eliquis , but has not been taking it regularly.  She is 7 weeks out a right hip labral repair.  She went to ortho yesterday for leg pain and had an outpatient US  which showed DVT.  She did not tell me she had an US  yesterday, however.  She developed SOB and right sided CP c/w prior PE.  She called her hematologist who recommended going to the ED for further eval.  Pt denies f/c. She feels tired.        Prior to Admission medications   Medication Sig Start Date End Date Taking? Authorizing Provider  APIXABAN  (ELIQUIS ) VTE STARTER PACK (10MG  AND 5MG ) Take as directed on package: start with two-5mg  tablets twice daily for 7 days. On day 8, switch to one-5mg  tablet twice daily. 08/22/24  Yes Dean Clarity, MD  albuterol  (PROVENTIL ) (2.5 MG/3ML) 0.083% nebulizer solution Take 3 mLs (2.5 mg total) by nebulization every 6 (six) hours as needed for wheezing or shortness of breath. 01/18/24   Elnor Lauraine BRAVO, NP  albuterol  (VENTOLIN  HFA) 108 (90 Base) MCG/ACT inhaler Inhale 2 puffs into the lungs every 4 (four) hours as needed for wheezing or shortness of breath. 01/18/24   Elnor Lauraine BRAVO, NP  azelastine  (ASTELIN ) 0.1 % nasal spray Place 1 spray into both nostrils 2 (two) times daily. Use in each nostril as directed 06/05/24   Desai, Nikita S, MD  budesonide -formoterol  (SYMBICORT ) 160-4.5 MCG/ACT inhaler Inhale 2 puffs into the lungs 2 (two) times daily. 01/18/24   Elnor Lauraine BRAVO, NP  cetirizine  (ZYRTEC ) 10 MG tablet Take 1 tablet (10 mg total) by mouth daily. 06/05/24   Meade Verdon RAMAN, MD  cyclobenzaprine  (FLEXERIL ) 10 MG tablet Take 1 tablet (10 mg total) by mouth  every 8 (eight) hours as needed for muscle spasms. 07/04/23   Marlee Lynwood NOVAK, MD  ferrous sulfate  (FEROSUL) 325 (65 FE) MG tablet Take 1 tablet (325 mg total) by mouth daily with breakfast. Please take with a source of Vitamin C 06/17/24   Dorsey, John T IV, MD  fluconazole  (DIFLUCAN ) 100 MG tablet Day 1: Take 2 tablets daily. Day 2-14: Take 1 tablet daily. 08/14/24   Christopher Savannah, PA-C  Galcanezumab -gnlm (EMGALITY ) 120 MG/ML SOAJ Inject 120 mg into the skin every 30 days. 05/23/23   Lomax, Amy, NP  omeprazole  (PRILOSEC ) 20 MG capsule Take 1 capsule (20 mg total) by mouth daily. 06/17/24   West, Katlyn D, NP  ondansetron  (ZOFRAN -ODT) 4 MG disintegrating tablet Take 1 tablet (4 mg total) by mouth every 8 (eight) hours as needed for nausea or vomiting. 07/27/24   Yolande Lamar BROCKS, MD  oxyCODONE  (ROXICODONE ) 5 MG immediate release tablet Take 1 tablet (5 mg total) by mouth every 4 (four) hours as needed for severe pain (pain score 7-10) or breakthrough pain. 05/22/24   Genelle Standing, MD  Rimegepant Sulfate  (NURTEC) 75 MG TBDP Dissolve 1 tablet (75 mg total) by mouth daily as needed (take for abortive therapy of migraine, no more than 1 tablet in 24 hours or 10 per month). 05/23/24   Lomax, Amy, NP    Allergies: Tomato, Aspirin , Bee  pollen, Bee venom, Nsaids, and Tape    Review of Systems  Respiratory:  Positive for shortness of breath.   Cardiovascular:  Positive for chest pain.  All other systems reviewed and are negative.   Updated Vital Signs BP 117/88 (BP Location: Right Arm)   Pulse 82   Temp 98.5 F (36.9 C) (Oral)   Resp 18   Wt 84.8 kg   LMP  (LMP Unknown)   SpO2 100%   BMI 30.18 kg/m   Physical Exam Vitals and nursing note reviewed.  Constitutional:      Appearance: She is well-developed.  HENT:     Head: Normocephalic and atraumatic.     Mouth/Throat:     Mouth: Mucous membranes are moist.     Pharynx: Oropharynx is clear.  Eyes:     Extraocular Movements: Extraocular  movements intact.     Pupils: Pupils are equal, round, and reactive to light.  Cardiovascular:     Rate and Rhythm: Regular rhythm.  Pulmonary:     Effort: Pulmonary effort is normal.     Breath sounds: Normal breath sounds.  Abdominal:     General: Bowel sounds are normal.     Palpations: Abdomen is soft.  Musculoskeletal:        General: Normal range of motion.     Cervical back: Normal range of motion and neck supple.     Left lower leg: Tenderness present.  Skin:    General: Skin is warm.     Capillary Refill: Capillary refill takes less than 2 seconds.  Neurological:     General: No focal deficit present.     Mental Status: She is alert and oriented to person, place, and time.  Psychiatric:        Mood and Affect: Mood normal.        Behavior: Behavior normal.     (all labs ordered are listed, but only abnormal results are displayed) Labs Reviewed  BASIC METABOLIC PANEL WITH GFR - Abnormal; Notable for the following components:      Result Value   Calcium  10.4 (*)    All other components within normal limits  CBC  URINALYSIS, ROUTINE W REFLEX MICROSCOPIC  PREGNANCY, URINE  TROPONIN T, HIGH SENSITIVITY  TROPONIN T, HIGH SENSITIVITY    EKG: EKG Interpretation Date/Time:  Thursday August 22 2024 11:47:13 EDT Ventricular Rate:  75 PR Interval:  150 QRS Duration:  74 QT Interval:  370 QTC Calculation: 413 R Axis:   47  Text Interpretation: Normal sinus rhythm Nonspecific ST and T wave abnormality Abnormal ECG When compared with ECG of 17-Jun-2024 10:05, No significant change was found Confirmed by Dean Clarity [phone]) on 08/22/2024 1:04:43 PM  Radiology: CT Angio Chest PE W and/or Wo Contrast Result Date: 08/22/2024 CLINICAL DATA:  Shortness of breath 2 days with palpitations and right-sided chest pain. Recent diagnosis left lower extremity DVT. EXAM: CT ANGIOGRAPHY CHEST WITH CONTRAST TECHNIQUE: Multidetector CT imaging of the chest was performed using the  standard protocol during bolus administration of intravenous contrast. Multiplanar CT image reconstructions and MIPs were obtained to evaluate the vascular anatomy. RADIATION DOSE REDUCTION: This exam was performed according to the departmental dose-optimization program which includes automated exposure control, adjustment of the mA and/or kV according to patient size and/or use of iterative reconstruction technique. CONTRAST:  OMNIPAQUE  IOHEXOL  350 MG/ML SOLN COMPARISON:  01/23/2024 FINDINGS: Cardiovascular: Heart is normal size. Thoracic aorta is normal caliber. Pulmonary arterial system is well opacified and demonstrates  no evidence of emboli. Remaining vascular structures are normal. Mediastinum/Nodes: No mediastinal or hilar adenopathy. Remaining mediastinal structures are normal. Lungs/Pleura: Lungs are adequately inflated without acute airspace process or effusion. Airways are normal. Upper Abdomen: No acute findings. Musculoskeletal: No focal abnormality. Review of the MIP images confirms the above findings. IMPRESSION: No acute cardiopulmonary disease and no evidence of pulmonary embolism. Electronically Signed   By: Toribio Agreste M.D.   On: 08/22/2024 13:51   VAS US  LOWER EXTREMITY VENOUS (DVT) Result Date: 08/21/2024  Lower Venous DVT Study Patient Name:  Ferrah Panagopoulos Scruggs-Hernandez  Date of Exam:   08/21/2024 Medical Rec #: [id number]                       Accession #:    [id number] Date of Birth: [birth year]                       Patient Gender: F Patient Age:   40 years Exam Location:  [address] Procedure:      VAS US  LOWER EXTREMITY VENOUS (DVT) Referring Phys: JACKSON OVERTURF --------------------------------------------------------------------------------  Indications: Pain.  Risk Factors: Hx of PE. Comparison Study: 01/24/24 Performing Technologist: Garnette Rockers  Examination Guidelines: A complete evaluation includes B-mode imaging, spectral Doppler, color Doppler, and power Doppler  as needed of all accessible portions of each vessel. Bilateral testing is considered an integral part of a complete examination. Limited examinations for reoccurring indications may be performed as noted. The reflux portion of the exam is performed with the patient in reverse Trendelenburg.  +-----+---------------+---------+-----------+----------+--------------+ RIGHTCompressibilityPhasicitySpontaneityPropertiesThrombus Aging +-----+---------------+---------+-----------+----------+--------------+ CFV  Full           Yes      Yes                                 +-----+---------------+---------+-----------+----------+--------------+   +--------+---------------+---------+-----------+----------------+-------------+ LEFT    CompressibilityPhasicitySpontaneityProperties      Thrombus                                                                 Aging         +--------+---------------+---------+-----------+----------------+-------------+ CFV     Full           Yes      Yes                                      +--------+---------------+---------+-----------+----------------+-------------+ SFJ     Full                                                             +--------+---------------+---------+-----------+----------------+-------------+ FV Prox Full                                                             +--------+---------------+---------+-----------+----------------+-------------+  FV Mid  Full                                                             +--------+---------------+---------+-----------+----------------+-------------+ FV      Full                                                             Distal                                                                   +--------+---------------+---------+-----------+----------------+-------------+ PFV     Full                                                              +--------+---------------+---------+-----------+----------------+-------------+ POP     Full           Yes      Yes                                      +--------+---------------+---------+-----------+----------------+-------------+ PTV     Full                    Yes                                      +--------+---------------+---------+-----------+----------------+-------------+ PERO    None                    No         brightly        Acute                                                    echogenic                     +--------+---------------+---------+-----------+----------------+-------------+ Acute occlusive DVT seen in 1/2 peroneal veins from proximal through mid calf.   Summary: RIGHT: - No evidence of common femoral vein obstruction.   LEFT: - Findings consistent with acute deep vein thrombosis involving the left peroneal veins.  - No cystic structure found in the popliteal fossa.  *See table(s) above for measurements and observations. Electronically signed by Gaile New MD on 08/21/2024 at 7:29:48 PM.    Final      Procedures   Medications Ordered in the ED  iohexol  (OMNIPAQUE ) 350 MG/ML  injection 100 mL (100 mLs Intravenous Contrast Given 08/22/24 1311)                                    Medical Decision Making Amount and/or Complexity of Data Reviewed Labs: ordered. Radiology: ordered.  Risk Prescription drug management.   This patient presents to the ED for concern of leg pain and cp, this involves an extensive number of treatment options, and is a complaint that carries with it a high risk of complications and morbidity.  The differential diagnosis includes PE, DVT, msk   Co morbidities that complicate the patient evaluation  migraines, anemia, asthma, PTSD, GERD, and PE/DVT   Additional history obtained:  Additional history obtained from epic chart review  Lab Tests:  I Ordered, and personally interpreted labs.  The pertinent  results include:  cbc nl. Bmp nl, trop nl   Imaging Studies ordered:  I ordered imaging studies including cta chest  I independently visualized and interpreted imaging which showed  No acute cardiopulmonary disease and no evidence of pulmonary  embolism.   I agree with the radiologist interpretation   Cardiac Monitoring:  The patient was maintained on a cardiac monitor.  I personally viewed and interpreted the cardiac monitored which showed an underlying rhythm of: nsr   Medicines ordered and prescription drug management:   I have reviewed the patients home medicines and have made adjustments as needed   Test Considered:  ct   Critical Interventions:  ct   Problem List / ED Course:  DVT left peroneal vein:  pt started on eliquis  10 mg bid for a week, then 5 mg bid.  Pt is stable for d/c.  Return if worse.  F/u with hematology.   Reevaluation:  After the interventions noted above, I reevaluated the patient and found that they have :improved   Social Determinants of Health:  Lives at home   Dispostion:  After consideration of the diagnostic results and the patients response to treatment, I feel that the patent would benefit from discharge with outpatient f/u.       Final diagnoses:  Acute deep vein thrombosis (DVT) of left peroneal vein [facility])    ED Discharge Orders          Ordered    APIXABAN  (ELIQUIS ) VTE STARTER PACK (10MG  AND 5MG )       Note to Pharmacy: If starter pack unavailable, substitute with seventy-four 5 mg apixaban  tabs following the above SIG directions.   08/22/24 1446               Dean Clarity, MD 08/22/24 [phone]

## 2024-08-22 NOTE — ED Notes (Signed)
 Pt given discharge instructions and reviewed prescriptions. Opportunities given for questions. Pt verbalizes understanding. PIV removed x1. Bethena Powell SAUNDERS, RN

## 2024-08-23 ENCOUNTER — Ambulatory Visit (HOSPITAL_BASED_OUTPATIENT_CLINIC_OR_DEPARTMENT_OTHER): Admitting: Physical Therapy

## 2024-08-23 ENCOUNTER — Telehealth (HOSPITAL_BASED_OUTPATIENT_CLINIC_OR_DEPARTMENT_OTHER): Payer: Self-pay | Admitting: Physical Therapy

## 2024-08-23 ENCOUNTER — Encounter (HOSPITAL_BASED_OUTPATIENT_CLINIC_OR_DEPARTMENT_OTHER): Payer: Self-pay

## 2024-08-23 NOTE — Telephone Encounter (Signed)
 No-show for today's appt- per chart, she did test positive for DVT/negative for PE. Called and left message on voicemail regarding next appt.  Josette Rough, PT, DPT 08/23/24 8:19 AM

## 2024-08-27 ENCOUNTER — Other Ambulatory Visit (HOSPITAL_BASED_OUTPATIENT_CLINIC_OR_DEPARTMENT_OTHER): Payer: Self-pay

## 2024-08-27 ENCOUNTER — Inpatient Hospital Stay (HOSPITAL_BASED_OUTPATIENT_CLINIC_OR_DEPARTMENT_OTHER): Admitting: Hematology and Oncology

## 2024-08-27 ENCOUNTER — Other Ambulatory Visit: Payer: Self-pay | Admitting: Hematology and Oncology

## 2024-08-27 ENCOUNTER — Inpatient Hospital Stay: Attending: Hematology and Oncology

## 2024-08-27 ENCOUNTER — Other Ambulatory Visit (HOSPITAL_COMMUNITY): Payer: Self-pay

## 2024-08-27 VITALS — BP 118/72 | HR 71 | Temp 97.2°F | Resp 13 | Wt 186.5 lb

## 2024-08-27 DIAGNOSIS — Z87891 Personal history of nicotine dependence: Secondary | ICD-10-CM | POA: Diagnosis not present

## 2024-08-27 DIAGNOSIS — M7120 Synovial cyst of popliteal space [Baker], unspecified knee: Secondary | ICD-10-CM | POA: Insufficient documentation

## 2024-08-27 DIAGNOSIS — Z79899 Other long term (current) drug therapy: Secondary | ICD-10-CM | POA: Diagnosis not present

## 2024-08-27 DIAGNOSIS — Z7901 Long term (current) use of anticoagulants: Secondary | ICD-10-CM | POA: Insufficient documentation

## 2024-08-27 DIAGNOSIS — Z803 Family history of malignant neoplasm of breast: Secondary | ICD-10-CM | POA: Insufficient documentation

## 2024-08-27 DIAGNOSIS — Z800 Family history of malignant neoplasm of digestive organs: Secondary | ICD-10-CM | POA: Insufficient documentation

## 2024-08-27 DIAGNOSIS — Z86711 Personal history of pulmonary embolism: Secondary | ICD-10-CM | POA: Diagnosis not present

## 2024-08-27 DIAGNOSIS — Z862 Personal history of diseases of the blood and blood-forming organs and certain disorders involving the immune mechanism: Secondary | ICD-10-CM | POA: Insufficient documentation

## 2024-08-27 DIAGNOSIS — Z801 Family history of malignant neoplasm of trachea, bronchus and lung: Secondary | ICD-10-CM | POA: Diagnosis not present

## 2024-08-27 DIAGNOSIS — D500 Iron deficiency anemia secondary to blood loss (chronic): Secondary | ICD-10-CM | POA: Diagnosis present

## 2024-08-27 DIAGNOSIS — Z808 Family history of malignant neoplasm of other organs or systems: Secondary | ICD-10-CM | POA: Insufficient documentation

## 2024-08-27 LAB — CMP (CANCER CENTER ONLY)
ALT: 14 U/L (ref 0–44)
AST: 13 U/L — ABNORMAL LOW (ref 15–41)
Albumin: 4.1 g/dL (ref 3.5–5.0)
Alkaline Phosphatase: 36 U/L — ABNORMAL LOW (ref 38–126)
Anion gap: 5 (ref 5–15)
BUN: 6 mg/dL (ref 6–20)
CO2: 30 mmol/L (ref 22–32)
Calcium: 9.6 mg/dL (ref 8.9–10.3)
Chloride: 104 mmol/L (ref 98–111)
Creatinine: 0.85 mg/dL (ref 0.44–1.00)
GFR, Estimated: >60 mL/min (ref >60)
Glucose, Bld: 98 mg/dL (ref 70–99)
Potassium: 3.9 mmol/L (ref 3.5–5.1)
Sodium: 139 mmol/L (ref 135–145)
Total Bilirubin: 0.5 mg/dL (ref 0.0–1.2)
Total Protein: 6.9 g/dL (ref 6.5–8.1)

## 2024-08-27 LAB — CBC WITH DIFFERENTIAL (CANCER CENTER ONLY)
Abs Immature Granulocytes: 0.03 K/uL (ref 0.00–0.07)
Basophils Absolute: 0 K/uL (ref 0.0–0.1)
Basophils Relative: 0 %
Eosinophils Absolute: 0.1 K/uL (ref 0.0–0.5)
Eosinophils Relative: 1 %
HCT: 37.7 % (ref 36.0–46.0)
Hemoglobin: 13 g/dL (ref 12.0–15.0)
Immature Granulocytes: 0 %
Lymphocytes Relative: 26 %
Lymphs Abs: 2.1 K/uL (ref 0.7–4.0)
MCH: 30.7 pg (ref 26.0–34.0)
MCHC: 34.5 g/dL (ref 30.0–36.0)
MCV: 88.9 fL (ref 80.0–100.0)
Monocytes Absolute: 0.6 K/uL (ref 0.1–1.0)
Monocytes Relative: 7 %
Neutro Abs: 5.3 K/uL (ref 1.7–7.7)
Neutrophils Relative %: 66 %
Platelet Count: 258 K/uL (ref 150–400)
RBC: 4.24 MIL/uL (ref 3.87–5.11)
RDW: 12.9 % (ref 11.5–15.5)
WBC Count: 8.1 K/uL (ref 4.0–10.5)
nRBC: 0 % (ref 0.0–0.2)

## 2024-08-27 LAB — IRON AND IRON BINDING CAPACITY (CC-WL,HP ONLY)
Iron: 95 ug/dL (ref 28–170)
Saturation Ratios: 31 % (ref 10.4–31.8)
TIBC: 307 ug/dL (ref 250–450)
UIBC: 212 ug/dL (ref 148–442)

## 2024-08-27 LAB — LACTATE DEHYDROGENASE: LDH: 140 U/L (ref 98–192)

## 2024-08-27 LAB — RETIC PANEL
Immature Retic Fract: 8.3 % (ref 2.3–15.9)
RBC.: 4.18 MIL/uL (ref 3.87–5.11)
Retic Count, Absolute: 48.5 K/uL (ref 19.0–186.0)
Retic Ct Pct: 1.2 % (ref 0.4–3.1)
Reticulocyte Hemoglobin: 36 pg (ref >27.9)

## 2024-08-27 LAB — FERRITIN: Ferritin: 115 ng/mL (ref 11–307)

## 2024-08-27 MED ORDER — RIVAROXABAN (XARELTO) VTE STARTER PACK (15 & 20 MG)
ORAL_TABLET | ORAL | 0 refills | Status: DC
  Filled 2024-08-27: qty 51, 30d supply, fill #0
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
## 2024-08-27 NOTE — Progress Notes (Signed)
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 reconstructions and MIPs were obtained to evaluate the vascular anatomy. RADIATION DOSE REDUCTION: This exam was performed according to the departmental dose-optimization program which includes automated exposure control, adjustment of the mA and/or kV according to patient size and/or use of iterative reconstruction technique. CONTRAST:  OMNIPAQUE  IOHEXOL  350 MG/ML SOLN  COMPARISON:  01/23/2024 FINDINGS: Cardiovascular: Heart is normal size. Thoracic aorta is normal caliber. Pulmonary arterial system is well opacified and demonstrates no evidence of emboli. Remaining vascular structures are normal. Mediastinum/Nodes: No mediastinal or hilar adenopathy. Remaining mediastinal structures are normal. Lungs/Pleura: Lungs are adequately inflated without acute airspace process or effusion. Airways are normal. Upper Abdomen: No acute findings. Musculoskeletal: No focal abnormality. Review of the MIP images confirms the above findings. IMPRESSION: No acute cardiopulmonary disease and no evidence of pulmonary embolism. Electronically Signed   By: Toribio Agreste M.D.   On: 08/22/2024 13:51   VAS US  LOWER EXTREMITY VENOUS (DVT) Result Date: 08/21/2024  Lower Venous DVT Study Patient Name:  Meghan Reed  Date of Exam:   08/21/2024 Medical Rec #: [id number]                       Accession #:    [id number] Date of Birth: 09-07-1986                       Patient Gender: F Patient Age:   32 years Exam Location:  [address] Procedure:      VAS US  LOWER EXTREMITY VENOUS (DVT) Referring Phys: JACKSON OVERTURF --------------------------------------------------------------------------------  Indications: Pain.  Risk Factors: Hx of PE. Comparison Study: 01/24/24 Performing Technologist: Garnette Rockers  Examination Guidelines: A complete evaluation includes B-mode imaging, spectral Doppler, color Doppler, and power Doppler as needed of all accessible portions of each vessel. Bilateral testing is considered an integral part of a complete examination. Limited examinations for reoccurring indications may be performed as noted. The reflux portion of the exam is performed with the patient in reverse Trendelenburg.  +-----+---------------+---------+-----------+----------+--------------+ RIGHTCompressibilityPhasicitySpontaneityPropertiesThrombus Aging  +-----+---------------+---------+-----------+----------+--------------+ CFV  Full           Yes      Yes                                 +-----+---------------+---------+-----------+----------+--------------+   +--------+---------------+---------+-----------+----------------+-------------+ LEFT    CompressibilityPhasicitySpontaneityProperties      Thrombus  Aging         +--------+---------------+---------+-----------+----------------+-------------+ CFV     Full           Yes      Yes                                      +--------+---------------+---------+-----------+----------------+-------------+ SFJ     Full                                                             +--------+---------------+---------+-----------+----------------+-------------+ FV Prox Full                                                             +--------+---------------+---------+-----------+----------------+-------------+ FV Mid  Full                                                             +--------+---------------+---------+-----------+----------------+-------------+ FV      Full                                                             Distal                                                                   +--------+---------------+---------+-----------+----------------+-------------+ PFV     Full                                                             +--------+---------------+---------+-----------+----------------+-------------+ POP     Full           Yes      Yes                                      +--------+---------------+---------+-----------+----------------+-------------+ PTV     Full                    Yes                                      +--------+---------------+---------+-----------+----------------+-------------+ PERO    None  No          brightly        Acute                                                    echogenic                     +--------+---------------+---------+-----------+----------------+-------------+ Acute occlusive DVT seen in 1/2 peroneal veins from proximal through mid calf.   Summary: RIGHT: - No evidence of common femoral vein obstruction.   LEFT: - Findings consistent with acute deep vein thrombosis involving the left peroneal veins.  - No cystic structure found in the popliteal fossa.  *See table(s) above for measurements and observations. Electronically signed by Gaile New MD on 08/21/2024 at 7:29:48 PM.    Final     ASSESSMENT & PLAN Meghan Reed 38 y.o. 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  She has been taking her iron prescription as prescribed and does not currently have any symptoms of recurrent VTE.     #Pulmonary Embolism in Setting of Pregnancy # Post Operative Pulmonary Embolism  --patient continued lovenox  until 12/12/2019 (6 weeks post partum). Completed full course of anticoagulation -- Patient had a tubal ligation, no anticipated pregnancies in the future.   -- Patient underwent shoulder surgery on 01/22/2024, subsequently developed PE on 01/23/2024. --Recommend 3 to 6 months of anticoagulation therapy.  Plan to see the patient back in 3 months to determine if she requires to her 49-month course. -- Patient notes that she is interested in indefinite anticoagulation given the fact that she is prone to clotting at such young age.  Both blood clots were technically provoked, however if anticoagulation would be reasonable because she does have a proclivity for bleeding. PLAN:   --labs today show white blood cell 8.1, hemoglobin 13.0, MCV 88.9, platelets 258 --Patient would like a once daily medication.  Will stop Eliquis  therapy and transition to Xarelto.  Will start with loading dose of Xarelto 15 mg twice daily x 21 days followed by 20 mg p.o. daily. --Would recommend indefinite anticoagulation moving forward.  Would not reduce to maintenance dose again. --RTC in 3 months.   #Thrombocytopenia s/p Post Partum Hemorrhage, resolved.  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  All questions were answered. The patient knows to call the clinic with any problems, questions or concerns.  A total of more than 30 minutes were spent on this encounter and over half of that time was spent on counseling and coordination of care as outlined above.   Norleen IVAR Kidney, MD Department of Hematology/Oncology [facility] Phone: [phone] Pager: [phone] Email: [email or web address]  08/27/2024 10:27 AM

## 2024-08-28 ENCOUNTER — Encounter (HOSPITAL_BASED_OUTPATIENT_CLINIC_OR_DEPARTMENT_OTHER): Payer: Self-pay | Admitting: Physical Therapy

## 2024-08-28 ENCOUNTER — Ambulatory Visit (HOSPITAL_BASED_OUTPATIENT_CLINIC_OR_DEPARTMENT_OTHER): Admitting: Physical Therapy

## 2024-08-28 DIAGNOSIS — M25551 Pain in right hip: Secondary | ICD-10-CM

## 2024-08-28 DIAGNOSIS — R262 Difficulty in walking, not elsewhere classified: Secondary | ICD-10-CM

## 2024-08-28 DIAGNOSIS — M25651 Stiffness of right hip, not elsewhere classified: Secondary | ICD-10-CM

## 2024-08-28 DIAGNOSIS — M6281 Muscle weakness (generalized): Secondary | ICD-10-CM

## 2024-08-28 NOTE — Progress Notes (Signed)
 09/02/24 ALL: Alisea returns for Botox . Migraines are well managed. She continues Emgality  injections monthly and Nurtec (gets samples from her work). She was recently diagnosed with 4th blood clot, DVT. She was s/p right hip arthroscopy. Eliquis  had been decreased to 2.5mg  BID. Now on Xarelto. We have discussed Botox , Emgality  and Nurtec. There is no data related to an increased risk of blood clots on any of these medications. She will continue close follow up with hematology.   06/10/2024 ALL: Azalie returns for Botox . She continues Emgality  monthly. We switched Ubrelvy  to Nurtec recently due to intractable migraines. She has used samples that were very effective. Rx has not been approved yet. She reports less migraines since weather has cooled off.   03/11/2024 ALL: Palmira presents for second Botox . She reports initial improvement in headache days after last procedure but then had shoulder surgery resulting complicated by pulmonary embolism. Now on lifelong anticoagulation. She tolerated procedure well. Ubrelvy  and Advil  help with abortive therapy.   12/05/2023 ALL: Lyndzie presents for first Botox  procedure. She continues Emgality  and Ubrelvy . She averages at least 10-15 migraine days a month. She gets some relief with Ubrelvy  and Advil .     Consent Form Botulism Toxin Injection For Chronic Migraine    Reviewed orally with patient, additionally signature is on file:  Botulism toxin has been approved by the Federal drug administration for treatment of chronic migraine. Botulism toxin does not cure chronic migraine and it may not be effective in some patients.  The administration of botulism toxin is accomplished by injecting a small amount of toxin into the muscles of the neck and head. Dosage must be titrated for each individual. Any benefits resulting from botulism toxin tend to wear off after 3 months with a repeat injection required if benefit is to be maintained. Injections are usually done  every 3-4 months with maximum effect peak achieved by about 2 or 3 weeks. Botulism toxin is expensive and you should be sure of what costs you will incur resulting from the injection.  The side effects of botulism toxin use for chronic migraine may include:   -Transient, and usually mild, facial weakness with facial injections  -Transient, and usually mild, head or neck weakness with head/neck injections  -Reduction or loss of forehead facial animation due to forehead muscle weakness  -Eyelid drooping  -Dry eye  -Pain at the site of injection or bruising at the site of injection  -Double vision  -Potential unknown long term risks   Contraindications: You should not have Botox  if you are pregnant, nursing, allergic to albumin, have an infection, skin condition, or muscle weakness at the site of the injection, or have myasthenia gravis, Lambert-Eaton syndrome, or ALS.  It is also possible that as with any injection, there may be an allergic reaction or no effect from the medication. Reduced effectiveness after repeated injections is sometimes seen and rarely infection at the injection site may occur. All care will be taken to prevent these side effects. If therapy is given over a long time, atrophy and wasting in the muscle injected may occur. Occasionally the patient's become refractory to treatment because they develop antibodies to the toxin. In this event, therapy needs to be modified.  I have read the above information and consent to the administration of botulism toxin.    BOTOX  PROCEDURE NOTE FOR MIGRAINE HEADACHE  Contraindications and precautions discussed with patient(above). Aseptic procedure was observed and patient tolerated procedure. Procedure performed by Greig Forbes, FNP-C.  The condition has existed for more than 6 months, and pt does not have a diagnosis of ALS, Myasthenia Gravis or Lambert-Eaton Syndrome.  Risks and benefits of injections discussed and pt agrees to proceed  with the procedure.  Written consent obtained  These injections are medically necessary. Pt  receives good benefits from these injections. These injections do not cause sedations or hallucinations which the oral therapies may cause.   Description of procedure:  The patient was placed in a sitting position. The standard protocol was used for Botox  as follows, with 5 units of Botox  injected at each site:  -Procerus muscle, midline injection  -Corrugator muscle, bilateral injection  -Frontalis muscle, bilateral injection, with 2 sites each side, medial injection was performed in the upper one third of the frontalis muscle, in the region vertical from the medial inferior edge of the superior orbital rim. The lateral injection was again in the upper one third of the forehead vertically above the lateral limbus of the cornea, 1.5 cm lateral to the medial injection site.  -Temporalis muscle injection, 4 sites, bilaterally. The first injection was 3 cm above the tragus of the ear, second injection site was 1.5 cm to 3 cm up from the first injection site in line with the tragus of the ear. The third injection site was 1.5-3 cm forward between the first 2 injection sites. The fourth injection site was 1.5 cm posterior to the second injection site. 5th site laterally in the temporalis  muscleat the level of the outer canthus.  -Occipitalis muscle injection, 3 sites, bilaterally. The first injection was done one half way between the occipital protuberance and the tip of the mastoid process behind the ear. The second injection site was done lateral and superior to the first, 1 fingerbreadth from the first injection. The third injection site was 1 fingerbreadth superiorly and medially from the first injection site.  -Cervical paraspinal muscle injection, 2 sites, bilaterally. The first injection site was 1 cm from the midline of the cervical spine, 3 cm inferior to the lower border of the occipital  protuberance. The second injection site was 1.5 cm superiorly and laterally to the first injection site.  -Trapezius muscle injection was performed at 3 sites, bilaterally. The first injection site was in the upper trapezius muscle halfway between the inflection point of the neck, and the acromion. The second injection site was one half way between the acromion and the first injection site. The third injection was done between the first injection site and the inflection point of the neck.   Will return for repeat injection in 3 months.   A total of 200 units of Botox  was prepared, 155 units of Botox  was injected as documented above, any Botox  not injected was wasted. The patient tolerated the procedure well, there were no complications of the above procedure.

## 2024-08-30 ENCOUNTER — Encounter (HOSPITAL_BASED_OUTPATIENT_CLINIC_OR_DEPARTMENT_OTHER): Payer: Self-pay

## 2024-08-30 ENCOUNTER — Ambulatory Visit (HOSPITAL_BASED_OUTPATIENT_CLINIC_OR_DEPARTMENT_OTHER): Admitting: Physical Therapy

## 2024-09-02 ENCOUNTER — Ambulatory Visit (INDEPENDENT_AMBULATORY_CARE_PROVIDER_SITE_OTHER): Admitting: Family Medicine

## 2024-09-02 ENCOUNTER — Encounter: Payer: Self-pay | Admitting: Radiology

## 2024-09-02 ENCOUNTER — Ambulatory Visit: Admitting: Internal Medicine

## 2024-09-02 ENCOUNTER — Encounter: Payer: Self-pay | Admitting: Internal Medicine

## 2024-09-02 ENCOUNTER — Encounter: Payer: Self-pay | Admitting: Family Medicine

## 2024-09-02 VITALS — BP 115/75

## 2024-09-02 DIAGNOSIS — G43709 Chronic migraine without aura, not intractable, without status migrainosus: Secondary | ICD-10-CM | POA: Diagnosis not present

## 2024-09-02 MED ORDER — ONABOTULINUMTOXINA 200 UNITS IJ SOLR
155.0000 [IU] | Freq: Once | INTRAMUSCULAR | Status: AC
  Administered 2024-09-02: 155 [IU] via INTRAMUSCULAR

## 2024-09-02 NOTE — Progress Notes (Signed)
 Botox - 200 units x 1 vial Lot: [id number] Expiration: 2027/12 NDC: 0023-3921-02  Bacteriostatic 0.9% Sodium Chloride - 4 mL  Lot: [id number] Expiration: 08/30/25 NDC: 9590803397  Dx:  H56.290     B/B Witnessed by DOUGLASS PARAS CMA

## 2024-09-03 ENCOUNTER — Ambulatory Visit (HOSPITAL_BASED_OUTPATIENT_CLINIC_OR_DEPARTMENT_OTHER): Admitting: Physical Therapy

## 2024-09-05 ENCOUNTER — Encounter (HOSPITAL_BASED_OUTPATIENT_CLINIC_OR_DEPARTMENT_OTHER): Payer: Self-pay | Admitting: Physical Therapy

## 2024-09-05 ENCOUNTER — Ambulatory Visit (HOSPITAL_BASED_OUTPATIENT_CLINIC_OR_DEPARTMENT_OTHER): Attending: Orthopaedic Surgery | Admitting: Physical Therapy

## 2024-09-05 DIAGNOSIS — M25551 Pain in right hip: Secondary | ICD-10-CM | POA: Insufficient documentation

## 2024-09-05 DIAGNOSIS — M25651 Stiffness of right hip, not elsewhere classified: Secondary | ICD-10-CM | POA: Insufficient documentation

## 2024-09-05 DIAGNOSIS — R262 Difficulty in walking, not elsewhere classified: Secondary | ICD-10-CM | POA: Insufficient documentation

## 2024-09-05 DIAGNOSIS — M6281 Muscle weakness (generalized): Secondary | ICD-10-CM | POA: Diagnosis present
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
## 2024-09-07 ENCOUNTER — Other Ambulatory Visit (HOSPITAL_COMMUNITY): Payer: Self-pay

## 2024-09-11 ENCOUNTER — Encounter (HOSPITAL_BASED_OUTPATIENT_CLINIC_OR_DEPARTMENT_OTHER): Payer: Self-pay | Admitting: Physical Therapy

## 2024-09-11 ENCOUNTER — Encounter (HOSPITAL_BASED_OUTPATIENT_CLINIC_OR_DEPARTMENT_OTHER): Admitting: Orthopaedic Surgery

## 2024-09-11 ENCOUNTER — Encounter (HOSPITAL_BASED_OUTPATIENT_CLINIC_OR_DEPARTMENT_OTHER): Payer: Self-pay

## 2024-09-17 ENCOUNTER — Ambulatory Visit: Payer: Self-pay | Admitting: Internal Medicine

## 2024-09-17 ENCOUNTER — Ambulatory Visit (INDEPENDENT_AMBULATORY_CARE_PROVIDER_SITE_OTHER): Admitting: Internal Medicine

## 2024-09-17 ENCOUNTER — Other Ambulatory Visit (HOSPITAL_COMMUNITY): Payer: Self-pay

## 2024-09-17 ENCOUNTER — Ambulatory Visit (INDEPENDENT_AMBULATORY_CARE_PROVIDER_SITE_OTHER)

## 2024-09-17 ENCOUNTER — Encounter: Payer: Self-pay | Admitting: Internal Medicine

## 2024-09-17 DIAGNOSIS — M79604 Pain in right leg: Secondary | ICD-10-CM | POA: Diagnosis not present

## 2024-09-17 DIAGNOSIS — M25551 Pain in right hip: Secondary | ICD-10-CM | POA: Diagnosis not present

## 2024-09-17 LAB — D-DIMER, QUANTITATIVE: D-Dimer, Quant: 0.29 ug{FEU}/mL (ref <0.50)

## 2024-09-17 MED ORDER — HYDROCODONE-ACETAMINOPHEN 7.5-325 MG PO TABS
1.0000 | ORAL_TABLET | Freq: Four times a day (QID) | ORAL | 0 refills | Status: DC | PRN
  Filled 2024-09-17: qty 20, 5d supply, fill #0

## 2024-09-17 NOTE — Patient Instructions (Addendum)
 Go to ER if worse Elevate right leg

## 2024-09-17 NOTE — Progress Notes (Signed)
 Subjective:  Patient ID: Meghan Reed, female    DOB: September 04, 1986  Age: 38 y.o. MRN: [id number]  CC: Leg Pain (Left leg pain)   HPI Meghan Reed presents for severe R leg pain that woke her up from sleep at 4:00 in the morning.  She had charley horses in the calf.  Later the pain spread up the leg.  It was 8 out of 10 intensity.  Sharvi went to work this morning.  The pain moved to her right hip and was quite severe.  Tylenol /Advil  combo that she took did not help.  Currently the pain is located in the right hip primarily.  The pain intensity is 5 out of 10 at rest and it gets substantially worse with walking.  She has been limping.  She continues to have dull pain in the whole right lower extremity.  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  She is in the process of getting prior approval for Xarelto to start 20 mg daily when approved.  She has a history of PE.  She is status post right total hip replacement surgery.  Outpatient Medications Prior to Visit  Medication Sig Dispense Refill   albuterol  (PROVENTIL ) (2.5 MG/3ML) 0.083% nebulizer solution Take 3 mLs (2.5 mg total) by nebulization every 6 (six) hours as needed for wheezing or shortness of breath. 180 mL 1   albuterol  (VENTOLIN  HFA) 108 (90 Base) MCG/ACT inhaler Inhale 2 puffs into the lungs every 4 (four) hours as needed for wheezing or shortness of breath. 18 g 6   azelastine  (ASTELIN ) 0.1 % nasal spray Place 1 spray into both nostrils 2 (two) times daily. Use in each nostril as directed 30 mL 12   budesonide -formoterol  (SYMBICORT ) 160-4.5 MCG/ACT inhaler Inhale 2 puffs into the lungs 2 (two) times daily. 10.2 g 2   cetirizine  (ZYRTEC ) 10 MG tablet Take 1 tablet (10 mg total) by mouth daily. 90 tablet 3   cyclobenzaprine  (FLEXERIL ) 10 MG tablet Take 1 tablet (10 mg total) by mouth every 8 (eight) hours as needed for muscle spasms. 60 tablet 0    ferrous sulfate  (FEROSUL) 325 (65 FE) MG tablet Take 1 tablet (325 mg total) by mouth daily with breakfast. Please take with a source of Vitamin C 90 tablet 3   fluconazole  (DIFLUCAN ) 100 MG tablet Day 1: Take 2 tablets daily. Day 2-14: Take 1 tablet daily. 15 tablet 0   Galcanezumab -gnlm (EMGALITY ) 120 MG/ML SOAJ Inject 120 mg into the skin every 30 days. 3 mL 3   omeprazole  (PRILOSEC ) 20 MG capsule Take 1 capsule (20 mg total) by mouth daily. 90 capsule 2   ondansetron  (ZOFRAN -ODT) 4 MG disintegrating tablet Take 1 tablet (4 mg total) by mouth every 8 (eight) hours as needed for nausea or vomiting. 12 tablet 0   oxyCODONE  (ROXICODONE ) 5 MG immediate release tablet Take 1 tablet (5 mg total) by mouth every 4 (four) hours as needed for severe pain (pain score 7-10) or breakthrough pain. 10 tablet 0   Rimegepant Sulfate  (NURTEC) 75 MG TBDP Dissolve 1 tablet (75 mg total) by mouth daily as needed (take for abortive therapy of migraine, no more than 1 tablet in 24 hours or 10 per month). 8 tablet 11   RIVAROXABAN (XARELTO) VTE STARTER PACK (15 & 20 MG) Follow package directions: Take one 15mg  tablet by mouth twice a day. On day 22, switch to one 20mg   tablet once a day. Take with food. 51 each 0   No facility-administered medications prior to visit.    ROS: Review of Systems  Constitutional:  Positive for fatigue. Negative for activity change, appetite change, chills, fever and unexpected weight change.  HENT:  Negative for congestion, mouth sores and sinus pressure.   Eyes:  Negative for visual disturbance.  Respiratory:  Negative for cough and chest tightness.   Cardiovascular:  Negative for chest pain and leg swelling.  Gastrointestinal:  Negative for abdominal pain, blood in stool and nausea.  Genitourinary:  Negative for difficulty urinating, frequency, pelvic pain and vaginal pain.  Musculoskeletal:  Positive for gait problem. Negative for back pain and joint swelling.  Skin:  Negative for  pallor and rash.  Neurological:  Negative for dizziness, tremors, weakness, numbness and headaches.  Psychiatric/Behavioral:  Positive for decreased concentration. Negative for confusion, dysphoric mood and sleep disturbance. The patient is not nervous/anxious.     Objective:  LMP  (LMP Unknown)   BP Readings from Last 3 Encounters:  09/02/24 115/75  09/02/24 108/76  08/27/24 118/72    Wt Readings from Last 3 Encounters:  09/02/24 185 lb (83.9 kg)  08/27/24 186 lb 8 oz (84.6 kg)  08/22/24 187 lb (84.8 kg)    Physical Exam Constitutional:      General: She is not in acute distress.    Appearance: She is well-developed. She is not toxic-appearing.  HENT:     Head: Normocephalic.     Right Ear: External ear normal.     Left Ear: External ear normal.     Nose: Nose normal.  Eyes:     General:        Right eye: No discharge.        Left eye: No discharge.     Conjunctiva/sclera: Conjunctivae normal.     Pupils: Pupils are equal, round, and reactive to light.  Neck:     Thyroid: No thyromegaly.     Vascular: No JVD.     Trachea: No tracheal deviation.  Cardiovascular:     Rate and Rhythm: Normal rate and regular rhythm.     Heart sounds: Normal heart sounds.  Pulmonary:     Effort: No respiratory distress.     Breath sounds: No stridor. No wheezing.  Abdominal:     General: Bowel sounds are normal. There is no distension.     Palpations: Abdomen is soft. There is no mass.     Tenderness: There is no abdominal tenderness. There is no guarding or rebound.  Musculoskeletal:        General: Tenderness present.     Cervical back: Normal range of motion and neck supple. No rigidity.     Right lower leg: No edema.     Left lower leg: No edema.  Lymphadenopathy:     Cervical: No cervical adenopathy.  Skin:    Findings: No erythema or rash.  Neurological:     Mental Status: She is oriented to person, place, and time.     Cranial Nerves: No cranial nerve deficit.      Motor: No abnormal muscle tone.     Coordination: Coordination normal.     Gait: Gait abnormal.     Deep Tendon Reflexes: Reflexes normal.  Psychiatric:        Behavior: Behavior normal.        Thought Content: Thought content normal.        Judgment: Judgment normal.    She  is in no acute distress.  She is uncomfortable with pain in the right leg.  Not dyspneic she is limping right leg without obvious swelling calf is tender to palpation.  Foot tilting is painful.  Right hip but with mild tenderness on deep palpation; right groin is tender to palpation.  Hip joint is painful to range of motion, external and internal rotation.  No heat.  No lateral tenderness No rash, no excessive warmth   Lab Results  Component Value Date   WBC 8.1 08/27/2024   HGB 13.0 08/27/2024   HCT 37.7 08/27/2024   PLT 258 08/27/2024   GLUCOSE 98 08/27/2024   CHOL 231 (H) 03/06/2024   TRIG 99 03/06/2024   HDL 65 03/06/2024   LDLCALC 149 (H) 03/06/2024   ALT 14 08/27/2024   AST 13 (L) 08/27/2024   NA 139 08/27/2024   K 3.9 08/27/2024   CL 104 08/27/2024   CREATININE 0.85 08/27/2024   BUN 6 08/27/2024   CO2 30 08/27/2024   TSH 0.824 03/19/2024   INR 1.5 (H) 10/31/2019   HGBA1C 4.7 (L) 01/25/2024    CT Angio Chest PE W and/or Wo Contrast Result Date: 08/22/2024 CLINICAL DATA:  Shortness of breath 2 days with palpitations and right-sided chest pain. Recent diagnosis left lower extremity DVT. EXAM: CT ANGIOGRAPHY CHEST WITH CONTRAST TECHNIQUE: Multidetector CT imaging of the chest was performed using the standard protocol during bolus administration of intravenous contrast. Multiplanar CT image reconstructions and MIPs were obtained to evaluate the vascular anatomy. RADIATION DOSE REDUCTION: This exam was performed according to the departmental dose-optimization program which includes automated exposure control, adjustment of the mA and/or kV according to patient size and/or use of iterative reconstruction  technique. CONTRAST:  OMNIPAQUE  IOHEXOL  350 MG/ML SOLN COMPARISON:  01/23/2024 FINDINGS: Cardiovascular: Heart is normal size. Thoracic aorta is normal caliber. Pulmonary arterial system is well opacified and demonstrates no evidence of emboli. Remaining vascular structures are normal. Mediastinum/Nodes: No mediastinal or hilar adenopathy. Remaining mediastinal structures are normal. Lungs/Pleura: Lungs are adequately inflated without acute airspace process or effusion. Airways are normal. Upper Abdomen: No acute findings. Musculoskeletal: No focal abnormality. Review of the MIP images confirms the above findings. IMPRESSION: No acute cardiopulmonary disease and no evidence of pulmonary embolism. Electronically Signed   By: Toribio Agreste M.D.   On: 08/22/2024 13:51    Assessment & Plan:   Problem List Items Addressed This Visit     Leg pain, anterior, right - Primary   Gianna presents with a new severe R leg pain that woke her up from sleep at 4:00 in the morning.  She had charley horses in the calf.  Later the pain spread up the leg.  It was 8 out of 10 intensity.  Orena went to work this morning.  The pain moved to her right hip and was quite severe.  Tylenol /Advil  combo that she took did not help.  Currently the pain is located in the right hip primarily.  The pain intensity is 5 out of 10 at rest and it gets substantially worse with walking.  She has been limping.  She continues to have dull pain in the whole right lower extremity.  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  She is in the process of getting prior approval for Xarelto to start 20 mg daily when approved.  She has a history of PE.  She is status  post right total hip replacement surgery.  She is in no acute distress.  She is uncomfortable with pain in the right leg.  Not dyspneic she is limping right leg without obvious swelling calf is tender to palpation.  Foot tilting is  painful.  Right hip but with mild tenderness on deep palpation; right groin is tender to palpation.  Hip joint is painful to range of motion, external and internal rotation.  No heat.  No lateral tenderness   I would be primarily concerned of right leg DVT.  Obtain stat D-dimer.  Obtain venous Doppler ultrasound tomorrow.  She will continue with Eliquis  10 mg twice a day.  Elevate leg She is seeing Dr. Federico for her regarding DVT/PE problem.  Another concern would be a problem with the right hip joint.  Obtain x-ray stat.  Obtain CRP, CBC, sed rate Norco 7.5/325 prescribed for pain as needed.  Elevate leg  Shingles would be another consideration-less likely      Relevant Orders   Comprehensive metabolic panel with GFR   CBC with Differential/Platelet   D-dimer, quantitative   Sedimentation rate   C-reactive protein   DG HIP UNILAT WITH PELVIS 2-3 VIEWS RIGHT   VAS US  LOWER EXTREMITY VENOUS (DVT)      Meds ordered this encounter  Medications   HYDROcodone -acetaminophen  (NORCO) 7.5-325 MG tablet    Sig: Take 1 tablet by mouth every 6 (six) hours as needed for moderate pain (pain score 4-6).    Dispense:  20 tablet    Refill:  0      Follow-up: Return for f/u with PCP.  Marolyn Noel, MD

## 2024-09-17 NOTE — Assessment & Plan Note (Signed)
 Meghan Reed presents with a new severe R leg pain that woke her up from sleep at 4:00 in the morning.  She had charley horses in the calf.  Later the pain spread up the leg.  It was 8 out of 10 intensity.  [name] went to work this morning.  The pain moved to her right hip and was quite severe.  Tylenol /Advil  combo that she took did not help.  Currently the pain is located in the right hip primarily.  The pain intensity is 5 out of 10 at rest and it gets substantially worse with walking.  She has been limping.  She continues to have dull pain in the whole right lower extremity.  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  She is in the process of getting prior approval for Xarelto to start 20 mg daily when approved.  She has a history of PE.  She is status post right total hip replacement surgery.  She is in no acute distress.  She is uncomfortable with pain in the right leg.  Not dyspneic she is limping right leg without obvious swelling calf is tender to palpation.  Foot tilting is painful.  Right hip but with mild tenderness on deep palpation; right groin is tender to palpation.  Hip joint is painful to range of motion, external and internal rotation.  No heat.  No lateral tenderness   I would be primarily concerned of right leg DVT.  Obtain stat D-dimer.  Obtain venous Doppler ultrasound tomorrow.  She will continue with Eliquis  10 mg twice a day.  Elevate leg She is seeing Dr. Federico for her regarding DVT/PE problem.  Another concern would be a problem with the right hip joint.  Obtain x-ray stat.  Obtain CRP, CBC, sed rate Norco 7.5/325 prescribed for pain as needed.  Elevate leg  Shingles would be another consideration-less likely

## 2024-09-18 ENCOUNTER — Ambulatory Visit (HOSPITAL_COMMUNITY)
Admission: RE | Admit: 2024-09-18 | Discharge: 2024-09-18 | Disposition: A | Discharge status: Home / Self Care | Source: Ambulatory Visit | Attending: Internal Medicine | Admitting: Internal Medicine

## 2024-09-18 DIAGNOSIS — M79604 Pain in right leg: Secondary | ICD-10-CM | POA: Insufficient documentation

## 2024-09-18 LAB — CBC WITH DIFFERENTIAL/PLATELET
Basophils Absolute: 0.1 K/uL (ref 0.0–0.1)
Basophils Relative: 1.2 % (ref 0.0–3.0)
Eosinophils Absolute: 0.1 K/uL (ref 0.0–0.7)
Eosinophils Relative: 1.4 % (ref 0.0–5.0)
HCT: 37.3 % (ref 36.0–46.0)
Hemoglobin: 12.9 g/dL (ref 12.0–15.0)
Lymphocytes Relative: 23 % (ref 12.0–46.0)
Lymphs Abs: 1.9 K/uL (ref 0.7–4.0)
MCHC: 34.6 g/dL (ref 30.0–36.0)
MCV: 92 fl (ref 78.0–100.0)
Monocytes Absolute: 0.6 K/uL (ref 0.1–1.0)
Monocytes Relative: 7.4 % (ref 3.0–12.0)
Neutro Abs: 5.4 K/uL (ref 1.4–7.7)
Neutrophils Relative %: 67 % (ref 43.0–77.0)
Platelets: 233 K/uL (ref 150.0–400.0)
RBC: 4.05 Mil/uL (ref 3.87–5.11)
RDW: 14.1 % (ref 11.5–15.5)
WBC: 8.1 K/uL (ref 4.0–10.5)

## 2024-09-18 LAB — COMPREHENSIVE METABOLIC PANEL WITH GFR
ALT: 15 U/L (ref 0–35)
AST: 14 U/L (ref 0–37)
Albumin: 4.1 g/dL (ref 3.5–5.2)
Alkaline Phosphatase: 35 U/L — ABNORMAL LOW (ref 39–117)
BUN: 11 mg/dL (ref 6–23)
CO2: 29 meq/L (ref 19–32)
Calcium: 8.8 mg/dL (ref 8.4–10.5)
Chloride: 102 meq/L (ref 96–112)
Creatinine, Ser: 0.73 mg/dL (ref 0.40–1.20)
GFR: 103.61 mL/min (ref >60.00)
Glucose, Bld: 79 mg/dL (ref 70–99)
Potassium: 3.7 meq/L (ref 3.5–5.1)
Sodium: 138 meq/L (ref 135–145)
Total Bilirubin: 0.4 mg/dL (ref 0.2–1.2)
Total Protein: 6.6 g/dL (ref 6.0–8.3)

## 2024-09-18 LAB — SEDIMENTATION RATE: Sed Rate: 5 mm/h (ref 0–20)

## 2024-09-18 LAB — C-REACTIVE PROTEIN: CRP: <0.5 mg/dL (ref 0.5–20.0)

## 2024-09-19 ENCOUNTER — Encounter (HOSPITAL_BASED_OUTPATIENT_CLINIC_OR_DEPARTMENT_OTHER): Payer: Self-pay | Admitting: Physical Therapy

## 2024-09-19 ENCOUNTER — Ambulatory Visit (INDEPENDENT_AMBULATORY_CARE_PROVIDER_SITE_OTHER): Admitting: Orthopaedic Surgery

## 2024-09-19 ENCOUNTER — Ambulatory Visit (HOSPITAL_BASED_OUTPATIENT_CLINIC_OR_DEPARTMENT_OTHER): Payer: Self-pay | Admitting: Physical Therapy

## 2024-09-19 DIAGNOSIS — M25551 Pain in right hip: Secondary | ICD-10-CM | POA: Diagnosis not present

## 2024-09-19 DIAGNOSIS — R262 Difficulty in walking, not elsewhere classified: Secondary | ICD-10-CM

## 2024-09-19 DIAGNOSIS — M6281 Muscle weakness (generalized): Secondary | ICD-10-CM

## 2024-09-19 DIAGNOSIS — M25651 Stiffness of right hip, not elsewhere classified: Secondary | ICD-10-CM
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
## 2024-09-24 ENCOUNTER — Encounter (HOSPITAL_BASED_OUTPATIENT_CLINIC_OR_DEPARTMENT_OTHER)

## 2024-10-02 ENCOUNTER — Ambulatory Visit: Admitting: Sports Medicine

## 2024-10-02 ENCOUNTER — Ambulatory Visit: Payer: Self-pay

## 2024-10-02 ENCOUNTER — Encounter (HOSPITAL_BASED_OUTPATIENT_CLINIC_OR_DEPARTMENT_OTHER): Payer: Self-pay | Admitting: Physical Therapy

## 2024-10-02 ENCOUNTER — Ambulatory Visit (HOSPITAL_BASED_OUTPATIENT_CLINIC_OR_DEPARTMENT_OTHER): Attending: Orthopaedic Surgery | Admitting: Physical Therapy

## 2024-10-02 ENCOUNTER — Encounter: Payer: Self-pay | Admitting: Sports Medicine

## 2024-10-02 DIAGNOSIS — M5442 Lumbago with sciatica, left side: Secondary | ICD-10-CM

## 2024-10-02 DIAGNOSIS — M25551 Pain in right hip: Secondary | ICD-10-CM | POA: Insufficient documentation

## 2024-10-02 DIAGNOSIS — M5441 Lumbago with sciatica, right side: Secondary | ICD-10-CM | POA: Diagnosis not present

## 2024-10-02 DIAGNOSIS — G8929 Other chronic pain: Secondary | ICD-10-CM

## 2024-10-02 DIAGNOSIS — M25651 Stiffness of right hip, not elsewhere classified: Secondary | ICD-10-CM | POA: Diagnosis present

## 2024-10-02 DIAGNOSIS — R29898 Other symptoms and signs involving the musculoskeletal system: Secondary | ICD-10-CM

## 2024-10-02 DIAGNOSIS — M6281 Muscle weakness (generalized): Secondary | ICD-10-CM | POA: Insufficient documentation

## 2024-10-02 DIAGNOSIS — E559 Vitamin D deficiency, unspecified: Secondary | ICD-10-CM

## 2024-10-02 DIAGNOSIS — R262 Difficulty in walking, not elsewhere classified: Secondary | ICD-10-CM | POA: Insufficient documentation

## 2024-10-02 DIAGNOSIS — M5459 Other low back pain: Secondary | ICD-10-CM | POA: Insufficient documentation

## 2024-10-02 DIAGNOSIS — R2681 Unsteadiness on feet: Secondary | ICD-10-CM | POA: Diagnosis present

## 2024-10-02 DIAGNOSIS — Z8639 Personal history of other endocrine, nutritional and metabolic disease: Secondary | ICD-10-CM
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
## 2024-10-02 NOTE — Progress Notes (Signed)
 Patient says that she has had heaviness and pain in her legs for years. She does have a history of low back pain, and has been evaluated and treated for various disc pathologies. She is still in physical therapy, and says that she has had injections for her back in the past, as well. Patient says that her symptoms are worse at night, and she is unable to sleep as she has to get out of bed to get relief in her legs. She does have constant aching, like a toothache, although will occasionally get sharp and shooting pains down the back of the legs. She also mentions having numbness in the buttocks when seated; that numbness does occasionally go down the entire leg to the foot. She says that she will trip often, even when there is nothing there. She feels as though she is regressing in physical therapy.
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
## 2024-10-04 ENCOUNTER — Other Ambulatory Visit (HOSPITAL_COMMUNITY): Payer: Self-pay

## 2024-10-04 ENCOUNTER — Encounter (HOSPITAL_BASED_OUTPATIENT_CLINIC_OR_DEPARTMENT_OTHER): Payer: Self-pay

## 2024-10-04 ENCOUNTER — Ambulatory Visit (HOSPITAL_BASED_OUTPATIENT_CLINIC_OR_DEPARTMENT_OTHER)

## 2024-10-04 ENCOUNTER — Other Ambulatory Visit: Payer: Self-pay | Admitting: Sports Medicine

## 2024-10-04 ENCOUNTER — Ambulatory Visit: Payer: Self-pay | Admitting: Sports Medicine

## 2024-10-04 DIAGNOSIS — M25551 Pain in right hip: Secondary | ICD-10-CM

## 2024-10-04 DIAGNOSIS — M25651 Stiffness of right hip, not elsewhere classified: Secondary | ICD-10-CM

## 2024-10-04 DIAGNOSIS — R262 Difficulty in walking, not elsewhere classified: Secondary | ICD-10-CM

## 2024-10-04 DIAGNOSIS — E559 Vitamin D deficiency, unspecified: Secondary | ICD-10-CM

## 2024-10-04 DIAGNOSIS — M6281 Muscle weakness (generalized): Secondary | ICD-10-CM

## 2024-10-04 LAB — FERRITIN: Ferritin: 61 ng/mL (ref 16–154)

## 2024-10-04 LAB — VITAMIN B6

## 2024-10-04 LAB — EXTRA LAV TOP TUBE

## 2024-10-04 LAB — VITAMIN B12: Vitamin B-12: 230 pg/mL (ref 200–1100)

## 2024-10-04 LAB — VITAMIN D 25 HYDROXY (VIT D DEFICIENCY, FRACTURES): Vit D, 25-Hydroxy: 15 ng/mL — ABNORMAL LOW (ref 30–100)

## 2024-10-04 MED ORDER — VITAMIN D (ERGOCALCIFEROL) 1.25 MG (50000 UNIT) PO CAPS
50000.0000 [IU] | ORAL_CAPSULE | ORAL | 0 refills | Status: AC
  Filled 2024-10-04: qty 8, 56d supply, fill #0
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
## 2024-10-07 ENCOUNTER — Ambulatory Visit (HOSPITAL_BASED_OUTPATIENT_CLINIC_OR_DEPARTMENT_OTHER)

## 2024-10-07 ENCOUNTER — Encounter (HOSPITAL_BASED_OUTPATIENT_CLINIC_OR_DEPARTMENT_OTHER): Payer: Self-pay

## 2024-10-07 DIAGNOSIS — M25551 Pain in right hip: Secondary | ICD-10-CM | POA: Diagnosis not present

## 2024-10-07 DIAGNOSIS — M25651 Stiffness of right hip, not elsewhere classified: Secondary | ICD-10-CM

## 2024-10-07 DIAGNOSIS — R262 Difficulty in walking, not elsewhere classified: Secondary | ICD-10-CM

## 2024-10-07 DIAGNOSIS — M6281 Muscle weakness (generalized): Secondary | ICD-10-CM
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
## 2024-10-09 ENCOUNTER — Ambulatory Visit (INDEPENDENT_AMBULATORY_CARE_PROVIDER_SITE_OTHER): Admitting: Internal Medicine

## 2024-10-09 ENCOUNTER — Other Ambulatory Visit (HOSPITAL_COMMUNITY)
Admission: RE | Admit: 2024-10-09 | Discharge: 2024-10-09 | Disposition: A | Discharge status: Home / Self Care | Source: Ambulatory Visit | Attending: Internal Medicine | Admitting: Internal Medicine

## 2024-10-09 ENCOUNTER — Encounter: Payer: Self-pay | Admitting: Internal Medicine

## 2024-10-09 VITALS — HR 82 | Ht 66.0 in

## 2024-10-09 DIAGNOSIS — Z202 Contact with and (suspected) exposure to infections with a predominantly sexual mode of transmission: Secondary | ICD-10-CM | POA: Diagnosis present

## 2024-10-09 DIAGNOSIS — E049 Nontoxic goiter, unspecified: Secondary | ICD-10-CM | POA: Diagnosis not present

## 2024-10-09 DIAGNOSIS — A6000 Herpesviral infection of urogenital system, unspecified: Secondary | ICD-10-CM | POA: Diagnosis not present

## 2024-10-09 NOTE — Patient Instructions (Signed)
 Please continue all other medications as before, and refills have been done if requested.  Please have the pharmacy call with any other refills you may need.  Please continue your efforts at being more active, low cholesterol diet, and weight control.  Please keep your appointments with your specialists as you may have planned  You will be contacted regarding the referral for: Thyroid ultrasound  Please go to the LAB at the blood drawing area for the tests to be done  You will be contacted by phone if any changes need to be made immediately.  Otherwise, you will receive a letter about your results with an explanation, but please check with MyChart first.
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
## 2024-10-10 ENCOUNTER — Ambulatory Visit: Payer: Self-pay | Admitting: Internal Medicine

## 2024-10-10 ENCOUNTER — Other Ambulatory Visit: Payer: Self-pay | Admitting: Internal Medicine

## 2024-10-10 ENCOUNTER — Encounter: Payer: Self-pay | Admitting: Internal Medicine

## 2024-10-10 ENCOUNTER — Encounter: Payer: Self-pay | Admitting: Hematology and Oncology

## 2024-10-10 ENCOUNTER — Ambulatory Visit (HOSPITAL_BASED_OUTPATIENT_CLINIC_OR_DEPARTMENT_OTHER)

## 2024-10-10 ENCOUNTER — Other Ambulatory Visit: Payer: Self-pay | Admitting: Family Medicine

## 2024-10-10 ENCOUNTER — Other Ambulatory Visit (HOSPITAL_COMMUNITY): Payer: Self-pay

## 2024-10-10 DIAGNOSIS — E049 Nontoxic goiter, unspecified: Secondary | ICD-10-CM | POA: Insufficient documentation

## 2024-10-10 DIAGNOSIS — A6000 Herpesviral infection of urogenital system, unspecified: Secondary | ICD-10-CM | POA: Insufficient documentation

## 2024-10-10 DIAGNOSIS — Z202 Contact with and (suspected) exposure to infections with a predominantly sexual mode of transmission: Secondary | ICD-10-CM | POA: Insufficient documentation

## 2024-10-10 LAB — HIV ANTIBODY (ROUTINE TESTING W REFLEX)
HIV 1&2 Ab, 4th Generation: NONREACTIVE
HIV FINAL INTERPRETATION: NEGATIVE

## 2024-10-10 LAB — CERVICOVAGINAL ANCILLARY ONLY
Bacterial Vaginitis (gardnerella): POSITIVE — AB
Candida Glabrata: NEGATIVE
Candida Vaginitis: POSITIVE — AB
Chlamydia: NEGATIVE
Comment: NEGATIVE
Comment: NEGATIVE
Comment: NEGATIVE
Comment: NEGATIVE
Comment: NEGATIVE
Comment: NORMAL
Neisseria Gonorrhea: NEGATIVE
Trichomonas: NEGATIVE

## 2024-10-10 LAB — THYROID PANEL WITH TSH
Free Thyroxine Index: 2.5 (ref 1.4–3.8)
T3 Uptake: 30 % (ref 22–35)
T4, Total: 8.4 ug/dL (ref 5.1–11.9)
TSH: 0.38 m[IU]/L — ABNORMAL LOW

## 2024-10-10 LAB — SYPHILIS: RPR W/REFLEX TO RPR TITER AND TREPONEMAL ANTIBODIES, TRADITIONAL SCREENING AND DIAGNOSIS ALGORITHM: RPR Ser Ql: NONREACTIVE

## 2024-10-10 MED ORDER — FLUCONAZOLE 100 MG PO TABS
ORAL_TABLET | ORAL | 0 refills | Status: DC
  Filled 2024-10-10: qty 15, 14d supply, fill #0

## 2024-10-10 MED ORDER — NYSTATIN 100000 UNIT/ML MT SUSP
5.0000 mL | Freq: Four times a day (QID) | OROMUCOSAL | 0 refills | Status: DC
  Filled 2024-10-10: qty 180, 9d supply, fill #0

## 2024-10-10 MED ORDER — METRONIDAZOLE 250 MG PO TABS
250.0000 mg | ORAL_TABLET | Freq: Three times a day (TID) | ORAL | 0 refills | Status: AC
  Filled 2024-10-10: qty 21, 7d supply, fill #0

## 2024-10-10 NOTE — Assessment & Plan Note (Addendum)
 Mild, for thyroid ultrasound and thyroid panel

## 2024-10-10 NOTE — Assessment & Plan Note (Signed)
 No recent outbreak, continue to follow

## 2024-10-10 NOTE — Assessment & Plan Note (Signed)
 Pt with recent possible exposure, now for STD testing - HIV, syphilis, and vaginal self swab; has hx of genital herpes and would not need testing today

## 2024-10-11 ENCOUNTER — Encounter (HOSPITAL_BASED_OUTPATIENT_CLINIC_OR_DEPARTMENT_OTHER): Payer: Self-pay

## 2024-10-11 ENCOUNTER — Ambulatory Visit (HOSPITAL_BASED_OUTPATIENT_CLINIC_OR_DEPARTMENT_OTHER)

## 2024-10-15 ENCOUNTER — Ambulatory Visit (HOSPITAL_BASED_OUTPATIENT_CLINIC_OR_DEPARTMENT_OTHER)

## 2024-10-15 ENCOUNTER — Telehealth (HOSPITAL_BASED_OUTPATIENT_CLINIC_OR_DEPARTMENT_OTHER): Payer: Self-pay

## 2024-10-15 NOTE — Telephone Encounter (Signed)
 Called and spoke wit pt regarding missed appt. Pt stated she got stuck at work and forgot to call. Pt has visit scheduled for Friday.

## 2024-10-16 ENCOUNTER — Ambulatory Visit: Admitting: Internal Medicine

## 2024-10-17 ENCOUNTER — Inpatient Hospital Stay: Admission: RE | Admit: 2024-10-17 | Discharge: 2024-10-17 | Attending: Internal Medicine | Admitting: Internal Medicine

## 2024-10-17 DIAGNOSIS — E049 Nontoxic goiter, unspecified: Secondary | ICD-10-CM

## 2024-10-18 ENCOUNTER — Ambulatory Visit (HOSPITAL_BASED_OUTPATIENT_CLINIC_OR_DEPARTMENT_OTHER)

## 2024-10-22 ENCOUNTER — Encounter (HOSPITAL_BASED_OUTPATIENT_CLINIC_OR_DEPARTMENT_OTHER)

## 2024-10-22 ENCOUNTER — Emergency Department (HOSPITAL_COMMUNITY)
Admission: EM | Admit: 2024-10-22 | Discharge: 2024-10-22 | Disposition: A | Discharge status: Home / Self Care | Attending: Emergency Medicine | Admitting: Emergency Medicine

## 2024-10-22 ENCOUNTER — Encounter (HOSPITAL_BASED_OUTPATIENT_CLINIC_OR_DEPARTMENT_OTHER): Payer: Self-pay

## 2024-10-22 ENCOUNTER — Ambulatory Visit: Payer: Self-pay | Admitting: Family Medicine

## 2024-10-22 ENCOUNTER — Other Ambulatory Visit

## 2024-10-22 ENCOUNTER — Emergency Department (HOSPITAL_COMMUNITY)

## 2024-10-22 ENCOUNTER — Other Ambulatory Visit: Payer: Self-pay | Admitting: Family Medicine

## 2024-10-22 ENCOUNTER — Encounter: Payer: Self-pay | Admitting: Family Medicine

## 2024-10-22 DIAGNOSIS — R0602 Shortness of breath: Secondary | ICD-10-CM

## 2024-10-22 DIAGNOSIS — Z7901 Long term (current) use of anticoagulants: Secondary | ICD-10-CM | POA: Diagnosis not present

## 2024-10-22 DIAGNOSIS — Z86711 Personal history of pulmonary embolism: Secondary | ICD-10-CM | POA: Diagnosis not present

## 2024-10-22 DIAGNOSIS — R42 Dizziness and giddiness: Secondary | ICD-10-CM | POA: Diagnosis not present

## 2024-10-22 DIAGNOSIS — R072 Precordial pain: Secondary | ICD-10-CM | POA: Insufficient documentation

## 2024-10-22 DIAGNOSIS — I82403 Acute embolism and thrombosis of unspecified deep veins of lower extremity, bilateral: Secondary | ICD-10-CM | POA: Diagnosis not present

## 2024-10-22 DIAGNOSIS — R0789 Other chest pain: Secondary | ICD-10-CM

## 2024-10-22 DIAGNOSIS — R079 Chest pain, unspecified: Secondary | ICD-10-CM

## 2024-10-22 DIAGNOSIS — R110 Nausea: Secondary | ICD-10-CM | POA: Diagnosis not present

## 2024-10-22 LAB — BASIC METABOLIC PANEL WITH GFR
Anion gap: 6 (ref 5–15)
BUN: 9 mg/dL (ref 6–20)
CO2: 28 mmol/L (ref 22–32)
Calcium: 8.5 mg/dL — ABNORMAL LOW (ref 8.9–10.3)
Chloride: 105 mmol/L (ref 98–111)
Creatinine, Ser: 0.7 mg/dL (ref 0.44–1.00)
GFR, Estimated: >60 mL/min
Glucose, Bld: 89 mg/dL (ref 70–99)
Potassium: 3.5 mmol/L (ref 3.5–5.1)
Sodium: 138 mmol/L (ref 135–145)

## 2024-10-22 LAB — CBC WITH DIFFERENTIAL/PLATELET
Abs Immature Granulocytes: 0.02 K/uL (ref 0.00–0.07)
Basophils Absolute: 0.1 K/uL (ref 0.0–0.1)
Basophils Relative: 1 %
Eosinophils Absolute: 0.1 K/uL (ref 0.0–0.5)
Eosinophils Relative: 1 %
HCT: 37.1 % (ref 36.0–46.0)
Hemoglobin: 12.6 g/dL (ref 12.0–15.0)
Immature Granulocytes: 0 %
Lymphocytes Relative: 25 %
Lymphs Abs: 2 K/uL (ref 0.7–4.0)
MCH: 32 pg (ref 26.0–34.0)
MCHC: 34 g/dL (ref 30.0–36.0)
MCV: 94.2 fL (ref 80.0–100.0)
Monocytes Absolute: 0.5 K/uL (ref 0.1–1.0)
Monocytes Relative: 7 %
Neutro Abs: 5.4 K/uL (ref 1.7–7.7)
Neutrophils Relative %: 66 %
Platelets: 219 K/uL (ref 150–400)
RBC: 3.94 MIL/uL (ref 3.87–5.11)
RDW: 12.5 % (ref 11.5–15.5)
WBC: 8.1 K/uL (ref 4.0–10.5)
nRBC: 0 % (ref 0.0–0.2)

## 2024-10-22 LAB — TROPONIN I (HIGH SENSITIVITY): High Sens Troponin I: 3 ng/L (ref 2–17)

## 2024-10-22 LAB — TROPONIN T, HIGH SENSITIVITY
Troponin T High Sensitivity: <15 ng/L (ref 0–19)
Troponin T High Sensitivity: <15 ng/L (ref 0–19)

## 2024-10-22 LAB — D-DIMER, QUANTITATIVE: D-Dimer, Quant: 0.71 ug{FEU}/mL — ABNORMAL HIGH

## 2024-10-22 MED ORDER — IOHEXOL 350 MG/ML SOLN
75.0000 mL | Freq: Once | INTRAVENOUS | Status: AC | PRN
  Administered 2024-10-22: 75 mL via INTRAVENOUS

## 2024-10-22 MED ORDER — ONDANSETRON HCL 4 MG/2ML IJ SOLN
4.0000 mg | Freq: Once | INTRAMUSCULAR | Status: AC
  Administered 2024-10-22: 4 mg via INTRAVENOUS
  Filled 2024-10-22: qty 2

## 2024-10-22 NOTE — ED Triage Notes (Signed)
 Pt bibems from work, [employer]. Complained of sudden onset of chest pain, nausea, and dizziness. Hx of PE. Zofran  4mg  given   BP 118/74 HR 75 O2 99% RA

## 2024-10-22 NOTE — ED Provider Notes (Signed)
 " [place] [facility] Provider Note   CSN: [id number] Arrival date & time: 10/22/24  1541     History Chief Complaint  Patient presents with   Chest Pain    HPI: Meghan Reed is a 38 y.o. 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 Patient arrived via EMS from work.  History provided by patient.  No interpreter required during this encounter.  Patient reports that she has had history of multiple prior PEs and DVTs.  Reports that her most recent PE was in spring 2025, most recent DVT was in fall 2025.  Reports that she takes Eliquis  twice daily, did have 1 missed dose 2 days ago.  Reports that that she was at baseline until she was at work today when she developed a substernal chest pressure that radiated to her left breast, and felt like a sensation under her left breast.  Reports that this is similar to her sensation of prior pulmonary embolisms.  Reports that she also had associated shortness of breath, nausea, dizziness.  Reports that she called EMS, and after being initiated on oxygen and administered Zofran  her dizziness and nausea resolved.  EMS reports that patient was vitally stable and route, was placed on oxygen for comfort.  No additional interventions and route outside of oxygen and Zofran .  Patient's recorded medical, surgical, social, medication list and allergies were reviewed in the Snapshot window as part of the initial history.   Prior to Admission medications  Medication Sig Start Date End Date Taking? Authorizing Provider  albuterol  (PROVENTIL ) (2.5 MG/3ML) 0.083% nebulizer solution Take 3 mLs (2.5 mg total) by nebulization every 6 (six) hours as needed for wheezing or shortness of breath. 01/18/24   Elnor Lauraine BRAVO, NP  albuterol  (VENTOLIN  HFA) 108 (90 Base) MCG/ACT inhaler Inhale 2  puffs into the lungs every 4 (four) hours as needed for wheezing or shortness of breath. 01/18/24   Elnor Lauraine BRAVO, NP  azelastine  (ASTELIN ) 0.1 % nasal spray Place 1 spray into both nostrils 2 (two) times daily. Use in each nostril as directed 06/05/24   Meade Verdon RAMAN, MD  budesonide -formoterol  (SYMBICORT ) 160-4.5 MCG/ACT inhaler Inhale 2 puffs into the lungs 2 (two) times daily. 01/18/24   Elnor Lauraine BRAVO, NP  cetirizine  (ZYRTEC ) 10 MG tablet Take 1 tablet (10 mg total) by mouth daily. 06/05/24   Meade Verdon RAMAN, MD  cyclobenzaprine  (FLEXERIL ) 10 MG tablet Take 1 tablet (10 mg total) by mouth every 8 (eight) hours as needed for muscle spasms. 07/04/23   Marlee Lynwood NOVAK, MD  ferrous sulfate  (FEROSUL) 325 (65 FE) MG tablet Take 1 tablet (325 mg total) by mouth daily with breakfast. Please take with a source of Vitamin C 06/17/24   Dorsey, John T IV, MD  fluconazole  (DIFLUCAN ) 100 MG tablet Day 1: Take 2 tablets. Day 2-14: Take 1 tablet daily. 10/10/24   Norleen Lynwood ORN, MD  Galcanezumab -gnlm (EMGALITY ) 120 MG/ML SOAJ Inject 120 mg into the skin every 30 days. 05/23/23   Lomax, Amy, NP  HYDROcodone -acetaminophen  (NORCO) 7.5-325 MG tablet Take 1 tablet by mouth every 6 (six) hours as needed for moderate pain (pain score 4-6). 09/17/24   Plotnikov, Aleksei V, MD  magic mouthwash (nystatin , lidocaine , diphenhydrAMINE , alum & mag hydroxide) suspension Swish and swallow 5 mLs 4 (four) times daily. 10/10/24   Alvia Corean CROME, FNP  omeprazole  (  PRILOSEC ) 20 MG capsule Take 1 capsule (20 mg total) by mouth daily. 06/17/24   West, Katlyn D, NP  ondansetron  (ZOFRAN -ODT) 4 MG disintegrating tablet Take 1 tablet (4 mg total) by mouth every 8 (eight) hours as needed for nausea or vomiting. 07/27/24   Yolande Lamar BROCKS, MD  oxyCODONE  (ROXICODONE ) 5 MG immediate release tablet Take 1 tablet (5 mg total) by mouth every 4 (four) hours as needed for severe pain (pain score 7-10) or breakthrough pain. 05/22/24   Genelle Standing, MD   Rimegepant Sulfate  (NURTEC) 75 MG TBDP Dissolve 1 tablet (75 mg total) by mouth daily as needed (take for abortive therapy of migraine, no more than 1 tablet in 24 hours or 10 per month). 05/23/24   Lomax, Amy, NP  RIVAROXABAN  (XARELTO ) VTE STARTER PACK (15 & 20 MG) Follow package directions: Take one 15mg  tablet by mouth twice a day. On day 22, switch to one 20mg  tablet once a day. Take with food. 08/27/24   Federico Norleen ONEIDA MADISON, MD  Vitamin D , Ergocalciferol , (DRISDOL ) 1.25 MG (50000 UNIT) CAPS capsule Take 1 capsule (50,000 Units total) by mouth every 7 (seven) days. 10/04/24   [name], DO     Allergies: Tomato, Aspirin , Bee pollen, Bee venom, Nsaids, and Tape   Review of Systems   ROS as per HPI  Physical Exam Updated Vital Signs BP 114/79 (BP Location: Right Arm)   Pulse 66   Temp 98.6 F (37 C) (Oral)   Resp 20   Ht 5' 6 (1.676 m)   Wt 83.9 kg   LMP  (LMP Unknown)   SpO2 100%   BMI 29.86 kg/m  Physical Exam Vitals and nursing note reviewed.  Constitutional:      General: She is not in acute distress.    Appearance: She is well-developed.  HENT:     Head: Normocephalic and atraumatic.  Eyes:     Conjunctiva/sclera: Conjunctivae normal.  Cardiovascular:     Rate and Rhythm: Normal rate and regular rhythm.     Pulses:          Radial pulses are 2+ on the right side and 2+ on the left side.     Heart sounds: No murmur heard. Pulmonary:     Effort: Pulmonary effort is normal. No respiratory distress.     Breath sounds: Normal breath sounds.  Abdominal:     Palpations: Abdomen is soft.     Tenderness: There is no abdominal tenderness.  Musculoskeletal:        General: No swelling.     Cervical back: Neck supple.     Right lower leg: No tenderness. No edema.     Left lower leg: No tenderness. No edema.  Skin:    General: Skin is warm and dry.     Capillary Refill: Capillary refill takes less than 2 seconds.  Neurological:     Mental Status: She is alert.   Psychiatric:        Mood and Affect: Mood normal.     ED Course/ Medical Decision Making/ A&P    Procedures Procedures   Medications Ordered in ED Medications  ondansetron  (ZOFRAN ) injection 4 mg (4 mg Intravenous Given 10/22/24 1745)  iohexol  (OMNIPAQUE ) 350 MG/ML injection 75 mL (75 mLs Intravenous Contrast Given 10/22/24 1844)    Medical Decision Making:   Hadassah Clapper Scruggs-Hernandez is a 38 y.o. female who presents for multiple symptoms as per above.  Physical exam is pertinent for no focal abnormalities.  The differential includes but is not limited to ACS, arrhythmia, pericardial tamponade, pericarditis, myocarditis, pneumonia, pneumothorax, esophageal, tear, perforated abdominal viscous, pulmonary embolism, aortic dissection, costochondritis, musculoskeletal chest wall pain, GERD.  Independent historian: EMS  External data reviewed: Labs: reviewed prior labs for baseline and EKG  Labs: Ordered, Independent interpretation, and Details: CBC without leukocytosis, anemia, thrombocytopenia. BMP without AKI, emergent electrolyte derangement.  Initial troponin undetectable.  Delta undetectable.  hCG not indicated given patient is status post hysterectomy.  Radiology: Ordered, Independent interpretation, Details: Personally reviewed CT PE study, do not appreciate large central PE, focal airspace opacification, mediastinal fluid derangement, or other focal derangement, and All images reviewed independently.  Agree with radiology report at this time.   CT Angio Chest PE W and/or Wo Contrast Result Date: 10/22/2024 CLINICAL DATA:  Pulmonary embolism (PE) suspected, high prob Sudden onset of chest pain.  History of pulmonary embolus. EXAM: CT ANGIOGRAPHY CHEST WITH CONTRAST TECHNIQUE: Multidetector CT imaging of the chest was performed using the standard protocol during bolus administration of intravenous contrast. Multiplanar CT image reconstructions and MIPs were obtained to  evaluate the vascular anatomy. RADIATION DOSE REDUCTION: This exam was performed according to the departmental dose-optimization program which includes automated exposure control, adjustment of the mA and/or kV according to patient size and/or use of iterative reconstruction technique. CONTRAST:  75mL OMNIPAQUE  IOHEXOL  350 MG/ML SOLN COMPARISON:  Chest CT 08/22/2024 FINDINGS: Cardiovascular: There are no filling defects within the pulmonary arteries to suggest pulmonary embolus. The thoracic aorta is normal in caliber. There is no contrast in the aorta. The heart is normal in size. No pericardial effusion. Mediastinum/Nodes: No mediastinal or hilar adenopathy. Decompressed esophagus. No visible thyroid  nodule Lungs/Pleura: Clear lungs. No focal airspace disease. No pleural effusion. No features of pulmonary edema. The trachea and central airways are clear. Upper Abdomen: No acute findings. Musculoskeletal: There are no acute or suspicious osseous abnormalities. Slight scoliotic curvature of the spine. Unremarkable chest wall soft tissues Review of the MIP images confirms the above findings. IMPRESSION: No pulmonary embolus or acute intrathoracic abnormality. Electronically Signed   By: Andrea Gasman M.D.   On: 10/22/2024 20:03   US  THYROID  Result Date: 10/18/2024 CLINICAL DATA:  New goiter on physical examination. EXAM: THYROID  ULTRASOUND TECHNIQUE: Ultrasound examination of the thyroid  gland and adjacent soft tissues was performed. COMPARISON:  None available. FINDINGS: Parenchymal Echotexture: Normal Isthmus: 0.4 cm Right lobe: 5.1 x 1.5 x 2.1 cm Left lobe: 4.8 x 1.4 x 1.7 cm _________________________________________________________ Estimated total number of nodules >/= 1 cm: 0 Number of spongiform nodules >/=  2 cm not described below (TR1): 0 Number of mixed cystic and solid nodules >/= 1.5 cm not described below (TR2): 0 _________________________________________________________ No discrete nodules are  seen within the thyroid  gland. IMPRESSION: Normal thyroid  ultrasound The above is in keeping with the ACR TI-RADS recommendations - J Am Coll Radiol 2017;14:587-595. Electronically Signed   By: Aliene Lloyd M.D.   On: 10/18/2024 08:03   XR Lumbar Spine 2-3 Views Result Date: 10/02/2024 2 views of the lumbar spine including AP and lateral femoral ordered and reviewed by myself today.  X-rays demonstrate 5 nonrib-bearing lumbar vertebra.  There is no significant arthritic change or degenerative disc disease.  There is good alignment of the spine without scoliosis.  SI joints unremarkable.  No acute osseous abnormality noted.   EKG/Medicine tests: Ordered and Independent interpretation EKG Interpretation Date/Time:  Tuesday October 22 2024 15:48:16 EST Ventricular Rate:  73 PR Interval:  158  QRS Duration:  100 QT Interval:  390 QTC Calculation: 430 R Axis:   18  Text Interpretation: Sinus rhythm  Borderline T abnormalities, anterior leads, similar to Aug 2025  No significant change since last tracing        Confirmed by Rogelia Satterfield ([id number]) on 10/22/2024 3:54:32 PM  Interventions: Zofran   See the EMR for full details regarding lab and imaging results.  Patient presents for pleuritic chest pain, reports prior history of multiple prior VTE, has had recent missed dose of Eliquis , thus patient is at high risk for repeat PE, therefore will obtain CT PE study.  This was obtained and demonstrates no acute PE or other acute abnormality.  The ECG reveals no anatomical ischemia representing STEMI, New-Onset Arrhythmia, or ischemic equivalent. She has been risk stratified with a HEAR score of 2. Initial troponin is undetectable; delta troponin is undetectable.  Aspirin  not administered given patient has reported allergy .  The patient's presentation, the patient being hemodynamically stable, and the ECG are not consistent with Pericardial Tamponade. The patient's pain is not positional. This in  conjunction with the lack of PR depressions and ST elevations on the ECG are reassuring against Pericarditis. The patient's non-elevated troponin and ECG are also inconsistent with Myocarditis.  The CT is unremarkable for focal airspace disease.  The patient is afebrile and denies productive cough.  Therefore, I do not suspect Pneumonia. There is no evidence of Pneumothorax on physical exam or on the CT. CT shows no evidence of Esophageal Tear and there is no recent intractable emesis or esophageal instrumentation. There is no peritonitis or free air on CT worrisome for a Perforated Abdominal Viscous.  The patient's pain is not tearing and it does not radiate to back. Pulses are present bilaterally in both the upper and lower extremities. CT does not show a widened mediastinum. I have a very low suspicion for Aortic Dissection.  Presentation is most consistent with acute complicated illness  Discussion of management or test interpretations with external provider(s): Not indicated  Risk Drugs:Prescription drug management  Disposition: DISCHARGE: I believe that the patient is safe for discharge home with outpatient follow-up. Patient was informed of all pertinent physical exam, laboratory, and imaging findings.  Patient's suspected etiology of their symptom presentation was discussed with the patient and all questions were answered. We discussed following up with PCP. I provided thorough ED return precautions. The patient feels safe and comfortable with this plan.  MDM generated using voice dictation software and may contain dictation errors.  Please contact me for any clarification or with any questions.  Clinical Impression:  1. Chest pain, unspecified type      Discharge   Final Clinical Impression(s) / ED Diagnoses Final diagnoses:  Chest pain, unspecified type    Rx / DC Orders ED Discharge Orders     None        Rogelia Satterfield RAMAN, MD 10/23/24 0021  "

## 2024-10-22 NOTE — Discharge Instructions (Signed)
 Meghan Reed  Thank you for allowing us  to take care of you today.  You came to the Emergency Department today because you had chest pain that was reminiscent of your prior pulmonary embolisms.  Here in the emergency department your CT does not show any blood clots, or other abnormalities.  Your heart number was normal twice, therefore your symptoms are not due to a heart attack.  We are not finding any emergency causes of your chest pain on your workup today.  We do recommend that you try to take your Eliquis  regularly, you could consider getting a pill-case attachment for your car keys or as a necklace, and keeping a spare dose of Eliquis  and that, and setting a timer on your phone, that way you are able to have a dose on you when your timer goes off, and you are less likely to accidentally miss a dose.  To-Do: 1. Please follow-up with your primary doctor within 1 - 2 weeks / as soon as possible.   Please return to the Emergency Department or call 911 if you experience have worsening of your symptoms, or do not get better, new or different chest pain, shortness of breath, severe or significantly worsening pain, high fever, severe confusion, pass out or have any reason to think that you need emergency medical care.   We hope you feel better soon.   Mitzie Later, MD Department of Emergency Medicine [facility]

## 2024-10-25 ENCOUNTER — Ambulatory Visit (HOSPITAL_BASED_OUTPATIENT_CLINIC_OR_DEPARTMENT_OTHER)

## 2024-10-25 ENCOUNTER — Encounter (HOSPITAL_BASED_OUTPATIENT_CLINIC_OR_DEPARTMENT_OTHER): Payer: Self-pay

## 2024-10-25 ENCOUNTER — Other Ambulatory Visit: Payer: Self-pay | Admitting: Family Medicine

## 2024-10-25 DIAGNOSIS — R221 Localized swelling, mass and lump, neck: Secondary | ICD-10-CM

## 2024-10-25 DIAGNOSIS — M6281 Muscle weakness (generalized): Secondary | ICD-10-CM

## 2024-10-25 DIAGNOSIS — M25551 Pain in right hip: Secondary | ICD-10-CM | POA: Diagnosis not present

## 2024-10-25 DIAGNOSIS — M25651 Stiffness of right hip, not elsewhere classified: Secondary | ICD-10-CM

## 2024-10-25 DIAGNOSIS — Z7901 Long term (current) use of anticoagulants: Secondary | ICD-10-CM

## 2024-10-25 DIAGNOSIS — Z86711 Personal history of pulmonary embolism: Secondary | ICD-10-CM

## 2024-10-25 DIAGNOSIS — R262 Difficulty in walking, not elsewhere classified: Secondary | ICD-10-CM

## 2024-10-25 NOTE — Progress Notes (Signed)
 C/o neck swelling, trouble swallowing, hx PE, on Eliquis . R/o aneurysm.
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
## 2024-10-27 ENCOUNTER — Other Ambulatory Visit: Payer: Self-pay | Admitting: Hematology and Oncology

## 2024-10-27 DIAGNOSIS — Z86711 Personal history of pulmonary embolism: Secondary | ICD-10-CM

## 2024-10-27 NOTE — Progress Notes (Unsigned)
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 400 pg/mL may experience neuropsychiatric and hematologic abnormalities due to occult B12 deficiency; less than 1% of patients with values above 400 pg/mL will have symptoms. .   Vitamin B6     Status: None   Collection Time: 10/02/24  9:11 AM  Result Value Ref Range   Vitamin B6 CANCELED     Comment: TEST NOT PERFORMED . No suitable specimen received. Please review the test requirements at  [email or web address]  Result canceled by the ancillary.   EXTRA LAV TOP TUBE     Status: None   Collection Time: 10/02/24  9:11 AM  Result  Value Ref Range   EXTRA LAVENDER-TOP TUBE      Comment: We received an extra specimen with no test requested. If any test is desired for this specimen please call client services and advise.   Thyroid  Panel With TSH     Status: Abnormal   Collection Time: 10/09/24  2:17 PM  Result Value Ref Range   T3 Uptake 30 22 - 35 %   T4, Total 8.4 5.1 - 11.9 mcg/dL   Free Thyroxine Index 2.5 1.4 - 3.8   TSH 0.38 (L) mIU/L    Comment:           Reference Range .           > or = 20 Years  0.40-4.50 .                Pregnancy Ranges           First trimester    0.26-2.66           Second trimester   0.55-2.73           Third trimester    0.43-2.91   HIV Antibody (routine testing w rflx)     Status: None   Collection Time: 10/09/24  2:17 PM  Result Value Ref Range   HIV FINAL INTERPRETATION HIV NEGATIVE     Comment: HIV-1 antigen and HIV-1/HIV-2 antibodies were not detected. There is no laboratory evidence of HIV infection.    HIV 1&2 Ab, 4th Generation NON-REACTIVE NON-REACTIVE  RPR W/RFLX TO RPR TITER, TREPONEMAL AB, SCREEN AND DIAGNOSIS     Status: None   Collection Time: 10/09/24  2:17 PM  Result Value Ref Range   RPR Ser Ql NON-REACTIVE NON-REACTIVE    Comment: . No laboratory evidence of syphilis. If recent exposure is suspected, submit a new sample in 2-4 weeks. .   Cervicovaginal ancillary only( [facility])     Status: Abnormal   Collection Time: 10/09/24  2:42 PM  Result Value Ref Range   Neisseria Gonorrhea Negative    Chlamydia Negative    Trichomonas Negative    Bacterial Vaginitis (gardnerella) Positive (A)    Candida Vaginitis Positive (A)    Candida Glabrata Negative    Comment Normal Reference Range Candida Species - Negative    Comment Normal Reference Range Candida Galbrata - Negative    Comment Normal Reference Range Trichomonas - Negative    Comment      Normal Reference Range Bacterial Vaginosis - Negative   Comment Normal Reference Ranger Chlamydia -  Negative    Comment  Normal Reference Range Neisseria Gonorrhea - Negative  D-dimer, quantitative     Status: Abnormal   Collection Time: 10/22/24 12:55 PM  Result Value Ref Range   D-Dimer, Quant 0.71 (H) <0.50 mcg/mL FEU    Comment: Elevated D-dimer levels are associated with DIC, malignancies, inflammation, sepsis, surgery, trauma, and pregnancy. A D-dimer result less than 0.5 mcg/mL  FEU, in conjunction with a non-high clinical pre-test  probability assessment model, excludes deep vein thrombosis and pulmonary embolism. However, since  D-dimer values increase with age, the [employer] recommends an age-adjusted cut-off value in patients older than 50. The calculation for an age adjusted cut-off value is age (years) x 0.01 mcg/mL  FEU. For example, the cut-off for a 39 year old patient would be 69 x 0.01 mcg/mL FEU. . For additional information, please refer to http://education.[facility].com/faq/FAQ149 (This link is being provided for  informational/educational purposes only.)   Troponin I (High Sensitivity)     Status: None   Collection Time: 10/22/24  1:01 PM  Result Value Ref Range   High Sens Troponin I 3 2 - 17 ng/L  CBC with Differential     Status: None   Collection Time: 10/22/24  4:38 PM  Result Value Ref Range   WBC 8.1 4.0 - 10.5 K/uL   RBC 3.94 3.87 - 5.11 MIL/uL   Hemoglobin 12.6 12.0 - 15.0 g/dL   HCT 62.8 63.9 - 53.9 %   MCV 94.2 80.0 - 100.0 fL   MCH 32.0 26.0 - 34.0 pg   MCHC 34.0 30.0 - 36.0 g/dL   RDW 87.4 88.4 - 84.4 %   Platelets 219 150 - 400 K/uL   nRBC 0.0 0.0 - 0.2 %   Neutrophils Relative % 66 %   Neutro Abs 5.4 1.7 - 7.7 K/uL   Lymphocytes Relative 25 %   Lymphs Abs 2.0 0.7 - 4.0 K/uL   Monocytes Relative 7 %   Monocytes Absolute 0.5 0.1 - 1.0 K/uL   Eosinophils Relative 1 %   Eosinophils Absolute 0.1 0.0 - 0.5 K/uL   Basophils Relative 1 %   Basophils Absolute 0.1 0.0 - 0.1 K/uL   Immature Granulocytes 0  %   Abs Immature Granulocytes 0.02 0.00 - 0.07 K/uL    Comment: Performed at [facility], [address], [place], [place] [address]  Basic metabolic panel     Status: Abnormal   Collection Time: 10/22/24  4:38 PM  Result Value Ref Range   Sodium 138 135 - 145 mmol/L   Potassium 3.5 3.5 - 5.1 mmol/L   Chloride 105 98 - 111 mmol/L   CO2 28 22 - 32 mmol/L   Glucose, Bld 89 70 - 99 mg/dL    Comment: Glucose reference range applies only to samples taken after fasting for at least 8 hours.   BUN 9 6 - 20 mg/dL   Creatinine, Ser 9.29 0.44 - 1.00 mg/dL   Calcium  8.5 (L) 8.9 - 10.3 mg/dL   GFR, Estimated >39 >39 mL/min    Comment: (NOTE) Calculated using the CKD-EPI Creatinine Equation (2021)    Anion gap 6 5 - 15    Comment: Performed at [facility], [address], [place], [place] [address]  Troponin T, High Sensitivity     Status: None   Collection Time: 10/22/24  4:38 PM  Result Value Ref Range   Troponin T High Sensitivity <15 0 - 19 ng/L    Comment: (NOTE) Biotin concentrations >  1000 ng/mL falsely decrease TnT results.  Serial cardiac troponin measurements are suggested.  Refer to the Links section for chest pain algorithms and additional  guidance. Performed at [facility], [address] [place], [place] [address]   Troponin T, High Sensitivity     Status: None   Collection Time: 10/22/24  7:00 PM  Result Value Ref Range   Troponin T High Sensitivity <15 0 - 19 ng/L    Comment: (NOTE) Biotin concentrations > 1000 ng/mL falsely decrease TnT results.  Serial cardiac troponin measurements are suggested.  Refer to the Links section for chest pain algorithms and additional  guidance. Performed at [facility], [address], [place], [place] [address]     RADIOGRAPHIC STUDIES:  CT Angio Chest PE W and/or Wo Contrast Result Date: 10/22/2024 CLINICAL DATA:  Pulmonary embolism (PE) suspected, high prob Sudden onset of chest pain.  History of  pulmonary embolus. EXAM: CT ANGIOGRAPHY CHEST WITH CONTRAST TECHNIQUE: Multidetector CT imaging of the chest was performed using the standard protocol during bolus administration of intravenous contrast. Multiplanar CT image reconstructions and MIPs were obtained to evaluate the vascular anatomy. RADIATION DOSE REDUCTION: This exam was performed according to the departmental dose-optimization program which includes automated exposure control, adjustment of the mA and/or kV according to patient size and/or use of iterative reconstruction technique. CONTRAST:  75mL OMNIPAQUE  IOHEXOL  350 MG/ML SOLN COMPARISON:  Chest CT 08/22/2024 FINDINGS: Cardiovascular: There are no filling defects within the pulmonary arteries to suggest pulmonary embolus. The thoracic aorta is normal in caliber. There is no contrast in the aorta. The heart is normal in size. No pericardial effusion. Mediastinum/Nodes: No mediastinal or hilar adenopathy. Decompressed esophagus. No visible thyroid  nodule Lungs/Pleura: Clear lungs. No focal airspace disease. No pleural effusion. No features of pulmonary edema. The trachea and central airways are clear. Upper Abdomen: No acute findings. Musculoskeletal: There are no acute or suspicious osseous abnormalities. Slight scoliotic curvature of the spine. Unremarkable chest wall soft tissues Review of the MIP images confirms the above findings. IMPRESSION: No pulmonary embolus or acute intrathoracic abnormality. Electronically Signed   By: Andrea Gasman M.D.   On: 10/22/2024 20:03   US  THYROID  Result Date: 10/18/2024 CLINICAL DATA:  New goiter on physical examination. EXAM: THYROID  ULTRASOUND TECHNIQUE: Ultrasound examination of the thyroid  gland and adjacent soft tissues was performed. COMPARISON:  None available. FINDINGS: Parenchymal Echotexture: Normal Isthmus: 0.4 cm Right lobe: 5.1 x 1.5 x 2.1 cm Left lobe: 4.8 x 1.4 x 1.7 cm _________________________________________________________ Estimated  total number of nodules >/= 1 cm: 0 Number of spongiform nodules >/=  2 cm not described below (TR1): 0 Number of mixed cystic and solid nodules >/= 1.5 cm not described below (TR2): 0 _________________________________________________________ No discrete nodules are seen within the thyroid  gland. IMPRESSION: Normal thyroid  ultrasound The above is in keeping with the ACR TI-RADS recommendations - J Am Coll Radiol 2017;14:587-595. Electronically Signed   By: Aliene Lloyd M.D.   On: 10/18/2024 08:03   XR Lumbar Spine 2-3 Views Result Date: 10/02/2024 2 views of the lumbar spine including AP and lateral femoral ordered and reviewed by myself today.  X-rays demonstrate 5 nonrib-bearing lumbar vertebra.  There is no significant arthritic change or degenerative disc disease.  There is good alignment of the spine without scoliosis.  SI joints unremarkable.  No acute osseous abnormality noted.   ASSESSMENT & PLAN Meghan Reed 38 y.o. 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  She has been taking her iron prescription as prescribed and does not currently have any symptoms of recurrent VTE.     #Pulmonary Embolism in Setting of Pregnancy # Post Operative Pulmonary Embolism  --patient continued lovenox  until 12/12/2019 (6 weeks post partum). Completed full course of anticoagulation -- Patient had a tubal ligation, no anticipated pregnancies in the future.   -- Patient underwent shoulder surgery on 01/22/2024, subsequently developed PE on 01/23/2024. --Recommend 3 to 6 months of anticoagulation therapy.  Plan to see the patient back in 3 months to determine if she requires to her 24-month course. --  Patient notes that she is interested in indefinite anticoagulation given the fact that she is prone to clotting at such young age.  Both blood clots were technically provoked, however if anticoagulation would be reasonable because she does have a proclivity for bleeding. PLAN:  --labs today show white blood cell 5.5, Hgb 14.0, MCV 91.6, Plt 224   --continue eliquis  5 mg BID until we are able to start Xarelto  20 mg p.o. daily. Have been unable to obtain Xarelto  due to pharmacy issues.  Called Xarelto  into alternative pharmacy for her. --Would recommend indefinite anticoagulation moving forward.  Would not reduce to maintenance dose again. --RTC in 3 months.   #Thrombocytopenia s/p Post Partum Hemorrhage, resolved.  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  All questions were answered. The patient knows to call the clinic with any problems, questions or concerns.  A total of more than 30 minutes were spent on this encounter and over half of that time was spent on counseling and coordination of care as outlined above.   Norleen IVAR Kidney, MD Department of Hematology/Oncology [facility] Phone: [phone] Pager: [phone] Email: [email or web address]  10/27/2024 2:20 PM "

## 2024-10-28 ENCOUNTER — Inpatient Hospital Stay: Attending: Hematology and Oncology

## 2024-10-28 ENCOUNTER — Other Ambulatory Visit: Payer: Self-pay | Admitting: Family Medicine

## 2024-10-28 ENCOUNTER — Inpatient Hospital Stay: Admitting: Hematology and Oncology

## 2024-10-28 ENCOUNTER — Ambulatory Visit (HOSPITAL_COMMUNITY)
Admission: RE | Admit: 2024-10-28 | Discharge: 2024-10-28 | Disposition: A | Discharge status: Home / Self Care | Source: Ambulatory Visit | Attending: Surgery | Admitting: Surgery

## 2024-10-28 ENCOUNTER — Ambulatory Visit: Payer: Self-pay | Admitting: Family Medicine

## 2024-10-28 VITALS — BP 110/77 | HR 73 | Temp 97.3°F | Resp 18 | Wt 182.1 lb

## 2024-10-28 DIAGNOSIS — R221 Localized swelling, mass and lump, neck: Secondary | ICD-10-CM | POA: Diagnosis not present

## 2024-10-28 DIAGNOSIS — Z7901 Long term (current) use of anticoagulants: Secondary | ICD-10-CM | POA: Insufficient documentation

## 2024-10-28 DIAGNOSIS — Z86711 Personal history of pulmonary embolism: Secondary | ICD-10-CM | POA: Insufficient documentation

## 2024-10-28 DIAGNOSIS — D500 Iron deficiency anemia secondary to blood loss (chronic): Secondary | ICD-10-CM | POA: Diagnosis not present

## 2024-10-28 DIAGNOSIS — Z79899 Other long term (current) drug therapy: Secondary | ICD-10-CM | POA: Insufficient documentation

## 2024-10-28 DIAGNOSIS — Z832 Family history of diseases of the blood and blood-forming organs and certain disorders involving the immune mechanism: Secondary | ICD-10-CM | POA: Diagnosis not present

## 2024-10-28 DIAGNOSIS — R1310 Dysphagia, unspecified: Secondary | ICD-10-CM

## 2024-10-28 LAB — CBC WITH DIFFERENTIAL (CANCER CENTER ONLY)
Abs Immature Granulocytes: 0.02 K/uL (ref 0.00–0.07)
Basophils Absolute: 0 K/uL (ref 0.0–0.1)
Basophils Relative: 0 %
Eosinophils Absolute: 0.1 K/uL (ref 0.0–0.5)
Eosinophils Relative: 2 %
HCT: 40.5 % (ref 36.0–46.0)
Hemoglobin: 14 g/dL (ref 12.0–15.0)
Immature Granulocytes: 0 %
Lymphocytes Relative: 37 %
Lymphs Abs: 2 K/uL (ref 0.7–4.0)
MCH: 31.7 pg (ref 26.0–34.0)
MCHC: 34.6 g/dL (ref 30.0–36.0)
MCV: 91.6 fL (ref 80.0–100.0)
Monocytes Absolute: 0.4 K/uL (ref 0.1–1.0)
Monocytes Relative: 7 %
Neutro Abs: 3 K/uL (ref 1.7–7.7)
Neutrophils Relative %: 54 %
Platelet Count: 224 K/uL (ref 150–400)
RBC: 4.42 MIL/uL (ref 3.87–5.11)
RDW: 12.4 % (ref 11.5–15.5)
WBC Count: 5.5 K/uL (ref 4.0–10.5)
nRBC: 0 % (ref 0.0–0.2)

## 2024-10-28 LAB — CMP (CANCER CENTER ONLY)
ALT: 13 U/L (ref 0–44)
AST: 15 U/L (ref 15–41)
Albumin: 4.2 g/dL (ref 3.5–5.0)
Alkaline Phosphatase: 37 U/L — ABNORMAL LOW (ref 38–126)
Anion gap: 9 (ref 5–15)
BUN: 10 mg/dL (ref 6–20)
CO2: 27 mmol/L (ref 22–32)
Calcium: 9.1 mg/dL (ref 8.9–10.3)
Chloride: 104 mmol/L (ref 98–111)
Creatinine: 0.83 mg/dL (ref 0.44–1.00)
GFR, Estimated: >60 mL/min
Glucose, Bld: 109 mg/dL — ABNORMAL HIGH (ref 70–99)
Potassium: 3.7 mmol/L (ref 3.5–5.1)
Sodium: 140 mmol/L (ref 135–145)
Total Bilirubin: 0.5 mg/dL (ref 0.0–1.2)
Total Protein: 6.9 g/dL (ref 6.5–8.1)

## 2024-10-28 MED ORDER — RIVAROXABAN 20 MG PO TABS
20.0000 mg | ORAL_TABLET | Freq: Every day | ORAL | 5 refills | Status: AC
  Filled 2024-12-04: qty 30, 30d supply, fill #0

## 2024-10-28 MED ORDER — RIVAROXABAN (XARELTO) VTE STARTER PACK (15 & 20 MG)
ORAL_TABLET | ORAL | 0 refills | Status: DC
  Filled 2024-12-02: qty 51, 30d supply, fill #0

## 2024-10-29 ENCOUNTER — Ambulatory Visit (HOSPITAL_BASED_OUTPATIENT_CLINIC_OR_DEPARTMENT_OTHER)

## 2024-10-29 ENCOUNTER — Encounter (HOSPITAL_BASED_OUTPATIENT_CLINIC_OR_DEPARTMENT_OTHER): Payer: Self-pay

## 2024-10-29 DIAGNOSIS — M25551 Pain in right hip: Secondary | ICD-10-CM | POA: Diagnosis not present

## 2024-10-29 DIAGNOSIS — R262 Difficulty in walking, not elsewhere classified: Secondary | ICD-10-CM

## 2024-10-29 DIAGNOSIS — M6281 Muscle weakness (generalized): Secondary | ICD-10-CM

## 2024-10-29 DIAGNOSIS — M25651 Stiffness of right hip, not elsewhere classified: Secondary | ICD-10-CM
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
## 2024-10-30 ENCOUNTER — Encounter

## 2024-10-31 ENCOUNTER — Encounter: Payer: Self-pay | Admitting: Hematology and Oncology

## 2024-11-01 ENCOUNTER — Encounter: Payer: Self-pay | Admitting: *Deleted

## 2024-11-01 ENCOUNTER — Encounter (HOSPITAL_BASED_OUTPATIENT_CLINIC_OR_DEPARTMENT_OTHER)

## 2024-11-04 ENCOUNTER — Other Ambulatory Visit: Payer: Self-pay | Admitting: Family Medicine

## 2024-11-04 ENCOUNTER — Ambulatory Visit: Admitting: Sports Medicine

## 2024-11-04 ENCOUNTER — Ambulatory Visit: Attending: Internal Medicine | Admitting: Internal Medicine

## 2024-11-04 ENCOUNTER — Encounter: Payer: Self-pay | Admitting: Internal Medicine

## 2024-11-04 VITALS — BP 107/77 | HR 67 | Ht 72.0 in | Wt 179.1 lb

## 2024-11-04 DIAGNOSIS — G4710 Hypersomnia, unspecified: Secondary | ICD-10-CM | POA: Diagnosis not present

## 2024-11-04 DIAGNOSIS — Z86711 Personal history of pulmonary embolism: Secondary | ICD-10-CM | POA: Diagnosis not present

## 2024-11-04 DIAGNOSIS — I471 Supraventricular tachycardia, unspecified: Secondary | ICD-10-CM | POA: Insufficient documentation

## 2024-11-04 DIAGNOSIS — R002 Palpitations: Secondary | ICD-10-CM | POA: Diagnosis not present

## 2024-11-04 NOTE — Progress Notes (Signed)
 " Cardiology Office Note:  .   Date:  11/04/2024  ID:  Meghan Reed, DOB [birth year], MRN [id number] PCP: Patient, No Pcp Per  [facility] Providers Cardiologist:  Emeline FORBES Calender, DO    History of Present Illness: Meghan Reed   Meghan Reed is a 39 y.o. female Discussed the use of AI scribe software for clinical note transcription with the patient, who gave verbal consent to proceed.  History of Present Illness Meghan Reed is a 39 year old female who works in primary care as an MA with a history of pulmonary embolism and palpitations, PACs and PVCs who presents with worsening palpitations and dizziness.  Palpitations and dizziness - Worsening palpitations over the past month, associated with dizziness - Episodes of low blood pressure, with readings as low as 96/60 mmHg, occurring randomly and accompanied by dizziness - Symptoms can occur randomly throughout the day - Palpitations improve with rest but cause anxiety during prolonged exertion - June monitor indicated a brief run of supraventricular tachycardia (SVT) and rare PACs and PVCs  Chest discomfort - Pinching sensation in the chest that takes her breath away - Chest discomfort sometimes accompanied by a racing heart - Not associated with exertion  - Family history of CAD in father - Exercise treadmill stress test on January 11, 2022, showed no ST deviation or arrhythmias; ECG negative  Fatigue and weakness - Significant fatigue and weakness during episodes of palpitations and dizziness - Often feels the need to sleep to alleviate symptoms  Sleep disturbance - Poor sleep quality due to insomnia and nightmares, attributed to past trauma - Occasional marijuana use to aid sleep - No history of sleep apnea, but reports snoring and poor sleep patterns  Anticoagulation therapy and pulmonary embolism history - History of pulmonary embolism in 2020 during pregnancy and recurrence in  March 2025 after shoulder surgery - Currently on Eliquis  due to difficulty obtaining Xarelto  - No missed doses of anticoagulation medication - Recent chest CT negative for acute pulmonary embolism        ROS: No other complaints  Studies Reviewed: .        Results Radiology Chest CT (10/22/2024): No evidence of acute pulmonary embolism or aortic dissection  Diagnostic Exercise treadmill stress test (01/11/2022): No ST segment deviation, no arrhythmias, negative ECG Cardiac monitor (03/2024): Bradycardia, tachycardia, rare premature atrial contractions, rare premature ventricular contractions, brief run of supraventricular tachycardia, triggered events Echocardiogram (01/23/2024): Ejection fraction 50-55%, no wall motion abnormalities, normal diastolic function, normal right ventricular function, mildly enlarged right ventricle Risk Assessment/Calculations:         STOP-Bang Score:  2      Physical Exam:   VS:  BP 107/77 (BP Location: Right Arm, Patient Position: Sitting)   Pulse 67   Ht 6' (1.829 m)   Wt 179 lb 1.6 oz (81.2 kg)   LMP  (LMP Unknown)   SpO2 99%   BMI 24.29 kg/m    Wt Readings from Last 3 Encounters:  11/04/24 179 lb 1.6 oz (81.2 kg)  10/28/24 182 lb 1.6 oz (82.6 kg)  10/22/24 185 lb (83.9 kg)    GEN: Well nourished, well developed in no acute distress NECK: No JVD; No carotid bruits CARDIAC:  RRR, no murmurs, no rubs, no gallops RESPIRATORY:  Clear to auscultation without rales, wheezing or rhonchi  ABDOMEN: Soft, non-tender, non-distended EXTREMITIES:  No edema; No deformity   ASSESSMENT AND PLAN: .    Assessment and Plan Assessment & Plan  Paroxysmal supraventricular tachycardia with premature beats Intermittent palpitations with episodes of dizziness and chest discomfort. Recent cardiac monitor showed brief run of SVT and rare PACs/PVCs.  Symptoms during cardiac monitoring occurred during sinus rhythm however her symptoms have increased in  frequency over the past month. Blood pressure drops during episodes, raising concerns about starting beta blockers due to potential for further hypotension. - Held off on starting beta blockers due to risk of hypotension. - Increase water and salt intake to help maintain blood pressure.  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 Currently on Eliquis  (apixaban ) for anticoagulation. No recent PE detected on chest CT. - Continue Eliquis  (apixaban ) for anticoagulation.  Episodic hypotension of unclear etiology Episodes of dizziness and low blood pressure, particularly during palpitations. Blood pressure drops to 96/60 mmHg during episodes.   - Increase salt intake to help prevent blood pressure drops. - Ensure adequate hydration. - Testing as outlined - Avoiding beta-blockers for now  Sleep disturbance with hypersomnia and possible sleep apnea Reports of snoring and daytime fatigue. Possible sleep apnea suggested by STOP-BANG score of 2. Symptoms include waking up with racing heart and feeling weak during the day. No known history of sleep apnea diagnosis. - Ordered home sleep study to evaluate for sleep apnea. - Increase water and salt intake to help maintain blood pressure.            Follow up: 4 to 6 weeks  Signed, Isiac Breighner E Dao Memmott, DO  11/04/2024 3:56 PM    [facility] "

## 2024-11-04 NOTE — Patient Instructions (Addendum)
 Medication Instructions:  Your physician recommends that you continue on your current medications as directed. Please refer to the Current Medication list given to you today.  *If you need a refill on your cardiac medications before your next appointment, please call your pharmacy*  Lab Work: none If you have labs (blood work) drawn today and your tests are completely normal, you will receive your results only by: MyChart Message (if you have MyChart) OR A paper copy in the mail If you have any lab test that is abnormal or we need to change your treatment, we will call you to review the results.  Testing/Procedures: Your physician has recommended that you have a sleep study. This test records several body functions during sleep, including: brain activity, eye movement, oxygen and carbon dioxide blood levels, heart rate and rhythm, breathing rate and rhythm, the flow of air through your mouth and nose, snoring, body muscle movements, and chest and belly movement.   Follow-Up: At [facility], you and your health needs are our priority.  As part of our continuing mission to provide you with exceptional heart care, our providers are all part of one team.  This team includes your primary Cardiologist (physician) and Advanced Practice Providers or APPs (Physician Assistants and Nurse Practitioners) who all work together to provide you with the care you need, when you need it.  Your next appointment:   3 month(s)  Provider:   Emeline FORBES Calender, DO or APP    We recommend signing up for the patient portal called MyChart.  Sign up information is provided on this After Visit Summary.  MyChart is used to connect with patients for Virtual Visits (Telemedicine).  Patients are able to view lab/test results, encounter notes, upcoming appointments, etc.  Non-urgent messages can be sent to your provider as well.   To learn more about what you can do with MyChart, go to [email or web address].    Other Instructions Increase water and salt intake

## 2024-11-05 ENCOUNTER — Other Ambulatory Visit: Payer: Self-pay | Admitting: Family Medicine

## 2024-11-05 ENCOUNTER — Ambulatory Visit: Admitting: Family Medicine

## 2024-11-05 ENCOUNTER — Encounter: Payer: Self-pay | Admitting: Family Medicine

## 2024-11-05 VITALS — BP 98/62 | HR 106 | Temp 98.3°F | Ht 66.0 in | Wt 179.0 lb

## 2024-11-05 DIAGNOSIS — R1310 Dysphagia, unspecified: Secondary | ICD-10-CM | POA: Diagnosis not present

## 2024-11-05 DIAGNOSIS — I2699 Other pulmonary embolism without acute cor pulmonale: Secondary | ICD-10-CM

## 2024-11-05 DIAGNOSIS — J4540 Moderate persistent asthma, uncomplicated: Secondary | ICD-10-CM

## 2024-11-05 DIAGNOSIS — Z7901 Long term (current) use of anticoagulants: Secondary | ICD-10-CM | POA: Diagnosis not present

## 2024-11-05 DIAGNOSIS — I959 Hypotension, unspecified: Secondary | ICD-10-CM

## 2024-11-05 DIAGNOSIS — D6869 Other thrombophilia: Secondary | ICD-10-CM

## 2024-11-05 DIAGNOSIS — R221 Localized swelling, mass and lump, neck: Secondary | ICD-10-CM

## 2024-11-05 DIAGNOSIS — Z86718 Personal history of other venous thrombosis and embolism: Secondary | ICD-10-CM

## 2024-11-05 DIAGNOSIS — I471 Supraventricular tachycardia, unspecified: Secondary | ICD-10-CM

## 2024-11-05 DIAGNOSIS — R002 Palpitations: Secondary | ICD-10-CM | POA: Diagnosis not present

## 2024-11-05 NOTE — Progress Notes (Addendum)
 "  Acute Office Visit  Subjective:     Patient ID: Meghan Reed, female    DOB: [birth year], 39 y.o.   MRN: [id number]  No chief complaint on file.   HPI  Discussed the use of AI scribe software for clinical note transcription with the patient, who gave verbal consent to proceed.  History of Present Illness Meghan Reed is a 39 year old female with asthma, pulmonary embolism, and deep vein thrombosis who presents with bilateral neck swelling.  Neck swelling - Bilateral neck swelling for 1 month, worse on the left side - Swelling fluctuates from mild to moderate in severity - Interferes with swallowing at times - Thyroid  ultrasound was negative  Respiratory symptoms - Shortness of breath, unclear if related to neck swelling or asthma - History of asthma  Cardiovascular symptoms - Worsening palpitations and dizziness over the past month - Low blood pressures in the 90s/60s - Fatigue and weakness - Prior cardiac monitoring in June 2025 showed paroxysmal events with premature beats, usually symptomatic  Thromboembolic disease - History of pulmonary embolism and deep vein thrombosis - Takes Xarelto  20 mg once daily  Sleep disturbance evaluation - Home sleep study is pending     ROS Per HPI      Objective:    BP 98/62 (BP Location: Right Arm, Patient Position: Sitting)   Pulse (!) 106   Temp 98.3 F (36.8 C) (Oral)   Ht 5' 6 (1.676 m)   Wt 179 lb (81.2 kg)   LMP  (LMP Unknown)   SpO2 99%   BMI 28.89 kg/m    Physical Exam Vitals and nursing note reviewed.  Constitutional:      General: She is not in acute distress. HENT:     Head: Normocephalic and atraumatic.     Right Ear: External ear normal.     Left Ear: External ear normal.     Nose: Nose normal.     Mouth/Throat:     Mouth: Mucous membranes are moist.     Pharynx: Oropharynx is clear.  Eyes:     Extraocular Movements: Extraocular movements intact.      Pupils: Pupils are equal, round, and reactive to light.  Neck:     Vascular: Carotid bruit (L side) present.      Comments: Area of swelling, no discrete nodules noted, no erythema, no bruising, non tender Cardiovascular:     Rate and Rhythm: Normal rate and regular rhythm.     Pulses: Normal pulses.     Heart sounds: Normal heart sounds.  Pulmonary:     Effort: Pulmonary effort is normal. No respiratory distress.     Breath sounds: Normal breath sounds. No wheezing, rhonchi or rales.  Musculoskeletal:        General: Normal range of motion.     Cervical back: Normal range of motion. Edema present.     Right lower leg: No edema.     Left lower leg: No edema.  Lymphadenopathy:     Cervical: No cervical adenopathy.  Neurological:     General: No focal deficit present.     Mental Status: She is alert and oriented to person, place, and time.  Psychiatric:        Mood and Affect: Mood normal.        Thought Content: Thought content normal.     No results found for any visits on 11/05/24.      Assessment & Plan:   Assessment and Plan Assessment &  Plan Mass of neck, Neck swelling with dysphagia Bilateral neck swelling, left greater than right, causing dysphagia and shortness of breath. Negative thyroid  ultrasound. Soft tissue CT of the neck needed for further evaluation. - No tracheal deviation - Ordered soft tissue CT of the neck.  Paroxysmal SVT with palpitations and hypotension Worsening palpitations and dizziness with hypotension. History of paroxysmal arrhythmia with premature beats. Cardiac monitoring showed premature beats. Awaiting home sleep study results.  Moderate persistent asthma, uncomplicated History of asthma. - chronic, currently stable - continue inhalers for maintenance and exacerbations  History of PE, venous thromboembolism, chronic anticoagulation, secondary hypercoagulable state History of pulmonary embolism and deep vein thrombosis. On Xarelto  20 mg  once daily.     Orders Placed This Encounter  Procedures   CT SOFT TISSUE NECK W CONTRAST    Standing Status:   Future    Expiration Date:   11/05/2025    If indicated for the ordered procedure, I authorize the administration of contrast media per Radiology protocol:   Yes    Is patient pregnant?:   No    Preferred imaging location?:   [facility]     No orders of the defined types were placed in this encounter.   Return if symptoms worsen or fail to improve.  Corean LITTIE Ku, FNP  "

## 2024-11-05 NOTE — Patient Instructions (Signed)
 I ordered a CT scan for you today for further evaluation of the swelling in your neck.  Somebody should be reaching out to get you scheduled for this.  Will be in contact once results are received.

## 2024-11-06 ENCOUNTER — Ambulatory Visit
Admission: RE | Admit: 2024-11-06 | Discharge: 2024-11-06 | Disposition: A | Discharge status: Home / Self Care | Source: Ambulatory Visit | Attending: Family Medicine | Admitting: Family Medicine

## 2024-11-06 ENCOUNTER — Ambulatory Visit: Payer: Self-pay | Admitting: Family Medicine

## 2024-11-06 DIAGNOSIS — D6869 Other thrombophilia: Secondary | ICD-10-CM

## 2024-11-06 DIAGNOSIS — R1310 Dysphagia, unspecified: Secondary | ICD-10-CM

## 2024-11-06 DIAGNOSIS — I959 Hypotension, unspecified: Secondary | ICD-10-CM

## 2024-11-06 DIAGNOSIS — R221 Localized swelling, mass and lump, neck: Secondary | ICD-10-CM

## 2024-11-06 DIAGNOSIS — Z86718 Personal history of other venous thrombosis and embolism: Secondary | ICD-10-CM

## 2024-11-06 DIAGNOSIS — I2699 Other pulmonary embolism without acute cor pulmonale: Secondary | ICD-10-CM

## 2024-11-06 DIAGNOSIS — Z7901 Long term (current) use of anticoagulants: Secondary | ICD-10-CM

## 2024-11-06 MED ORDER — IOPAMIDOL (ISOVUE-300) INJECTION 61%
75.0000 mL | Freq: Once | INTRAVENOUS | Status: AC | PRN
  Administered 2024-11-06: 75 mL via INTRAVENOUS

## 2024-11-06 NOTE — Progress Notes (Signed)
Discussed results in person.

## 2024-11-07 ENCOUNTER — Other Ambulatory Visit: Payer: Self-pay | Admitting: Family Medicine

## 2024-11-07 DIAGNOSIS — R221 Localized swelling, mass and lump, neck: Secondary | ICD-10-CM

## 2024-11-07 DIAGNOSIS — R0989 Other specified symptoms and signs involving the circulatory and respiratory systems: Secondary | ICD-10-CM

## 2024-11-11 ENCOUNTER — Ambulatory Visit: Attending: Family Medicine

## 2024-11-11 ENCOUNTER — Other Ambulatory Visit: Payer: Self-pay | Admitting: Family Medicine

## 2024-11-11 DIAGNOSIS — R002 Palpitations: Secondary | ICD-10-CM

## 2024-11-11 NOTE — Progress Notes (Unsigned)
 EP to read.

## 2024-11-12 ENCOUNTER — Ambulatory Visit (HOSPITAL_BASED_OUTPATIENT_CLINIC_OR_DEPARTMENT_OTHER): Attending: Orthopaedic Surgery

## 2024-11-12 ENCOUNTER — Encounter (HOSPITAL_BASED_OUTPATIENT_CLINIC_OR_DEPARTMENT_OTHER): Payer: Self-pay

## 2024-11-12 DIAGNOSIS — M25551 Pain in right hip: Secondary | ICD-10-CM | POA: Insufficient documentation

## 2024-11-12 DIAGNOSIS — M5459 Other low back pain: Secondary | ICD-10-CM | POA: Insufficient documentation

## 2024-11-12 DIAGNOSIS — M6281 Muscle weakness (generalized): Secondary | ICD-10-CM | POA: Insufficient documentation

## 2024-11-12 DIAGNOSIS — R262 Difficulty in walking, not elsewhere classified: Secondary | ICD-10-CM | POA: Diagnosis present

## 2024-11-12 DIAGNOSIS — M25651 Stiffness of right hip, not elsewhere classified: Secondary | ICD-10-CM | POA: Diagnosis present
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
## 2024-11-19 ENCOUNTER — Encounter (HOSPITAL_BASED_OUTPATIENT_CLINIC_OR_DEPARTMENT_OTHER): Payer: Self-pay | Admitting: Physical Therapy

## 2024-11-19 ENCOUNTER — Ambulatory Visit (HOSPITAL_BASED_OUTPATIENT_CLINIC_OR_DEPARTMENT_OTHER): Payer: Self-pay | Admitting: Physical Therapy

## 2024-11-19 DIAGNOSIS — R262 Difficulty in walking, not elsewhere classified: Secondary | ICD-10-CM

## 2024-11-19 DIAGNOSIS — M5459 Other low back pain: Secondary | ICD-10-CM

## 2024-11-19 DIAGNOSIS — M25551 Pain in right hip: Secondary | ICD-10-CM

## 2024-11-19 DIAGNOSIS — M6281 Muscle weakness (generalized): Secondary | ICD-10-CM

## 2024-11-19 DIAGNOSIS — M25651 Stiffness of right hip, not elsewhere classified: Secondary | ICD-10-CM
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
## 2024-11-21 ENCOUNTER — Ambulatory Visit (HOSPITAL_BASED_OUTPATIENT_CLINIC_OR_DEPARTMENT_OTHER): Admitting: Orthopaedic Surgery

## 2024-11-21 DIAGNOSIS — M25551 Pain in right hip: Secondary | ICD-10-CM | POA: Diagnosis not present
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
## 2024-11-27 ENCOUNTER — Inpatient Hospital Stay

## 2024-11-27 ENCOUNTER — Inpatient Hospital Stay: Admitting: Hematology and Oncology

## 2024-11-28 ENCOUNTER — Other Ambulatory Visit: Payer: Self-pay

## 2024-11-28 ENCOUNTER — Other Ambulatory Visit (HOSPITAL_COMMUNITY)
Admission: RE | Admit: 2024-11-28 | Discharge: 2024-11-28 | Disposition: A | Source: Ambulatory Visit | Attending: Family Medicine | Admitting: Family Medicine

## 2024-11-28 DIAGNOSIS — N898 Other specified noninflammatory disorders of vagina: Secondary | ICD-10-CM | POA: Diagnosis present

## 2024-11-29 ENCOUNTER — Other Ambulatory Visit (HOSPITAL_COMMUNITY): Payer: Self-pay

## 2024-11-29 ENCOUNTER — Ambulatory Visit: Payer: Self-pay | Admitting: Family Medicine

## 2024-11-29 ENCOUNTER — Encounter: Payer: Self-pay | Admitting: Family Medicine

## 2024-11-29 LAB — CERVICOVAGINAL ANCILLARY ONLY
Bacterial Vaginitis (gardnerella): POSITIVE — AB
Candida Glabrata: NEGATIVE
Candida Vaginitis: NEGATIVE
Chlamydia: NEGATIVE
Comment: NEGATIVE
Comment: NEGATIVE
Comment: NEGATIVE
Comment: NEGATIVE
Comment: NEGATIVE
Comment: NORMAL
Neisseria Gonorrhea: NEGATIVE
Trichomonas: NEGATIVE

## 2024-11-29 MED ORDER — METRONIDAZOLE 500 MG PO TABS
500.0000 mg | ORAL_TABLET | Freq: Two times a day (BID) | ORAL | 0 refills | Status: AC
  Filled 2024-11-29: qty 14, 7d supply, fill #0

## 2024-11-29 MED ORDER — FLUCONAZOLE 150 MG PO TABS
ORAL_TABLET | ORAL | 0 refills | Status: AC
  Filled 2024-11-29: qty 2, 3d supply, fill #0

## 2024-12-02 ENCOUNTER — Other Ambulatory Visit (HOSPITAL_COMMUNITY): Payer: Self-pay

## 2024-12-04 ENCOUNTER — Encounter: Payer: Self-pay | Admitting: Internal Medicine

## 2024-12-04 ENCOUNTER — Ambulatory Visit: Admitting: Internal Medicine

## 2024-12-04 ENCOUNTER — Ambulatory Visit (INDEPENDENT_AMBULATORY_CARE_PROVIDER_SITE_OTHER)

## 2024-12-04 ENCOUNTER — Other Ambulatory Visit (HOSPITAL_COMMUNITY): Payer: Self-pay

## 2024-12-04 VITALS — BP 114/68 | HR 120 | Temp 98.2°F | Ht 66.0 in | Wt 177.0 lb

## 2024-12-04 DIAGNOSIS — M25551 Pain in right hip: Secondary | ICD-10-CM

## 2024-12-04 DIAGNOSIS — W19XXXA Unspecified fall, initial encounter: Secondary | ICD-10-CM

## 2024-12-04 DIAGNOSIS — M545 Low back pain, unspecified: Secondary | ICD-10-CM

## 2024-12-04 MED ORDER — HYDROCODONE-ACETAMINOPHEN 7.5-325 MG PO TABS
1.0000 | ORAL_TABLET | Freq: Four times a day (QID) | ORAL | 0 refills | Status: AC | PRN
  Filled 2024-12-04: qty 20, 5d supply, fill #0

## 2024-12-04 MED ORDER — KETOROLAC TROMETHAMINE 60 MG/2ML IM SOLN
60.0000 mg | Freq: Once | INTRAMUSCULAR | Status: AC
  Administered 2024-12-04: 60 mg via INTRAMUSCULAR

## 2024-12-04 NOTE — Progress Notes (Signed)
 "   Subjective:    Patient ID: Meghan Reed, female    DOB: 03/13/1986, 39 y.o.   MRN: [id number]      HPI [name] is here for  Chief Complaint  Patient presents with   Fall    Patient fell on ice today (Lower back and tailbone)    Discussed the use of AI scribe software for clinical note transcription with the patient, who gave verbal consent to proceed.  History of Present Illness Meghan Reed is a 39 year old female who presents with pain following a fall on black ice.  She fell on black ice at home, landing on her tailbone and right side. The pain is located in the lower back, upper back, tailbone, and hip, with radiation down the leg. Certain movements exacerbate the pain, causing shooting pain down the leg and into the vaginal area. Standing is more comfortable than sitting, which increases the pain.  She recently underwent hip surgery and is concerned about the impact of the fall on her surgical site. She experiences pain in the hip area, describing it as deeper rather than superficial.  She did not attempt to break her fall with her arms to avoid further injury. No numbness or tingling is present, but she experiences shooting nerve pain down the legs.  She is currently on Xarelto  20 mg daily for blood thinning and is concerned about bruising due to the medication. Her current medications include a muscle relaxer, which she plans to take at night due to its drowsy effects. She tolerates hydrocodone  and acetaminophen  well for pain management, having previously used a 7.5 mg dose without issues. She also takes medication for constipation due to iron supplements.       Medications and allergies reviewed with patient and updated if appropriate.  Medications Ordered Prior to Encounter[1]  Review of Systems     Objective:   Vitals:   12/04/24 0752  BP: 114/68  Pulse: (!) 120  Temp: 98.2 F (36.8 C)  SpO2: 98%   BP Readings from Last  3 Encounters:  12/04/24 114/68  11/05/24 98/62  11/04/24 107/77   Wt Readings from Last 3 Encounters:  12/04/24 177 lb (80.3 kg)  11/05/24 179 lb (81.2 kg)  11/04/24 179 lb 1.6 oz (81.2 kg)   Body mass index is 28.57 kg/m.    Physical Exam Constitutional:      General: She is not in acute distress.    Appearance: Normal appearance. She is not ill-appearing.  HENT:     Head: Normocephalic and atraumatic.  Musculoskeletal:        General: Tenderness (Tenderness lower lumbar spine, across the lower back especially on the right side, right upper buttock, buttock region) present. No swelling or deformity.     Right lower leg: No edema.     Left lower leg: No edema.  Skin:    General: Skin is warm and dry.  Neurological:     Mental Status: She is alert.     Sensory: No sensory deficit.       DG Lumbar Spine 2-3 Views CLINICAL DATA:  Fall, low back and right hip pain.  EXAM: LUMBAR SPINE - 2-3 VIEW  COMPARISON:  10/02/2024.  FINDINGS: Alignment is anatomic. Vertebral body and disc space heights are maintained. Suspect L5 pars defects without alignment abnormality. Finding is new from 10/02/2024. Facet hypertrophy at L5-S1.  IMPRESSION: 1. 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 If further evaluation is desired, CT  lumbar spine without contrast is recommended. 2. L5-S1 facet degenerative changes.  Electronically Signed   By: Newell Eke M.D.   On: 12/04/2024 09:47 DG HIP UNILAT W OR W/O PELVIS 2-3 VIEWS RIGHT CLINICAL DATA:  Fall, right hip and low back pain.  EXAM: DG HIP (WITH OR WITHOUT PELVIS) 2-3V RIGHT  COMPARISON:  09/17/2024.  FINDINGS: No acute osseous or joint abnormality. No degenerative changes. Sacroiliac joints are patent. Tubal ligation clip on the right.  IMPRESSION: Negative.  Electronically Signed   By: Newell Eke M.D.   On: 12/04/2024 09:44       Assessment & Plan:    See Problem List for  Assessment and Plan of chronic medical problems.   Assessment and Plan Assessment & Plan Acute traumatic pain of right hip, lower back, and sacral region Acute pain post-fall on black ice, affecting right hip, lower back, sacral region. Movement exacerbates pain, with shooting pain down leg suggesting nerve involvement. Differential includes bone contusion, soft tissue injury, potential nerve irritation.  Pain management prioritized. Discussed bruising and hematoma risk due to anticoagulation. - Ordered pelvic, hip, and lumbar spine x-rays to rule out fractures. - Administered 60 mg Toradol  injection for pain management. - Advised use of heating pads and ice packs for pain relief and swelling reduction. - Encouraged movement to prevent stiffness. - Instructed to take muscle relaxer (Flexeril ) at home as needed. - Prescribed hydrocodone -acetaminophen  7.5-325 mg every 6 hours as needed for pain management. - Advised to monitor for bruising and hematoma formation.  Bleeding risk due to anticoagulation and trauma Increased bleeding risk from trauma and Xarelto . Discussed potential for deep bruising or hematoma, exacerbated by anticoagulant. - Advised to hold Xarelto  for one night to reduce bleeding risk. - Instructed to monitor for signs of significant bruising or hematoma. - Advised use of ice packs to reduce hematoma formation for the first 24 hours and then can switch to heat.        [1]  Current Outpatient Medications on File Prior to Visit  Medication Sig Dispense Refill   albuterol  (PROVENTIL ) (2.5 MG/3ML) 0.083% nebulizer solution Take 3 mLs (2.5 mg total) by nebulization every 6 (six) hours as needed for wheezing or shortness of breath. 180 mL 1   albuterol  (VENTOLIN  HFA) 108 (90 Base) MCG/ACT inhaler Inhale 2 puffs into the lungs every 4 (four) hours as needed for wheezing or shortness of breath. 18 g 6   azelastine  (ASTELIN ) 0.1 % nasal spray Place 1 spray into both nostrils 2  (two) times daily. Use in each nostril as directed 30 mL 12   budesonide -formoterol  (SYMBICORT ) 160-4.5 MCG/ACT inhaler Inhale 2 puffs into the lungs 2 (two) times daily. 10.2 g 2   cetirizine  (ZYRTEC ) 10 MG tablet Take 1 tablet (10 mg total) by mouth daily. 90 tablet 3   cyclobenzaprine  (FLEXERIL ) 10 MG tablet Take 1 tablet (10 mg total) by mouth every 8 (eight) hours as needed for muscle spasms. 60 tablet 0   ferrous sulfate  (FEROSUL) 325 (65 FE) MG tablet Take 1 tablet (325 mg total) by mouth daily with breakfast. Please take with a source of Vitamin C 90 tablet 3   fluconazole  (DIFLUCAN ) 150 MG tablet Take 150mg  tablet once, if symptoms are still present in 3 days, may take the second tablet. 2 tablet 0   Galcanezumab -gnlm (EMGALITY ) 120 MG/ML SOAJ Inject 120 mg into the skin every 30 days. 3 mL 3   metroNIDAZOLE  (FLAGYL ) 500 MG tablet Take 1 tablet (500  mg total) by mouth 2 (two) times daily for 7 days. 14 tablet 0   omeprazole  (PRILOSEC ) 20 MG capsule Take 1 capsule (20 mg total) by mouth daily. 90 capsule 2   ondansetron  (ZOFRAN -ODT) 4 MG disintegrating tablet Take 1 tablet (4 mg total) by mouth every 8 (eight) hours as needed for nausea or vomiting. 12 tablet 0   Rimegepant Sulfate  (NURTEC) 75 MG TBDP Dissolve 1 tablet (75 mg total) by mouth daily as needed (take for abortive therapy of migraine, no more than 1 tablet in 24 hours or 10 per month). 8 tablet 11   rivaroxaban  (XARELTO ) 20 MG TABS tablet Take 1 tablet (20 mg total) by mouth daily with supper. Begin taking after completing starter pack. 30 tablet 5   RIVAROXABAN  (XARELTO ) VTE STARTER PACK (15 & 20 MG) Follow package directions: Take one 15mg  tablet by mouth twice a day. On day 22, switch to one 20mg  tablet once a day. Take with food. After completed fill the 20 mg prescription. 51 each 0   Vitamin D , Ergocalciferol , (DRISDOL ) 1.25 MG (50000 UNIT) CAPS capsule Take 1 capsule (50,000 Units total) by mouth every 7 (seven) days. 8  capsule 0   No current facility-administered medications on file prior to visit.   "

## 2024-12-04 NOTE — Patient Instructions (Addendum)
" ° °  Toradol  60 mg given.    Have xrays today    Medications changes include :   vicodin 7.5 - 325   Use the muscle relaxer tonight.     Use ice, heat.      "

## 2025-01-20 ENCOUNTER — Inpatient Hospital Stay

## 2025-01-20 ENCOUNTER — Inpatient Hospital Stay: Admitting: Hematology and Oncology

## 2025-02-03 ENCOUNTER — Ambulatory Visit: Admitting: Emergency Medicine
